# Patient Record
Sex: Male | Born: 1955 | Race: White | Hispanic: No | Marital: Single | Smoking: Current every day smoker
Health system: Southern US, Community
[De-identification: ages and names within clinical notes are randomized; demographics above are authoritative.]

## PROBLEM LIST (undated history)

## (undated) DIAGNOSIS — J449 Chronic obstructive pulmonary disease, unspecified: Secondary | ICD-10-CM

## (undated) DIAGNOSIS — R0602 Shortness of breath: Secondary | ICD-10-CM

## (undated) DIAGNOSIS — I10 Essential (primary) hypertension: Secondary | ICD-10-CM

## (undated) DIAGNOSIS — F1010 Alcohol abuse, uncomplicated: Secondary | ICD-10-CM

## (undated) DIAGNOSIS — K5792 Diverticulitis of intestine, part unspecified, without perforation or abscess without bleeding: Secondary | ICD-10-CM

## (undated) DIAGNOSIS — I739 Peripheral vascular disease, unspecified: Secondary | ICD-10-CM

## (undated) DIAGNOSIS — T884XXA Failed or difficult intubation, initial encounter: Secondary | ICD-10-CM

## (undated) DIAGNOSIS — H9190 Unspecified hearing loss, unspecified ear: Secondary | ICD-10-CM

## (undated) DIAGNOSIS — F1910 Other psychoactive substance abuse, uncomplicated: Secondary | ICD-10-CM

## (undated) DIAGNOSIS — E871 Hypo-osmolality and hyponatremia: Secondary | ICD-10-CM

## (undated) DIAGNOSIS — F419 Anxiety disorder, unspecified: Secondary | ICD-10-CM

## (undated) DIAGNOSIS — I2510 Atherosclerotic heart disease of native coronary artery without angina pectoris: Secondary | ICD-10-CM

## (undated) DIAGNOSIS — I469 Cardiac arrest, cause unspecified: Secondary | ICD-10-CM

## (undated) DIAGNOSIS — E46 Unspecified protein-calorie malnutrition: Secondary | ICD-10-CM

## (undated) DIAGNOSIS — Z720 Tobacco use: Secondary | ICD-10-CM

## (undated) DIAGNOSIS — Z8701 Personal history of pneumonia (recurrent): Secondary | ICD-10-CM

## (undated) DIAGNOSIS — C61 Malignant neoplasm of prostate: Secondary | ICD-10-CM

## (undated) HISTORY — PX: HERNIA REPAIR: SHX51

## (undated) HISTORY — PX: TRACHEOSTOMY CLOSURE: SHX458

## (undated) HISTORY — PX: COLON SURGERY: SHX602

---

## 2004-04-27 ENCOUNTER — Inpatient Hospital Stay (HOSPITAL_COMMUNITY): Admission: EM | Admit: 2004-04-27 | Discharge: 2004-04-29 | Payer: Self-pay | Admitting: Emergency Medicine

## 2006-09-12 ENCOUNTER — Emergency Department (HOSPITAL_COMMUNITY): Admission: EM | Admit: 2006-09-12 | Discharge: 2006-09-13 | Payer: Self-pay | Admitting: Emergency Medicine

## 2009-11-30 ENCOUNTER — Inpatient Hospital Stay (HOSPITAL_COMMUNITY): Admission: EM | Admit: 2009-11-30 | Discharge: 2009-12-02 | Payer: Self-pay | Admitting: Emergency Medicine

## 2011-01-10 LAB — DIFFERENTIAL
Basophils Relative: 0 % (ref 0–1)
Eosinophils Absolute: 0 10*3/uL (ref 0.0–0.7)
Lymphs Abs: 0.7 10*3/uL (ref 0.7–4.0)
Lymphs Abs: 0.8 10*3/uL (ref 0.7–4.0)
Monocytes Absolute: 1 10*3/uL (ref 0.1–1.0)
Monocytes Relative: 13 % — ABNORMAL HIGH (ref 3–12)
Neutro Abs: 5.9 10*3/uL (ref 1.7–7.7)
Neutro Abs: 6.7 10*3/uL (ref 1.7–7.7)
Neutrophils Relative %: 77 % (ref 43–77)
Neutrophils Relative %: 81 % — ABNORMAL HIGH (ref 43–77)

## 2011-01-10 LAB — POCT I-STAT, CHEM 8
BUN: 5 mg/dL — ABNORMAL LOW (ref 6–23)
Calcium, Ion: 1.07 mmol/L — ABNORMAL LOW (ref 1.12–1.32)
Chloride: 100 mEq/L (ref 96–112)
Creatinine, Ser: 0.9 mg/dL (ref 0.4–1.5)
Glucose, Bld: 142 mg/dL — ABNORMAL HIGH (ref 70–99)
HCT: 48 % (ref 39.0–52.0)
Potassium: 4.6 mEq/L (ref 3.5–5.1)

## 2011-01-10 LAB — CBC
HCT: 39.8 % (ref 39.0–52.0)
Hemoglobin: 13.7 g/dL (ref 13.0–17.0)
Hemoglobin: 15.2 g/dL (ref 13.0–17.0)
MCHC: 34.5 g/dL (ref 30.0–36.0)
MCHC: 34.8 g/dL (ref 30.0–36.0)
RBC: 4.13 MIL/uL — ABNORMAL LOW (ref 4.22–5.81)
RBC: 4.61 MIL/uL (ref 4.22–5.81)
WBC: 7.7 10*3/uL (ref 4.0–10.5)

## 2011-01-10 LAB — COMPREHENSIVE METABOLIC PANEL
ALT: 27 U/L (ref 0–53)
Alkaline Phosphatase: 55 U/L (ref 39–117)
BUN: 6 mg/dL (ref 6–23)
CO2: 29 mEq/L (ref 19–32)
Calcium: 8.7 mg/dL (ref 8.4–10.5)
GFR calc non Af Amer: >60 mL/min (ref >60)
Glucose, Bld: 124 mg/dL — ABNORMAL HIGH (ref 70–99)
Sodium: 135 mEq/L (ref 135–145)

## 2011-01-10 LAB — CULTURE, BLOOD (ROUTINE X 2)
Culture: NO GROWTH
Culture: NO GROWTH

## 2011-01-10 LAB — CULTURE, RESPIRATORY W GRAM STAIN: Culture: NORMAL

## 2011-01-10 LAB — EXPECTORATED SPUTUM ASSESSMENT W GRAM STAIN, RFLX TO RESP C

## 2011-01-10 LAB — MAGNESIUM: Magnesium: 2.1 mg/dL (ref 1.5–2.5)

## 2011-01-10 LAB — PHOSPHORUS: Phosphorus: 3.5 mg/dL (ref 2.3–4.6)

## 2011-03-09 NOTE — Op Note (Signed)
NAME:  Zachary Walls, Zachary Walls                            ACCOUNT NO.:  [id number]   MEDICAL RECORD NO.:  [id number]                   PATIENT TYPE:  INP   LOCATION:  1823                                 FACILITY:  [facility]   PHYSICIAN:  Gabrielle Dare. Janee Morn, M.D.             DATE OF BIRTH:  [birth year]   DATE OF PROCEDURE:  04/27/2004  DATE OF DISCHARGE:                                 OPERATIVE REPORT   PREOPERATIVE DIAGNOSIS:  Incarcerated recurrent left inguinal hernia.   POSTOPERATIVE DIAGNOSIS:  Incarcerated recurrent left inguinal hernia.   OPERATION PERFORMED:  Repair of incarcerated left recurrent left inguinal  hernia with mesh.   SURGEON:  Gabrielle Dare. Janee Morn, M.D.   ASSISTANT:  Donzetta Sprung, RNFA   ANESTHESIA:  General.   INDICATIONS FOR PROCEDURE:  The patient is a 55 year old white male with a  history of three left inguinal hernia repairs and two right inguinal hernia  repairs in the past, who complains over the past six months of intermittent  recurrent left inguinal hernia it popped out earlier today and became stuck.  He could not reduce it.  He presented to the emergency department with some  complaints of nausea and localized pain.  He was brought to the operating  room for emergent repair.   DESCRIPTION OF PROCEDURE:  Informed consent was obtained.  The patient  received intravenous antibiotics.  General anesthesia was administered.  His  abdomen and lower groin were prepped and draped in a sterile fashion.  A  left groin incision was made along his previous scar.  Subcutaneous tissues  were dissected down.  The external oblique fascia was divided.  Further  dissection revealed a matting of cord structures which were carefully  dissected, retracted out of the way as possible.  His vas deferens was  densely scarred and incorporated with the hernia sac necessitating its  sacrifice that was divided and ligated proximally and distally.  The sac was  further dissected and opened  revealing a small knuckle of viable small  bowel.  This was circumferentially dissected at the hernia neck and the neck  was widened allowing reduction of the hernia into the abdomen.  The fat  along the edge of the bowel was held with a Babcock.  This was further  cleared to a complete reduction back into the abdomen and the knuckle of  bowel was pulled back out and reinspected and was pink and nicely viable.  Once this was accomplished, the defect was then repaired.  There was some  wire still down in place from his previous repairs in the past.  The defect  was ovoid in shape about 1.5 cm long and 1 cm wide.  A plug of polypropylene  mesh was placed into the defect to prevent recurrence and secured in place  with 2-0 Prolene sutures.  Several interrupted 2-0 Prolene sutures were used  to tack the rim  of aponeurosis down to the scarred portion of the inguinal  ligament where the defect was.  This served to both secure the mesh into  place as it was incorporated in several of the stitches and to repair the  defect.  Once this was accomplished, the area was copiously irrigated.  0.25% Marcaine with epinephrine was used for local anesthetic.  The external  oblique fascia was closed with a series of interrupted 2-0 Vicryl sutures.  Subcutaneous tissues were reapproximated with a series of interrupted 3-0  Vicryl sutures.  The area was again irrigated and the skin was closed with 4-  0 Monocryl subcuticular stitch.  Benzoin and Steri-Strips and sterile  dressings were applied.  The patient's left testicle was checked and noted  to be in proper position in his scrotum without edema.  Please note  that prior to closure of the external oblique fascia the residual cord  structures were returned to anatomic position as possible.  The area was  very scarred down.  The patient tolerated the procedure well without  apparent complication and was taken to the recovery room in stable  condition.                                                Gabrielle Dare Janee Morn, M.D.    BET/MEDQ  D:  04/27/2004  T:  04/28/2004  Job:  [id number]
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
## 2011-04-09 ENCOUNTER — Emergency Department (HOSPITAL_COMMUNITY): Payer: Self-pay

## 2011-04-09 ENCOUNTER — Emergency Department (HOSPITAL_COMMUNITY)
Admission: EM | Admit: 2011-04-09 | Discharge: 2011-04-09 | Disposition: A | Discharge status: Home / Self Care | Payer: Self-pay | Attending: Emergency Medicine | Admitting: Emergency Medicine

## 2011-04-09 DIAGNOSIS — R413 Other amnesia: Secondary | ICD-10-CM | POA: Insufficient documentation

## 2011-04-09 DIAGNOSIS — S20219A Contusion of unspecified front wall of thorax, initial encounter: Secondary | ICD-10-CM | POA: Insufficient documentation

## 2011-04-09 DIAGNOSIS — R030 Elevated blood-pressure reading, without diagnosis of hypertension: Secondary | ICD-10-CM | POA: Insufficient documentation

## 2011-04-09 DIAGNOSIS — IMO0002 Reserved for concepts with insufficient information to code with codable children: Secondary | ICD-10-CM | POA: Insufficient documentation

## 2011-04-09 DIAGNOSIS — S0990XA Unspecified injury of head, initial encounter: Secondary | ICD-10-CM | POA: Insufficient documentation

## 2011-04-09 DIAGNOSIS — F1010 Alcohol abuse, uncomplicated: Secondary | ICD-10-CM | POA: Insufficient documentation

## 2011-04-09 DIAGNOSIS — S0180XA Unspecified open wound of other part of head, initial encounter: Secondary | ICD-10-CM | POA: Insufficient documentation

## 2011-04-09 DIAGNOSIS — R51 Headache: Secondary | ICD-10-CM | POA: Insufficient documentation

## 2011-04-09 DIAGNOSIS — M7989 Other specified soft tissue disorders: Secondary | ICD-10-CM | POA: Insufficient documentation

## 2011-04-09 DIAGNOSIS — J449 Chronic obstructive pulmonary disease, unspecified: Secondary | ICD-10-CM | POA: Insufficient documentation

## 2011-04-09 DIAGNOSIS — R079 Chest pain, unspecified: Secondary | ICD-10-CM | POA: Insufficient documentation

## 2011-04-09 DIAGNOSIS — M542 Cervicalgia: Secondary | ICD-10-CM | POA: Insufficient documentation

## 2011-04-09 DIAGNOSIS — J4489 Other specified chronic obstructive pulmonary disease: Secondary | ICD-10-CM | POA: Insufficient documentation

## 2011-05-12 ENCOUNTER — Emergency Department (HOSPITAL_COMMUNITY): Payer: Medicaid Other

## 2011-05-12 ENCOUNTER — Inpatient Hospital Stay (HOSPITAL_COMMUNITY)
Admission: EM | Admit: 2011-05-12 | Discharge: 2011-05-16 | DRG: 193 | Disposition: A | Discharge status: Home Health | Payer: Medicaid Other | Attending: Internal Medicine | Admitting: Internal Medicine

## 2011-05-12 DIAGNOSIS — H6690 Otitis media, unspecified, unspecified ear: Secondary | ICD-10-CM | POA: Diagnosis present

## 2011-05-12 DIAGNOSIS — H9190 Unspecified hearing loss, unspecified ear: Secondary | ICD-10-CM | POA: Diagnosis present

## 2011-05-12 DIAGNOSIS — H6120 Impacted cerumen, unspecified ear: Secondary | ICD-10-CM | POA: Diagnosis present

## 2011-05-12 DIAGNOSIS — Z79899 Other long term (current) drug therapy: Secondary | ICD-10-CM

## 2011-05-12 DIAGNOSIS — E871 Hypo-osmolality and hyponatremia: Secondary | ICD-10-CM | POA: Diagnosis present

## 2011-05-12 DIAGNOSIS — Z23 Encounter for immunization: Secondary | ICD-10-CM

## 2011-05-12 DIAGNOSIS — F17200 Nicotine dependence, unspecified, uncomplicated: Secondary | ICD-10-CM | POA: Diagnosis present

## 2011-05-12 DIAGNOSIS — J9620 Acute and chronic respiratory failure, unspecified whether with hypoxia or hypercapnia: Secondary | ICD-10-CM | POA: Diagnosis present

## 2011-05-12 DIAGNOSIS — M109 Gout, unspecified: Secondary | ICD-10-CM | POA: Diagnosis present

## 2011-05-12 DIAGNOSIS — E46 Unspecified protein-calorie malnutrition: Secondary | ICD-10-CM | POA: Diagnosis present

## 2011-05-12 DIAGNOSIS — I10 Essential (primary) hypertension: Secondary | ICD-10-CM | POA: Diagnosis present

## 2011-05-12 DIAGNOSIS — J439 Emphysema, unspecified: Secondary | ICD-10-CM | POA: Diagnosis present

## 2011-05-12 DIAGNOSIS — J189 Pneumonia, unspecified organism: Principal | ICD-10-CM | POA: Diagnosis present

## 2011-05-12 LAB — BASIC METABOLIC PANEL
CO2: 25 mEq/L (ref 19–32)
Calcium: 9.5 mg/dL (ref 8.4–10.5)
Chloride: 89 mEq/L — ABNORMAL LOW (ref 96–112)
Potassium: 4.2 mEq/L (ref 3.5–5.1)
Sodium: 127 mEq/L — ABNORMAL LOW (ref 135–145)

## 2011-05-12 LAB — CBC
Platelets: 200 10*3/uL (ref 150–400)
RBC: 4.8 MIL/uL (ref 4.22–5.81)
WBC: 17.6 10*3/uL — ABNORMAL HIGH (ref 4.0–10.5)

## 2011-05-12 LAB — STREP PNEUMONIAE URINARY ANTIGEN: Strep Pneumo Urinary Antigen: NEGATIVE

## 2011-05-12 LAB — LACTIC ACID, PLASMA: Lactic Acid, Venous: 1 mmol/L (ref 0.5–2.2)

## 2011-05-13 LAB — CBC
HCT: 41.5 % (ref 39.0–52.0)
Hemoglobin: 14.7 g/dL (ref 13.0–17.0)
MCV: 90.2 fL (ref 78.0–100.0)
RDW: 13.2 % (ref 11.5–15.5)
WBC: 16 10*3/uL — ABNORMAL HIGH (ref 4.0–10.5)

## 2011-05-13 LAB — BASIC METABOLIC PANEL
BUN: 9 mg/dL (ref 6–23)
Chloride: 93 mEq/L — ABNORMAL LOW (ref 96–112)
Creatinine, Ser: 0.65 mg/dL (ref 0.50–1.35)
GFR calc Af Amer: >60 mL/min (ref >60)
Glucose, Bld: 111 mg/dL — ABNORMAL HIGH (ref 70–99)
Potassium: 4 mEq/L (ref 3.5–5.1)

## 2011-05-14 ENCOUNTER — Inpatient Hospital Stay (HOSPITAL_COMMUNITY): Payer: Medicaid Other

## 2011-05-14 LAB — BASIC METABOLIC PANEL
CO2: 33 mEq/L — ABNORMAL HIGH (ref 19–32)
Calcium: 9.4 mg/dL (ref 8.4–10.5)
Chloride: 93 mEq/L — ABNORMAL LOW (ref 96–112)
Glucose, Bld: 118 mg/dL — ABNORMAL HIGH (ref 70–99)
Potassium: 3.9 mEq/L (ref 3.5–5.1)
Sodium: 135 mEq/L (ref 135–145)

## 2011-05-14 LAB — CBC
Hemoglobin: 14.5 g/dL (ref 13.0–17.0)
Platelets: 229 10*3/uL (ref 150–400)
RBC: 4.65 MIL/uL (ref 4.22–5.81)
WBC: 11.8 10*3/uL — ABNORMAL HIGH (ref 4.0–10.5)

## 2011-05-15 LAB — CBC
HCT: 40.9 % (ref 39.0–52.0)
Hemoglobin: 14.2 g/dL (ref 13.0–17.0)
MCH: 31.6 pg (ref 26.0–34.0)
MCV: 91.1 fL (ref 78.0–100.0)
Platelets: 273 10*3/uL (ref 150–400)
RBC: 4.49 MIL/uL (ref 4.22–5.81)
WBC: 12.5 10*3/uL — ABNORMAL HIGH (ref 4.0–10.5)

## 2011-05-15 LAB — CULTURE, RESPIRATORY W GRAM STAIN: Culture: NORMAL

## 2011-05-15 LAB — COMPREHENSIVE METABOLIC PANEL
AST: 24 U/L (ref 0–37)
CO2: 35 mEq/L — ABNORMAL HIGH (ref 19–32)
Chloride: 90 mEq/L — ABNORMAL LOW (ref 96–112)
Creatinine, Ser: 0.7 mg/dL (ref 0.50–1.35)
GFR calc Af Amer: >60 mL/min (ref >60)
GFR calc non Af Amer: >60 mL/min (ref >60)
Glucose, Bld: 117 mg/dL — ABNORMAL HIGH (ref 70–99)
Total Bilirubin: 0.3 mg/dL (ref 0.3–1.2)

## 2011-05-15 MED ORDER — IOHEXOL 300 MG/ML  SOLN
100.0000 mL | Freq: Once | INTRAMUSCULAR | Status: AC | PRN
  Administered 2011-05-15: 100 mL via INTRAVENOUS

## 2011-05-16 LAB — BASIC METABOLIC PANEL
CO2: 33 mEq/L — ABNORMAL HIGH (ref 19–32)
Calcium: 10 mg/dL (ref 8.4–10.5)
GFR calc non Af Amer: >60 mL/min (ref >60)
Potassium: 4.1 mEq/L (ref 3.5–5.1)
Sodium: 131 mEq/L — ABNORMAL LOW (ref 135–145)

## 2011-05-16 LAB — CBC
MCH: 30.5 pg (ref 26.0–34.0)
MCHC: 34 g/dL (ref 30.0–36.0)
Platelets: 297 10*3/uL (ref 150–400)
RBC: 4.53 MIL/uL (ref 4.22–5.81)
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
## 2011-11-24 ENCOUNTER — Encounter (HOSPITAL_COMMUNITY): Payer: Self-pay | Admitting: *Deleted

## 2011-11-24 ENCOUNTER — Other Ambulatory Visit: Payer: Self-pay

## 2011-11-24 ENCOUNTER — Inpatient Hospital Stay (HOSPITAL_COMMUNITY)
Admission: EM | Admit: 2011-11-24 | Discharge: 2011-12-26 | DRG: 329 | Disposition: A | Discharge status: Skilled Nursing Facility | Payer: Medicaid Other | Attending: General Surgery | Admitting: General Surgery

## 2011-11-24 ENCOUNTER — Emergency Department (HOSPITAL_COMMUNITY): Payer: Medicaid Other

## 2011-11-24 DIAGNOSIS — R112 Nausea with vomiting, unspecified: Secondary | ICD-10-CM | POA: Diagnosis present

## 2011-11-24 DIAGNOSIS — E875 Hyperkalemia: Secondary | ICD-10-CM | POA: Diagnosis not present

## 2011-11-24 DIAGNOSIS — K5792 Diverticulitis of intestine, part unspecified, without perforation or abscess without bleeding: Secondary | ICD-10-CM

## 2011-11-24 DIAGNOSIS — K560 Paralytic ileus: Secondary | ICD-10-CM | POA: Diagnosis present

## 2011-11-24 DIAGNOSIS — K5720 Diverticulitis of large intestine with perforation and abscess without bleeding: Secondary | ICD-10-CM

## 2011-11-24 DIAGNOSIS — K5732 Diverticulitis of large intestine without perforation or abscess without bleeding: Principal | ICD-10-CM | POA: Diagnosis present

## 2011-11-24 DIAGNOSIS — K56609 Unspecified intestinal obstruction, unspecified as to partial versus complete obstruction: Secondary | ICD-10-CM | POA: Diagnosis present

## 2011-11-24 DIAGNOSIS — J449 Chronic obstructive pulmonary disease, unspecified: Secondary | ICD-10-CM | POA: Diagnosis present

## 2011-11-24 DIAGNOSIS — D62 Acute posthemorrhagic anemia: Secondary | ICD-10-CM | POA: Diagnosis not present

## 2011-11-24 DIAGNOSIS — K651 Peritoneal abscess: Secondary | ICD-10-CM | POA: Diagnosis present

## 2011-11-24 DIAGNOSIS — E43 Unspecified severe protein-calorie malnutrition: Secondary | ICD-10-CM | POA: Diagnosis present

## 2011-11-24 DIAGNOSIS — E46 Unspecified protein-calorie malnutrition: Secondary | ICD-10-CM | POA: Diagnosis present

## 2011-11-24 DIAGNOSIS — J4489 Other specified chronic obstructive pulmonary disease: Secondary | ICD-10-CM | POA: Diagnosis present

## 2011-11-24 DIAGNOSIS — K660 Peritoneal adhesions (postprocedural) (postinfection): Secondary | ICD-10-CM | POA: Diagnosis present

## 2011-11-24 DIAGNOSIS — E876 Hypokalemia: Secondary | ICD-10-CM | POA: Diagnosis not present

## 2011-11-24 DIAGNOSIS — Z681 Body mass index (BMI) 19 or less, adult: Secondary | ICD-10-CM

## 2011-11-24 DIAGNOSIS — R5381 Other malaise: Secondary | ICD-10-CM | POA: Diagnosis present

## 2011-11-24 DIAGNOSIS — K631 Perforation of intestine (nontraumatic): Secondary | ICD-10-CM

## 2011-11-24 DIAGNOSIS — K630 Abscess of intestine: Secondary | ICD-10-CM

## 2011-11-24 DIAGNOSIS — H9190 Unspecified hearing loss, unspecified ear: Secondary | ICD-10-CM | POA: Diagnosis present

## 2011-11-24 DIAGNOSIS — I10 Essential (primary) hypertension: Secondary | ICD-10-CM | POA: Diagnosis present

## 2011-11-24 HISTORY — DX: Essential (primary) hypertension: I10

## 2011-11-24 HISTORY — DX: Chronic obstructive pulmonary disease, unspecified: J44.9

## 2011-11-24 HISTORY — DX: Unspecified hearing loss, unspecified ear: H91.90

## 2011-11-24 LAB — COMPREHENSIVE METABOLIC PANEL
ALT: 10 U/L (ref 0–53)
AST: 18 U/L (ref 0–37)
Albumin: 3.6 g/dL (ref 3.5–5.2)
Alkaline Phosphatase: 82 U/L (ref 39–117)
Chloride: 89 mEq/L — ABNORMAL LOW (ref 96–112)
Potassium: 3.9 mEq/L (ref 3.5–5.1)
Sodium: 128 mEq/L — ABNORMAL LOW (ref 135–145)
Total Protein: 8.4 g/dL — ABNORMAL HIGH (ref 6.0–8.3)

## 2011-11-24 LAB — CBC
HCT: 50.8 % (ref 39.0–52.0)
Hemoglobin: 17.9 g/dL — ABNORMAL HIGH (ref 13.0–17.0)
MCH: 31.4 pg (ref 26.0–34.0)
MCHC: 35.2 g/dL (ref 30.0–36.0)
MCV: 89.1 fL (ref 78.0–100.0)

## 2011-11-24 LAB — URINALYSIS, ROUTINE W REFLEX MICROSCOPIC
Hgb urine dipstick: NEGATIVE
Specific Gravity, Urine: 1.023 (ref 1.005–1.030)
Urobilinogen, UA: 1 mg/dL (ref 0.0–1.0)

## 2011-11-24 LAB — DIFFERENTIAL
Basophils Relative: 0 % (ref 0–1)
Eosinophils Absolute: 0 10*3/uL (ref 0.0–0.7)
Eosinophils Relative: 0 % (ref 0–5)
Monocytes Absolute: 0.2 10*3/uL (ref 0.1–1.0)
Monocytes Relative: 5 % (ref 3–12)
Neutro Abs: 3.3 10*3/uL (ref 1.7–7.7)

## 2011-11-24 LAB — URINE MICROSCOPIC-ADD ON

## 2011-11-24 MED ORDER — CIPROFLOXACIN IN D5W 400 MG/200ML IV SOLN
400.0000 mg | Freq: Once | INTRAVENOUS | Status: AC
  Administered 2011-11-24: 400 mg via INTRAVENOUS
  Filled 2011-11-24: qty 200

## 2011-11-24 MED ORDER — MORPHINE SULFATE 4 MG/ML IJ SOLN
4.0000 mg | Freq: Once | INTRAMUSCULAR | Status: AC
  Administered 2011-11-24: 4 mg via INTRAVENOUS
  Filled 2011-11-24: qty 1

## 2011-11-24 MED ORDER — CIPROFLOXACIN IN D5W 400 MG/200ML IV SOLN
400.0000 mg | Freq: Two times a day (BID) | INTRAVENOUS | Status: DC
  Administered 2011-11-24 – 2011-12-11 (×33): 400 mg via INTRAVENOUS
  Filled 2011-11-24 (×36): qty 200

## 2011-11-24 MED ORDER — FENTANYL CITRATE 0.05 MG/ML IJ SOLN
100.0000 ug | Freq: Once | INTRAMUSCULAR | Status: AC
  Administered 2011-11-24: 100 ug via INTRAVENOUS
  Filled 2011-11-24: qty 2

## 2011-11-24 MED ORDER — SODIUM CHLORIDE 0.9 % IV SOLN
Freq: Once | INTRAVENOUS | Status: AC
  Administered 2011-11-24: 05:00:00 via INTRAVENOUS

## 2011-11-24 MED ORDER — ALBUTEROL SULFATE (5 MG/ML) 0.5% IN NEBU
2.5000 mg | INHALATION_SOLUTION | RESPIRATORY_TRACT | Status: DC | PRN
  Administered 2011-11-25 (×2): 2.5 mg via RESPIRATORY_TRACT
  Filled 2011-11-24 (×2): qty 0.5

## 2011-11-24 MED ORDER — ONDANSETRON HCL 4 MG/2ML IJ SOLN
4.0000 mg | Freq: Once | INTRAMUSCULAR | Status: AC
  Administered 2011-11-24: 4 mg via INTRAVENOUS
  Filled 2011-11-24: qty 2

## 2011-11-24 MED ORDER — ONDANSETRON HCL 4 MG/2ML IJ SOLN
4.0000 mg | Freq: Once | INTRAMUSCULAR | Status: AC
Start: 2011-11-24 — End: 2011-11-24
  Administered 2011-11-24: 4 mg via INTRAVENOUS
  Filled 2011-11-24: qty 2

## 2011-11-24 MED ORDER — POTASSIUM CHLORIDE IN NACL 20-0.9 MEQ/L-% IV SOLN
INTRAVENOUS | Status: AC
  Administered 2011-11-24: 125 mL/h via INTRAVENOUS
  Administered 2011-11-25 – 2011-11-26 (×5): via INTRAVENOUS
  Administered 2011-11-27: 125 mL/h via INTRAVENOUS
  Administered 2011-11-27 – 2011-11-28 (×3): via INTRAVENOUS
  Filled 2011-11-24 (×15): qty 1000

## 2011-11-24 MED ORDER — METRONIDAZOLE IN NACL 5-0.79 MG/ML-% IV SOLN
500.0000 mg | Freq: Three times a day (TID) | INTRAVENOUS | Status: DC
  Administered 2011-11-24 – 2011-12-11 (×49): 500 mg via INTRAVENOUS
  Filled 2011-11-24 (×53): qty 100

## 2011-11-24 MED ORDER — ONDANSETRON HCL 4 MG/2ML IJ SOLN
4.0000 mg | Freq: Four times a day (QID) | INTRAMUSCULAR | Status: DC | PRN
  Administered 2011-11-27 – 2011-12-26 (×20): 4 mg via INTRAVENOUS
  Filled 2011-11-24 (×9): qty 2
  Filled 2011-11-24: qty 8
  Filled 2011-11-24 (×21): qty 2

## 2011-11-24 MED ORDER — MORPHINE SULFATE 2 MG/ML IJ SOLN
2.0000 mg | INTRAMUSCULAR | Status: DC | PRN
  Administered 2011-11-24: 2 mg via INTRAVENOUS
  Administered 2011-11-24: 4 mg via INTRAVENOUS
  Administered 2011-11-24: 2 mg via INTRAVENOUS
  Administered 2011-11-25 (×3): 4 mg via INTRAVENOUS
  Administered 2011-11-25 (×2): 2 mg via INTRAVENOUS
  Administered 2011-11-25 – 2011-11-26 (×7): 4 mg via INTRAVENOUS
  Administered 2011-11-26: 2 mg via INTRAVENOUS
  Administered 2011-11-26 (×2): 4 mg via INTRAVENOUS
  Administered 2011-11-26: 2 mg via INTRAVENOUS
  Administered 2011-11-27 – 2011-11-28 (×9): 4 mg via INTRAVENOUS
  Administered 2011-11-28: 2 mg via INTRAVENOUS
  Administered 2011-11-28: 4 mg via INTRAVENOUS
  Administered 2011-11-28: 2 mg via INTRAVENOUS
  Filled 2011-11-24 (×2): qty 2
  Filled 2011-11-24: qty 1
  Filled 2011-11-24: qty 2
  Filled 2011-11-24: qty 1
  Filled 2011-11-24 (×3): qty 2
  Filled 2011-11-24: qty 1
  Filled 2011-11-24: qty 2
  Filled 2011-11-24: qty 1
  Filled 2011-11-24 (×3): qty 2
  Filled 2011-11-24: qty 1
  Filled 2011-11-24 (×4): qty 2
  Filled 2011-11-24: qty 1
  Filled 2011-11-24 (×5): qty 2
  Filled 2011-11-24: qty 1
  Filled 2011-11-24 (×2): qty 2
  Filled 2011-11-24: qty 1
  Filled 2011-11-24 (×2): qty 2

## 2011-11-24 MED ORDER — ENOXAPARIN SODIUM 40 MG/0.4ML ~~LOC~~ SOLN
40.0000 mg | SUBCUTANEOUS | Status: DC
  Administered 2011-11-25 – 2011-12-01 (×7): 40 mg via SUBCUTANEOUS
  Filled 2011-11-24 (×9): qty 0.4

## 2011-11-24 MED ORDER — METRONIDAZOLE IN NACL 5-0.79 MG/ML-% IV SOLN
500.0000 mg | Freq: Once | INTRAVENOUS | Status: AC
  Administered 2011-11-24: 500 mg via INTRAVENOUS
  Filled 2011-11-24: qty 100

## 2011-11-24 MED ORDER — IOHEXOL 300 MG/ML  SOLN
100.0000 mL | Freq: Once | INTRAMUSCULAR | Status: AC | PRN
  Administered 2011-11-24: 100 mL via INTRAVENOUS

## 2011-11-24 MED ORDER — DEXTROSE 5 % IV SOLN
1.0000 g | Freq: Once | INTRAVENOUS | Status: AC
  Administered 2011-11-24: 1 g via INTRAVENOUS
  Filled 2011-11-24: qty 10

## 2011-11-24 MED ORDER — ALBUTEROL SULFATE (5 MG/ML) 0.5% IN NEBU
INHALATION_SOLUTION | RESPIRATORY_TRACT | Status: AC
  Administered 2011-11-25: 2.5 mg via RESPIRATORY_TRACT
  Filled 2011-11-24: qty 0.5

## 2011-11-24 NOTE — ED Provider Notes (Signed)
Pt signed out by Dr Effie Shy to check ct when back, that he had reduced pts inguinal hernia already, but was concerned RE lower abd pain/tenderness.  CT results noted. Acute diverticulitis/large diverticular abscess causing SBO, bil ing hernias, left w loop SB. NG tube to liws. IV ns bolus. Iv abx. Gen surgery called - discussed w Dr Biagio Quint - will see in ed/admit.   Discussed ct and tx/admit plan w pt.   Suzi Roots, MD 11/24/11 [id number]

## 2011-11-24 NOTE — ED Notes (Signed)
Attempted NG tube insertion X 1-pt refused 2nd attempt-MD informed

## 2011-11-24 NOTE — ED Notes (Signed)
ngt attempted by Patty rn unsuccessful at this time.

## 2011-11-24 NOTE — H&P (Signed)
Reason for Consult:diverticulitis and bowel obstruction Referring Physician: Khadir Roam is an 56 y.o. male.  HPI: this patient presents today for evaluation of a one-week history of nausea vomiting and abdominal pain. He states that approximately 2 weeks ago he began having flulike symptoms of not feeling well but over the last 2 months has had chronic respiratory problems after a recent pneumonia in November. He was self treating with over-the-counter remedies and approximately 3 days ago began having left lower quadrant abdominal pain which he describes as very sharp. He did get some relief of his abdominal pain with passing gas. Yesterday he began having nausea and vomiting and abdominal distention and states that he cannot even keep down water. He has had some flatus and small bowel movement this morning. He's had some fevers and chills although he is not recorded a temperature. He states that he has had some blood in the stools although he was Hemoccult negative in the emergency room today he denies any prior history of a colonoscopy.  Past Medical History  Diagnosis Date  . COPD (chronic obstructive pulmonary disease)   . Pneumonia   . Hypertension     Past Surgical History  Procedure Date  . Hernia repair     History reviewed. No pertinent family history.  Social History:  reports that he quit smoking about 3 weeks ago. He has never used smokeless tobacco. He reports that he drinks alcohol. He reports that he does not use illicit drugs.  Allergies:  Allergies  Allergen Reactions  . Codeine Rash    Medications: I have reviewed the patient's current medications.  Results for orders placed during the hospital encounter of 11/24/11 (from the past 48 hour(s))  COMPREHENSIVE METABOLIC PANEL     Status: Abnormal   Collection Time   11/24/11  5:00 AM      Component Value Range Comment   Sodium 128 (*) 135 - 145 (mEq/L)    Potassium 3.9  3.5 - 5.1 (mEq/L)    Chloride 89  (*) 96 - 112 (mEq/L)    CO2 26  19 - 32 (mEq/L)    Glucose, Bld 130 (*) 70 - 99 (mg/dL)    BUN 9  6 - 23 (mg/dL)    Creatinine, Ser 1.61  0.50 - 1.35 (mg/dL)    Calcium 09.6  8.4 - 10.5 (mg/dL)    Total Protein 8.4 (*) 6.0 - 8.3 (g/dL)    Albumin 3.6  3.5 - 5.2 (g/dL)    AST 18  0 - 37 (U/L)    ALT 10  0 - 53 (U/L)    Alkaline Phosphatase 82  39 - 117 (U/L)    Total Bilirubin 0.9  0.3 - 1.2 (mg/dL)    GFR calc non Af Amer >90  >90 (mL/min)    GFR calc Af Amer >90  >90 (mL/min)   LIPASE, BLOOD     Status: Normal   Collection Time   11/24/11  5:00 AM      Component Value Range Comment   Lipase 15  11 - 59 (U/L)   CBC     Status: Abnormal   Collection Time   11/24/11  5:00 AM      Component Value Range Comment   WBC 4.4  4.0 - 10.5 (K/uL)    RBC 5.70  4.22 - 5.81 (MIL/uL)    Hemoglobin 17.9 (*) 13.0 - 17.0 (g/dL)    HCT 04.5  40.9 - 81.1 (%)  MCV 89.1  78.0 - 100.0 (fL)    MCH 31.4  26.0 - 34.0 (pg)    MCHC 35.2  30.0 - 36.0 (g/dL)    RDW 16.1  09.6 - 04.5 (%)    Platelets 230  150 - 400 (K/uL)   DIFFERENTIAL     Status: Normal   Collection Time   11/24/11  5:00 AM      Component Value Range Comment   Neutrophils Relative 74  43 - 77 (%)    Neutro Abs 3.3  1.7 - 7.7 (K/uL)    Lymphocytes Relative 21  12 - 46 (%)    Lymphs Abs 0.9  0.7 - 4.0 (K/uL)    Monocytes Relative 5  3 - 12 (%)    Monocytes Absolute 0.2  0.1 - 1.0 (K/uL)    Eosinophils Relative 0  0 - 5 (%)    Eosinophils Absolute 0.0  0.0 - 0.7 (K/uL)    Basophils Relative 0  0 - 1 (%)    Basophils Absolute 0.0  0.0 - 0.1 (K/uL)   URINALYSIS, ROUTINE W REFLEX MICROSCOPIC     Status: Abnormal   Collection Time   11/24/11  7:02 AM      Component Value Range Comment   Color, Urine ORANGE (*) YELLOW  BIOCHEMICALS MAY BE AFFECTED BY COLOR   APPearance CLOUDY (*) CLEAR     Specific Gravity, Urine 1.023  1.005 - 1.030     pH 5.5  5.0 - 8.0     Glucose, UA NEGATIVE  NEGATIVE (mg/dL)    Hgb urine dipstick NEGATIVE  NEGATIVE      Bilirubin Urine SMALL (*) NEGATIVE     Ketones, ur TRACE (*) NEGATIVE (mg/dL)    Protein, ur 409 (*) NEGATIVE (mg/dL)    Urobilinogen, UA 1.0  0.0 - 1.0 (mg/dL)    Nitrite POSITIVE (*) NEGATIVE     Leukocytes, UA SMALL (*) NEGATIVE    URINE MICROSCOPIC-ADD ON     Status: Abnormal   Collection Time   11/24/11  7:02 AM      Component Value Range Comment   WBC, UA 0-2  <3 (WBC/hpf)    Bacteria, UA FEW (*) RARE     Casts HYALINE CASTS (*) NEGATIVE     Urine-Other MUCOUS PRESENT     OCCULT BLOOD, POC DEVICE     Status: Normal   Collection Time   11/24/11  7:14 AM      Component Value Range Comment   Fecal Occult Bld NEGATIVE       Ct Abdomen Pelvis W Contrast  11/24/2011  *RADIOLOGY REPORT*  Clinical Data: Abdominal pain, diarrhea, anorexia.Small bowel obstruction on abdominal radiographs.  CT ABDOMEN AND PELVIS WITH CONTRAST  Technique:  Multidetector CT imaging of the abdomen and pelvis was performed following the standard protocol during bolus administration of intravenous contrast.  Contrast: OMNIPAQUE IOHEXOL 300 MG/ML IV SOLN  Comparison: None.  Findings: Diverticulosis and moderate wall thickening is seen involving the sigmoid colon, consistent with diverticulitis.  Dilated small bowel loops are seen with a transition point in the left lower quadrant adjacent to the inflamed sigmoid colon.  This is consistent with a distal small bowel obstruction secondary to inflammatory changes from diverticulitis.  A small rim enhancing fluid collection containing an air bubble is seen in the left anterior pelvis between the anterior abdominal wall muscles and bladder which measures 3.3 x 7.4 cm.  This is consistent with abscess.  Small amount  of free fluid is noted in the pelvic cul-de-sac.  Small bilateral inguinal hernias are seen containing fat on the right and a nondilated small bowel loop on the left.  The abdominal parenchymal organs are normal in appearance.  No evidence of hydronephrosis.   Small hiatal hernia.  No soft tissue masses or lymphadenopathy identified.  A small amount of ascites is also seen in both the right and left upper quadrant.  IMPRESSION:  1.  Moderate sigmoid diverticulitis. 2. Diverticular abscess in the anterior left pelvis measuring 3.3 x 7.4 cm. 3.  Distal small bowel obstruction with transition point in the left lower quadrant, secondary to diverticulitis and abscess 4.  Mild ascites. 5.  Small bilateral inguinal hernias containing fat on the right and a small bowel loop on the left. 5.  Small hiatal hernia.  Original Report Authenticated By: Danae Orleans, M.D.   Dg Abd Acute W/chest  11/24/2011  *RADIOLOGY REPORT*  Clinical Data: Lower abdominal pain, history of hernia repairs.  ACUTE ABDOMEN SERIES (ABDOMEN 2 VIEW & CHEST 1 VIEW)  Comparison: None.  Findings: Lungs are clear.  There are dilated loops of small bowel, measuring up to 3.7 cm.  Relative paucity of distal bowel gas.  No free intraperitoneal air identified.  Organ outlines normal where seen. No acute osseous abnormality.  IMPRESSION: Dilated small bowel loops and relative paucity of bowel gas distally.  Small bowel obstruction not excluded.  Consider CT to further evaluate.  Original Report Authenticated By: Waneta Martins, M.D.    @ROS @ Blood pressure 140/71, pulse 95, temperature 101.1 F (38.4 C), temperature source Oral, resp. rate 14, SpO2 97.00%. General appearance: alert, cooperative and no distress Head: Normocephalic, without obvious abnormality, atraumatic Ears: right hearing aid Neck: no JVD and supple, symmetrical, trachea midline Resp: clear to auscultation bilaterally Chest wall: no tenderness Cardio: HR 97, regular rhythm GI: soft, bilateral lower abdominal tenderness, LLQ greatest, WHSS in midline, reducible inguinal hernias, mild distension, no diffuse peritonitis Extremities: extremities normal, atraumatic, no cyanosis or edema Pulses: 2+ and symmetric Skin: Skin color,  texture, turgor normal. No rashes or lesions Neurologic: Grossly normal  Assessment/Plan: Diverticulitis with abscess and likely associated small bowel obstruction.  And this is likely due to diverticulitis and a diverticular abscess. I think a small bowel obstruction is likely reactive and is associated with ileus. He has no evidence of free perforation or peritonitis and I think it's reasonable to admit the patient and maintain n.p.o. With IV hydration. Will continue IV antibiotics and will ask interventional radiology to place a drain in this diverticular abscess. Hopefully this will relieve his symptoms and allow [place] to get through this with medical management. He will likely need a sigmoid colectomy given this episode copy to diverticulitis in the future. Also recommended NG tube for decompression and relief of his abdominal distention and symptoms.  Lodema Pilot DAVID 11/24/2011, 11:42 AM

## 2011-11-24 NOTE — ED Provider Notes (Signed)
History     CSN: [id number]  Arrival date & time 11/24/11  5366   First MD Initiated Contact with Patient 11/24/11 0541      Chief Complaint  Patient presents with  . Abdominal Pain    (Consider location/radiation/quality/duration/timing/severity/associated sxs/prior treatment) HPI Zachary Walls is a 56 y.o. male presents with c/o abdominal pain leading to desire to be assessed in the ED. The sx(s) have been present for 3 days. Additional concerns are , anorexia, diarrhea , and right groin swelling. Causative factors are not known. Palliative factors are nothing has helped. The distress associated is moderate. The disorder has been present for 3 days.    Past Medical History  Diagnosis Date  . COPD (chronic obstructive pulmonary disease)   . Pneumonia   . Hypertension     Past Surgical History  Procedure Date  . Hernia repair     History reviewed. No pertinent family history.  History  Substance Use Topics  . Smoking status: Former Smoker    Quit date: 11/03/2011  . Smokeless tobacco: Never Used  . Alcohol Use: Yes     occasionally      Review of Systems  All other systems reviewed and are negative.    Allergies  Codeine  Home Medications   Current Outpatient Rx  Name Route Sig Dispense Refill  . IBUPROFEN 800 MG PO TABS Oral Take 800 mg by mouth every 8 (eight) hours as needed.      BP 156/77  Pulse 108  Temp(Src) 98.6 F (37 C) (Oral)  Resp 14  SpO2 95%  Physical Exam  Nursing note and vitals reviewed. Constitutional: He is oriented to person, place, and time. He appears well-developed and well-nourished.  HENT:  Head: Normocephalic and atraumatic.  Right Ear: External ear normal.  Left Ear: External ear normal.  Eyes: Conjunctivae and EOM are normal. Pupils are equal, round, and reactive to light.  Neck: Normal range of motion and phonation normal. Neck supple.  Cardiovascular: Normal rate, regular rhythm, normal heart sounds and intact  distal pulses.   Pulmonary/Chest: Effort normal and breath sounds normal. He exhibits no bony tenderness.  Abdominal: Soft. Normal appearance. He exhibits no distension. There is tenderness (Mild, diffuse.).       Hyperactive bowel sounds.  Genitourinary:       Rectal exam reveals thin, brown stool, somewhat enlarged prostate without nodularity, no rectal mass.  Musculoskeletal: Normal range of motion.  Neurological: He is alert and oriented to person, place, and time. He has normal strength. No cranial nerve deficit or sensory deficit. He exhibits normal muscle tone. Coordination normal.  Skin: Skin is warm, dry and intact.  Psychiatric: He has a normal mood and affect. His behavior is normal. Judgment and thought content normal.    ED Course  Procedures (including critical care time)   Date: 11/24/2011  Rate: 107   Rhythm: sinus tachycardia  QRS Axis: right axis  Intervals: normal  ST/T Wave abnormalities: normal  Conduction Disutrbances:none  Narrative Interpretation: biatrial enlargement  Old EKG Reviewed: none available  7:59 AM Reevaluation with update and discussion. After initial assessment and treatment, an updated evaluation reveals after ice applied to right groin hernia was reduced. A three-way abdomen is remarkable for possible early small bowel obstruction.  Patient had persistent abdominal pain, so CT scan was ordered. Rocephin ordered for possible UTI. Care to Dr Denton Lank. Zachary Walls    Labs Reviewed  COMPREHENSIVE METABOLIC PANEL - Abnormal; Notable for the following:  Sodium 128 (*)    Chloride 89 (*)    Glucose, Bld 130 (*)    Total Protein 8.4 (*)    All other components within normal limits  CBC - Abnormal; Notable for the following:    Hemoglobin 17.9 (*)    All other components within normal limits  URINALYSIS, ROUTINE W REFLEX MICROSCOPIC - Abnormal; Notable for the following:    Color, Urine ORANGE (*) BIOCHEMICALS MAY BE AFFECTED BY COLOR    APPearance CLOUDY (*)    Bilirubin Urine SMALL (*)    Ketones, ur TRACE (*)    Protein, ur 100 (*)    Nitrite POSITIVE (*)    Leukocytes, UA SMALL (*)    All other components within normal limits  URINE MICROSCOPIC-ADD ON - Abnormal; Notable for the following:    Bacteria, UA FEW (*)    Casts HYALINE CASTS (*)    All other components within normal limits  LIPASE, BLOOD  DIFFERENTIAL  OCCULT BLOOD, POC DEVICE  OCCULT BLOOD X 1 CARD TO LAB, STOOL   Dg Abd Acute W/chest  11/24/2011  *RADIOLOGY REPORT*  Clinical Data: Lower abdominal pain, history of hernia repairs.  ACUTE ABDOMEN SERIES (ABDOMEN 2 VIEW & CHEST 1 VIEW)  Comparison: None.  Findings: Lungs are clear.  There are dilated loops of small bowel, measuring up to 3.7 cm.  Relative paucity of distal bowel gas.  No free intraperitoneal air identified.  Organ outlines normal where seen. No acute osseous abnormality.  IMPRESSION: Dilated small bowel loops and relative paucity of bowel gas distally.  Small bowel obstruction not excluded.  Consider CT to further evaluate.  Original Report Authenticated By: Waneta Martins, M.D.     No diagnosis found.    MDM  Abdominal pain, rule out small bowel obstruction, reducible, right inguinal hernia. Treatment given for UTI        Zachary Melter, MD 11/24/11 [phone]

## 2011-11-24 NOTE — ED Notes (Signed)
Pt with yellowish colored emesis

## 2011-11-24 NOTE — ED Notes (Signed)
Dr. Effie Shy notified of patient's elevated BP at this time. Orders received; will continue to monitor. Patient remains on cardiac monitor. Patient provided with urinal at this time and instructed on need for sample.

## 2011-11-24 NOTE — ED Notes (Signed)
Patient brought in by EMS from home with c/o generalized abdominal pain and nausea x 3-4 days. Reports decreased appetite during this time and reports last BM was approx. 4 days ago.

## 2011-11-24 NOTE — ED Notes (Signed)
Surgery md at bedside. Patty rn at bedside to reattempt NGT per surgery md

## 2011-11-24 NOTE — Progress Notes (Signed)
Dr Biagio Quint notified by with wheezes and chest tightness.  Orders received for albuterol nebs.  Also discussed pt inability to have ng placed orders received to have ng placed in interventional radiology when abscess drain placed.  Pt also to hold Lovenox per Dr Biagio Quint due to abscess drain to be placed in am.

## 2011-11-25 ENCOUNTER — Other Ambulatory Visit: Payer: Self-pay

## 2011-11-25 LAB — CBC
HCT: 37.3 % — ABNORMAL LOW (ref 39.0–52.0)
Hemoglobin: 13 g/dL (ref 13.0–17.0)
MCHC: 34.9 g/dL (ref 30.0–36.0)
RBC: 4.16 MIL/uL — ABNORMAL LOW (ref 4.22–5.81)
WBC: 8.1 10*3/uL (ref 4.0–10.5)

## 2011-11-25 LAB — BASIC METABOLIC PANEL
BUN: 13 mg/dL (ref 6–23)
Chloride: 95 mEq/L — ABNORMAL LOW (ref 96–112)
GFR calc Af Amer: >90 mL/min (ref >90)
GFR calc non Af Amer: >90 mL/min (ref >90)
Potassium: 4.1 mEq/L (ref 3.5–5.1)
Sodium: 130 mEq/L — ABNORMAL LOW (ref 135–145)

## 2011-11-25 MED ORDER — ALBUTEROL SULFATE (5 MG/ML) 0.5% IN NEBU
2.5000 mg | INHALATION_SOLUTION | RESPIRATORY_TRACT | Status: DC | PRN
  Administered 2011-11-26 – 2011-11-29 (×8): 2.5 mg via RESPIRATORY_TRACT
  Filled 2011-11-25 (×9): qty 0.5

## 2011-11-25 MED ORDER — ALBUTEROL SULFATE (5 MG/ML) 0.5% IN NEBU
2.5000 mg | INHALATION_SOLUTION | RESPIRATORY_TRACT | Status: DC
  Administered 2011-11-25 (×2): 2.5 mg via RESPIRATORY_TRACT
  Filled 2011-11-25 (×2): qty 0.5

## 2011-11-25 NOTE — Progress Notes (Signed)
Attempts since late morning to find out status of when IR consult/evaluation for drain and ngt placement to be done. Recently spoke with Raiford Noble, radiological tech,who reported there were no IR P.A.'s available today or on-call to evaluate and prepare pt for procedure. Dr. Abbey Chatters aware via phone. See new orders entered by physician.

## 2011-11-25 NOTE — Progress Notes (Signed)
Confirmed with Radiology that pt is on list for Interventional Radiologist in am.

## 2011-11-25 NOTE — Progress Notes (Signed)
Subjective: Feels better but still has lower abdominal pains.  No vomiting.  +BM  Objective: Vital signs in last 24 hours: Temp:  [98.1 F (36.7 C)-100.8 F (38.2 C)] 98.9 F (37.2 C) (02/03 0459) Pulse Rate:  [79-99] 79  (02/03 0459) Resp:  [16-18] 18  (02/03 0459) BP: (108-147)/(69-91) 110/71 mmHg (02/03 0459) SpO2:  [93 %-97 %] 95 % (02/03 0459) Weight:  [115 lb (52.164 kg)] 115 lb (52.164 kg) (02/02 1530) Last BM Date: 11/25/11 (small hard bm)  Intake/Output from previous day: 02/02 0701 - 02/03 0700 In: 2106.5 [P.O.:19; I.V.:1687.5; IV Piggyback:400] Out: 700 [Urine:700] Intake/Output this shift:    General appearance: alert, cooperative and no distress Resp: nonlabored breathing Cardio: normal rate, regular rhythm GI: still distended and with some tympany, lower abdominal tenderness LLQ greatest.  Exam improved from yesterday.  Lab Results:   Basename 11/25/11 0412 11/24/11 0500  WBC 8.1 4.4  HGB 13.0 17.9*  HCT 37.3* 50.8  PLT 175 230   BMET  Basename 11/25/11 0412 11/24/11 0500  NA 130* 128*  K 4.1 3.9  CL 95* 89*  CO2 28 26  GLUCOSE 87 130*  BUN 13 9  CREATININE 0.90 0.95  CALCIUM 8.4 10.3   PT/INR No results found for this basename: LABPROT:2,INR:2 in the last 72 hours ABG No results found for this basename: PHART:2,PCO2:2,PO2:2,HCO3:2 in the last 72 hours  Studies/Results: Ct Abdomen Pelvis W Contrast  11/24/2011  *RADIOLOGY REPORT*  Clinical Data: Abdominal pain, diarrhea, anorexia.Small bowel obstruction on abdominal radiographs.  CT ABDOMEN AND PELVIS WITH CONTRAST  Technique:  Multidetector CT imaging of the abdomen and pelvis was performed following the standard protocol during bolus administration of intravenous contrast.  Contrast: 100mL OMNIPAQUE IOHEXOL 300 MG/ML IV SOLN  Comparison: None.  Findings: Diverticulosis and moderate wall thickening is seen involving the sigmoid colon, consistent with diverticulitis.  Dilated small bowel loops  are seen with a transition point in the left lower quadrant adjacent to the inflamed sigmoid colon.  This is consistent with a distal small bowel obstruction secondary to inflammatory changes from diverticulitis.  A small rim enhancing fluid collection containing an air bubble is seen in the left anterior pelvis between the anterior abdominal wall muscles and bladder which measures 3.3 x 7.4 cm.  This is consistent with abscess.  Small amount of free fluid is noted in the pelvic cul-de-sac.  Small bilateral inguinal hernias are seen containing fat on the right and a nondilated small bowel loop on the left.  The abdominal parenchymal organs are normal in appearance.  No evidence of hydronephrosis.  Small hiatal hernia.  No soft tissue masses or lymphadenopathy identified.  A small amount of ascites is also seen in both the right and left upper quadrant.  IMPRESSION:  1.  Moderate sigmoid diverticulitis. 2. Diverticular abscess in the anterior left pelvis measuring 3.3 x 7.4 cm. 3.  Distal small bowel obstruction with transition point in the left lower quadrant, secondary to diverticulitis and abscess 4.  Mild ascites. 5.  Small bilateral inguinal hernias containing fat on the right and a small bowel loop on the left. 5.  Small hiatal hernia.  Original Report Authenticated By: JOHN A. STAHL, M.D.   Dg Abd Acute W/chest  11/24/2011  *RADIOLOGY REPORT*  Clinical Data: Lower abdominal pain, history of hernia repairs.  ACUTE ABDOMEN SERIES (ABDOMEN 2 VIEW & CHEST 1 VIEW)  Comparison: None.  Findings: Lungs are clear.  There are dilated loops of small bowel, measuring up   to 3.7 cm.  Relative paucity of distal bowel gas.  No free intraperitoneal air identified.  Organ outlines normal where seen. No acute osseous abnormality.  IMPRESSION: Dilated small bowel loops and relative paucity of bowel gas distally.  Small bowel obstruction not excluded.  Consider CT to further evaluate.  Original Report Authenticated By: ANDREW J.  DELGAIZO, M.D.    Anti-infectives: Anti-infectives     Start     Dose/Rate Route Frequency Ordered Stop   11/24/11 1800   metroNIDAZOLE (FLAGYL) IVPB 500 mg        500 mg 100 mL/hr over 60 Minutes Intravenous Every 8 hours 11/24/11 1506     11/24/11 1509   ciprofloxacin (CIPRO) IVPB 400 mg        400 mg 200 mL/hr over 60 Minutes Intravenous Every 12 hours 11/24/11 1506     11/24/11 1000   ciprofloxacin (CIPRO) IVPB 400 mg        400 mg 200 mL/hr over 60 Minutes Intravenous  Once 11/24/11 0950 11/24/11 1106   11/24/11 1000   metroNIDAZOLE (FLAGYL) IVPB 500 mg        500 mg 100 mL/hr over 60 Minutes Intravenous  Once 11/24/11 0950 11/24/11 1212   11/24/11 0815   cefTRIAXone (ROCEPHIN) 1 g in dextrose 5 % 50 mL IVPB        1 g 100 mL/hr over 30 Minutes Intravenous  Once 11/24/11 0800 11/24/11 0852          Assessment/Plan: s/p  He feels better.  Unable to place NG which I think would help his distension and improve his discomfort.  Will plan for perc drain of abscess and possible NG tube placement if sedated for drain.  Continue antibiotics.  LOS: 1 day    Lajoy Vanamburg DAVID 11/25/2011  

## 2011-11-26 ENCOUNTER — Inpatient Hospital Stay (HOSPITAL_COMMUNITY): Payer: Medicaid Other

## 2011-11-26 MED ORDER — IOHEXOL 300 MG/ML  SOLN: 15.0000 mL | Freq: Once | INTRAMUSCULAR | Status: AC | PRN

## 2011-11-26 MED ORDER — MIDAZOLAM HCL 5 MG/5ML IJ SOLN
INTRAMUSCULAR | Status: AC | PRN
  Administered 2011-11-26 (×4): 1 mg via INTRAVENOUS

## 2011-11-26 MED ORDER — PHENOL 1.4 % MT LIQD
1.0000 | OROMUCOSAL | Status: DC | PRN
  Administered 2011-11-26: 1 via OROMUCOSAL
  Filled 2011-11-26: qty 177

## 2011-11-26 MED ORDER — LORAZEPAM 2 MG/ML IJ SOLN
0.2500 mg | Freq: Four times a day (QID) | INTRAMUSCULAR | Status: DC | PRN
  Administered 2011-11-26 – 2011-11-28 (×4): 0.26 mg via INTRAVENOUS
  Filled 2011-11-26 (×4): qty 1

## 2011-11-26 MED ORDER — FENTANYL CITRATE 0.05 MG/ML IJ SOLN
INTRAMUSCULAR | Status: AC | PRN
  Administered 2011-11-26 (×4): 50 ug via INTRAVENOUS

## 2011-11-26 NOTE — Interval H&P Note (Signed)
History and Physical Interval Note:  11/26/2011 1:21 PM  Zachary Walls  has presented today for surgery, with the diagnosis of * No surgery found *  The various methods of treatment have been discussed with the patient and family. After consideration of risks, benefits and other options for treatment, the patient has consented to   as a surgical intervention .  The patients' history has been reviewed, patient examined, no change in status, stable for surgery.  I have reviewed the patients' chart and labs.  Questions were answered to the patient's satisfaction.     Jeananne Rama PA

## 2011-11-26 NOTE — Procedures (Signed)
Procedure:  CT guided abscess drainage Findings:  Anterior pelvic fluid grossly purulent/feculent.  Sample sent for culture studies.  12 Fr perc drain placed.  Drain flushed and placed to suction bulb. Plan:  Will follow output.

## 2011-11-26 NOTE — Progress Notes (Signed)
Subjective: C/o pain earlier. Afebrile. Feels better with drain in.  NG placed and it's in about as far as it can go.  Will get film to see where it is.  Objective: Vital signs in last 24 hours: Temp:  [98 F (36.7 C)-100.6 F (38.1 C)] 98.5 F (36.9 C) (02/04 0439) Pulse Rate:  [78-94] 91  (02/04 1258) Resp:  [9-97] 14  (02/04 1258) BP: (105-156)/(58-103) 134/78 mmHg (02/04 1258) SpO2:  [91 %-100 %] 100 % (02/04 1258) Last BM Date: 11/24/11  Intake/Output from previous day: 02/03 0701 - 02/04 0700 In: 3758.3 [I.V.:3058.3; IV Piggyback:700] Out: 1335 [Urine:1335] Intake/Output this shift: Total I/O In: 0  Out: 225 [Urine:225]  PE:  Alert, Holding NG just back from Xray.  NG is in about as far as it will go.  Chest:  Clear, quit smoking 3 weeks ago. Abd:  Distended, tight, Drain RLQ, Green purulent drainage.  Bulb is full.  Lab Results:   Basename 11/25/11 0412 11/24/11 0500  WBC 8.1 4.4  HGB 13.0 17.9*  HCT 37.3* 50.8  PLT 175 230   {  Lab 11/24/11 0500  AST 18  ALT 10  ALKPHOS 82  BILITOT 0.9  PROT 8.4*  ALBUMIN 3.6    BMET  Basename 11/25/11 0412 11/24/11 0500  NA 130* 128*  K 4.1 3.9  CL 95* 89*  CO2 28 26  GLUCOSE 87 130*  BUN 13 9  CREATININE 0.90 0.95  CALCIUM 8.4 10.3   PT/INR No results found for this basename: LABPROT:2,INR:2 in the last 72 hours   Studies/Results: Ct Guided Abscess Drain  11/26/2011  *RADIOLOGY REPORT*  Clinical Data: Bowel obstruction and peritoneal abscess located in the pelvis.  CT GUIDED DRAINAGE OF PERITONEAL ABSCESS  Sedation:  3.0 mg IV Versed;  150 mcg IV Fentanyl  Total Moderate Sedation Time: 35 minutes.  Procedure:  The procedure, risks, benefits, and alternatives were explained to the patient.  Questions regarding the procedure were encouraged and answered. The patient understands and consents to the procedure.  The abdominal wall was prepped with betadine in a sterile fashion, and a sterile drape was applied  covering the operative field.  A sterile gown and sterile gloves were used for the procedure. Local anesthesia was provided with 1% Lidocaine.  CT was performed in a supine position.  Under CT guidance, an 18 gauge trocar needle was advanced into the anterior pelvis. Aspiration of fluid was performed.  A fluid sample was sent for culture studies.  A guidewire was advanced through the needle.  The tract was dilated and a 12-French percutaneous drain advanced into the collection. The drain was retracted slightly and flushed with saline.  It was then connected to a suction bulb.  It was secured at the skin with a Prolene retention suture and Stat-Lock device.  Complications: None  Findings: Collection containing an air-fluid level was again localized in the anterior lower peritoneal cavity just above the bladder.  Aspiration yielded grossly purulent and feculent appearing fluid.  The drain was placed in the collection and is draining well to suction.  IMPRESSION: CT guided drainage of peritoneal abscess located in the pelvis with placement of 12-French drain.  Original Report Authenticated By: Reola Calkins, M.D.    Anti-infectives: Anti-infectives     Start     Dose/Rate Route Frequency Ordered Stop   11/24/11 1800   metroNIDAZOLE (FLAGYL) IVPB 500 mg        500 mg 100 mL/hr over 60 Minutes  Intravenous Every 8 hours 11/24/11 1506     11/24/11 1509   ciprofloxacin (CIPRO) IVPB 400 mg        400 mg 200 mL/hr over 60 Minutes Intravenous Every 12 hours 11/24/11 1506     11/24/11 1000   ciprofloxacin (CIPRO) IVPB 400 mg        400 mg 200 mL/hr over 60 Minutes Intravenous  Once 11/24/11 0950 11/24/11 1106   11/24/11 1000   metroNIDAZOLE (FLAGYL) IVPB 500 mg        500 mg 100 mL/hr over 60 Minutes Intravenous  Once 11/24/11 0950 11/24/11 1212   11/24/11 0815   cefTRIAXone (ROCEPHIN) 1 g in dextrose 5 % 50 mL IVPB        1 g 100 mL/hr over 30 Minutes Intravenous  Once 11/24/11 0800 11/24/11 1610           Current Facility-Administered Medications  Medication Dose Route Frequency Provider Last Rate Last Dose  . 0.9 % NaCl with KCl 20 mEq/ L  infusion   Intravenous Continuous Rulon Abide, DO 125 mL/hr at 11/26/11 0910    . albuterol (PROVENTIL) (5 MG/ML) 0.5% nebulizer solution 2.5 mg  2.5 mg Nebulization Q2H PRN Rulon Abide, DO      . albuterol (PROVENTIL) (5 MG/ML) 0.5% nebulizer solution        2.5 mg at 11/25/11 2037  . ciprofloxacin (CIPRO) IVPB 400 mg  400 mg Intravenous Q12H Rulon Abide, DO   400 mg at 11/26/11 0119  . enoxaparin (LOVENOX) injection 40 mg  40 mg Subcutaneous Q24H Rulon Abide, DO   40 mg at 11/25/11 2126  . fentaNYL (SUBLIMAZE) injection   Intravenous PRN Reola Calkins, MD   50 mcg at 11/26/11 1255  . metroNIDAZOLE (FLAGYL) IVPB 500 mg  500 mg Intravenous Q8H Rulon Abide, DO   500 mg at 11/26/11 1001  . midazolam (VERSED) 5 MG/5ML injection   Intravenous PRN Reola Calkins, MD   1 mg at 11/26/11 1255  . morphine 2 MG/ML injection 2-4 mg  2-4 mg Intravenous Q1H PRN Rulon Abide, DO   4 mg at 11/26/11 9604  . ondansetron (ZOFRAN) injection 4 mg  4 mg Intravenous Q6H PRN Rulon Abide, DO      . DISCONTD: albuterol (PROVENTIL) (5 MG/ML) 0.5% nebulizer solution 2.5 mg  2.5 mg Nebulization Q4H Rulon Abide, DO   2.5 mg at 11/25/11 1232    Assessment/Plan Diverticulitis with abscess and likely associated small bowel obstruction .  COPD (chronic obstructive pulmonary disease)    .  Pneumonia    .  Hypertension    Hernia repair  HOH  Plan: CT guided Abscess drain placed by IR today.  Check film to see where tube is.  Place on low Gomco suction. Continue antibiotic, recheck labs AM.      LOS: 2 days    Dallan Schonberg 11/26/2011

## 2011-11-26 NOTE — Interval H&P Note (Cosign Needed)
History and Physical Interval Note:  11/26/2011 9:03 AM  Zachary Walls  Is scheduled for CT guided aspiration/possible drainage of pelvic abscess today.  The various methods of treatment have been discussed with the patient and family. After consideration of risks, benefits and other options for treatment, the patient has consented to the above procedure.  The patients' history has been reviewed, patient examined, no change in status, stable for the above procedure.  I have reviewed the patients' chart and labs.  Questions were answered to the patient's satisfaction.  Consent signed. Past Medical History  Diagnosis Date  . COPD (chronic obstructive pulmonary disease)   . Pneumonia   . Hypertension    Past Surgical History  Procedure Date  . Hernia repair     inguinal   Results for orders placed during the hospital encounter of 11/24/11  COMPREHENSIVE METABOLIC PANEL      Component Value Range   Sodium 128 (*) 135 - 145 (mEq/L)   Potassium 3.9  3.5 - 5.1 (mEq/L)   Chloride 89 (*) 96 - 112 (mEq/L)   CO2 26  19 - 32 (mEq/L)   Glucose, Bld 130 (*) 70 - 99 (mg/dL)   BUN 9  6 - 23 (mg/dL)   Creatinine, Ser 1.61  0.50 - 1.35 (mg/dL)   Calcium 09.6  8.4 - 10.5 (mg/dL)   Total Protein 8.4 (*) 6.0 - 8.3 (g/dL)   Albumin 3.6  3.5 - 5.2 (g/dL)   AST 18  0 - 37 (U/L)   ALT 10  0 - 53 (U/L)   Alkaline Phosphatase 82  39 - 117 (U/L)   Total Bilirubin 0.9  0.3 - 1.2 (mg/dL)   GFR calc non Af Amer >90  >90 (mL/min)   GFR calc Af Amer >90  >90 (mL/min)  LIPASE, BLOOD      Component Value Range   Lipase 15  11 - 59 (U/L)  CBC      Component Value Range   WBC 4.4  4.0 - 10.5 (K/uL)   RBC 5.70  4.22 - 5.81 (MIL/uL)   Hemoglobin 17.9 (*) 13.0 - 17.0 (g/dL)   HCT 04.5  40.9 - 81.1 (%)   MCV 89.1  78.0 - 100.0 (fL)   MCH 31.4  26.0 - 34.0 (pg)   MCHC 35.2  30.0 - 36.0 (g/dL)   RDW 91.4  78.2 - 95.6 (%)   Platelets 230  150 - 400 (K/uL)  DIFFERENTIAL      Component Value Range   Neutrophils  Relative 74  43 - 77 (%)   Neutro Abs 3.3  1.7 - 7.7 (K/uL)   Lymphocytes Relative 21  12 - 46 (%)   Lymphs Abs 0.9  0.7 - 4.0 (K/uL)   Monocytes Relative 5  3 - 12 (%)   Monocytes Absolute 0.2  0.1 - 1.0 (K/uL)   Eosinophils Relative 0  0 - 5 (%)   Eosinophils Absolute 0.0  0.0 - 0.7 (K/uL)   Basophils Relative 0  0 - 1 (%)   Basophils Absolute 0.0  0.0 - 0.1 (K/uL)  URINALYSIS, ROUTINE W REFLEX MICROSCOPIC      Component Value Range   Color, Urine ORANGE (*) YELLOW    APPearance CLOUDY (*) CLEAR    Specific Gravity, Urine 1.023  1.005 - 1.030    pH 5.5  5.0 - 8.0    Glucose, UA NEGATIVE  NEGATIVE (mg/dL)   Hgb urine dipstick NEGATIVE  NEGATIVE  Bilirubin Urine SMALL (*) NEGATIVE    Ketones, ur TRACE (*) NEGATIVE (mg/dL)   Protein, ur 409 (*) NEGATIVE (mg/dL)   Urobilinogen, UA 1.0  0.0 - 1.0 (mg/dL)   Nitrite POSITIVE (*) NEGATIVE    Leukocytes, UA SMALL (*) NEGATIVE   OCCULT BLOOD, POC DEVICE      Component Value Range   Fecal Occult Bld NEGATIVE    URINE MICROSCOPIC-ADD ON      Component Value Range   WBC, UA 0-2  <3 (WBC/hpf)   Bacteria, UA FEW (*) RARE    Casts HYALINE CASTS (*) NEGATIVE    Urine-Other MUCOUS PRESENT    BASIC METABOLIC PANEL      Component Value Range   Sodium 130 (*) 135 - 145 (mEq/L)   Potassium 4.1  3.5 - 5.1 (mEq/L)   Chloride 95 (*) 96 - 112 (mEq/L)   CO2 28  19 - 32 (mEq/L)   Glucose, Bld 87  70 - 99 (mg/dL)   BUN 13  6 - 23 (mg/dL)   Creatinine, Ser 8.11  0.50 - 1.35 (mg/dL)   Calcium 8.4  8.4 - 91.4 (mg/dL)   GFR calc non Af Amer >90  >90 (mL/min)   GFR calc Af Amer >90  >90 (mL/min)  CBC      Component Value Range   WBC 8.1  4.0 - 10.5 (K/uL)   RBC 4.16 (*) 4.22 - 5.81 (MIL/uL)   Hemoglobin 13.0  13.0 - 17.0 (g/dL)   HCT 78.2 (*) 95.6 - 52.0 (%)   MCV 89.7  78.0 - 100.0 (fL)   MCH 31.3  26.0 - 34.0 (pg)   MCHC 34.9  30.0 - 36.0 (g/dL)   RDW 21.3  08.6 - 57.8 (%)   Platelets 175  150 - 400 (K/uL)     Cardale Dorer,D KEVIN

## 2011-11-26 NOTE — ED Notes (Signed)
Pt transported to X Ray room 1 via stretcher with portable monitor and RN for NGT placement.

## 2011-11-26 NOTE — H&P (View-Only) (Signed)
Subjective: Feels better but still has lower abdominal pains.  No vomiting.  +BM  Objective: Vital signs in last 24 hours: Temp:  [98.1 F (36.7 C)-100.8 F (38.2 C)] 98.9 F (37.2 C) (02/03 0459) Pulse Rate:  [79-99] 79  (02/03 0459) Resp:  [16-18] 18  (02/03 0459) BP: (108-147)/(69-91) 110/71 mmHg (02/03 0459) SpO2:  [93 %-97 %] 95 % (02/03 0459) Weight:  [115 lb (52.164 kg)] 115 lb (52.164 kg) (02/02 1530) Last BM Date: 11/25/11 (small hard bm)  Intake/Output from previous day: 02/02 0701 - 02/03 0700 In: 2106.5 [P.O.:19; I.V.:1687.5; IV Piggyback:400] Out: 700 [Urine:700] Intake/Output this shift:    General appearance: alert, cooperative and no distress Resp: nonlabored breathing Cardio: normal rate, regular rhythm GI: still distended and with some tympany, lower abdominal tenderness LLQ greatest.  Exam improved from yesterday.  Lab Results:   Basename 11/25/11 0412 11/24/11 0500  WBC 8.1 4.4  HGB 13.0 17.9*  HCT 37.3* 50.8  PLT 175 230   BMET  Basename 11/25/11 0412 11/24/11 0500  NA 130* 128*  K 4.1 3.9  CL 95* 89*  CO2 28 26  GLUCOSE 87 130*  BUN 13 9  CREATININE 0.90 0.95  CALCIUM 8.4 10.3   PT/INR No results found for this basename: LABPROT:2,INR:2 in the last 72 hours ABG No results found for this basename: PHART:2,PCO2:2,PO2:2,HCO3:2 in the last 72 hours  Studies/Results: Ct Abdomen Pelvis W Contrast  11/24/2011  *RADIOLOGY REPORT*  Clinical Data: Abdominal pain, diarrhea, anorexia.Small bowel obstruction on abdominal radiographs.  CT ABDOMEN AND PELVIS WITH CONTRAST  Technique:  Multidetector CT imaging of the abdomen and pelvis was performed following the standard protocol during bolus administration of intravenous contrast.  Contrast: OMNIPAQUE IOHEXOL 300 MG/ML IV SOLN  Comparison: None.  Findings: Diverticulosis and moderate wall thickening is seen involving the sigmoid colon, consistent with diverticulitis.  Dilated small bowel loops  are seen with a transition point in the left lower quadrant adjacent to the inflamed sigmoid colon.  This is consistent with a distal small bowel obstruction secondary to inflammatory changes from diverticulitis.  A small rim enhancing fluid collection containing an air bubble is seen in the left anterior pelvis between the anterior abdominal wall muscles and bladder which measures 3.3 x 7.4 cm.  This is consistent with abscess.  Small amount of free fluid is noted in the pelvic cul-de-sac.  Small bilateral inguinal hernias are seen containing fat on the right and a nondilated small bowel loop on the left.  The abdominal parenchymal organs are normal in appearance.  No evidence of hydronephrosis.  Small hiatal hernia.  No soft tissue masses or lymphadenopathy identified.  A small amount of ascites is also seen in both the right and left upper quadrant.  IMPRESSION:  1.  Moderate sigmoid diverticulitis. 2. Diverticular abscess in the anterior left pelvis measuring 3.3 x 7.4 cm. 3.  Distal small bowel obstruction with transition point in the left lower quadrant, secondary to diverticulitis and abscess 4.  Mild ascites. 5.  Small bilateral inguinal hernias containing fat on the right and a small bowel loop on the left. 5.  Small hiatal hernia.  Original Report Authenticated By: Danae Orleans, M.D.   Dg Abd Acute W/chest  11/24/2011  *RADIOLOGY REPORT*  Clinical Data: Lower abdominal pain, history of hernia repairs.  ACUTE ABDOMEN SERIES (ABDOMEN 2 VIEW & CHEST 1 VIEW)  Comparison: None.  Findings: Lungs are clear.  There are dilated loops of small bowel, measuring up  to 3.7 cm.  Relative paucity of distal bowel gas.  No free intraperitoneal air identified.  Organ outlines normal where seen. No acute osseous abnormality.  IMPRESSION: Dilated small bowel loops and relative paucity of bowel gas distally.  Small bowel obstruction not excluded.  Consider CT to further evaluate.  Original Report Authenticated By: Waneta Martins, M.D.    Anti-infectives: Anti-infectives     Start     Dose/Rate Route Frequency Ordered Stop   11/24/11 1800   metroNIDAZOLE (FLAGYL) IVPB 500 mg        500 mg 100 mL/hr over 60 Minutes Intravenous Every 8 hours 11/24/11 1506     11/24/11 1509   ciprofloxacin (CIPRO) IVPB 400 mg        400 mg 200 mL/hr over 60 Minutes Intravenous Every 12 hours 11/24/11 1506     11/24/11 1000   ciprofloxacin (CIPRO) IVPB 400 mg        400 mg 200 mL/hr over 60 Minutes Intravenous  Once 11/24/11 0950 11/24/11 1106   11/24/11 1000   metroNIDAZOLE (FLAGYL) IVPB 500 mg        500 mg 100 mL/hr over 60 Minutes Intravenous  Once 11/24/11 0950 11/24/11 1212   11/24/11 0815   cefTRIAXone (ROCEPHIN) 1 g in dextrose 5 % 50 mL IVPB        1 g 100 mL/hr over 30 Minutes Intravenous  Once 11/24/11 0800 11/24/11 1610          Assessment/Plan: s/p  He feels better.  Unable to place NG which I think would help his distension and improve his discomfort.  Will plan for perc drain of abscess and possible NG tube placement if sedated for drain.  Continue antibiotics.  LOS: 1 day    Lodema Pilot DAVID 11/25/2011

## 2011-11-26 NOTE — Progress Notes (Signed)
Drain in place Still with reactive ileus.  Will monitor status over the next couple of days.   May still require surgery.  Wilmon Arms. Corliss Skains, MD, [facility]  11/26/2011 4:31 PM

## 2011-11-26 NOTE — Progress Notes (Signed)
Pt ambulated in hallway with nurse.  Pt abdomen distended and tight.  Pt denies nausea bowel sounds tympanic,  Encouraged pt to let nurses attempt NG tube to help with distention.  Pt adamantly refuses to have NG placed.  Discussed possible complications of not have tube placed, pt states that he will have to be knocked out to have the NG placed and that would be the only way.

## 2011-11-27 LAB — MAGNESIUM: Magnesium: 1.8 mg/dL (ref 1.5–2.5)

## 2011-11-27 LAB — COMPREHENSIVE METABOLIC PANEL
Albumin: 2.3 g/dL — ABNORMAL LOW (ref 3.5–5.2)
BUN: 10 mg/dL (ref 6–23)
Calcium: 8.4 mg/dL (ref 8.4–10.5)
Creatinine, Ser: 0.65 mg/dL (ref 0.50–1.35)
Total Protein: 5.8 g/dL — ABNORMAL LOW (ref 6.0–8.3)

## 2011-11-27 LAB — CBC
HCT: 34.8 % — ABNORMAL LOW (ref 39.0–52.0)
MCHC: 33.9 g/dL (ref 30.0–36.0)
MCV: 90.6 fL (ref 78.0–100.0)
RDW: 14.3 % (ref 11.5–15.5)

## 2011-11-27 NOTE — Progress Notes (Signed)
Awaiting resolution of reactive ileus.  Perc drain/ abx.  Wilmon Arms. Corliss Skains, MD, [facility]  11/27/2011 5:25 PM

## 2011-11-27 NOTE — Progress Notes (Signed)
Subjective: Ongoing abd pain.  He can't get comfortable with NG.  Throat is sore.  Hearing aide not working right.   Objective: Vital signs in last 24 hours: Temp:  [98.1 F (36.7 C)-98.3 F (36.8 C)] 98.2 F (36.8 C) (02/05 0543) Pulse Rate:  [78-102] 90  (02/05 0543) Resp:  [9-97] 18  (02/05 0543) BP: (112-165)/(58-103) 144/80 mmHg (02/05 0543) SpO2:  [92 %-100 %] 95 % (02/05 0544) Last BM Date: 11/26/11  Intake/Output from previous day: 02/04 0701 - 02/05 0700 In: 1420 [I.V.:1260; NG/GT:60] Out: 1985 [Urine:1175; Emesis/NG output:700; Drains:30] Intake/Output this shift: Total I/O In: -  Out: 100 [Urine:100]  PE:  Alert, Holding NG787ml recorded.    Chest:  Clear, quit smoking 3 weeks ago. Abd:  Distended, tight, Drain RLQ,Clear this AM  35 ML recorded. Lab Results:   Basename 11/27/11 0415 11/25/11 0412  WBC 8.1 8.1  HGB 11.8* 13.0  HCT 34.8* 37.3*  PLT 227 175   {  Lab 11/27/11 0415 11/24/11 0500  AST 21 18  ALT 8 10  ALKPHOS 61 82  BILITOT 0.2* 0.9  PROT 5.8* 8.4*  ALBUMIN 2.3* 3.6    BMET  Basename 11/27/11 0415 11/25/11 0412  NA 134* 130*  K 3.6 4.1  CL 101 95*  CO2 25 28  GLUCOSE 83 87  BUN 10 13  CREATININE 0.65 0.90  CALCIUM 8.4 8.4   PT/INR No results found for this basename: LABPROT:2,INR:2 in the last 72 hours   Studies/Results: Ct Guided Abscess Drain  11/26/2011  *RADIOLOGY REPORT*  Clinical Data: Bowel obstruction and peritoneal abscess located in the pelvis.  CT GUIDED DRAINAGE OF PERITONEAL ABSCESS  Sedation:  3.0 mg IV Versed;  150 mcg IV Fentanyl  Total Moderate Sedation Time: 35 minutes.  Procedure:  The procedure, risks, benefits, and alternatives were explained to the patient.  Questions regarding the procedure were encouraged and answered. The patient understands and consents to the procedure.  The abdominal wall was prepped with betadine in a sterile fashion, and a sterile drape was applied covering the operative field.  A  sterile gown and sterile gloves were used for the procedure. Local anesthesia was provided with 1% Lidocaine.  CT was performed in a supine position.  Under CT guidance, an 18 gauge trocar needle was advanced into the anterior pelvis. Aspiration of fluid was performed.  A fluid sample was sent for culture studies.  A guidewire was advanced through the needle.  The tract was dilated and a 12-French percutaneous drain advanced into the collection. The drain was retracted slightly and flushed with saline.  It was then connected to a suction bulb.  It was secured at the skin with a Prolene retention suture and Stat-Lock device.  Complications: None  Findings: Collection containing an air-fluid level was again localized in the anterior lower peritoneal cavity just above the bladder.  Aspiration yielded grossly purulent and feculent appearing fluid.  The drain was placed in the collection and is draining well to suction.  IMPRESSION: CT guided drainage of peritoneal abscess located in the pelvis with placement of 12-French drain.  Original Report Authenticated By: Reola Calkins, M.D.   Dg Naso G Tube Plc W/fl W/rad  11/26/2011  *RADIOLOGY REPORT*  Clinical Data: Small bowel obstruction.  NG tube placement needed.  NASO G TUEB PLACEMENT WITH FL AND WITH RAD  Fluoroscopy time:  1.12 minutes  Comparison: None.  Findings: Both nares were initially swabbed with lidocaine gel and the throat was  sprayed with cetacaine.  Subsequently, a nasogastric tube was passed under intermittent fluoroscopic guidance into the stomach.  Proper placement of the tip the tube was confirmed with injection of approximately 15 ml of Omnipaque-300.  The tube was then flushed with water and secured in place.  IMPRESSION:  1.  Successful fluoroscopic guided placement of nasogastric tube into the stomach, as above.  Original Report Authenticated By: Florencia Reasons, M.D.    Anti-infectives: Anti-infectives     Start     Dose/Rate Route  Frequency Ordered Stop   11/24/11 1800   metroNIDAZOLE (FLAGYL) IVPB 500 mg        500 mg 100 mL/hr over 60 Minutes Intravenous Every 8 hours 11/24/11 1506     11/24/11 1509   ciprofloxacin (CIPRO) IVPB 400 mg        400 mg 200 mL/hr over 60 Minutes Intravenous Every 12 hours 11/24/11 1506     11/24/11 1000   ciprofloxacin (CIPRO) IVPB 400 mg        400 mg 200 mL/hr over 60 Minutes Intravenous  Once 11/24/11 0950 11/24/11 1106   11/24/11 1000   metroNIDAZOLE (FLAGYL) IVPB 500 mg        500 mg 100 mL/hr over 60 Minutes Intravenous  Once 11/24/11 0950 11/24/11 1212   11/24/11 0815   cefTRIAXone (ROCEPHIN) 1 g in dextrose 5 % 50 mL IVPB        1 g 100 mL/hr over 30 Minutes Intravenous  Once 11/24/11 0800 11/24/11 0981         Current Facility-Administered Medications  Medication Dose Route Frequency Provider Last Rate Last Dose  . 0.9 % NaCl with KCl 20 mEq/ L  infusion   Intravenous Continuous Rulon Abide, DO 125 mL/hr at 11/27/11 1914    . albuterol (PROVENTIL) (5 MG/ML) 0.5% nebulizer solution 2.5 mg  2.5 mg Nebulization Q2H PRN Rulon Abide, DO   2.5 mg at 11/27/11 0544  . ciprofloxacin (CIPRO) IVPB 400 mg  400 mg Intravenous Q12H Rulon Abide, DO   400 mg at 11/27/11 0158  . enoxaparin (LOVENOX) injection 40 mg  40 mg Subcutaneous Q24H Rulon Abide, DO   40 mg at 11/26/11 2256  . fentaNYL (SUBLIMAZE) injection   Intravenous PRN Reola Calkins, MD   50 mcg at 11/26/11 1255  . iohexol (OMNIPAQUE) 300 MG/ML solution 15 mL  15 mL Other Once PRN Medication Radiologist, MD      . LORazepam (ATIVAN) injection 0.26 mg  0.26 mg Intravenous Q6H PRN Mariella Saa, MD   0.26 mg at 11/26/11 2256  . metroNIDAZOLE (FLAGYL) IVPB 500 mg  500 mg Intravenous Q8H Rulon Abide, DO   500 mg at 11/27/11 0158  . midazolam (VERSED) 5 MG/5ML injection   Intravenous PRN Reola Calkins, MD   1 mg at 11/26/11 1255  . morphine 2 MG/ML injection 2-4 mg  2-4 mg  Intravenous Q1H PRN Rulon Abide, DO   4 mg at 11/27/11 0439  . ondansetron (ZOFRAN) injection 4 mg  4 mg Intravenous Q6H PRN Rulon Abide, DO      . phenol (CHLORASEPTIC) mouth spray 1 spray  1 spray Mouth/Throat PRN Wilmon Arms. Tsuei, MD   1 spray at 11/26/11 1735    Assessment/Plan Diverticulitis with abscess and likely associated small bowel obstruction .  COPD (chronic obstructive pulmonary disease)    .  Pneumonia    .  Hypertension  Hernia repair  HOH  Plan: CT guided Abscess drain placed by IR.  Drainage is clearer  Continue NG drainage and antibiotics       LOS: 3 days    Zachary Walls 11/27/2011

## 2011-11-27 NOTE — Progress Notes (Signed)
Subjective: Patient resting - comfortable - given events; did not awaken.  Objective: Vital signs in last 24 hours: Temp:  [98.2 F (36.8 C)-98.3 F (36.8 C)] 98.2 F (36.8 C) (02/05 0543) Pulse Rate:  [90] 90  (02/05 0543) Resp:  [16-18] 18  (02/05 0543) BP: (144-146)/(80-82) 144/80 mmHg (02/05 0543) SpO2:  [92 %-95 %] 95 % (02/05 0544) Last BM Date: 11/26/11  Intake/Output from previous day: 02/04 0701 - 02/05 0700 In: 1420 [I.V.:1260; NG/GT:60] Out: 1985 [Urine:1175; Emesis/NG output:700; Drains:30] Intake/Output this shift: Total I/O In: 955 [I.V.:955] Out: 105 [Urine:100; Drains:5]  Physical exam :  Patient resting quietly with NG tube in place and approximately 250 ml bilious fluid in canister.  RLQ drain by side - mostly serous fluid that I can see. Total of 35 ml output recorded since placement yesterday.  Cultures pending with GPC noted and few GNR.  Lab Results:   Basename 11/27/11 0415 11/25/11 0412  WBC 8.1 8.1  HGB 11.8* 13.0  HCT 34.8* 37.3*  PLT 227 175   BMET  Basename 11/27/11 0415 11/25/11 0412  NA 134* 130*  K 3.6 4.1  CL 101 95*  CO2 25 28  GLUCOSE 83 87  BUN 10 13  CREATININE 0.65 0.90  CALCIUM 8.4 8.4    Studies/Results: Ct Guided Abscess Drain  11/26/2011  *RADIOLOGY REPORT*  Clinical Data: Bowel obstruction and peritoneal abscess located in the pelvis.  CT GUIDED DRAINAGE OF PERITONEAL ABSCESS  Sedation:  3.0 mg IV Versed;  150 mcg IV Fentanyl  Total Moderate Sedation Time: 35 minutes.  Procedure:  The procedure, risks, benefits, and alternatives were explained to the patient.  Questions regarding the procedure were encouraged and answered. The patient understands and consents to the procedure.  The abdominal wall was prepped with betadine in a sterile fashion, and a sterile drape was applied covering the operative field.  A sterile gown and sterile gloves were used for the procedure. Local anesthesia was provided with 1% Lidocaine.  CT was  performed in a supine position.  Under CT guidance, an 18 gauge trocar needle was advanced into the anterior pelvis. Aspiration of fluid was performed.  A fluid sample was sent for culture studies.  A guidewire was advanced through the needle.  The tract was dilated and a 12-French percutaneous drain advanced into the collection. The drain was retracted slightly and flushed with saline.  It was then connected to a suction bulb.  It was secured at the skin with a Prolene retention suture and Stat-Lock device.  Complications: None  Findings: Collection containing an air-fluid level was again localized in the anterior lower peritoneal cavity just above the bladder.  Aspiration yielded grossly purulent and feculent appearing fluid.  The drain was placed in the collection and is draining well to suction.  IMPRESSION: CT guided drainage of peritoneal abscess located in the pelvis with placement of 12-French drain.  Original Report Authenticated By: Reola Calkins, M.D.   Dg Naso G Tube Plc W/fl W/rad  11/26/2011  *RADIOLOGY REPORT*  Clinical Data: Small bowel obstruction.  NG tube placement needed.  NASO G TUEB PLACEMENT WITH FL AND WITH RAD  Fluoroscopy time:  1.12 minutes  Comparison: None.  Findings: Both nares were initially swabbed with lidocaine gel and the throat was sprayed with cetacaine.  Subsequently, a nasogastric tube was passed under intermittent fluoroscopic guidance into the stomach.  Proper placement of the tip the tube was confirmed with injection of approximately 15 ml of Omnipaque-300.  The tube was then flushed with water and secured in place.  IMPRESSION:  1.  Successful fluoroscopic guided placement of nasogastric tube into the stomach, as above.  Original Report Authenticated By: Florencia Reasons, M.D.    Anti-infectives: Anti-infectives     Start     Dose/Rate Route Frequency Ordered Stop   11/24/11 1800   metroNIDAZOLE (FLAGYL) IVPB 500 mg        500 mg 100 mL/hr over 60 Minutes  Intravenous Every 8 hours 11/24/11 1506     11/24/11 1509   ciprofloxacin (CIPRO) IVPB 400 mg        400 mg 200 mL/hr over 60 Minutes Intravenous Every 12 hours 11/24/11 1506     11/24/11 1000   ciprofloxacin (CIPRO) IVPB 400 mg        400 mg 200 mL/hr over 60 Minutes Intravenous  Once 11/24/11 0950 11/24/11 1106   11/24/11 1000   metroNIDAZOLE (FLAGYL) IVPB 500 mg        500 mg 100 mL/hr over 60 Minutes Intravenous  Once 11/24/11 0950 11/24/11 1212   11/24/11 0815   cefTRIAXone (ROCEPHIN) 1 g in dextrose 5 % 50 mL IVPB        1 g 100 mL/hr over 30 Minutes Intravenous  Once 11/24/11 0800 11/24/11 4098          Assessment/Plan:  Pelvic abscess - likely diverticular s/p percutaneous drainage day #2.  Cultures being followed - on abx, bowel rest.  IR to continue to follow, no changes with drain at this time.   LOS: 3 days    CAMPBELL,PAMELA D 11/27/2011

## 2011-11-27 NOTE — Progress Notes (Signed)
Pt has been educated regarding correct use of Incentive Spirometer (IS). Pt verbalizes understanding of correct use. Pt states that there is no further questions regarding correct use of the IS. Darnell Jeschke Lorraine Malajah Oceguera, RN    

## 2011-11-28 LAB — GLUCOSE, CAPILLARY

## 2011-11-28 MED ORDER — LORAZEPAM 2 MG/ML IJ SOLN: 0.5000 mg | Freq: Four times a day (QID) | INTRAMUSCULAR | Status: DC

## 2011-11-28 MED ORDER — POTASSIUM CHLORIDE IN NACL 20-0.9 MEQ/L-% IV SOLN
INTRAVENOUS | Status: AC
  Administered 2011-11-28: 85 mL/h via INTRAVENOUS
  Administered 2011-11-29 – 2011-11-30 (×2): via INTRAVENOUS
  Filled 2011-11-28 (×5): qty 1000

## 2011-11-28 MED ORDER — INSULIN ASPART 100 UNIT/ML ~~LOC~~ SOLN
0.0000 [IU] | Freq: Three times a day (TID) | SUBCUTANEOUS | Status: DC
  Administered 2011-11-28 – 2011-12-09 (×8): 1 [IU] via SUBCUTANEOUS
  Filled 2011-11-28: qty 3

## 2011-11-28 MED ORDER — MORPHINE SULFATE 2 MG/ML IJ SOLN
2.0000 mg | INTRAMUSCULAR | Status: DC | PRN
  Administered 2011-11-28 (×4): 2 mg via INTRAVENOUS
  Administered 2011-11-29 (×3): 4 mg via INTRAVENOUS
  Administered 2011-11-29: 2 mg via INTRAVENOUS
  Administered 2011-11-29 – 2011-12-01 (×10): 4 mg via INTRAVENOUS
  Administered 2011-12-01: 2 mg via INTRAVENOUS
  Administered 2011-12-01 (×3): 4 mg via INTRAVENOUS
  Administered 2011-12-01: 2 mg via INTRAVENOUS
  Administered 2011-12-02: 4 mg via INTRAVENOUS
  Administered 2011-12-02 (×2): 2 mg via INTRAVENOUS
  Administered 2011-12-02 (×2): 4 mg via INTRAVENOUS
  Administered 2011-12-03: 2 mg via INTRAVENOUS
  Administered 2011-12-03 (×4): 4 mg via INTRAVENOUS
  Administered 2011-12-03: 2 mg via INTRAVENOUS
  Administered 2011-12-03 – 2011-12-04 (×9): 4 mg via INTRAVENOUS
  Administered 2011-12-05: 2 mg via INTRAVENOUS
  Administered 2011-12-05 (×4): 4 mg via INTRAVENOUS
  Administered 2011-12-05: 2 mg via INTRAVENOUS
  Administered 2011-12-05: 4 mg via INTRAVENOUS
  Administered 2011-12-06: 2 mg via INTRAVENOUS
  Administered 2011-12-06 – 2011-12-07 (×8): 4 mg via INTRAVENOUS
  Administered 2011-12-07 (×2): 2 mg via INTRAVENOUS
  Administered 2011-12-07 – 2011-12-08 (×13): 4 mg via INTRAVENOUS
  Administered 2011-12-09: 2 mg via INTRAVENOUS
  Administered 2011-12-09 – 2011-12-10 (×8): 4 mg via INTRAVENOUS
  Administered 2011-12-10: 2 mg via INTRAVENOUS
  Administered 2011-12-10 – 2011-12-11 (×4): 4 mg via INTRAVENOUS
  Filled 2011-11-28 (×13): qty 2
  Filled 2011-11-28: qty 1
  Filled 2011-11-28 (×3): qty 2
  Filled 2011-11-28: qty 1
  Filled 2011-11-28: qty 2
  Filled 2011-11-28: qty 1
  Filled 2011-11-28: qty 2
  Filled 2011-11-28: qty 1
  Filled 2011-11-28 (×5): qty 2
  Filled 2011-11-28: qty 1
  Filled 2011-11-28 (×9): qty 2
  Filled 2011-11-28: qty 1
  Filled 2011-11-28 (×3): qty 2
  Filled 2011-11-28: qty 1
  Filled 2011-11-28 (×2): qty 2
  Filled 2011-11-28: qty 1
  Filled 2011-11-28 (×2): qty 2
  Filled 2011-11-28: qty 1
  Filled 2011-11-28 (×3): qty 2
  Filled 2011-11-28: qty 1
  Filled 2011-11-28: qty 2
  Filled 2011-11-28 (×2): qty 1
  Filled 2011-11-28 (×10): qty 2
  Filled 2011-11-28: qty 1
  Filled 2011-11-28 (×3): qty 2
  Filled 2011-11-28: qty 1
  Filled 2011-11-28 (×4): qty 2
  Filled 2011-11-28 (×3): qty 1
  Filled 2011-11-28 (×6): qty 2
  Filled 2011-11-28: qty 1
  Filled 2011-11-28: qty 2
  Filled 2011-11-28: qty 1
  Filled 2011-11-28: qty 2
  Filled 2011-11-28: qty 1
  Filled 2011-11-28: qty 2

## 2011-11-28 MED ORDER — TRACE MINERALS CR-CU-MN-SE-ZN 10-1000-500-60 MCG/ML IV SOLN
INTRAVENOUS | Status: AC
  Administered 2011-11-28: 19:00:00 via INTRAVENOUS
  Filled 2011-11-28: qty 2000

## 2011-11-28 MED ORDER — SODIUM CHLORIDE 0.9 % IJ SOLN
10.0000 mL | INTRAMUSCULAR | Status: DC | PRN
  Administered 2011-12-07: 20 mL
  Administered 2011-12-10 – 2011-12-11 (×5): 10 mL

## 2011-11-28 MED ORDER — LORAZEPAM 2 MG/ML IJ SOLN
0.5000 mg | Freq: Four times a day (QID) | INTRAMUSCULAR | Status: DC | PRN
  Administered 2011-11-28 (×2): 0.5 mg via INTRAVENOUS
  Filled 2011-11-28 (×2): qty 1

## 2011-11-28 MED ORDER — MENTHOL 3 MG MT LOZG
1.0000 | LOZENGE | OROMUCOSAL | Status: DC | PRN
Start: 2011-11-28 — End: 2011-12-11
  Administered 2011-11-28 – 2011-11-29 (×2): 3 mg via ORAL
  Filled 2011-11-28: qty 9

## 2011-11-28 MED ORDER — FAT EMULSION 20 % IV EMUL
250.0000 mL | INTRAVENOUS | Status: AC
  Administered 2011-11-28: 250 mL via INTRAVENOUS
  Filled 2011-11-28: qty 250

## 2011-11-28 NOTE — Progress Notes (Signed)
PARENTERAL NUTRITION CONSULT NOTE - INITIAL  Pharmacy Consult for TNA Indication: Diverticulitis w/abscess, likely SBO/ileus  Allergies  Allergen Reactions  . Codeine Rash    Patient Measurements: Height: 5\' 9"  (175.3 cm) Weight: 115 lb (52.164 kg) IBW/kg (Calculated) : 70.7   Vital Signs: Temp: 97.7 F (36.5 C) (02/06 0636) Temp src: Oral (02/06 0636) BP: 161/64 mmHg (02/06 0636) Pulse Rate: 83  (02/06 0636) Intake/Output from previous day: 02/05 0701 - 02/06 0700 In: 3049 [I.V.:3019; NG/GT:30] Out: 2095 [Urine:975; Emesis/NG output:850; Drains:270] Intake/Output from this shift:    Labs:  [facility] 11/27/11 0415  WBC 8.1  HGB 11.8*  HCT 34.8*  PLT 227  APTT --  INR --     Basename 11/27/11 0415  NA 134*  K 3.6  CL 101  CO2 25  GLUCOSE 83  BUN 10  CREATININE 0.65  LABCREA --  CREAT24HRUR --  CALCIUM 8.4  MG 1.8  PHOS --  PROT 5.8*  ALBUMIN 2.3*  AST 21  ALT 8  ALKPHOS 61  BILITOT 0.2*  BILIDIR --  IBILI --  PREALBUMIN --  TRIG --  CHOLHDL --  CHOL --   Estimated Creatinine Clearance: 77 ml/min (by C-G formula based on Cr of 0.65).   Medical History: Past Medical History  Diagnosis Date  . COPD (chronic obstructive pulmonary disease)   . Pneumonia   . Hypertension     Medications:  Scheduled:    . ciprofloxacin  400 mg Intravenous Q12H  . enoxaparin  40 mg Subcutaneous Q24H  . metronidazole  500 mg Intravenous Q8H   Infusions:    . 0.9 % NaCl with KCl 20 mEq / L 125 mL/hr at 11/28/11 0653   PRN: albuterol, LORazepam, menthol-cetylpyridinium, morphine, ondansetron, phenol, DISCONTD: morphine    Current Nutrition:  NPO  Assessment:  56 y/o M NPO x 5 days due to diverticulitis w/abscess and likely SBO.  Prior recent dietary intake reportedly poor and current weight < IBW.   For PICC placement and initiation of TNA today.  Nutritional Goals:  Approximately 1700 kCal, 85 grams of protein per day  Plan:  At 1800  today:  Start Clinimix 5/20 at 69ml/hr + fat emulsion 20% at 25ml/hr(fat emulsion only on MWF due to ongoing shortage).  Plan to advance Clinimix as tolerated to a goal rate of 29ml/hr + fat emulsion 20% 10 ml/hr MWF to provide: 84 g/day protein and 1958 Kcal/day MWF, 1478 Kcal/day STTHS(Avg. 1684 Kcal/day).  TNA to contain standard multivitamins and trace elements (Only on MWF due to ongoing shortage).  Add thiamine 100mg  daily  Reduce IVF to 85 ml/hr.  Add SSI, insulin-sensitive scale .   TNA lab panels tomorrow, then Mondays & Thursdays.  F/u daily.   Elie Goody, PharmD, BCPS Pager; [phone] 11/28/2011  10:48 AM

## 2011-11-28 NOTE — Progress Notes (Signed)
Subjective: Peritoneal abscess Pelvic drain placed 2/4 Pt some better Really wants NG tube out and to eat.  Objective: Vital signs in last 24 hours: Temp:  [97.7 F (36.5 C)-98.9 F (37.2 C)] 97.7 F (36.5 C) (02/06 0636) Pulse Rate:  [75-83] 83  (02/06 0636) Resp:  [18] 18  (02/06 0636) BP: (117-161)/(64-77) 161/64 mmHg (02/06 0636) SpO2:  [90 %-97 %] 92 % (02/06 0740) Last BM Date: 11/26/11  Intake/Output from previous day: 02/05 0701 - 02/06 0700 In: 3049 [I.V.:3019; NG/GT:30] Out: 2095 [Urine:975; Emesis/NG output:850; Drains:270] Intake/Output this shift:    PE:  Pelvic drain intact Output 270 cc 2/5 Small amt in JP now; serous output Site clean and dry VSS; afeb  Lab Results:   [facility] 11/27/11 0415  WBC 8.1  HGB 11.8*  HCT 34.8*  PLT 227   BMET  Basename 11/27/11 0415  NA 134*  K 3.6  CL 101  CO2 25  GLUCOSE 83  BUN 10  CREATININE 0.65  CALCIUM 8.4   PT/INR No results found for this basename: LABPROT:2,INR:2 in the last 72 hours ABG No results found for this basename: PHART:2,PCO2:2,PO2:2,HCO3:2 in the last 72 hours  Studies/Results: Ct Guided Abscess Drain  11/26/2011  *RADIOLOGY REPORT*  Clinical Data: Bowel obstruction and peritoneal abscess located in the pelvis.  CT GUIDED DRAINAGE OF PERITONEAL ABSCESS  Sedation:  3.0 mg IV Versed;  150 mcg IV Fentanyl  Total Moderate Sedation Time: 35 minutes.  Procedure:  The procedure, risks, benefits, and alternatives were explained to the patient.  Questions regarding the procedure were encouraged and answered. The patient understands and consents to the procedure.  The abdominal wall was prepped with betadine in a sterile fashion, and a sterile drape was applied covering the operative field.  A sterile gown and sterile gloves were used for the procedure. Local anesthesia was provided with 1% Lidocaine.  CT was performed in a supine position.  Under CT guidance, an 18 gauge trocar needle was advanced  into the anterior pelvis. Aspiration of fluid was performed.  A fluid sample was sent for culture studies.  A guidewire was advanced through the needle.  The tract was dilated and a 12-French percutaneous drain advanced into the collection. The drain was retracted slightly and flushed with saline.  It was then connected to a suction bulb.  It was secured at the skin with a Prolene retention suture and Stat-Lock device.  Complications: None  Findings: Collection containing an air-fluid level was again localized in the anterior lower peritoneal cavity just above the bladder.  Aspiration yielded grossly purulent and feculent appearing fluid.  The drain was placed in the collection and is draining well to suction.  IMPRESSION: CT guided drainage of peritoneal abscess located in the pelvis with placement of 12-French drain.  Original Report Authenticated By: Reola Calkins, M.D.   Dg Naso G Tube Plc W/fl W/rad  11/26/2011  *RADIOLOGY REPORT*  Clinical Data: Small bowel obstruction.  NG tube placement needed.  NASO G TUEB PLACEMENT WITH FL AND WITH RAD  Fluoroscopy time:  1.12 minutes  Comparison: None.  Findings: Both nares were initially swabbed with lidocaine gel and the throat was sprayed with cetacaine.  Subsequently, a nasogastric tube was passed under intermittent fluoroscopic guidance into the stomach.  Proper placement of the tip the tube was confirmed with injection of approximately 15 ml of Omnipaque-300.  The tube was then flushed with water and secured in place.  IMPRESSION:  1.  Successful fluoroscopic  guided placement of nasogastric tube into the stomach, as above.  Original Report Authenticated By: Florencia Reasons, M.D.    Anti-infectives: Anti-infectives     Start     Dose/Rate Route Frequency Ordered Stop   11/24/11 1800   metroNIDAZOLE (FLAGYL) IVPB 500 mg        500 mg 100 mL/hr over 60 Minutes Intravenous Every 8 hours 11/24/11 1506     11/24/11 1509   ciprofloxacin (CIPRO) IVPB 400  mg        400 mg 200 mL/hr over 60 Minutes Intravenous Every 12 hours 11/24/11 1506     11/24/11 1000   ciprofloxacin (CIPRO) IVPB 400 mg        400 mg 200 mL/hr over 60 Minutes Intravenous  Once 11/24/11 0950 11/24/11 1106   11/24/11 1000   metroNIDAZOLE (FLAGYL) IVPB 500 mg        500 mg 100 mL/hr over 60 Minutes Intravenous  Once 11/24/11 0950 11/24/11 1212   11/24/11 0815   cefTRIAXone (ROCEPHIN) 1 g in dextrose 5 % 50 mL IVPB        1 g 100 mL/hr over 30 Minutes Intravenous  Once 11/24/11 0800 11/24/11 1610          Assessment/Plan:  Peritoneal abscess drain placed 2/4 Pelvic drain in place; intact Output significant Really wants NG tube out Plan per surgery     Laureen Frederic A 11/28/2011

## 2011-11-28 NOTE — Progress Notes (Signed)
INITIAL ADULT NUTRITION ASSESSMENT Date: 11/28/2011   Time: 3:57 PM Reason for Assessment: Consult - new TNA  ASSESSMENT: Male 56 y.o.  Dx: Generalized abdominal pain and nausea for the past 3-4 days  Hx:  Past Medical History  Diagnosis Date  . COPD (chronic obstructive pulmonary disease)   . Pneumonia   . Hypertension    Related Meds:  Scheduled Meds:   . ciprofloxacin  400 mg Intravenous Q12H  . enoxaparin  40 mg Subcutaneous Q24H  . insulin aspart  0-9 Units Subcutaneous Q8H  . metronidazole  500 mg Intravenous Q8H  . DISCONTD: LORazepam  0.5 mg Intravenous Q6H   Continuous Infusions:   . 0.9 % NaCl with KCl 20 mEq / L 125 mL/hr at 11/28/11 1400  . 0.9 % NaCl with KCl 20 mEq / L    . TPN (CLINIMIX) +/- additives     And  . fat emulsion     PRN Meds:.albuterol, LORazepam, menthol-cetylpyridinium, morphine, ondansetron, phenol, DISCONTD: LORazepam, DISCONTD: morphine  Ht: 5\' 9"  (175.3 cm)  Wt: 115 lb (52.164 kg)  Ideal Wt: 72.7kg % Ideal Wt: 72  Usual Wt: Pt does not know % Usual Wt: Pt does not know  Body mass index is 16.98 kg/(m^2).  Food/Nutrition Related Hx: Pt reports poor intake for the past 2 weeks PTA r/t abdominal pain and nausea and constipation. Pt thinks he has probably had some unintentional weight loss during this time period but is unsure how much. Pt found to have diverticular abscess and reactive ileus. NGT placed 11/26/11 and pt scheduled to have PICC placed and start TNA today as ileus persists and PA estimates diet not likely to be advanced for several more days.   CT of abdomen/pelvis on 11/24/11 showed: 1. Moderate sigmoid diverticulitis.  2. Diverticular abscess in the anterior left pelvis measuring 3.3 x  7.4 cm.  3. Distal small bowel obstruction with transition point in the  left lower quadrant, secondary to diverticulitis and abscess  4. Mild ascites.  5. Small bilateral inguinal hernias containing fat on the right  and a small bowel  loop on the left.  5. Small hiatal hernia.   Labs:  CMP     Component Value Date/Time   NA 134* 11/27/2011 0415   K 3.6 11/27/2011 0415   CL 101 11/27/2011 0415   CO2 25 11/27/2011 0415   GLUCOSE 83 11/27/2011 0415   BUN 10 11/27/2011 0415   CREATININE 0.65 11/27/2011 0415   CALCIUM 8.4 11/27/2011 0415   PROT 5.8* 11/27/2011 0415   ALBUMIN 2.3* 11/27/2011 0415   AST 21 11/27/2011 0415   ALT 8 11/27/2011 0415   ALKPHOS 61 11/27/2011 0415   BILITOT 0.2* 11/27/2011 0415   GFRNONAA >90 11/27/2011 0415   GFRAA >90 11/27/2011 0415   CBG (last 3)  No results found for this basename: GLUCAP:3 in the last 72 hours   Intake/Output Summary (Last 24 hours) at 11/28/11 1606 Last data filed at 11/28/11 1400  Gross per 24 hour  Intake   2967 ml  Output   1955 ml  Net   1012 ml   Last BM - 11/28/11 - small, brown  Diet Order:  NPO  IVF:    0.9 % NaCl with KCl 20 mEq / L Last Rate: 125 mL/hr at 11/28/11 1400  0.9 % NaCl with KCl 20 mEq / L   TPN (CLINIMIX) +/- additives   And   fat emulsion     Estimated Nutritional  Needs:   Kcal:1800-2050 Protein:80-95g Fluid:1.8-2L  NUTRITION DIAGNOSIS: -Inadequate oral intake (NI-2.1).  Status: Ongoing -Pt meets criteria for severe PCM of acute illness AEB <50% energy intake for the past 5 days and likely weight loss PTA and pt with BMI of 16.9  RELATED TO: abscess, ileus  AS EVIDENCE BY: MD progress notes  MONITORING/EVALUATION(Goals): TNA to meet >90% of estimated nutritional needs.   EDUCATION NEEDS: -No education needs identified at this time  INTERVENTION: TNA per pharmacy. Will monitor for diet advancement.   Dietitian #: [phone]  DOCUMENTATION CODES Per approved criteria  -Severe malnutrition in the context of acute illness or injury -Underweight    Marshall Cork 11/28/2011, 3:57 PM

## 2011-11-28 NOTE — Progress Notes (Signed)
Patient ambulating in halls Reports some flatus, but no significant bowel movements. Remains quite distended.  Reactive ileus Start TNA Continue perc drain/ IV antibiotics  Zachary Walls. Zachary Skains, MD, [facility]  11/28/2011 2:27 PM

## 2011-11-28 NOTE — Progress Notes (Signed)
Subjective: Pt passed some flatus and small BM. NG still irritating him and green bilious output. Also c/ mild edema of penis, but no pain and able to void well. HE still reports pain in low abdomen but feels better overall.  Objective: Vital signs in last 24 hours: Temp:  [97.7 F (36.5 C)-98.9 F (37.2 C)] 97.7 F (36.5 C) (02/06 0636) Pulse Rate:  [75-83] 83  (02/06 0636) Resp:  [18] 18  (02/06 0636) BP: (117-161)/(64-77) 161/64 mmHg (02/06 0636) SpO2:  [90 %-97 %] 92 % (02/06 0740) Last BM Date: 11/28/11  Intake/Output this shift:    Physical Exam: BP 161/64  Pulse 83  Temp(Src) 97.7 F (36.5 C) (Oral)  Resp 18  Ht 5\' 9"  (1.753 m)  Wt 52.164 kg (115 lb)  BMI 16.98 kg/m2  SpO2 92% Abdomen: remains distended. Few scattered BS, mild tenderness. Drain intact: looks serous, 270cc yesterday  Labs: CBC  Basename 11/27/11 0415  WBC 8.1  HGB 11.8*  HCT 34.8*  PLT 227   BMET  Basename 11/27/11 0415  NA 134*  K 3.6  CL 101  CO2 25  GLUCOSE 83  BUN 10  CREATININE 0.65  CALCIUM 8.4   LFT  Basename 11/27/11 0415  PROT 5.8*  ALBUMIN 2.3*  AST 21  ALT 8  ALKPHOS 61  BILITOT 0.2*  BILIDIR --  IBILI --  LIPASE --   PT/INR No results found for this basename: LABPROT:2,INR:2 in the last 72 hours ABG No results found for this basename: PHART:2,PCO2:2,PO2:2,HCO3:2 in the last 72 hours  Studies/Results: Ct Guided Abscess Drain  11/26/2011  *RADIOLOGY REPORT*  Clinical Data: Bowel obstruction and peritoneal abscess located in the pelvis.  CT GUIDED DRAINAGE OF PERITONEAL ABSCESS  Sedation:  3.0 mg IV Versed;  150 mcg IV Fentanyl  Total Moderate Sedation Time: 35 minutes.  Procedure:  The procedure, risks, benefits, and alternatives were explained to the patient.  Questions regarding the procedure were encouraged and answered. The patient understands and consents to the procedure.  The abdominal wall was prepped with betadine in a sterile fashion, and a sterile  drape was applied covering the operative field.  A sterile gown and sterile gloves were used for the procedure. Local anesthesia was provided with 1% Lidocaine.  CT was performed in a supine position.  Under CT guidance, an 18 gauge trocar needle was advanced into the anterior pelvis. Aspiration of fluid was performed.  A fluid sample was sent for culture studies.  A guidewire was advanced through the needle.  The tract was dilated and a 12-French percutaneous drain advanced into the collection. The drain was retracted slightly and flushed with saline.  It was then connected to a suction bulb.  It was secured at the skin with a Prolene retention suture and Stat-Lock device.  Complications: None  Findings: Collection containing an air-fluid level was again localized in the anterior lower peritoneal cavity just above the bladder.  Aspiration yielded grossly purulent and feculent appearing fluid.  The drain was placed in the collection and is draining well to suction.  IMPRESSION: CT guided drainage of peritoneal abscess located in the pelvis with placement of 12-French drain.  Original Report Authenticated By: Reola Calkins, M.D.   Dg Naso G Tube Plc W/fl W/rad  11/26/2011  *RADIOLOGY REPORT*  Clinical Data: Small bowel obstruction.  NG tube placement needed.  NASO G TUEB PLACEMENT WITH FL AND WITH RAD  Fluoroscopy time:  1.12 minutes  Comparison: None.  Findings: Both  nares were initially swabbed with lidocaine gel and the throat was sprayed with cetacaine.  Subsequently, a nasogastric tube was passed under intermittent fluoroscopic guidance into the stomach.  Proper placement of the tip the tube was confirmed with injection of approximately 15 ml of Omnipaque-300.  The tube was then flushed with water and secured in place.  IMPRESSION:  1.  Successful fluoroscopic guided placement of nasogastric tube into the stomach, as above.  Original Report Authenticated By: Florencia Reasons, M.D.     Assessment: Diverticulitis with abscess and likely associated small bowel obstruction  .  COPD (chronic obstructive pulmonary disease)    .  Pneumonia    .  Hypertension    Hernia repair  HOH   Plan: Await resolution of ileus. He has been NPO 5 days and states poor nutrition for about 2 weeks prior to admission. Likely PCM. Discussed PICC and initiation of TNA as ileus persists and it will be at least several more days before any substantial nutrition can be given po. Pain control IV abx.  LOS: 4 days    Alyse Low 11/28/2011 9:57 AM

## 2011-11-28 NOTE — Progress Notes (Signed)
Called Brayton El, Pa concerning clarification of Ativan order. New order given.

## 2011-11-28 NOTE — Progress Notes (Signed)
Walked in pt's room to find that his NG tube was partially removed. The tape that secured the tube to his nose was wrapped around the tube by his sternum. Advanced the tube back to where the tape touched his nose and secured in place. Auscultated with 20 ml of air to confirm placement and hooked tube back up to low intermittent wall suction.  Samara Snide [facility] 7:46 AM 11/28/2011

## 2011-11-29 LAB — GLUCOSE, CAPILLARY
Glucose-Capillary: 128 mg/dL — ABNORMAL HIGH (ref 70–99)
Glucose-Capillary: 127 mg/dL — ABNORMAL HIGH (ref 70–99)
Glucose-Capillary: 136 mg/dL — ABNORMAL HIGH (ref 70–99)

## 2011-11-29 LAB — DIFFERENTIAL
Basophils Absolute: 0.1 10*3/uL (ref 0.0–0.1)
Basophils Relative: 1 % (ref 0–1)
Lymphocytes Relative: 18 % (ref 12–46)
Lymphs Abs: 1.3 10*3/uL (ref 0.7–4.0)
Monocytes Relative: 19 % — ABNORMAL HIGH (ref 3–12)
Neutro Abs: 4.5 10*3/uL (ref 1.7–7.7)

## 2011-11-29 LAB — PREALBUMIN: Prealbumin: 3.5 mg/dL — ABNORMAL LOW (ref 17.0–34.0)

## 2011-11-29 LAB — CULTURE, ROUTINE-ABSCESS

## 2011-11-29 LAB — TRIGLYCERIDES: Triglycerides: 84 mg/dL (ref <150)

## 2011-11-29 LAB — COMPREHENSIVE METABOLIC PANEL
BUN: 6 mg/dL (ref 6–23)
Calcium: 8.3 mg/dL — ABNORMAL LOW (ref 8.4–10.5)
GFR calc Af Amer: >90 mL/min (ref >90)
Glucose, Bld: 118 mg/dL — ABNORMAL HIGH (ref 70–99)
Sodium: 129 mEq/L — ABNORMAL LOW (ref 135–145)
Total Protein: 6 g/dL (ref 6.0–8.3)

## 2011-11-29 LAB — CBC
HCT: 37.7 % — ABNORMAL LOW (ref 39.0–52.0)
Hemoglobin: 13.1 g/dL (ref 13.0–17.0)
MCHC: 34.7 g/dL (ref 30.0–36.0)
MCV: 87.9 fL (ref 78.0–100.0)
RDW: 14.6 % (ref 11.5–15.5)

## 2011-11-29 LAB — CHOLESTEROL, TOTAL: Cholesterol: 73 mg/dL (ref 0–200)

## 2011-11-29 LAB — PHOSPHORUS: Phosphorus: 2.2 mg/dL — ABNORMAL LOW (ref 2.3–4.6)

## 2011-11-29 MED ORDER — ALBUTEROL SULFATE (5 MG/ML) 0.5% IN NEBU
2.5000 mg | INHALATION_SOLUTION | Freq: Four times a day (QID) | RESPIRATORY_TRACT | Status: DC
  Filled 2011-11-29: qty 0.5

## 2011-11-29 MED ORDER — DEXTROSE 10 % IV SOLN
INTRAVENOUS | Status: AC
  Administered 2011-11-29: 15:00:00 via INTRAVENOUS

## 2011-11-29 MED ORDER — LORAZEPAM 2 MG/ML IJ SOLN
1.0000 mg | Freq: Four times a day (QID) | INTRAMUSCULAR | Status: DC | PRN
  Administered 2011-11-29 – 2011-12-02 (×12): 1 mg via INTRAVENOUS
  Filled 2011-11-29 (×13): qty 1

## 2011-11-29 MED ORDER — THIAMINE HCL 100 MG/ML IJ SOLN
INTRAVENOUS | Status: AC
  Administered 2011-11-29: 17:00:00 via INTRAVENOUS
  Filled 2011-11-29: qty 2000

## 2011-11-29 MED ORDER — POTASSIUM PHOSPHATE DIBASIC 3 MMOLE/ML IV SOLN
20.0000 mmol | Freq: Once | INTRAVENOUS | Status: AC
  Administered 2011-11-29: 20 mmol via INTRAVENOUS
  Filled 2011-11-29: qty 6.67

## 2011-11-29 MED ORDER — MAGNESIUM SULFATE 40 MG/ML IJ SOLN
2.0000 g | Freq: Once | INTRAMUSCULAR | Status: AC
  Administered 2011-11-29: 2 g via INTRAVENOUS
  Filled 2011-11-29: qty 50

## 2011-11-29 NOTE — Progress Notes (Signed)
CNA called to the Nurses Station to notify the nurse come to the patients room. Upon entering the patient room the patient was observed to have disconnected all his IVF, TPN, & Lipids from PICC (RUA). The patient had also disconnected NG Tube from suction & JP drain at the connection. The patient stated, "I had to go to the bathroom & I had to disconnect all my tubes". The patient was reminded that he still needs to ring for assistance & to not disconnect his tubings d/t risk of infection. The MD Dwain Sarna) made aware & new order given to increase Ativan to 1 mg Q6h, prn.,& that staff could use Busy Mitts, & reconnect TPN/Lipids. Pharmacist (JC) made aware that the patient had disconnected TPN/Lipids & the need for another set of fluids (TPN/Lipids) to reconnect by MD([place]). The pharmacist Kem Boroughs) advised that TPN/Lipids could not be provided at this time & that he would send up D10 to be hung until TPN/Lipids are available per protocol & that since the original had been disconnected by the patient it needed to be discarded. Will pass on to Oncoming Nurse & continue to monitor the patient. Bennetta Laos, RN

## 2011-11-29 NOTE — Progress Notes (Signed)
  Subjective: Patient feeling better overall; wants NG out and to eat; having BM's; some pelvic tenderness and scrotal edema noted  Objective: Vital signs in last 24 hours: Temp:  [98.3 F (36.8 C)-99 F (37.2 C)] 98.3 F (36.8 C) (02/07 1010) Pulse Rate:  [84-91] 84  (02/07 1010) Resp:  [16-20] 16  (02/07 1010) BP: (124-149)/(77-91) 124/77 mmHg (02/07 1010) SpO2:  [93 %-98 %] 98 % (02/07 1010) Last BM Date: 11/28/11  Intake/Output from previous day: 02/06 0701 - 02/07 0700 In: 2081.5 [I.V.:1772.5; NG/GT:10; TPN:289] Out: 2130 [Urine:1550; Emesis/NG output:550; Drains:30] Intake/Output this shift: Total I/O In: -  Out: 250 [Urine:250]  Pelvic drain intact, insertion site ok, mildly tender, output about 30 cc's today, cx's -candida  Lab Results:   [facility] 11/29/11 0550 11/27/11 0415  WBC 7.4 8.1  HGB 13.1 11.8*  HCT 37.7* 34.8*  PLT 323 227   BMET  Basename 11/29/11 0550 11/27/11 0415  NA 129* 134*  K 3.3* 3.6  CL 95* 101  CO2 27 25  GLUCOSE 118* 83  BUN 6 10  CREATININE 0.64 0.65  CALCIUM 8.3* 8.4   PT/INR No results found for this basename: LABPROT:2,INR:2 in the last 72 hours ABG No results found for this basename: PHART:2,PCO2:2,PO2:2,HCO3:2 in the last 72 hours  Studies/Results: Results for orders placed during the hospital encounter of 11/24/11  CULTURE, ROUTINE-ABSCESS     Status: Normal   Collection Time   11/26/11 12:06 PM      Component Value Range Status Comment   Specimen Description ABSCESS PELVIC   Final    Special Requests NONE   Final    Gram Stain     Final    Value: FEW WBC PRESENT,BOTH PMN AND MONONUCLEAR     NO SQUAMOUS EPITHELIAL CELLS SEEN     FEW GRAM POSITIVE COCCI     IN PAIRS RARE GRAM POSITIVE RODS   Culture FEW CANDIDA ALBICANS   Final    Report Status 11/29/2011 FINAL   Final     Anti-infectives: Anti-infectives     Start     Dose/Rate Route Frequency Ordered Stop   11/24/11 1800   metroNIDAZOLE (FLAGYL) IVPB 500 mg         500 mg 100 mL/hr over 60 Minutes Intravenous Every 8 hours 11/24/11 1506     11/24/11 1509   ciprofloxacin (CIPRO) IVPB 400 mg        400 mg 200 mL/hr over 60 Minutes Intravenous Every 12 hours 11/24/11 1506     11/24/11 1000   ciprofloxacin (CIPRO) IVPB 400 mg        400 mg 200 mL/hr over 60 Minutes Intravenous  Once 11/24/11 0950 11/24/11 1106   11/24/11 1000   metroNIDAZOLE (FLAGYL) IVPB 500 mg        500 mg 100 mL/hr over 60 Minutes Intravenous  Once 11/24/11 0950 11/24/11 1212   11/24/11 0815   cefTRIAXone (ROCEPHIN) 1 g in dextrose 5 % 50 mL IVPB        1 g 100 mL/hr over 30 Minutes Intravenous  Once 11/24/11 0800 11/24/11 9604          Assessment/Plan: s/p pelvic abscess drain 2/4; check f/u CT early next week, ? short term antifungal; other plans as per [facility]   LOS: 5 days    Lisa Blakeman,D [facility] 11/29/2011

## 2011-11-29 NOTE — Progress Notes (Signed)
  Subjective: Pulled PICC out last pm. TNA on hold until replaced. No further BM but he states he is passing flatus. Deneis CP, SOB  Objective: Vital signs in last 24 hours: Temp:  [98.7 F (37.1 C)-99 F (37.2 C)] 98.8 F (37.1 C) (02/07 0620) Pulse Rate:  [87-91] 87  (02/07 0620) Resp:  [18-20] 18  (02/07 0620) BP: (131-149)/(80-91) 139/80 mmHg (02/07 0620) SpO2:  [93 %-96 %] 96 % (02/07 0620) Last BM Date: 11/28/11  Intake/Output this shift: Total I/O In: -  Out: 200 [Urine:200]  Physical Exam: BP 139/80  Pulse 87  Temp(Src) 98.8 F (37.1 C) (Oral)  Resp 18  Ht 5\' 9"  (1.753 m)  Wt 52.164 kg (115 lb)  BMI 16.98 kg/m2  SpO2 96% Abdomen: remains distended but soft. NT Drain intact, thin brown/rust colored output, much less recorded than previous day.  Labs: CBC  Basename 11/29/11 0550 11/27/11 0415  WBC 7.4 8.1  HGB 13.1 11.8*  HCT 37.7* 34.8*  PLT 323 227   BMET  Basename 11/29/11 0550 11/27/11 0415  NA 129* 134*  K 3.3* 3.6  CL 95* 101  CO2 27 25  GLUCOSE 118* 83  BUN 6 10  CREATININE 0.64 0.65  CALCIUM 8.3* 8.4   LFT  Basename 11/29/11 0550  PROT 6.0  ALBUMIN 2.4*  AST 20  ALT 8  ALKPHOS 39  BILITOT 0.3  BILIDIR --  IBILI --  LIPASE --   PT/INR No results found for this basename: LABPROT:2,INR:2 in the last 72 hours ABG No results found for this basename: PHART:2,PCO2:2,PO2:2,HCO3:2 in the last 72 hours  Studies/Results: No results found.  Assessment:  Diverticulitis with abscess and likely associated small bowel obstruction  .  COPD (chronic obstructive pulmonary disease)    .  Pneumonia    .  Hypertension    Hernia repair  HOH  Plan:  Await resolution of ileus. He has been NPO 6 days and states poor nutrition for about 2 weeks prior to admission. Likely PCM-continue TNA per pharmacy Replete K Pain control  IV abx.    LOS: 5 days    Alyse Low 11/29/2011 9:44 AM

## 2011-11-29 NOTE — Progress Notes (Signed)
PARENTERAL NUTRITION CONSULT NOTE - Follow-Up  Pharmacy Consult for TNA Indication: Diverticulitis w/abscess, likely SBO/ileus  Allergies  Allergen Reactions  . Codeine Rash    Patient Measurements: Height: 5\' 9"  (175.3 cm) Weight: 115 lb (52.164 kg) IBW/kg (Calculated) : 70.7   Vital Signs: Temp: 98.8 F (37.1 C) (02/07 0620) Temp src: Oral (02/07 0620) BP: 139/80 mmHg (02/07 0620) Pulse Rate: 87  (02/07 0620) Intake/Output from previous day: 02/06 0701 - 02/07 0700 In: 2081.5 [I.V.:1772.5; NG/GT:10; TPN:289] Out: 1680 [Urine:1100; Emesis/NG output:550; Drains:30]    Labs:  [facility] 11/29/11 0550 11/27/11 0415  WBC 7.4 8.1  HGB 13.1 11.8*  HCT 37.7* 34.8*  PLT 323 227  APTT -- --  INR -- --     Basename 11/29/11 0550 11/27/11 0415  NA 129* 134*  K 3.3* 3.6  CL 95* 101  CO2 27 25  GLUCOSE 118* 83  BUN 6 10  CREATININE 0.64 0.65  LABCREA -- --  CREAT24HRUR -- --  CALCIUM 8.3* 8.4  MG 1.6 1.8  PHOS 2.2* --  PROT 6.0 5.8*  ALBUMIN 2.4* 2.3*  AST 20 21  ALT 8 8  ALKPHOS 39 61  BILITOT 0.3 0.2*  BILIDIR -- --  IBILI -- --  PREALBUMIN -- --  TRIG 84 --  CHOLHDL -- --  CHOL 73 --   Estimated Creatinine Clearance: 77 ml/min (by C-G formula based on Cr of 0.64).   Medical History: Past Medical History  Diagnosis Date  . COPD (chronic obstructive pulmonary disease)   . Pneumonia   . Hypertension     Medications:  Scheduled:     . ciprofloxacin  400 mg Intravenous Q12H  . enoxaparin  40 mg Subcutaneous Q24H  . insulin aspart  0-9 Units Subcutaneous Q8H  . metronidazole  500 mg Intravenous Q8H  . DISCONTD: LORazepam  0.5 mg Intravenous Q6H   Infusions:     . 0.9 % NaCl with KCl 20 mEq / L 125 mL/hr at 11/28/11 1710  . 0.9 % NaCl with KCl 20 mEq / L 85 mL/hr (11/28/11 1800)  . TPN (CLINIMIX) +/- additives 40 mL/hr at 11/28/11 1843   And  . fat emulsion 250 mL (11/28/11 1844)   PRN: albuterol, LORazepam, menthol-cetylpyridinium,  morphine, ondansetron, phenol, sodium chloride, DISCONTD: LORazepam, DISCONTD: LORazepam, DISCONTD: morphine    Current Nutrition:   Patient pulled out IV line last night.  Because TPN and lipids were then potentially contaminated, these were discarded per policy and D-10-W was started until new bag TPN can be supplied.  NPO except ice chips  Nutritional Goals:  Per RD assessment 11/28/11:  Kcal:1800-2050  Protein:80-95g    Assessment:  56 y/o M NPO due to diverticulitis w/abscess and likely SBO.    TNA/Lipids interrupted last night because patient pulled out IV line.  RD recommendations noted  K and Phos low; Mg borderline.  Plan:   Resume Clinimix 5/20 at 71ml/hr.  Fat emulsion 20% at 38mL/hr on MWF only due to ongoing shortage).  Advance Clinimix as tolerated to new goal rate (based on RD recommendations) of 32ml/hr + fat emulsion 20% 10 ml/hr MWF to provide: 90 g/day protein and 2064 Kcal/day MWF, 1584 Kcal/day STTHS(Avg. 1790 Kcal/day).  TNA to contain standard multivitamins and trace elements (Only on MWF due to ongoing shortage).  Add thiamine 100mg  dailly.   KPhos 20mM IV x 1  Mag Sulfate 2 Grams IV x 1  Recheck BMet, Mg, Phos tomorrow.   Elie Goody, PharmD,  BCPS Pager: [phone] 11/29/2011  7:45 AM

## 2011-11-29 NOTE — Progress Notes (Signed)
Agree Wilmon Arms. Corliss Skains, MD, [facility]  11/29/2011 5:40 PM

## 2011-11-30 DIAGNOSIS — D649 Anemia, unspecified: Secondary | ICD-10-CM

## 2011-11-30 LAB — BASIC METABOLIC PANEL
CO2: 31 mEq/L (ref 19–32)
Chloride: 95 mEq/L — ABNORMAL LOW (ref 96–112)
GFR calc Af Amer: >90 mL/min (ref >90)
Potassium: 3.1 mEq/L — ABNORMAL LOW (ref 3.5–5.1)

## 2011-11-30 LAB — URINALYSIS, ROUTINE W REFLEX MICROSCOPIC
Bilirubin Urine: NEGATIVE
Hgb urine dipstick: NEGATIVE
Ketones, ur: NEGATIVE mg/dL
Nitrite: NEGATIVE
Protein, ur: NEGATIVE mg/dL
Urobilinogen, UA: 0.2 mg/dL (ref 0.0–1.0)

## 2011-11-30 LAB — GLUCOSE, CAPILLARY
Glucose-Capillary: 116 mg/dL — ABNORMAL HIGH (ref 70–99)
Glucose-Capillary: 118 mg/dL — ABNORMAL HIGH (ref 70–99)
Glucose-Capillary: 112 mg/dL — ABNORMAL HIGH (ref 70–99)

## 2011-11-30 LAB — MAGNESIUM: Magnesium: 1.8 mg/dL (ref 1.5–2.5)

## 2011-11-30 LAB — PHOSPHORUS: Phosphorus: 2.9 mg/dL (ref 2.3–4.6)

## 2011-11-30 MED ORDER — ALBUTEROL SULFATE (5 MG/ML) 0.5% IN NEBU
2.5000 mg | INHALATION_SOLUTION | Freq: Four times a day (QID) | RESPIRATORY_TRACT | Status: DC
  Administered 2011-11-30 – 2011-12-02 (×8): 2.5 mg via RESPIRATORY_TRACT
  Filled 2011-11-30 (×10): qty 0.5

## 2011-11-30 MED ORDER — FAT EMULSION 20 % IV EMUL
250.0000 mL | INTRAVENOUS | Status: AC
  Administered 2011-11-30: 250 mL via INTRAVENOUS
  Filled 2011-11-30: qty 250

## 2011-11-30 MED ORDER — POTASSIUM CHLORIDE IN NACL 20-0.9 MEQ/L-% IV SOLN
INTRAVENOUS | Status: DC
  Administered 2011-11-30: 20:00:00 via INTRAVENOUS
  Filled 2011-11-30 (×3): qty 1000

## 2011-11-30 MED ORDER — POTASSIUM CHLORIDE 10 MEQ/100ML IV SOLN
10.0000 meq | INTRAVENOUS | Status: AC
  Administered 2011-11-30 (×4): 10 meq via INTRAVENOUS
  Filled 2011-11-30 (×4): qty 100

## 2011-11-30 MED ORDER — THIAMINE HCL 100 MG/ML IJ SOLN
100.0000 mg | Freq: Every day | INTRAMUSCULAR | Status: AC
  Administered 2011-11-30 – 2011-12-01 (×2): 100 mg via INTRAVENOUS
  Filled 2011-11-30 (×3): qty 1

## 2011-11-30 MED ORDER — TRACE MINERALS CR-CU-MN-SE-ZN 10-1000-500-60 MCG/ML IV SOLN
INTRAVENOUS | Status: AC
  Administered 2011-11-30: 19:00:00 via INTRAVENOUS
  Filled 2011-11-30: qty 2000

## 2011-11-30 NOTE — Progress Notes (Signed)
  Subjective: Pelvic drain placed 2/4 Some better Still wants ND out  Objective: Vital signs in last 24 hours: Temp:  [98 F (36.7 C)-98.3 F (36.8 C)] 98.3 F (36.8 C) (02/08 0614) Pulse Rate:  [72-78] 78  (02/08 0614) Resp:  [18] 18  (02/08 0614) BP: (115-142)/(68-83) 115/68 mmHg (02/08 0614) SpO2:  [95 %-98 %] 95 % (02/08 0808) FiO2 (%):  [21 %] 21 % (02/08 0302) Last BM Date: 11/30/11  Intake/Output from previous day: 02/07 0701 - 02/08 0700 In: 1916 [I.V.:1186; IV Piggyback:250; TPN:480] Out: 3394.7 [Urine:2900; Emesis/NG output:450; Drains:44.7] Intake/Output this shift: Total I/O In: -  Out: 600 [Urine:300; Emesis/NG output:300]  PE:  Drain intact Output 45 cc 2/7 Small amt brown fluid in JP now Skin w/o sign of inf NT drain site Afeb; VSS; wbc wnl  Lab Results:   [facility] 11/29/11 0550  WBC 7.4  HGB 13.1  HCT 37.7*  PLT 323   BMET  Basename 11/30/11 0510 11/29/11 0550  NA 131* 129*  K 3.1* 3.3*  CL 95* 95*  CO2 31 27  GLUCOSE 121* 118*  BUN 4* 6  CREATININE 0.59 0.64  CALCIUM 7.8* 8.3*   PT/INR No results found for this basename: LABPROT:2,INR:2 in the last 72 hours ABG No results found for this basename: PHART:2,PCO2:2,PO2:2,HCO3:2 in the last 72 hours  Studies/Results: No results found.  Anti-infectives: Anti-infectives     Start     Dose/Rate Route Frequency Ordered Stop   11/24/11 1800   metroNIDAZOLE (FLAGYL) IVPB 500 mg        500 mg 100 mL/hr over 60 Minutes Intravenous Every 8 hours 11/24/11 1506     11/24/11 1509   ciprofloxacin (CIPRO) IVPB 400 mg        400 mg 200 mL/hr over 60 Minutes Intravenous Every 12 hours 11/24/11 1506     11/24/11 1000   ciprofloxacin (CIPRO) IVPB 400 mg        400 mg 200 mL/hr over 60 Minutes Intravenous  Once 11/24/11 0950 11/24/11 1106   11/24/11 1000   metroNIDAZOLE (FLAGYL) IVPB 500 mg        500 mg 100 mL/hr over 60 Minutes Intravenous  Once 11/24/11 0950 11/24/11 1212   11/24/11 0815    cefTRIAXone (ROCEPHIN) 1 g in dextrose 5 % 50 mL IVPB        1 g 100 mL/hr over 30 Minutes Intravenous  Once 11/24/11 0800 11/24/11 5784          Assessment/Plan: Pelvic abscess drain placed 2/4 Pt some better Drain intact Output still significant Re Ct when output 10-15 cc/ 24hrs Plan per surgery      Nicholaus Steinke A 11/30/2011

## 2011-11-30 NOTE — Progress Notes (Signed)
Continue perc drain Await return of bowel function - decreasing NG output.  Repeat CT Monday  Wilmon Arms. Corliss Skains, MD, [facility]  11/30/2011 12:48 PM

## 2011-11-30 NOTE — Progress Notes (Signed)
Nutrition Follow-up  Diet Order: NPO  TPN: Clinimix E 5/20 @ 40 ml/hr.  Lipids (20% IVFE @ 10 ml/hr), multivitamins, and trace elements are provided 3 times weekly (MWF) due to national backorder.  Provides 1684 kcal and 84 grams protein daily (based on weekly average).  Meets 93% minimum estimated kcal and 100% minimum estimated protein needs.  Additional IVF with NS @ 85 ml/hr.  - Noted recent events of pt pulling out PICC yesterday. Attempted to meet with pt, however he was asleep. Noted PA reports pt with distended abdomen. Awaiting return of bowel function.   Meds: Scheduled Meds:   . albuterol  2.5 mg Nebulization Q6H WA  . ciprofloxacin  400 mg Intravenous Q12H  . enoxaparin  40 mg Subcutaneous Q24H  . insulin aspart  0-9 Units Subcutaneous Q8H  . magnesium sulfate 1 - 4 g bolus IVPB  2 g Intravenous Once  . metronidazole  500 mg Intravenous Q8H  . potassium chloride  10 mEq Intravenous Q1 Hr x 4  . potassium phosphate IVPB (mmol)  20 mmol Intravenous Once  . thiamine  100 mg Intravenous Daily  . DISCONTD: albuterol  2.5 mg Nebulization Q6H   Continuous Infusions:   . 0.9 % NaCl with KCl 20 mEq / L 85 mL/hr at 11/30/11 0630  . 0.9 % NaCl with KCl 20 mEq / L    . dextrose 40 mL/hr at 11/29/11 1515  . TPN (CLINIMIX) +/- additives 40 mL/hr at 11/28/11 1843   And  . fat emulsion 250 mL (11/28/11 1844)  . TPN (CLINIMIX) +/- additives     And  . fat emulsion    . TPN (CLINIMIX) +/- additives 40 mL/hr at 11/30/11 0600   PRN Meds:.albuterol, LORazepam, menthol-cetylpyridinium, morphine, ondansetron, phenol, sodium chloride  Labs:  CMP     Component Value Date/Time   NA 131* 11/30/2011 0510   K 3.1* 11/30/2011 0510   CL 95* 11/30/2011 0510   CO2 31 11/30/2011 0510   GLUCOSE 121* 11/30/2011 0510   BUN 4* 11/30/2011 0510   CREATININE 0.59 11/30/2011 0510   CALCIUM 7.8* 11/30/2011 0510   PROT 6.0 11/29/2011 0550   ALBUMIN 2.4* 11/29/2011 0550   AST 20 11/29/2011 0550   ALT 8 11/29/2011 0550   ALKPHOS 39 11/29/2011 0550   BILITOT 0.3 11/29/2011 0550   GFRNONAA >90 11/30/2011 0510   GFRAA >90 11/30/2011 0510   CBG (last 3)   Basename 11/30/11 0618 11/29/11 2210 11/29/11 1503  GLUCAP 116* 136* 127*   2/6 PALB 3.5  - Noted Phos and Mg were repleted - Potassium remains low -  Noted KCl IV q1h x 4 ordered - Pt continues to get thiamine with TPN - CBGs <150   Intake/Output Summary (Last 24 hours) at 11/30/11 1525 Last data filed at 11/30/11 1512  Gross per 24 hour  Intake   2210 ml  Output   4330 ml  Net  -2120 ml  NGT - total x 15 hours Pelvic drain - 20ml x 15 hours Last BM - 11/30/11  Weight Status: No new weights  Nutrition Dx: Inadequate oral intake - ongoing  Goal: TNA to meet >90% of estimated nutritional needs - met  Intervention: TNA per pharmacy. Diet advancement per MD. Education not appropriate, pt asleep.   Monitor: Weight, labs, TNA, diet advancement, return of bowel function   Marshall Cork Pager #: [phone]

## 2011-11-30 NOTE — Plan of Care (Signed)
Problem: Inadequate Intake (NI-2.1) Goal: Food and/or nutrient delivery Individualized approach for food/nutrient provision.  Outcome: Not Progressing Pt remains NPO

## 2011-11-30 NOTE — Progress Notes (Signed)
Subjective: 45 Ml thru drain yesterday, 450 thru his NG yesterday. Note he can't get to BR fast enough when he needs to void.  Wants to eat and get NG out.  Objective: Vital signs in last 24 hours: Temp:  [98 F (36.7 C)-98.3 F (36.8 C)] 98.3 F (36.8 C) (02/08 0614) Pulse Rate:  [72-84] 78  (02/08 0614) Resp:  [16-18] 18  (02/08 0614) BP: (115-142)/(68-83) 115/68 mmHg (02/08 0614) SpO2:  [96 %-98 %] 96 % (02/08 0614) FiO2 (%):  [21 %] 21 % (02/08 0302) Last BM Date: 11/28/11  Intake/Output from previous day: 02/07 0701 - 02/08 0700 In: 1916 [I.V.:1186; IV Piggyback:250; TPN:480] Out: 3394.7 [Urine:2900; Emesis/NG output:450; Drains:44.7] Intake/Output this shift:    YN:WGNFA  Getting HHN, Chest is clear.  Abd: Distended, but not tender,  Few BS, Drain in JP is clear. Penile and scrotal swelling is better per patient.  Lab Results:   [facility] 11/29/11 0550  WBC 7.4  HGB 13.1  HCT 37.7*  PLT 323    BMET  Basename 11/30/11 0510 11/29/11 0550  NA 131* 129*  K 3.1* 3.3*  CL 95* 95*  CO2 31 27  GLUCOSE 121* 118*  BUN 4* 6  CREATININE 0.59 0.64  CALCIUM 7.8* 8.3*   PT/INR No results found for this basename: LABPROT:2,INR:2 in the last 72 hours   Studies/Results: No results found.  Anti-infectives: Anti-infectives     Start     Dose/Rate Route Frequency Ordered Stop   11/24/11 1800   metroNIDAZOLE (FLAGYL) IVPB 500 mg        500 mg 100 mL/hr over 60 Minutes Intravenous Every 8 hours 11/24/11 1506     11/24/11 1509   ciprofloxacin (CIPRO) IVPB 400 mg        400 mg 200 mL/hr over 60 Minutes Intravenous Every 12 hours 11/24/11 1506     11/24/11 1000   ciprofloxacin (CIPRO) IVPB 400 mg        400 mg 200 mL/hr over 60 Minutes Intravenous  Once 11/24/11 0950 11/24/11 1106   11/24/11 1000   metroNIDAZOLE (FLAGYL) IVPB 500 mg        500 mg 100 mL/hr over 60 Minutes Intravenous  Once 11/24/11 0950 11/24/11 1212   11/24/11 0815   cefTRIAXone (ROCEPHIN) 1 g  in dextrose 5 % 50 mL IVPB        1 g 100 mL/hr over 30 Minutes Intravenous  Once 11/24/11 0800 11/24/11 2130         Current Facility-Administered Medications  Medication Dose Route Frequency Provider Last Rate Last Dose  . 0.9 % NaCl with KCl 20 mEq/ L  infusion   Intravenous Continuous Randall K Absher, PHARMD 85 mL/hr at 11/30/11 0630    . albuterol (PROVENTIL) (5 MG/ML) 0.5% nebulizer solution 2.5 mg  2.5 mg Nebulization Q2H PRN Rulon Abide, DO   2.5 mg at 11/29/11 1948  . albuterol (PROVENTIL) (5 MG/ML) 0.5% nebulizer solution 2.5 mg  2.5 mg Nebulization Q6H WA Rulon Abide, DO   2.5 mg at 11/30/11 0300  . ciprofloxacin (CIPRO) IVPB 400 mg  400 mg Intravenous Q12H Rulon Abide, DO   400 mg at 11/30/11 8657  . dextrose 10 % infusion   Intravenous Continuous Randall K Absher, PHARMD 40 mL/hr at 11/29/11 1515    . enoxaparin (LOVENOX) injection 40 mg  40 mg Subcutaneous Q24H Rulon Abide, DO   40 mg at 11/29/11 2220  . tpn solution [facility]  E 5/20) 2,000 mL with multivitamins adult 10 mL, trace elements Cr-Cu-Mn-Se-Zn 1 mL, thiamine 100 mg infusion   Intravenous Continuous TPN Ky Barban Absher, PHARMD 40 mL/hr at 11/28/11 1843     And  . fat emulsion 20 % infusion 250 mL  250 mL Intravenous Continuous TPN Ky Barban Absher, PHARMD 10 mL/hr at 11/28/11 1844 250 mL at 11/28/11 1844  . insulin aspart (novoLOG) injection 0-9 Units  0-9 Units Subcutaneous Q8H Randall K Absher, PHARMD   1 Units at 11/29/11 2217  . LORazepam (ATIVAN) injection 1 mg  1 mg Intravenous Q6H PRN Emelia Loron, MD   1 mg at 11/30/11 0234  . magnesium sulfate IVPB 2 g 50 mL  2 g Intravenous Once Randall K Absher, PHARMD   2 g at 11/29/11 1647  . menthol-cetylpyridinium (CEPACOL) lozenge 3 mg  1 lozenge Oral PRN Marianna Fuss, PA   3 mg at 11/29/11 1137  . metroNIDAZOLE (FLAGYL) IVPB 500 mg  500 mg Intravenous Q8H Rulon Abide, DO   500 mg at 11/30/11 0354  . morphine 2 MG/ML  injection 2-4 mg  2-4 mg Intravenous Q2H PRN Marianna Fuss, PA   4 mg at 11/30/11 0741  . ondansetron (ZOFRAN) injection 4 mg  4 mg Intravenous Q6H PRN Rulon Abide, DO   4 mg at 11/27/11 0950  . phenol (CHLORASEPTIC) mouth spray 1 spray  1 spray Mouth/Throat PRN Wilmon Arms. Tsuei, MD   1 spray at 11/26/11 1735  . potassium chloride 10 mEq in 100 mL IVPB  10 mEq Intravenous Q1 Hr x 4 Randall K Absher, PHARMD      . potassium phosphate 20 mmol in dextrose 5 % 500 mL infusion  20 mmol Intravenous Once Randall K Absher, PHARMD   20 mmol at 11/29/11 1022  . sodium chloride 0.9 % injection 10-40 mL  10-40 mL Intracatheter PRN Marianna Fuss, PA      . tpn solution (CLINIMIX E 5/20) 2,000 mL with thiamine 100 mg infusion   Intravenous Continuous TPN Randall K Absher, PHARMD 40 mL/hr at 11/30/11 0600    . DISCONTD: albuterol (PROVENTIL) (5 MG/ML) 0.5% nebulizer solution 2.5 mg  2.5 mg Nebulization Q6H Rulon Abide, DO        Assessment/Plan Diverticulitis with abscess and likely associated small bowel obstruction  .  COPD (chronic obstructive pulmonary disease)    .  Pneumonia    .  Hypertension    Hernia repair  HOH  Percutaneous Drain 11/26/11/IR  Plan;  Check urine, Continue NG drainage, he says he's passing gas but remains very distended. He's on TNA., labs ordered for Monday.     LOS: 6 days    Amberlie Gaillard 11/30/2011

## 2011-11-30 NOTE — Progress Notes (Addendum)
PARENTERAL NUTRITION CONSULT NOTE - Follow-Up  Pharmacy Consult for TNA Indication: Diverticulitis w/abscess, likely SBO/ileus  Allergies  Allergen Reactions  . Codeine Rash    Patient Measurements: Height: 5\' 9"  (175.3 cm) Weight: 115 lb (52.164 kg) IBW/kg (Calculated) : 70.7   Vital Signs: Temp: 98.3 F (36.8 C) (02/08 0614) Temp src: Oral (02/08 0614) BP: 115/68 mmHg (02/08 0614) Pulse Rate: 78  (02/08 0614)  Intake/Output from previous day: 02/07 0701 - 02/08 0700 In: 1916 [I.V.:1186; IV Piggyback:250; TPN:480] Out: 3394.7 [Urine:2900; Emesis/NG output:450; Drains:44.7]    Labs:  [facility] 11/29/11 0550  WBC 7.4  HGB 13.1  HCT 37.7*  PLT 323  APTT --  INR --     Basename 11/30/11 0510 11/29/11 0550  NA 131* 129*  K 3.1* 3.3*  CL 95* 95*  CO2 31 27  GLUCOSE 121* 118*  BUN 4* 6  CREATININE 0.59 0.64  LABCREA -- --  CREAT24HRUR -- --  CALCIUM 7.8* 8.3*  MG 1.8 1.6  PHOS 2.9 2.2*  PROT -- 6.0  ALBUMIN -- 2.4*  AST -- 20  ALT -- 8  ALKPHOS -- 39  BILITOT -- 0.3  BILIDIR -- --  IBILI -- --  PREALBUMIN -- 3.5*  TRIG -- 84  CHOLHDL -- --  CHOL -- 73   Estimated Creatinine Clearance: 77 ml/min (by C-G formula based on Cr of 0.59).   CBGs: 116-136  Medical History: Past Medical History  Diagnosis Date  . COPD (chronic obstructive pulmonary disease)   . Pneumonia   . Hypertension     Medications:  Scheduled:     . albuterol  2.5 mg Nebulization Q6H WA  . ciprofloxacin  400 mg Intravenous Q12H  . enoxaparin  40 mg Subcutaneous Q24H  . insulin aspart  0-9 Units Subcutaneous Q8H  . magnesium sulfate 1 - 4 g bolus IVPB  2 g Intravenous Once  . metronidazole  500 mg Intravenous Q8H  . potassium chloride  10 mEq Intravenous Q1 Hr x 4  . potassium phosphate IVPB (mmol)  20 mmol Intravenous Once  . DISCONTD: albuterol  2.5 mg Nebulization Q6H   Infusions:     . 0.9 % NaCl with KCl 20 mEq / L 85 mL/hr at 11/30/11 0630  . dextrose 40  mL/hr at 11/29/11 1515  . TPN (CLINIMIX) +/- additives 40 mL/hr at 11/28/11 1843   And  . fat emulsion 250 mL (11/28/11 1844)  . TPN (CLINIMIX) +/- additives 40 mL/hr at 11/30/11 0600   PRN: albuterol, LORazepam, menthol-cetylpyridinium, morphine, ondansetron, phenol, sodium chloride  Insulin Requirements in previous 24 hours: 2 units  Current Nutrition:   Clinimix 5/20 at 98mL/hr.  NPO except ice chips  Nutritional Goals:  Per RD assessment 11/28/11:  Kcal:1800-2050  Protein:80-95g    Assessment:  56 y/o M NPO due to diverticulitis w/abscess and likely SBO.    TPN resumed yesterday evening after patient pulled out IV the night before.  Phos, Mg repleted.  K still low.  Plan:   KCl IV q1h x 4.  Increase Clinimix 5/20 to 60ml/hr tonight.  Fat emulsion 20% at 24mL/hr on MWF only due to ongoing shortage.  Multivitamins and trace elements on MWF only due to ongoing shortage.  Continue thiamine: 100mg  IV daily + 100mg  per 2L TPN.   BMet tomorrow.   Elie Goody, PharmD, BCPS Pager: [phone] 11/30/2011  7:51 AM

## 2011-12-01 ENCOUNTER — Encounter (HOSPITAL_COMMUNITY): Payer: Self-pay | Admitting: Surgery

## 2011-12-01 DIAGNOSIS — K56609 Unspecified intestinal obstruction, unspecified as to partial versus complete obstruction: Secondary | ICD-10-CM

## 2011-12-01 DIAGNOSIS — J449 Chronic obstructive pulmonary disease, unspecified: Secondary | ICD-10-CM | POA: Insufficient documentation

## 2011-12-01 DIAGNOSIS — H9190 Unspecified hearing loss, unspecified ear: Secondary | ICD-10-CM

## 2011-12-01 DIAGNOSIS — I10 Essential (primary) hypertension: Secondary | ICD-10-CM | POA: Insufficient documentation

## 2011-12-01 DIAGNOSIS — K5720 Diverticulitis of large intestine with perforation and abscess without bleeding: Secondary | ICD-10-CM

## 2011-12-01 LAB — URINE CULTURE: Culture: NO GROWTH

## 2011-12-01 LAB — BASIC METABOLIC PANEL
BUN: 5 mg/dL — ABNORMAL LOW (ref 6–23)
GFR calc Af Amer: >90 mL/min (ref >90)
GFR calc non Af Amer: >90 mL/min (ref >90)
Potassium: 3.6 mEq/L (ref 3.5–5.1)
Sodium: 135 mEq/L (ref 135–145)

## 2011-12-01 LAB — GLUCOSE, CAPILLARY
Glucose-Capillary: 108 mg/dL — ABNORMAL HIGH (ref 70–99)
Glucose-Capillary: 91 mg/dL (ref 70–99)

## 2011-12-01 MED ORDER — POTASSIUM CHLORIDE IN NACL 20-0.9 MEQ/L-% IV SOLN
INTRAVENOUS | Status: DC
  Administered 2011-12-02: via INTRAVENOUS
  Administered 2011-12-02 – 2011-12-08 (×2): 20 mL/h via INTRAVENOUS
  Administered 2011-12-10: 22:00:00 via INTRAVENOUS
  Administered 2011-12-11 (×4): 20 mL via INTRAVENOUS
  Filled 2011-12-01 (×8): qty 1000

## 2011-12-01 MED ORDER — MAGIC MOUTHWASH
15.0000 mL | Freq: Four times a day (QID) | ORAL | Status: DC | PRN
  Filled 2011-12-01: qty 15

## 2011-12-01 MED ORDER — FLUCONAZOLE IN SODIUM CHLORIDE 400-0.9 MG/200ML-% IV SOLN
400.0000 mg | Freq: Every day | INTRAVENOUS | Status: DC
  Administered 2011-12-01 – 2011-12-10 (×10): 400 mg via INTRAVENOUS
  Filled 2011-12-01 (×12): qty 200

## 2011-12-01 MED ORDER — BISACODYL 10 MG RE SUPP
10.0000 mg | Freq: Every day | RECTAL | Status: DC
  Administered 2011-12-01 – 2011-12-10 (×7): 10 mg via RECTAL
  Filled 2011-12-01 (×9): qty 1

## 2011-12-01 MED ORDER — LIP MEDEX EX OINT
1.0000 "application " | TOPICAL_OINTMENT | Freq: Two times a day (BID) | CUTANEOUS | Status: DC
  Administered 2011-12-01 – 2011-12-26 (×42): 1 via TOPICAL
  Filled 2011-12-01 (×3): qty 7

## 2011-12-01 MED ORDER — THIAMINE HCL 100 MG/ML IJ SOLN
INTRAVENOUS | Status: AC
  Administered 2011-12-01: 17:00:00 via INTRAVENOUS
  Filled 2011-12-01: qty 2000

## 2011-12-01 NOTE — Progress Notes (Addendum)
Zachary Walls [birth year] [id number]  PCP: No primary provider on file. Outpatient Care Team: Patient has no care team.  Inpatient Treatment Team: Treatment Team: Attending Provider: Md Montez Morita, MD; Technician: Rema Fendt, [place]; Consulting Physician: Md Montez Morita, MD; Registered Nurse: Rometta Emery, RN; Dietitian: Marshall Cork, RD; Registered Nurse: Willow Ora, RN; Registered Nurse: Bethann Goo, RN; Technician: Vella Raring, NT; Technician: Michelene Heady, NT; Technician: Mal Misty, NT  Subjective:  No events Pain less Full/bloated at times.  Scant flatus Wants to eat Walking "a fair amount"  Objective:  Vital signs:  Temp:  [97.7 F (36.5 C)-98.8 F (37.1 C)] 97.7 F (36.5 C) (02/09 0522) Pulse Rate:  [70-74] 74  (02/09 0801) Resp:  [16-18] 16  (02/09 0801) BP: (107-110)/(72-73) 107/72 mmHg (02/09 0522) SpO2:  [95 %-99 %] 96 % (02/09 0801) FiO2 (%):  [21 %] 21 % (02/09 0208) Last BM Date: 11/30/11  Intake/Output   Yesterday:  02/08 0701 - 02/09 0700 In: 4643 [I.V.:2755; IV Piggyback:300; VZD:6387] Out: 3720 [Urine:3000; Emesis/NG output:700; Drains:20] This shift:  Total I/O In: -  Out: 300 [Urine:300]  Bowel function:  Flatus: scant  BM: little  Physical Exam:  General: Pt awake/alert/oriented x4 in no acute distress Eyes: PERRL, normal EOM.  Sclera clear.  No icterus Neuro: CN II-XII intact w/o focal sensory/motor deficits. Lymph: No head/neck/groin lymphadenopathy Psych:  No delerium/psychosis/paranoia HENT: Normocephalic, Mucus membranes moist.  No thrush.  Very HOH Neck: Supple, No tracheal deviation Chest: Clear.  No chest wall pain w good excursion CV:  Pulses intact.  Regular rhythm Abdomen: Soft, Nontender.  Mod distended.  No incarcerated hernias. Ext:  SCDs BLE.  No mjr edema.  No cyanosis Skin: No petechiae / purpurae  Results:   Labs: Results for orders placed during the hospital encounter of  11/24/11 (from the past 48 hour(s))  GLUCOSE, CAPILLARY     Status: Abnormal   Collection Time   11/29/11  3:03 PM      Component Value Range Comment   Glucose-Capillary 127 (*) 70 - 99 (mg/dL)   GLUCOSE, CAPILLARY     Status: Abnormal   Collection Time   11/29/11 10:10 PM      Component Value Range Comment   Glucose-Capillary 136 (*) 70 - 99 (mg/dL)   BASIC METABOLIC PANEL     Status: Abnormal   Collection Time   11/30/11  5:10 AM      Component Value Range Comment   Sodium 131 (*) 135 - 145 (mEq/L)    Potassium 3.1 (*) 3.5 - 5.1 (mEq/L)    Chloride 95 (*) 96 - 112 (mEq/L)    CO2 31  19 - 32 (mEq/L)    Glucose, Bld 121 (*) 70 - 99 (mg/dL)    BUN 4 (*) 6 - 23 (mg/dL)    Creatinine, Ser 5.64  0.50 - 1.35 (mg/dL)    Calcium 7.8 (*) 8.4 - 10.5 (mg/dL)    GFR calc non Af Amer >90  >90 (mL/min)    GFR calc Af Amer >90  >90 (mL/min)   MAGNESIUM     Status: Normal   Collection Time   11/30/11  5:10 AM      Component Value Range Comment   Magnesium 1.8  1.5 - 2.5 (mg/dL)   PHOSPHORUS     Status: Normal   Collection Time   11/30/11  5:10 AM      Component Value Range Comment   Phosphorus 2.9  2.3 - 4.6 (mg/dL)   GLUCOSE, CAPILLARY     Status: Abnormal   Collection Time   11/30/11  6:18 AM      Component Value Range Comment   Glucose-Capillary 116 (*) 70 - 99 (mg/dL)   URINALYSIS, ROUTINE W REFLEX MICROSCOPIC     Status: Normal   Collection Time   11/30/11  8:41 AM      Component Value Range Comment   Color, Urine YELLOW  YELLOW     APPearance CLEAR  CLEAR     Specific Gravity, Urine 1.011  1.005 - 1.030     pH 7.0  5.0 - 8.0     Glucose, UA NEGATIVE  NEGATIVE (mg/dL)    Hgb urine dipstick NEGATIVE  NEGATIVE     Bilirubin Urine NEGATIVE  NEGATIVE     Ketones, ur NEGATIVE  NEGATIVE (mg/dL)    Protein, ur NEGATIVE  NEGATIVE (mg/dL)    Urobilinogen, UA 0.2  0.0 - 1.0 (mg/dL)    Nitrite NEGATIVE  NEGATIVE     Leukocytes, UA NEGATIVE  NEGATIVE  MICROSCOPIC NOT DONE ON URINES WITH NEGATIVE  PROTEIN, BLOOD, LEUKOCYTES, NITRITE, OR GLUCOSE <1000 mg/dL.  URINE CULTURE     Status: Normal   Collection Time   11/30/11  8:41 AM      Component Value Range Comment   Specimen Description URINE, RANDOM      Special Requests NONE      Culture  Setup Time [id number]      Colony Count NO GROWTH      Culture NO GROWTH      Report Status 12/01/2011 FINAL     GLUCOSE, CAPILLARY     Status: Abnormal   Collection Time   11/30/11  3:10 PM      Component Value Range Comment   Glucose-Capillary 118 (*) 70 - 99 (mg/dL)   GLUCOSE, CAPILLARY     Status: Abnormal   Collection Time   11/30/11  9:32 PM      Component Value Range Comment   Glucose-Capillary 112 (*) 70 - 99 (mg/dL)    Comment 1 Documented in Chart      Comment 2 Notify RN     BASIC METABOLIC PANEL     Status: Abnormal   Collection Time   12/01/11  3:55 AM      Component Value Range Comment   Sodium 135  135 - 145 (mEq/L)    Potassium 3.6  3.5 - 5.1 (mEq/L)    Chloride 99  96 - 112 (mEq/L)    CO2 31  19 - 32 (mEq/L)    Glucose, Bld 99  70 - 99 (mg/dL)    BUN 5 (*) 6 - 23 (mg/dL)    Creatinine, Ser 3.66  0.50 - 1.35 (mg/dL)    Calcium 8.2 (*) 8.4 - 10.5 (mg/dL)    GFR calc non Af Amer >90  >90 (mL/min)    GFR calc Af Amer >90  >90 (mL/min)   GLUCOSE, CAPILLARY     Status: Abnormal   Collection Time   12/01/11  6:42 AM      Component Value Range Comment   Glucose-Capillary 108 (*) 70 - 99 (mg/dL)     Imaging / Studies: No results found.  Medications / Allergies: per chart  Antibiotics: Anti-infectives     Start     Dose/Rate Route Frequency Ordered Stop   11/24/11 1800   metroNIDAZOLE (FLAGYL) IVPB 500 mg  500 mg 100 mL/hr over 60 Minutes Intravenous Every 8 hours 11/24/11 1506     11/24/11 1509   ciprofloxacin (CIPRO) IVPB 400 mg        400 mg 200 mL/hr over 60 Minutes Intravenous Every 12 hours 11/24/11 1506     11/24/11 1000   ciprofloxacin (CIPRO) IVPB 400 mg        400 mg 200 mL/hr over 60 Minutes  Intravenous  Once 11/24/11 0950 11/24/11 1106   11/24/11 1000   metroNIDAZOLE (FLAGYL) IVPB 500 mg        500 mg 100 mL/hr over 60 Minutes Intravenous  Once 11/24/11 0950 11/24/11 1212   11/24/11 0815   cefTRIAXone (ROCEPHIN) 1 g in dextrose 5 % 50 mL IVPB        1 g 100 mL/hr over 30 Minutes Intravenous  Once 11/24/11 0800 11/24/11 7564          Assessment  Zachary Walls  56 y.o. male       Problem List:  Principal Problem:  *Diverticulitis of large intestine with perforation/abscess Active Problems:  SBO (small bowel obstruction)   Diverticulitis w absces s/p drain & SBO with NGT  Plan: -Cont IV ABx.  Add fluc with Candida in culture -f/u CT in 2 days -IV TNA -NGT until SBO resolves -VTE prophylaxis- SCDs, etc -mobilize as tolerated to help recovery -BP OK  -glc OK -no ev of EtOH w/d at this time  Tried to explain reasoning of care - very [facility] & interrupting often, making it a challenge.  Will give pamphlets as well & see if family around to help (brother bringing another hearing aid apparently)  Ardeth Sportsman, M.D., F.A.C.S. Gastrointestinal and Minimally Invasive Surgery [facility] [address], [address] [place], [place] [address] [phone] Main / Paging [phone] Voice Mail   12/01/2011

## 2011-12-01 NOTE — Progress Notes (Addendum)
PARENTERAL NUTRITION CONSULT NOTE - Follow-Up  Pharmacy Consult for TNA Indication: Diverticulitis w/abscess, likely SBO/ileus  Allergies  Allergen Reactions  . Codeine Rash    Patient Measurements: Height: 5\' 9"  (175.3 cm) Weight: 115 lb (52.164 kg) IBW/kg (Calculated) : 70.7   Vital Signs: Temp: 97.7 F (36.5 C) (02/09 0522) Temp src: Oral (02/09 0522) BP: 107/72 mmHg (02/09 0522) Pulse Rate: 74  (02/09 0522)  Intake/Output from previous day: 02/08 0701 - 02/09 0700 In: 4643 [I.V.:2755; IV Piggyback:300; TPN:1588] Out: 3720 [Urine:3000; Emesis/NG output:700; Drains:20]    Labs:  [facility] 11/29/11 0550  WBC 7.4  HGB 13.1  HCT 37.7*  PLT 323  APTT --  INR --     Basename 12/01/11 0355 11/30/11 0510 11/29/11 0550  NA 135 131* 129*  K 3.6 3.1* 3.3*  CL 99 95* 95*  CO2 31 31 27   GLUCOSE 99 121* 118*  BUN 5* 4* 6  CREATININE 0.67 0.59 0.64  LABCREA -- -- --  CREAT24HRUR -- -- --  CALCIUM 8.2* 7.8* 8.3*  MG -- 1.8 1.6  PHOS -- 2.9 2.2*  PROT -- -- 6.0  ALBUMIN -- -- 2.4*  AST -- -- 20  ALT -- -- 8  ALKPHOS -- -- 39  BILITOT -- -- 0.3  BILIDIR -- -- --  IBILI -- -- --  PREALBUMIN -- -- 3.5*  TRIG -- -- 84  CHOLHDL -- -- --  CHOL -- -- 73   Estimated Creatinine Clearance: 77 ml/min (by C-G formula based on Cr of 0.67).   CBGs: 108-118  Medical History: Past Medical History  Diagnosis Date  . COPD (chronic obstructive pulmonary disease)   . Pneumonia   . Hypertension     Medications:  Scheduled:     . albuterol  2.5 mg Nebulization Q6H WA  . ciprofloxacin  400 mg Intravenous Q12H  . enoxaparin  40 mg Subcutaneous Q24H  . insulin aspart  0-9 Units Subcutaneous Q8H  . metronidazole  500 mg Intravenous Q8H  . potassium chloride  10 mEq Intravenous Q1 Hr x 4  . thiamine  100 mg Intravenous Daily   Infusions:     . 0.9 % NaCl with KCl 20 mEq / L 85 mL/hr at 11/30/11 0630  . 0.9 % NaCl with KCl 20 mEq / L 75 mL/hr at 11/30/11 2025  .  TPN (CLINIMIX) +/- additives 50 mL/hr at 11/30/11 1831   And  . fat emulsion 250 mL (11/30/11 1832)  . TPN (CLINIMIX) +/- additives 40 mL/hr at 11/30/11 0600   PRN: albuterol, LORazepam, menthol-cetylpyridinium, morphine, ondansetron, phenol, sodium chloride  Insulin Requirements in previous 24 hours: none  Current Nutrition:   Clinimix 5/20 at 63mL/hr.  NPO except ice chips  Nutritional Goals:  Per RD assessment 11/28/11:  Kcal:1800-2050  Protein:80-95g  TNA at goal rate of 48ml/hr plus Lipids on MWF to provide 90g protein and 2064 kcal/day (non-lipid days 1584 kcal, avg 1790 kcal/d over a week)  Assessment:  56 y/o M NPO due to diverticulitis w/abscess and likely SBO.    K and Na now WNL after K run replacement and increase in TNA rate  Plan:   Increase Clinimix E 5/20 to goal 60ml/hr tonight.  Fat emulsion 20% at 82mL/hr on MWF only due to ongoing shortage.  Multivitamins and trace elements on MWF only due to ongoing shortage.  Continue thiamine: Will provide only via TNA since at goal rate now  CMet tomorrow.   Hessie Knows, PharmD,  BCPS 12/01/2011 7:48 AM pager [phone]

## 2011-12-02 LAB — COMPREHENSIVE METABOLIC PANEL
AST: 27 U/L (ref 0–37)
Albumin: 2.3 g/dL — ABNORMAL LOW (ref 3.5–5.2)
Calcium: 8.4 mg/dL (ref 8.4–10.5)
Chloride: 97 mEq/L (ref 96–112)
Creatinine, Ser: 0.66 mg/dL (ref 0.50–1.35)
Sodium: 133 mEq/L — ABNORMAL LOW (ref 135–145)
Total Bilirubin: 0.3 mg/dL (ref 0.3–1.2)

## 2011-12-02 LAB — GLUCOSE, CAPILLARY
Glucose-Capillary: 109 mg/dL — ABNORMAL HIGH (ref 70–99)
Glucose-Capillary: 104 mg/dL — ABNORMAL HIGH (ref 70–99)

## 2011-12-02 MED ORDER — LORAZEPAM 2 MG/ML IJ SOLN: 1.0000 mg | Freq: Three times a day (TID) | INTRAMUSCULAR | Status: DC | PRN

## 2011-12-02 MED ORDER — ALBUTEROL SULFATE (5 MG/ML) 0.5% IN NEBU
2.5000 mg | INHALATION_SOLUTION | Freq: Four times a day (QID) | RESPIRATORY_TRACT | Status: DC | PRN
  Administered 2011-12-02 – 2011-12-26 (×8): 2.5 mg via RESPIRATORY_TRACT
  Filled 2011-12-02 (×12): qty 0.5

## 2011-12-02 MED ORDER — LORAZEPAM 2 MG/ML IJ SOLN
1.0000 mg | Freq: Four times a day (QID) | INTRAMUSCULAR | Status: DC | PRN
  Administered 2011-12-02 – 2011-12-25 (×67): 1 mg via INTRAVENOUS
  Filled 2011-12-02 (×68): qty 1

## 2011-12-02 MED ORDER — THIAMINE HCL 100 MG/ML IJ SOLN
INTRAVENOUS | Status: AC
  Administered 2011-12-02: 18:00:00 via INTRAVENOUS
  Filled 2011-12-02: qty 2000

## 2011-12-02 MED ORDER — LORAZEPAM 2 MG/ML IJ SOLN: 1.0000 mg | Freq: Four times a day (QID) | INTRAMUSCULAR | Status: DC

## 2011-12-02 NOTE — Progress Notes (Signed)
  Subjective: Diverticulitis; pelvic abscess-drain placed 2/4 Better; still wants NG out!  Objective: Vital signs in last 24 hours: Temp:  [97.6 F (36.4 C)-98.9 F (37.2 C)] 98.4 F (36.9 C) (02/10 0450) Pulse Rate:  [72-80] 80  (02/10 0450) Resp:  [16] 16  (02/10 0450) BP: (110-136)/(74-84) 110/82 mmHg (02/10 0450) SpO2:  [95 %-98 %] 96 % (02/10 0932) Last BM Date: 12/01/11  Intake/Output from previous day: 02/09 0701 - 02/10 0700 In: 260 [NG/GT:240] Out: 4226 [Urine:2950; Emesis/NG output:1250; Drains:25; Stool:1] Intake/Output this shift:    PE:  VSS; afeb Up in room Passing gas; +BM Drain intact; output 25 cc 2/9: serous/brown Site clean and dry; NT   Lab Results:  No results found for this basename: WBC:2,HGB:2,HCT:2,PLT:2 in the last 72 hours BMET  Basename 12/02/11 0559 12/01/11 0355  NA 133* 135  K 4.3 3.6  CL 97 99  CO2 31 31  GLUCOSE 107* 99  BUN 8 5*  CREATININE 0.66 0.67  CALCIUM 8.4 8.2*   PT/INR No results found for this basename: LABPROT:2,INR:2 in the last 72 hours ABG No results found for this basename: PHART:2,PCO2:2,PO2:2,HCO3:2 in the last 72 hours  Studies/Results: No results found.  Anti-infectives: Anti-infectives     Start     Dose/Rate Route Frequency Ordered Stop   12/01/11 1100   fluconazole (DIFLUCAN) IVPB 400 mg        400 mg 200 mL/hr over 60 Minutes Intravenous Daily 12/01/11 0937     11/24/11 1800   metroNIDAZOLE (FLAGYL) IVPB 500 mg        500 mg 100 mL/hr over 60 Minutes Intravenous Every 8 hours 11/24/11 1506     11/24/11 1509   ciprofloxacin (CIPRO) IVPB 400 mg        400 mg 200 mL/hr over 60 Minutes Intravenous Every 12 hours 11/24/11 1506     11/24/11 1000   ciprofloxacin (CIPRO) IVPB 400 mg        400 mg 200 mL/hr over 60 Minutes Intravenous  Once 11/24/11 0950 11/24/11 1106   11/24/11 1000   metroNIDAZOLE (FLAGYL) IVPB 500 mg        500 mg 100 mL/hr over 60 Minutes Intravenous  Once 11/24/11 0950  11/24/11 1212   11/24/11 0815   cefTRIAXone (ROCEPHIN) 1 g in dextrose 5 % 50 mL IVPB        1 g 100 mL/hr over 30 Minutes Intravenous  Once 11/24/11 0800 11/24/11 1478          Assessment/Plan: S/p diverticular pelvic abscess drain placed 2/4 Output diminishing Clean and dry Pt seems some better; wants NG out Consider reCT soon Plan per CCS   Zachary Walls A 12/02/2011

## 2011-12-02 NOTE — Progress Notes (Signed)
PARENTERAL NUTRITION CONSULT NOTE - Follow-Up  Pharmacy Consult for TNA Indication: Diverticulitis w/abscess, likely SBO/ileus  Allergies  Allergen Reactions  . Codeine Rash    Patient Measurements: Height: 5\' 9"  (175.3 cm) Weight: 115 lb (52.164 kg) IBW/kg (Calculated) : 70.7   Vital Signs: Temp: 98.4 F (36.9 C) (02/10 0450) Temp src: Oral (02/10 0450) BP: 110/82 mmHg (02/10 0450) Pulse Rate: 80  (02/10 0450)  Intake/Output from previous day: 02/09 0701 - 02/10 0700 In: 260 [NG/GT:240] Out: 4226 [Urine:2950; Emesis/NG output:1250; Drains:25; Stool:1]    Labs: No results found for this basename: WBC:3,HGB:3,HCT:3,PLT:3,APTT:3,INR:3 in the last 72 hours   Basename 12/02/11 0559 12/01/11 0355 11/30/11 0510  NA 133* 135 131*  K 4.3 3.6 3.1*  CL 97 99 95*  CO2 31 31 31   GLUCOSE 107* 99 121*  BUN 8 5* 4*  CREATININE 0.66 0.67 0.59  LABCREA -- -- --  CREAT24HRUR -- -- --  CALCIUM 8.4 8.2* 7.8*  MG -- -- 1.8  PHOS -- -- 2.9  PROT 6.2 -- --  ALBUMIN 2.3* -- --  AST 27 -- --  ALT 9 -- --  ALKPHOS 44 -- --  BILITOT 0.3 -- --  BILIDIR -- -- --  IBILI -- -- --  PREALBUMIN -- -- --  TRIG -- -- --  CHOLHDL -- -- --  CHOL -- -- --   Estimated Creatinine Clearance: 77 ml/min (by C-G formula based on Cr of 0.66).   CBGs: 91-112  Medical History: Past Medical History  Diagnosis Date  . COPD (chronic obstructive pulmonary disease)   . Pneumonia   . Hypertension   . Diverticulitis of large intestine with perforation/abscess 12/01/2011  . HOH (hard of hearing) 12/01/2011    Medications:  Scheduled:     . bisacodyl  10 mg Rectal Daily  . ciprofloxacin  400 mg Intravenous Q12H  . enoxaparin  40 mg Subcutaneous Q24H  . fluconazole (DIFLUCAN) IV  400 mg Intravenous Daily  . insulin aspart  0-9 Units Subcutaneous Q8H  . lip balm  1 application Topical BID  . LORazepam  1 mg Intravenous Q6H  . metronidazole  500 mg Intravenous Q8H  . DISCONTD: albuterol  2.5 mg  Nebulization Q6H WA   Infusions:     . 0.9 % NaCl with KCl 20 mEq / L 50 mL/hr at 12/02/11 0016  . TPN (CLINIMIX) +/- additives 50 mL/hr at 11/30/11 1831   And  . fat emulsion 250 mL (11/30/11 1832)  . TPN (CLINIMIX) +/- additives 75 mL/hr at 12/01/11 1724   PRN: albuterol, magic mouthwash, menthol-cetylpyridinium, morphine, ondansetron, phenol, sodium chloride, DISCONTD: albuterol, DISCONTD: LORazepam, DISCONTD: LORazepam  Insulin Requirements in previous 24 hours: none  Current Nutrition:   Clinimix 5/20 at 64mL/hr.  NPO except ice chips  Nutritional Goals:  Per RD assessment 11/28/11:  Kcal:1800-2050  Protein:80-95g  TNA at goal rate of 39ml/hr plus Lipids on MWF to provide 90g protein and 2064 kcal/day (non-lipid days 1584 kcal, avg 1790 kcal/d over a week)  Assessment:  56 y/o M NPO due to diverticulitis w/abscess and likely SBO.    Lytes stable except Na slightly low - management per Md  Plan:   Continue Clinimix E 5/20 at goal 67ml/hr tonight.  Fat emulsion 20% at 59mL/hr on MWF only due to ongoing shortage.  Multivitamins and trace elements on MWF only due to ongoing shortage.  Continue thiamine: Will provide only via TNA since at goal rate now  CMet tomorrow.  Hessie Knows, PharmD, BCPS 12/02/2011 11:32 AM pager [phone]

## 2011-12-02 NOTE — Progress Notes (Signed)
Pt passed bloody appearing bm. Bright red blood visible in toliet. Dr Gerrit Friends notified. Orders noted

## 2011-12-02 NOTE — Progress Notes (Signed)
Zachary Walls 1956-05-22 [id number]  PCP: Viann Shove, MD, MD Outpatient Care Team: Patient Care Team: Whitman Hero Siy-Hian as PCP - General (Internal Medicine)  Inpatient Treatment Team: Treatment Team: Attending Provider: Md Montez Morita, MD; Technician: Rema Fendt, [place]; Consulting Physician: Md Montez Morita, MD; Registered Nurse: Rometta Emery, RN; Dietitian: Marshall Cork, RD; Registered Nurse: Willow Ora, RN; Registered Nurse: Bethann Goo, RN; Technician: Vella Raring, NT; Technician: Michelene Heady, NT; Technician: Mal Misty, NT; Registered Nurse: Sydell Axon, RN  Subjective:  No events Pain minimal More flatus.  ?Small BMs Wants to eat Walking "some" Problems w hearing aid still...  Objective:  Vital signs:  Temp:  [97.6 F (36.4 C)-98.9 F (37.2 C)] 98.4 F (36.9 C) (02/10 0450) Pulse Rate:  [72-80] 80  (02/10 0450) Resp:  [16] 16  (02/10 0450) BP: (110-136)/(74-84) 110/82 mmHg (02/10 0450) SpO2:  [95 %-98 %] 95 % (02/10 0450) Last BM Date: 12/01/11  Intake/Output   Yesterday:  02/09 0701 - 02/10 0700 In: 260 [NG/GT:240] Out: 4226 [Urine:2950; Emesis/NG output:1250; Drains:25; Stool:1] This shift:     Bowel function:  Flatus: More  BM: "a few"  Physical Exam:  General: Pt awake/alert/oriented x4 in no acute distress Eyes: PERRL, normal EOM.  Sclera clear.  No icterus Neuro: CN II-XII intact w/o focal sensory/motor deficits. Lymph: No head/neck/groin lymphadenopathy Psych:  No delerium/psychosis/paranoia HENT: Normocephalic, Mucus membranes moist.  No thrush.  Very HOH - I have to yell for him to hear me... Neck: Supple, No tracheal deviation Chest: Clear.  No chest wall pain w good excursion CV:  Pulses intact.  Regular rhythm Abdomen: Nontender.  Moderatelt distended but softer.  No incarcerated hernias. Ext:  SCDs BLE.  No mjr edema.  No cyanosis Skin: No petechiae /  purpurae  Results:   Labs: Results for orders placed during the hospital encounter of 11/24/11 (from the past 48 hour(s))  GLUCOSE, CAPILLARY     Status: Abnormal   Collection Time   11/30/11  3:10 PM      Component Value Range Comment   Glucose-Capillary 118 (*) 70 - 99 (mg/dL)   GLUCOSE, CAPILLARY     Status: Abnormal   Collection Time   11/30/11  9:32 PM      Component Value Range Comment   Glucose-Capillary 112 (*) 70 - 99 (mg/dL)    Comment 1 Documented in Chart      Comment 2 Notify RN     BASIC METABOLIC PANEL     Status: Abnormal   Collection Time   12/01/11  3:55 AM      Component Value Range Comment   Sodium 135  135 - 145 (mEq/L)    Potassium 3.6  3.5 - 5.1 (mEq/L)    Chloride 99  96 - 112 (mEq/L)    CO2 31  19 - 32 (mEq/L)    Glucose, Bld 99  70 - 99 (mg/dL)    BUN 5 (*) 6 - 23 (mg/dL)    Creatinine, Ser 2.99  0.50 - 1.35 (mg/dL)    Calcium 8.2 (*) 8.4 - 10.5 (mg/dL)    GFR calc non Af Amer >90  >90 (mL/min)    GFR calc Af Amer >90  >90 (mL/min)   GLUCOSE, CAPILLARY     Status: Abnormal   Collection Time   12/01/11  6:42 AM      Component Value Range Comment   Glucose-Capillary 108 (*) 70 - 99 (mg/dL)  GLUCOSE, CAPILLARY     Status: Normal   Collection Time   12/01/11  2:21 PM      Component Value Range Comment   Glucose-Capillary 91  70 - 99 (mg/dL)   GLUCOSE, CAPILLARY     Status: Abnormal   Collection Time   12/01/11  9:50 PM      Component Value Range Comment   Glucose-Capillary 109 (*) 70 - 99 (mg/dL)    Comment 1 Notify RN     COMPREHENSIVE METABOLIC PANEL     Status: Abnormal   Collection Time   12/02/11  5:59 AM      Component Value Range Comment   Sodium 133 (*) 135 - 145 (mEq/L)    Potassium 4.3  3.5 - 5.1 (mEq/L)    Chloride 97  96 - 112 (mEq/L)    CO2 31  19 - 32 (mEq/L)    Glucose, Bld 107 (*) 70 - 99 (mg/dL)    BUN 8  6 - 23 (mg/dL)    Creatinine, Ser 1.61  0.50 - 1.35 (mg/dL)    Calcium 8.4  8.4 - 10.5 (mg/dL)    Total Protein 6.2  6.0 - 8.3  (g/dL)    Albumin 2.3 (*) 3.5 - 5.2 (g/dL)    AST 27  0 - 37 (U/L)    ALT 9  0 - 53 (U/L)    Alkaline Phosphatase 44  39 - 117 (U/L)    Total Bilirubin 0.3  0.3 - 1.2 (mg/dL)    GFR calc non Af Amer >90  >90 (mL/min)    GFR calc Af Amer >90  >90 (mL/min)   GLUCOSE, CAPILLARY     Status: Abnormal   Collection Time   12/02/11  6:27 AM      Component Value Range Comment   Glucose-Capillary 109 (*) 70 - 99 (mg/dL)     Imaging / Studies: No results found.  Medications / Allergies: per chart  Antibiotics: Anti-infectives     Start     Dose/Rate Route Frequency Ordered Stop   12/01/11 1100   fluconazole (DIFLUCAN) IVPB 400 mg        400 mg 200 mL/hr over 60 Minutes Intravenous Daily 12/01/11 0937     11/24/11 1800   metroNIDAZOLE (FLAGYL) IVPB 500 mg        500 mg 100 mL/hr over 60 Minutes Intravenous Every 8 hours 11/24/11 1506     11/24/11 1509   ciprofloxacin (CIPRO) IVPB 400 mg        400 mg 200 mL/hr over 60 Minutes Intravenous Every 12 hours 11/24/11 1506     11/24/11 1000   ciprofloxacin (CIPRO) IVPB 400 mg        400 mg 200 mL/hr over 60 Minutes Intravenous  Once 11/24/11 0950 11/24/11 1106   11/24/11 1000   metroNIDAZOLE (FLAGYL) IVPB 500 mg        500 mg 100 mL/hr over 60 Minutes Intravenous  Once 11/24/11 0950 11/24/11 1212   11/24/11 0815   cefTRIAXone (ROCEPHIN) 1 g in dextrose 5 % 50 mL IVPB        1 g 100 mL/hr over 30 Minutes Intravenous  Once 11/24/11 0800 11/24/11 0960          Assessment  Zachary Walls  56 y.o. male       Problem List:  Principal Problem:  *Diverticulitis of large intestine with perforation/abscess Active Problems:  SBO (small bowel obstruction)  HOH (hard of hearing)  Diverticulitis w abscess s/p drain & SBO with NGT, slowly improving  Plan: -Cont IV ABx.  Add fluc with Candida in culture -f/u CT tomorrow -NGT clamping trial.  Keep until SBO resolves -VTE prophylaxis- SCDs, etc -mobilize as tolerated to help  recovery -BP OK  -glc OK -no ev of EtOH w/d at this time  Pt expressed understanding & appreciation.  Ardeth Sportsman, M.D., F.A.C.S. Gastrointestinal and Minimally Invasive Surgery [facility] [address], [address] [place], [place] [address] [phone] Main / Paging [phone] Voice Mail   12/02/2011

## 2011-12-03 ENCOUNTER — Inpatient Hospital Stay (HOSPITAL_COMMUNITY): Payer: Medicaid Other

## 2011-12-03 LAB — DIFFERENTIAL
Basophils Absolute: 0 10*3/uL (ref 0.0–0.1)
Basophils Relative: 0 % (ref 0–1)
Eosinophils Absolute: 0.1 10*3/uL (ref 0.0–0.7)
Monocytes Absolute: 1.4 10*3/uL — ABNORMAL HIGH (ref 0.1–1.0)
Neutro Abs: 8.8 10*3/uL — ABNORMAL HIGH (ref 1.7–7.7)

## 2011-12-03 LAB — COMPREHENSIVE METABOLIC PANEL
AST: 26 U/L (ref 0–37)
Albumin: 2.5 g/dL — ABNORMAL LOW (ref 3.5–5.2)
Alkaline Phosphatase: 44 U/L (ref 39–117)
BUN: 11 mg/dL (ref 6–23)
CO2: 31 mEq/L (ref 19–32)
Chloride: 93 mEq/L — ABNORMAL LOW (ref 96–112)
Creatinine, Ser: 0.68 mg/dL (ref 0.50–1.35)
GFR calc non Af Amer: >90 mL/min (ref >90)
Potassium: 4.3 mEq/L (ref 3.5–5.1)
Total Bilirubin: 0.4 mg/dL (ref 0.3–1.2)

## 2011-12-03 LAB — CBC
HCT: 38.1 % — ABNORMAL LOW (ref 39.0–52.0)
Hemoglobin: 12.9 g/dL — ABNORMAL LOW (ref 13.0–17.0)
MCH: 29.9 pg (ref 26.0–34.0)
MCHC: 33.9 g/dL (ref 30.0–36.0)
RDW: 14.6 % (ref 11.5–15.5)

## 2011-12-03 LAB — PROTIME-INR: INR: 1.31 (ref 0.00–1.49)

## 2011-12-03 LAB — GLUCOSE, CAPILLARY
Glucose-Capillary: 115 mg/dL — ABNORMAL HIGH (ref 70–99)
Glucose-Capillary: 119 mg/dL — ABNORMAL HIGH (ref 70–99)

## 2011-12-03 LAB — MAGNESIUM: Magnesium: 1.9 mg/dL (ref 1.5–2.5)

## 2011-12-03 LAB — PHOSPHORUS: Phosphorus: 4.4 mg/dL (ref 2.3–4.6)

## 2011-12-03 MED ORDER — TRACE MINERALS CR-CU-MN-SE-ZN 10-1000-500-60 MCG/ML IV SOLN
INTRAVENOUS | Status: AC
  Administered 2011-12-03: 17:00:00 via INTRAVENOUS
  Filled 2011-12-03: qty 2000

## 2011-12-03 MED ORDER — IOHEXOL 300 MG/ML  SOLN
100.0000 mL | Freq: Once | INTRAMUSCULAR | Status: AC | PRN
  Administered 2011-12-03: 100 mL via INTRAVENOUS

## 2011-12-03 MED ORDER — FAT EMULSION 20 % IV EMUL
250.0000 mL | INTRAVENOUS | Status: AC
  Administered 2011-12-03: 250 mL via INTRAVENOUS
  Filled 2011-12-03: qty 250

## 2011-12-03 NOTE — Plan of Care (Signed)
Problem: Inadequate Intake (NI-2.1) Goal: Food and/or nutrient delivery Individualized approach for food/nutrient provision.  Outcome: Not Progressing Pt remains NPO     

## 2011-12-03 NOTE — Progress Notes (Signed)
Nutrition Follow-up  Diet Order: NPO  TPN: Clinimix E 5/20 @ 75 ml/hr.  Lipids (20% IVFE @ 10 ml/hr), multivitamins, and trace elements are provided 3 times weekly (MWF) due to national backorder.  Provides 1790 kcal and 90 grams protein daily (based on weekly average).  Meets 99% minimum estimated kcal and 100% minimum estimated protein needs.  Additional IVF with NS @ 10-4ml/hr.  - Met with pt who denied any nausea, only c/o being hungry.   CT of abdomen/pelvis on 2/11 showed: 1. Placement of a percutaneous drain within an anterior pelvic fluid and gas collection. Slight increase size/mass effect of this abscess.  2. Development of a cul-de-sac peripheral enhancing fluid  collection, also highly suspicious for abscess. A possible smaller mesenteric abscess is also identified.  3. Findings again suspicious for partial small bowel obstruction. Most likely secondary to the infectious/inflammatory process in the pelvis.  4. Persistent colonic wall thickening. The etiology of the  abscesses or is indeterminate but favored to be related to  diverticulitis.  5. Right-sided fat and left sided small bowel containing inguinal hernias.  6. Development of trace left pleural fluid.  7. Small hiatal hernia. Dilated esophagus suggests dysmotility or gastroesophageal reflux.   Meds: Scheduled Meds:   . bisacodyl  10 mg Rectal Daily  . ciprofloxacin  400 mg Intravenous Q12H  . fluconazole (DIFLUCAN) IV  400 mg Intravenous Daily  . insulin aspart  0-9 Units Subcutaneous Q8H  . lip balm  1 application Topical BID  . metronidazole  500 mg Intravenous Q8H   Continuous Infusions:   . 0.9 % NaCl with KCl 20 mEq / L 20 mL/hr (12/02/11 1549)  . TPN (CLINIMIX) +/- additives     And  . fat emulsion    . TPN (CLINIMIX) +/- additives 75 mL/hr at 12/01/11 1724  . TPN (CLINIMIX) +/- additives 75 mL/hr at 12/02/11 1754   PRN Meds:.albuterol, iohexol, LORazepam, magic mouthwash, menthol-cetylpyridinium,  morphine, ondansetron, phenol, sodium chloride  Labs:  CMP     Component Value Date/Time   NA 128* 12/03/2011 0552   K 4.3 12/03/2011 0552   CL 93* 12/03/2011 0552   CO2 31 12/03/2011 0552   GLUCOSE 109* 12/03/2011 0552   BUN 11 12/03/2011 0552   CREATININE 0.68 12/03/2011 0552   CALCIUM 8.7 12/03/2011 0552   PROT 6.8 12/03/2011 0552   ALBUMIN 2.5* 12/03/2011 0552   AST 26 12/03/2011 0552   ALT 9 12/03/2011 0552   ALKPHOS 44 12/03/2011 0552   BILITOT 0.4 12/03/2011 0552   GFRNONAA >90 12/03/2011 0552   GFRAA >90 12/03/2011 0552   CBG (last 3)   Basename 12/03/11 1352 12/03/11 0540 12/02/11 2128  GLUCAP 119* 115* 104*   - Noted PALB improved from 3.5 on 2/6 to 11.2 on 2/10 - Albumin stable - CBGs <150 - Noted low sodium, however noted no intervention planned at this time per pharmacy - Thiamine continued to be supplied in TNA   Intake/Output Summary (Last 24 hours) at 12/03/11 1630 Last data filed at 12/03/11 1452  Gross per 24 hour  Intake 5188.5 ml  Output   5525 ml  Net -336.5 ml  NGT output = total so far today, green bilious drainage Pelvic drain = 10ml output so far today Last BM - 12/03/11  Weight Status: No new weights  Nutrition Dx: Inadequate oral intake - ongoing  Goal: TNA to meet >90% of estimated nutritional needs - met.  Intervention: TNA per pharmacy. Diet advancement  per MD. No educational needs at this time.   Monitor: Weight, labs, TNA, diet advancement   Marshall Cork Pager #: [phone]

## 2011-12-03 NOTE — Progress Notes (Signed)
Subjective: + stools, 300 ml/NG yest., afebrile, WBC is up 12K, last WBC 2/6=7400, last CT 2/4, with drain placement.  Ng clamped, and he's distended, just like last week. No nausea, His major complaint is up peeing allot.  Objective: Vital signs in last 24 hours: Temp:  [97.4 F (36.3 C)-99 F (37.2 C)] 97.4 F (36.3 C) (02/11 0530) Pulse Rate:  [76-80] 78  (02/11 0530) Resp:  [16-18] 18  (02/11 0530) BP: (121-129)/(78-81) 121/78 mmHg (02/11 0530) SpO2:  [95 %-98 %] 95 % (02/11 0530) Last BM Date: 12/03/11  Intake/Output from previous day: 02/10 0701 - 02/11 0700 In: 30  Out: 5240 [Urine:4875; Emesis/NG output:300; Drains:65] Intake/Output this shift:   ON Cipro & Flagyl with Diflucan just added 12/01/11. General appearance: alert, cooperative, no distress and HOH Resp: clear to auscultation bilaterally GI: ABD: distended, not complaining of pain even with palpation.  +BS/BM Green bilious drainage from NG when place back on suction. Male genitalia: normal, Scrotum much impoved.l  Lab Results:   [facility] 12/03/11 0552  WBC 12.0*  HGB 12.9*  HCT 38.1*  PLT 563*    BMET  Basename 12/03/11 0552 12/02/11 0559  NA 128* 133*  K 4.3 4.3  CL 93* 97  CO2 31 31  GLUCOSE 109* 107*  BUN 11 8  CREATININE 0.68 0.66  CALCIUM 8.7 8.4     Lab 12/03/11 0552 12/02/11 0559 11/29/11 0550 11/27/11 0415  AST 26 27 20 21   ALT 9 9 8 8   ALKPHOS 44 44 39 61  BILITOT 0.4 0.3 0.3 0.2*  PROT 6.8 6.2 6.0 5.8*  ALBUMIN 2.5* 2.3* 2.4* 2.3*    PT/INR  Basename 12/03/11 0552  LABPROT 16.5*  INR 1.31     Studies/Results: No results found.  Anti-infectives: Anti-infectives     Start     Dose/Rate Route Frequency Ordered Stop   12/01/11 1100   fluconazole (DIFLUCAN) IVPB 400 mg        400 mg 200 mL/hr over 60 Minutes Intravenous Daily 12/01/11 0937     11/24/11 1800   metroNIDAZOLE (FLAGYL) IVPB 500 mg        500 mg 100 mL/hr over 60 Minutes Intravenous Every 8 hours  11/24/11 1506     11/24/11 1509   ciprofloxacin (CIPRO) IVPB 400 mg        400 mg 200 mL/hr over 60 Minutes Intravenous Every 12 hours 11/24/11 1506     11/24/11 1000   ciprofloxacin (CIPRO) IVPB 400 mg        400 mg 200 mL/hr over 60 Minutes Intravenous  Once 11/24/11 0950 11/24/11 1106   11/24/11 1000   metroNIDAZOLE (FLAGYL) IVPB 500 mg        500 mg 100 mL/hr over 60 Minutes Intravenous  Once 11/24/11 0950 11/24/11 1212   11/24/11 0815   cefTRIAXone (ROCEPHIN) 1 g in dextrose 5 % 50 mL IVPB        1 g 100 mL/hr over 30 Minutes Intravenous  Once 11/24/11 0800 11/24/11 4540         Current Facility-Administered Medications  Medication Dose Route Frequency Provider Last Rate Last Dose  . 0.9 % NaCl with KCl 20 mEq/ L  infusion   Intravenous Continuous Ardeth Sportsman, MD 20 mL/hr at 12/02/11 1549 20 mL/hr at 12/02/11 1549  . albuterol (PROVENTIL) (5 MG/ML) 0.5% nebulizer solution 2.5 mg  2.5 mg Nebulization Q6H PRN Ardeth Sportsman, MD   2.5 mg at 12/02/11 2219  .  bisacodyl (DULCOLAX) suppository 10 mg  10 mg Rectal Daily Ardeth Sportsman, MD   10 mg at 12/02/11 1036  . ciprofloxacin (CIPRO) IVPB 400 mg  400 mg Intravenous Q12H Rulon Abide, DO   400 mg at 12/03/11 0319  . tpn solution (CLINIMIX E 5/20) 2,000 mL with multivitamins adult 10 mL, trace elements Cr-Cu-Mn-Se-Zn 1 mL, thiamine 100 mg infusion   Intravenous Continuous TPN Randall K Absher, PHARMD       And  . fat emulsion 20 % infusion 250 mL  250 mL Intravenous Continuous TPN Randall K Absher, PHARMD      . fluconazole (DIFLUCAN) IVPB 400 mg  400 mg Intravenous Daily Ardeth Sportsman, MD   400 mg at 12/02/11 1037  . insulin aspart (novoLOG) injection 0-9 Units  0-9 Units Subcutaneous Q8H Randall K Absher, PHARMD   1 Units at 11/29/11 2217  . lip balm (CARMEX) ointment 1 application  1 application Topical BID Ardeth Sportsman, MD   1 application at 12/03/11 [phone]  . LORazepam (ATIVAN) injection 1 mg  1 mg Intravenous Q6H  PRN Velora Heckler, MD   1 mg at 12/03/11 0601  . magic mouthwash  15 mL Oral QID PRN Ardeth Sportsman, MD      . menthol-cetylpyridinium (CEPACOL) lozenge 3 mg  1 lozenge Oral PRN Marianna Fuss, PA   3 mg at 11/29/11 1137  . metroNIDAZOLE (FLAGYL) IVPB 500 mg  500 mg Intravenous Q8H Rulon Abide, DO   500 mg at 12/03/11 0156  . morphine 2 MG/ML injection 2-4 mg  2-4 mg Intravenous Q2H PRN Marianna Fuss, PA   4 mg at 12/03/11 8119  . ondansetron (ZOFRAN) injection 4 mg  4 mg Intravenous Q6H PRN Rulon Abide, DO   4 mg at 12/03/11 1478  . phenol (CHLORASEPTIC) mouth spray 1 spray  1 spray Mouth/Throat PRN Wilmon Arms. Tsuei, MD   1 spray at 11/26/11 1735  . sodium chloride 0.9 % injection 10-40 mL  10-40 mL Intracatheter PRN Marianna Fuss, PA      . tpn solution (CLINIMIX E 5/20) 2,000 mL with thiamine 100 mg infusion   Intravenous Continuous TPN Wilmon Arms. Tsuei, MD 75 mL/hr at 12/01/11 1724    . tpn solution (CLINIMIX E 5/20) 2,000 mL with thiamine 100 mg infusion   Intravenous Continuous TPN Ardeth Sportsman, MD 75 mL/hr at 12/02/11 1754    . DISCONTD: albuterol (PROVENTIL) (5 MG/ML) 0.5% nebulizer solution 2.5 mg  2.5 mg Nebulization Q2H PRN Rulon Abide, DO   2.5 mg at 11/29/11 1948  . DISCONTD: albuterol (PROVENTIL) (5 MG/ML) 0.5% nebulizer solution 2.5 mg  2.5 mg Nebulization Q6H [place] Rulon Abide, DO   2.5 mg at 12/02/11 0931  . DISCONTD: enoxaparin (LOVENOX) injection 40 mg  40 mg Subcutaneous Q24H Rulon Abide, DO   40 mg at 12/01/11 2208  . DISCONTD: LORazepam (ATIVAN) injection 1 mg  1 mg Intravenous Q6H PRN Emelia Loron, MD   1 mg at 12/02/11 0437  . DISCONTD: LORazepam (ATIVAN) injection 1 mg  1 mg Intravenous Q8H PRN Ardeth Sportsman, MD      . DISCONTD: LORazepam (ATIVAN) injection 1 mg  1 mg Intravenous Q6H Thomas A. Cornett, MD        Assessment/Plan Diverticulitis with abscess and likely associated small bowel obstruction  .  COPD (chronic  obstructive pulmonary disease)    .  Pneumonia    .  Hypertension    Hernia repair  HOH  Percutaneous Drain 11/26/11/IR Diverticulitis w abscess s/p drain & SBO with NGT, slowly improving   Plan:  I put him back on NG suction, he has a repeat CT scan ordered for today.   LOS: 9 days    Miela Desjardin 12/03/2011

## 2011-12-03 NOTE — H&P (Signed)
  Patient underwent CT today for abscess follow up - new collections noted and needs repositioning of existing drain.  Talked with Zola Button, PA with [facility] who along with his surgeon is to talk with patient later today in regards to additional procedures.  He was notified of our findings and suggestions for further drainage.  Orders placed as if to move forward unless deemed otherwise after surgeon sees later today.

## 2011-12-03 NOTE — Progress Notes (Signed)
PARENTERAL NUTRITION CONSULT NOTE - Follow-Up  Pharmacy Consult for TNA Indication: Diverticulitis w/abscess, likely SBO/ileus  Allergies  Allergen Reactions  . Codeine Rash    Patient Measurements: Height: 5\' 9"  (175.3 cm) Weight: 115 lb (52.164 kg) IBW/kg (Calculated) : 70.7   Vital Signs: Temp: 97.4 F (36.3 C) (02/11 0530) Temp src: Oral (02/11 0530) BP: 121/78 mmHg (02/11 0530) Pulse Rate: 78  (02/11 0530)  Intake/Output from previous day: Not accurate  Labs:  [facility] 12/03/11 0552  WBC 12.0*  HGB 12.9*  HCT 38.1*  PLT 563*  APTT --  INR 1.31     Basename 12/03/11 0552 12/02/11 0559 12/01/11 0355  NA 128* 133* 135  K 4.3 4.3 3.6  CL 93* 97 99  CO2 31 31 31   GLUCOSE 109* 107* 99  BUN 11 8 5*  CREATININE 0.68 0.66 0.67  LABCREA -- -- --  CREAT24HRUR -- -- --  CALCIUM 8.7 8.4 8.2*  MG 1.9 -- --  PHOS 4.4 -- --  PROT 6.8 6.2 --  ALBUMIN 2.5* 2.3* --  AST 26 27 --  ALT 9 9 --  ALKPHOS 44 44 --  BILITOT 0.4 0.3 --  BILIDIR -- -- --  IBILI -- -- --  PREALBUMIN -- -- --  TRIG -- -- --  CHOLHDL -- -- --  CHOL -- -- --   Estimated Creatinine Clearance: 77 ml/min (by C-G formula based on Cr of 0.68).   CBGs: 100-115  Medical History: Past Medical History  Diagnosis Date  . COPD (chronic obstructive pulmonary disease)   . Pneumonia   . Hypertension   . Diverticulitis of large intestine with perforation/abscess 12/01/2011  . HOH (hard of hearing) 12/01/2011    Medications:  Scheduled:     . bisacodyl  10 mg Rectal Daily  . ciprofloxacin  400 mg Intravenous Q12H  . fluconazole (DIFLUCAN) IV  400 mg Intravenous Daily  . insulin aspart  0-9 Units Subcutaneous Q8H  . lip balm  1 application Topical BID  . metronidazole  500 mg Intravenous Q8H  . DISCONTD: albuterol  2.5 mg Nebulization Q6H WA  . DISCONTD: enoxaparin  40 mg Subcutaneous Q24H  . DISCONTD: LORazepam  1 mg Intravenous Q6H   Infusions:     . 0.9 % NaCl with KCl 20 mEq / L 20  mL/hr (12/02/11 1549)  . TPN (CLINIMIX) +/- additives 75 mL/hr at 12/01/11 1724  . TPN (CLINIMIX) +/- additives 75 mL/hr at 12/02/11 1754   PRN: albuterol, LORazepam, magic mouthwash, menthol-cetylpyridinium, morphine, ondansetron, phenol, sodium chloride, DISCONTD: albuterol, DISCONTD: LORazepam, DISCONTD: LORazepam  Insulin Requirements in previous 24 hours: none  Current Nutrition:   Clinimix 5/20 at 79mL/hr.  Allowed thin liquids and ice chips  Nutritional Goals:  Per RD assessment 11/28/11:  Kcal:1800-2050  Protein:80-95g  TNA at goal rate of 71ml/hr plus Lipids on MWF to provide 90g protein and 2064 kcal/day (non-lipid days 1584 kcal, avg 1790 kcal/d over a week)  Assessment:  56 y/o M with diverticulitis/abscess and likely SBO.    Tolerating TNA at goal rate.  Lytes stable except Na/Cl slightly low - no intervention at this time  Prealbumin and triglycerides pending   Plan:   Continue Clinimix E 5/20 at goal 6ml/hr tonight.  Fat emulsion 20% at 76mL/hr on MWF only due to ongoing shortage.  Multivitamins and trace elements on MWF only due to ongoing shortage.  Continue thiamine  Follow-up on prealbumin and triglycerides when available   Marijo Conception, Pharmacy  Student   Elie Goody, PharmD, BCPS Pager: [phone] 12/03/2011  8:12 AM

## 2011-12-03 NOTE — Progress Notes (Signed)
Received verbal orders from PA [place] to connect patient back up to [employer], Cavan Bearden N 12-03-11 1801

## 2011-12-03 NOTE — Progress Notes (Signed)
Notified PA Will about the finding of the CT of patients abdomen, PA is aware [facility], Tziporah Knoke N 12-03-2011 13:10pm

## 2011-12-04 ENCOUNTER — Inpatient Hospital Stay (HOSPITAL_COMMUNITY): Payer: Medicaid Other

## 2011-12-04 LAB — GLUCOSE, CAPILLARY
Glucose-Capillary: 103 mg/dL — ABNORMAL HIGH (ref 70–99)
Glucose-Capillary: 107 mg/dL — ABNORMAL HIGH (ref 70–99)

## 2011-12-04 MED ORDER — MIDAZOLAM HCL 5 MG/5ML IJ SOLN
INTRAMUSCULAR | Status: AC | PRN
  Administered 2011-12-04 (×2): 1 mg via INTRAVENOUS

## 2011-12-04 MED ORDER — DEXTROSE 10 % IV SOLN
INTRAVENOUS | Status: DC
  Administered 2011-12-04: 03:00:00 via INTRAVENOUS

## 2011-12-04 MED ORDER — THIAMINE HCL 100 MG/ML IJ SOLN
INTRAVENOUS | Status: AC
  Administered 2011-12-04: 19:00:00 via INTRAVENOUS
  Filled 2011-12-04: qty 2000

## 2011-12-04 MED ORDER — FENTANYL CITRATE 0.05 MG/ML IJ SOLN
INTRAMUSCULAR | Status: AC | PRN
  Administered 2011-12-04 (×2): 50 ug via INTRAVENOUS

## 2011-12-04 NOTE — Progress Notes (Addendum)
PARENTERAL NUTRITION CONSULT NOTE - Follow-Up  Pharmacy Consult for TNA Indication: Diverticulitis w/abscess, likely SBO/ileus  Allergies  Allergen Reactions  . Codeine Rash    Patient Measurements: Height: 5\' 9"  (175.3 cm) Weight: 115 lb (52.164 kg) IBW/kg (Calculated) : 70.7   Vital Signs: Temp: 98.1 F (36.7 C) (02/12 0600) Temp src: Oral (02/12 0600) BP: 125/82 mmHg (02/12 0600) Pulse Rate: 72  (02/12 0600)  Intake/Output from previous day: Not accurate  Labs:  [facility] 12/03/11 0552  WBC 12.0*  HGB 12.9*  HCT 38.1*  PLT 563*  APTT --  INR 1.31     Basename 12/03/11 0552 12/02/11 0559  NA 128* 133*  K 4.3 4.3  CL 93* 97  CO2 31 31  GLUCOSE 109* 107*  BUN 11 8  CREATININE 0.68 0.66  LABCREA -- --  CREAT24HRUR -- --  CALCIUM 8.7 8.4  MG 1.9 --  PHOS 4.4 --  PROT 6.8 6.2  ALBUMIN 2.5* 2.3*  AST 26 27  ALT 9 9  ALKPHOS 44 44  BILITOT 0.4 0.3  BILIDIR -- --  IBILI -- --  PREALBUMIN 11.2* --  TRIG 71 --  CHOLHDL -- --  CHOL 71 --   Estimated Creatinine Clearance: 77 ml/min (by C-G formula based on Cr of 0.68).   CBGs: 103-119  Medical History: Past Medical History  Diagnosis Date  . COPD (chronic obstructive pulmonary disease)   . Pneumonia   . Hypertension   . Diverticulitis of large intestine with perforation/abscess 12/01/2011  . HOH (hard of hearing) 12/01/2011    Medications:  Scheduled:     . bisacodyl  10 mg Rectal Daily  . ciprofloxacin  400 mg Intravenous Q12H  . fluconazole (DIFLUCAN) IV  400 mg Intravenous Daily  . insulin aspart  0-9 Units Subcutaneous Q8H  . lip balm  1 application Topical BID  . metronidazole  500 mg Intravenous Q8H   Infusions:     . 0.9 % NaCl with KCl 20 mEq / L 20 mL/hr (12/02/11 1549)  . dextrose 75 mL/hr at 12/04/11 0252  . TPN (CLINIMIX) +/- additives 75 mL/hr at 12/03/11 1708   And  . fat emulsion 250 mL (12/03/11 1708)  . TPN (CLINIMIX) +/- additives 75 mL/hr at 12/02/11 1754   PRN:  albuterol, iohexol, LORazepam, magic mouthwash, menthol-cetylpyridinium, morphine, ondansetron, phenol, sodium chloride  Insulin Requirements in previous 24 hours: none  Current Nutrition:   Patient currently has D-10-W running at 26mL/hr instead of the TPN / Lipids ordered.  Report from night shift is that the radiologist discontinued yesterday's bag of TPN/Lipids for the CT study with IV contrast.  Thin liquids 30-73mL max PO q1h, no more than 1025mL/day allowed.  Nutritional Goals:  Per RD assessment 11/28/11:  Kcal:1800-2050  Protein:80-95g  TNA at goal rate of 76ml/hr plus Lipids on MWF to provide 90g protein and 2064 kcal/day (non-lipid days 1584 kcal, avg 1790 kcal/d over a week)  Assessment:  56 y/o M with diverticulitis/abscess and likely SBO.    Previously tolerating TNA at goal. Prealbumin improving.  Triglycerides wnl.  TNA interrupted at present, reportedly by radiologist for yesterday's CT with IV contrast.  Plan:   Resume Clinimix E 5/20 at goal rate of 61ml/hr.  Fat emulsion 20% at 18mL/hr on MWF only due to ongoing shortage.  Multivitamins and trace elements on MWF only due to ongoing shortage.  Continue thiamine.  Next routine TNA labs due Thursday 12/06/2011  Elie Goody, PharmD, BCPS Pager: [phone] 12/04/2011  8:08 AM

## 2011-12-04 NOTE — Procedures (Signed)
Successful exchange of anterior abdominal drain.  Placed a new 12 fr drain and removed 25 ml of purulent fecal-like material.  Placement of new pelvic drain from transgluteal approach and removed 55 ml of cloudy yellow fluid.  Sent both samples for abscess culture.

## 2011-12-04 NOTE — Progress Notes (Signed)
Subjective: No real change, stomach not as distended.   Objective: Vital signs in last 24 hours: Temp:  [97.9 F (36.6 C)-98.4 F (36.9 C)] 98.1 F (36.7 C) (02/12 0600) Pulse Rate:  [72-75] 72  (02/12 0600) Resp:  [18] 18  (02/12 0600) BP: (124-131)/(82-83) 125/82 mmHg (02/12 0600) SpO2:  [97 %-98 %] 97 % (02/12 0600) Last BM Date: 12/02/11  Intake/Output from previous day: 02/11 0701 - 02/12 0700 In: 6718.6 [I.V.:1784.1; TPN:4904.5] Out: 4560 [Urine:2700; Emesis/NG output:1850; Drains:10] Intake/Output this shift: Total I/O In: -  Out: 200 [Urine:200]  PE:  Alert, working with hearing aide again.  Abd:  Softer, still mildly distended, green drainage in cannister, but not allot,  Drain serous cloudy drainage.  Lab Results:   [facility] 12/03/11 0552  WBC 12.0*  HGB 12.9*  HCT 38.1*  PLT 563*    BMET  Basename 12/03/11 0552 12/02/11 0559  NA 128* 133*  K 4.3 4.3  CL 93* 97  CO2 31 31  GLUCOSE 109* 107*  BUN 11 8  CREATININE 0.68 0.66  CALCIUM 8.7 8.4   PT/INR  Basename 12/03/11 0552  LABPROT 16.5*  INR 1.31     Studies/Results: Ct Abdomen Pelvis W Contrast  12/03/2011  *RADIOLOGY REPORT*  Clinical Data: Status post pelvic abscess drain.  History of diverticular abscess.  Nausea and vomiting.  CT ABDOMEN AND PELVIS WITH CONTRAST  Technique:  Multidetector CT imaging of the abdomen and pelvis was performed following the standard protocol during bolus administration of intravenous contrast.  Contrast: OMNIPAQUE IOHEXOL 300 MG/ML IV SOLN  Comparison: 11/23/2010.  Findings: Centrilobular emphysema at the lung bases.  Mild dependent atelectasis at the left lung base.  Normal heart size without pericardial effusion.  Trace left pleural fluid is new. Dilated contrast filled lower thoracic esophagus, with a nasogastric tube at the descending duodenum.  Normal liver, spleen, stomach, pancreas, gallbladder, biliary tract, adrenal glands, kidneys. No  retroperitoneal or retrocrural adenopathy.  Persistent sigmoid wall thickening and underdistension. Diverticula in this region.  Relatively decompressed left sided colon.  The right-sided colon is normal in caliber.  The imaged appendix is contrast filled and within normal limits.  Normal terminal ileum.  Persistent proximal to mid small bowel dilatation.  4.1 cm.  More distal small bowel loops are relatively decompressed, suggesting at least partial obstruction at the level of the pelvic fluid collections.  Surgical drain is identified within a anterior left pelvic fluid and gas collection.  Bowel loops in this area are not opacified with enteric contrast, degrading evaluation.  The residual peripheral enhancing gas and fluid collection measures 5.7 x 6.5 cm on image 62.  Slightly more mass effect today than on the prior exam, where it measured 7.4 x 3.3 cm.  A new cul-de-sac peripheral enhancing collection measures 8.9 x 5.3 cm on image 66 of series 2. A suspicion of a small peripherally enhancing high pelvic mesenteric collection.  This measures 2.8 x 1.3 cm on image 50 and is also new.  Right inguinal hernia containing mildly edematous fat.  A left inguinal hernia contains  small bowel.  No pelvic adenopathy.  Normal urinary bladder and prostate. Diffuse mesenteric edema. No acute osseous abnormality.  IMPRESSION:  1.  Placement of a percutaneous drain within an anterior pelvic fluid and gas collection.  Slight increase size/mass effect of this abscess. 2.  Development of a cul-de-sac peripheral enhancing fluid collection, also highly suspicious for abscess.  A possible smaller mesenteric abscess is also identified.  These results will be called to the ordering clinician or representative by the Radiologist Assistant, and communication documented in the PACS Dashboard.  3.  Findings again suspicious for partial small bowel obstruction. Most likely secondary to the infectious/inflammatory process in the pelvis.  4.  Persistent colonic wall thickening.  The etiology of the abscesses or is indeterminate but favored to be related to diverticulitis. 5.  Right-sided fat and left sided small bowel containing inguinal hernias. 6.  Development of trace left pleural fluid. 7.  Small hiatal hernia.  Dilated esophagus suggests dysmotility or gastroesophageal reflux.  Original Report Authenticated By: Consuello Bossier, M.D.    Anti-infectives: Anti-infectives     Start     Dose/Rate Route Frequency Ordered Stop   12/01/11 1100   fluconazole (DIFLUCAN) IVPB 400 mg        400 mg 200 mL/hr over 60 Minutes Intravenous Daily 12/01/11 0937     11/24/11 1800   metroNIDAZOLE (FLAGYL) IVPB 500 mg        500 mg 100 mL/hr over 60 Minutes Intravenous Every 8 hours 11/24/11 1506     11/24/11 1509   ciprofloxacin (CIPRO) IVPB 400 mg        400 mg 200 mL/hr over 60 Minutes Intravenous Every 12 hours 11/24/11 1506     11/24/11 1000   ciprofloxacin (CIPRO) IVPB 400 mg        400 mg 200 mL/hr over 60 Minutes Intravenous  Once 11/24/11 0950 11/24/11 1106   11/24/11 1000   metroNIDAZOLE (FLAGYL) IVPB 500 mg        500 mg 100 mL/hr over 60 Minutes Intravenous  Once 11/24/11 0950 11/24/11 1212   11/24/11 0815   cefTRIAXone (ROCEPHIN) 1 g in dextrose 5 % 50 mL IVPB        1 g 100 mL/hr over 30 Minutes Intravenous  Once 11/24/11 0800 11/24/11 4540         Current Facility-Administered Medications  Medication Dose Route Frequency Provider Last Rate Last Dose  . 0.9 % NaCl with KCl 20 mEq/ L  infusion   Intravenous Continuous Ardeth Sportsman, MD 20 mL/hr at 12/02/11 1549 20 mL/hr at 12/02/11 1549  . albuterol (PROVENTIL) (5 MG/ML) 0.5% nebulizer solution 2.5 mg  2.5 mg Nebulization Q6H PRN Ardeth Sportsman, MD   2.5 mg at 12/02/11 2219  . bisacodyl (DULCOLAX) suppository 10 mg  10 mg Rectal Daily Ardeth Sportsman, MD   10 mg at 12/03/11 9811  . ciprofloxacin (CIPRO) IVPB 400 mg  400 mg Intravenous Q12H Rulon Abide, DO    400 mg at 12/04/11 9147  . dextrose 10 % infusion   Intravenous Continuous Ardeth Sportsman, MD 75 mL/hr at 12/04/11 0252    . tpn solution (CLINIMIX E 5/20) 2,000 mL with multivitamins adult 10 mL, trace elements Cr-Cu-Mn-Se-Zn 1 mL, thiamine 100 mg infusion   Intravenous Continuous TPN Ky Barban Absher, PHARMD 75 mL/hr at 12/03/11 1708     And  . fat emulsion 20 % infusion 250 mL  250 mL Intravenous Continuous TPN Randall K Absher, PHARMD 10 mL/hr at 12/03/11 1708 250 mL at 12/03/11 1708  . fluconazole (DIFLUCAN) IVPB 400 mg  400 mg Intravenous Daily Ardeth Sportsman, MD   400 mg at 12/04/11 0943  . insulin aspart (novoLOG) injection 0-9 Units  0-9 Units Subcutaneous Q8H Randall K Absher, PHARMD   1 Units at 11/29/11 2217  . iohexol (OMNIPAQUE) 300 MG/ML solution 100 mL  100 mL Intravenous Once PRN Medication Radiologist, MD   100 mL at 12/03/11 1133  . lip balm (CARMEX) ointment 1 application  1 application Topical BID Ardeth Sportsman, MD   1 application at 12/04/11 [phone]  . LORazepam (ATIVAN) injection 1 mg  1 mg Intravenous Q6H PRN Velora Heckler, MD   1 mg at 12/04/11 9604  . magic mouthwash  15 mL Oral QID PRN Ardeth Sportsman, MD      . menthol-cetylpyridinium (CEPACOL) lozenge 3 mg  1 lozenge Oral PRN Marianna Fuss, PA   3 mg at 11/29/11 1137  . metroNIDAZOLE (FLAGYL) IVPB 500 mg  500 mg Intravenous Q8H Rulon Abide, DO   500 mg at 12/04/11 1050  . morphine 2 MG/ML injection 2-4 mg  2-4 mg Intravenous Q2H PRN Marianna Fuss, PA   4 mg at 12/04/11 0849  . ondansetron (ZOFRAN) injection 4 mg  4 mg Intravenous Q6H PRN Rulon Abide, DO   4 mg at 12/03/11 5409  . phenol (CHLORASEPTIC) mouth spray 1 spray  1 spray Mouth/Throat PRN Wilmon Arms. Tsuei, MD   1 spray at 11/26/11 1735  . sodium chloride 0.9 % injection 10-40 mL  10-40 mL Intracatheter PRN Marianna Fuss, PA      . tpn solution (CLINIMIX E 5/20) 2,000 mL with thiamine 100 mg infusion   Intravenous Continuous TPN Ardeth Sportsman, MD 75 mL/hr at 12/02/11 1754    . tpn solution (CLINIMIX E 5/20) 2,000 mL with thiamine 100 mg infusion   Intravenous Continuous TPN Randall K Absher, PHARMD        Assessment/Plan Diverticulitis with abscess and likely associated small bowel obstruction  .  COPD (chronic obstructive pulmonary disease)    .  Pneumonia    .  Hypertension    Hernia repair  HOH  Percutaneous Drain 11/26/11/IR  Plan:  CT yesterday shows new changes, and IR plans to put in new drain today, adjust the current drain, place a transgluteal and drain a new peripheral site  LOS: 10 days    Zachary Walls 12/04/2011

## 2011-12-05 LAB — GLUCOSE, CAPILLARY: Glucose-Capillary: 104 mg/dL — ABNORMAL HIGH (ref 70–99)

## 2011-12-05 MED ORDER — FAT EMULSION 20 % IV EMUL
250.0000 mL | INTRAVENOUS | Status: AC
  Administered 2011-12-05: 250 mL via INTRAVENOUS
  Filled 2011-12-05: qty 250

## 2011-12-05 MED ORDER — ACETAMINOPHEN 160 MG/5ML PO SOLN: 650.0000 mg | ORAL | Status: DC | PRN

## 2011-12-05 MED ORDER — TRACE MINERALS CR-CU-MN-SE-ZN 10-1000-500-60 MCG/ML IV SOLN
INTRAVENOUS | Status: AC
  Administered 2011-12-05: 19:00:00 via INTRAVENOUS
  Filled 2011-12-05: qty 2000

## 2011-12-05 NOTE — Progress Notes (Signed)
Patient ID: Zachary Walls, male   DOB: [birth year], 56 y.o.   MRN: [id number]    Subjective: Pt feels better after new drain placed yesterday.  Not passing any flatus, NGT OP remains high, but also taking 60 ml of clears and coffee/hr Ambulates in hallway.   Objective: Vital signs in last 24 hours: Temp:  [97.7 F (36.5 C)-98.6 F (37 C)] 98.6 F (37 C) (02/13 0618) Pulse Rate:  [71-79] 77  (02/13 0618) Resp:  [8-18] 18  (02/13 0618) BP: (102-127)/(56-80) 109/78 mmHg (02/13 0618) SpO2:  [94 %-100 %] 96 % (02/13 0618) Last BM Date: 12/02/11  Intake/Output from previous day: 02/12 0701 - 02/13 0700 In: 2952.9 [P.O.:300; I.V.:496.7; TPN:2116.3] Out: 4180 [Urine:2875; Emesis/NG output:1180; Drains:125] Intake/Output this shift: Total I/O In: -  Out: 200 [Urine:200]  PE: General- no distress Lungs- clear Heart- RRR ABD- quiet, mildly distended and tight, but less tender according to pt.   Lab Results:   [facility] 12/03/11 0552  WBC 12.0*  HGB 12.9*  HCT 38.1*  PLT 563*    BMET  Basename 12/03/11 0552  NA 128*  K 4.3  CL 93*  CO2 31  GLUCOSE 109*  BUN 11  CREATININE 0.68  CALCIUM 8.7   PT/INR  Basename 12/03/11 0552  LABPROT 16.5*  INR 1.31     Studies/Results: Ct Abdomen Pelvis W Contrast  12/03/2011  *RADIOLOGY REPORT*  Clinical Data: Status post pelvic abscess drain.  History of diverticular abscess.  Nausea and vomiting.  CT ABDOMEN AND PELVIS WITH CONTRAST  Technique:  Multidetector CT imaging of the abdomen and pelvis was performed following the standard protocol during bolus administration of intravenous contrast.  Contrast: OMNIPAQUE IOHEXOL 300 MG/ML IV SOLN  Comparison: 11/23/2010.  Findings: Centrilobular emphysema at the lung bases.  Mild dependent atelectasis at the left lung base.  Normal heart size without pericardial effusion.  Trace left pleural fluid is new. Dilated contrast filled lower thoracic esophagus, with a nasogastric tube at the  descending duodenum.  Normal liver, spleen, stomach, pancreas, gallbladder, biliary tract, adrenal glands, kidneys. No retroperitoneal or retrocrural adenopathy.  Persistent sigmoid wall thickening and underdistension. Diverticula in this region.  Relatively decompressed left sided colon.  The right-sided colon is normal in caliber.  The imaged appendix is contrast filled and within normal limits.  Normal terminal ileum.  Persistent proximal to mid small bowel dilatation.  4.1 cm.  More distal small bowel loops are relatively decompressed, suggesting at least partial obstruction at the level of the pelvic fluid collections.  Surgical drain is identified within a anterior left pelvic fluid and gas collection.  Bowel loops in this area are not opacified with enteric contrast, degrading evaluation.  The residual peripheral enhancing gas and fluid collection measures 5.7 x 6.5 cm on image 62.  Slightly more mass effect today than on the prior exam, where it measured 7.4 x 3.3 cm.  A new cul-de-sac peripheral enhancing collection measures 8.9 x 5.3 cm on image 66 of series 2. A suspicion of a small peripherally enhancing high pelvic mesenteric collection.  This measures 2.8 x 1.3 cm on image 50 and is also new.  Right inguinal hernia containing mildly edematous fat.  A left inguinal hernia contains  small bowel.  No pelvic adenopathy.  Normal urinary bladder and prostate. Diffuse mesenteric edema. No acute osseous abnormality.  IMPRESSION:  1.  Placement of a percutaneous drain within an anterior pelvic fluid and gas collection.  Slight increase size/mass effect of  this abscess. 2.  Development of a cul-de-sac peripheral enhancing fluid collection, also highly suspicious for abscess.  A possible smaller mesenteric abscess is also identified.  These results will be called to the ordering clinician or representative by the Radiologist Assistant, and communication documented in the PACS Dashboard.  3.  Findings again  suspicious for partial small bowel obstruction. Most likely secondary to the infectious/inflammatory process in the pelvis. 4.  Persistent colonic wall thickening.  The etiology of the abscesses or is indeterminate but favored to be related to diverticulitis. 5.  Right-sided fat and left sided small bowel containing inguinal hernias. 6.  Development of trace left pleural fluid. 7.  Small hiatal hernia.  Dilated esophagus suggests dysmotility or gastroesophageal reflux.  Original Report Authenticated By: Consuello Bossier, M.D.    Anti-infectives: Anti-infectives     Start     Dose/Rate Route Frequency Ordered Stop   12/01/11 1100   fluconazole (DIFLUCAN) IVPB 400 mg        400 mg 200 mL/hr over 60 Minutes Intravenous Daily 12/01/11 0937     11/24/11 1800   metroNIDAZOLE (FLAGYL) IVPB 500 mg        500 mg 100 mL/hr over 60 Minutes Intravenous Every 8 hours 11/24/11 1506     11/24/11 1509   ciprofloxacin (CIPRO) IVPB 400 mg        400 mg 200 mL/hr over 60 Minutes Intravenous Every 12 hours 11/24/11 1506     11/24/11 1000   ciprofloxacin (CIPRO) IVPB 400 mg        400 mg 200 mL/hr over 60 Minutes Intravenous  Once 11/24/11 0950 11/24/11 1106   11/24/11 1000   metroNIDAZOLE (FLAGYL) IVPB 500 mg        500 mg 100 mL/hr over 60 Minutes Intravenous  Once 11/24/11 0950 11/24/11 1212   11/24/11 0815   cefTRIAXone (ROCEPHIN) 1 g in dextrose 5 % 50 mL IVPB        1 g 100 mL/hr over 30 Minutes Intravenous  Once 11/24/11 0800 11/24/11 1610         Current Facility-Administered Medications  Medication Dose Route Frequency Provider Last Rate Last Dose  . 0.9 % NaCl with KCl 20 mEq/ L  infusion   Intravenous Continuous Ardeth Sportsman, MD 20 mL/hr at 12/02/11 1549 20 mL/hr at 12/02/11 1549  . albuterol (PROVENTIL) (5 MG/ML) 0.5% nebulizer solution 2.5 mg  2.5 mg Nebulization Q6H PRN Ardeth Sportsman, MD   2.5 mg at 12/02/11 2219  . bisacodyl (DULCOLAX) suppository 10 mg  10 mg Rectal Daily Ardeth Sportsman, MD   10 mg at 12/03/11 9604  . ciprofloxacin (CIPRO) IVPB 400 mg  400 mg Intravenous Q12H Rulon Abide, DO   400 mg at 12/05/11 0241  . dextrose 10 % infusion   Intravenous Continuous Ardeth Sportsman, MD 75 mL/hr at 12/04/11 0252    . tpn solution (CLINIMIX E 5/20) 2,000 mL with multivitamins adult 10 mL, trace elements Cr-Cu-Mn-Se-Zn 1 mL, thiamine 100 mg infusion   Intravenous Continuous TPN Ky Barban Absher, PHARMD 75 mL/hr at 12/03/11 1708     And  . fat emulsion 20 % infusion 250 mL  250 mL Intravenous Continuous TPN Randall K Absher, PHARMD 10 mL/hr at 12/03/11 1708 250 mL at 12/03/11 1708  . tpn solution (CLINIMIX E 5/20) 2,000 mL with multivitamins adult 10 mL, trace elements Cr-Cu-Mn-Se-Zn 1 mL, thiamine 100 mg infusion   Intravenous Continuous TPN Brynda Greathouse  K Absher, PHARMD       And  . fat emulsion 20 % infusion 250 mL  250 mL Intravenous Continuous TPN Randall K Absher, PHARMD      . fentaNYL (SUBLIMAZE) injection   Intravenous PRN Abundio Miu, MD   50 mcg at 12/04/11 1344  . fluconazole (DIFLUCAN) IVPB 400 mg  400 mg Intravenous Daily Ardeth Sportsman, MD   400 mg at 12/04/11 0943  . insulin aspart (novoLOG) injection 0-9 Units  0-9 Units Subcutaneous Q8H Randall K Absher, PHARMD   1 Units at 11/29/11 2217  . lip balm (CARMEX) ointment 1 application  1 application Topical BID Ardeth Sportsman, MD   1 application at 12/04/11 2200  . LORazepam (ATIVAN) injection 1 mg  1 mg Intravenous Q6H PRN Velora Heckler, MD   1 mg at 12/05/11 0655  . magic mouthwash  15 mL Oral QID PRN Ardeth Sportsman, MD      . menthol-cetylpyridinium (CEPACOL) lozenge 3 mg  1 lozenge Oral PRN Marianna Fuss, PA   3 mg at 11/29/11 1137  . metroNIDAZOLE (FLAGYL) IVPB 500 mg  500 mg Intravenous Q8H Rulon Abide, DO   500 mg at 12/05/11 0055  . midazolam (VERSED) 5 MG/5ML injection   Intravenous PRN Abundio Miu, MD   1 mg at 12/04/11 1344  . morphine 2 MG/ML injection 2-4 mg  2-4 mg Intravenous  Q2H PRN Marianna Fuss, PA   4 mg at 12/05/11 0847  . ondansetron (ZOFRAN) injection 4 mg  4 mg Intravenous Q6H PRN Rulon Abide, DO   4 mg at 12/03/11 1610  . phenol (CHLORASEPTIC) mouth spray 1 spray  1 spray Mouth/Throat PRN Wilmon Arms. Tsuei, MD   1 spray at 11/26/11 1735  . sodium chloride 0.9 % injection 10-40 mL  10-40 mL Intracatheter PRN Marianna Fuss, PA      . tpn solution (CLINIMIX E 5/20) 2,000 mL with thiamine 100 mg infusion   Intravenous Continuous TPN Ky Barban Absher, PHARMD 75 mL/hr at 12/04/11 1843      Assessment/Plan Diverticulitis with abscess and likely associated small bowel obstruction - Second drain placed by IR yesterday and gross feculant/purulent fluid obtained, but this am looks clearer and more seropurulent Previous Percutaneous Drain 11/26/11/IR- is more serosang drainage now SBO-Continues NGT and not passing flatus as yet. Continue NG tube  FEN- Continue TNA ID- Continues Flagyl, Cipro and Diflucan- cultures sent from new drain yesterday pending VTE risk-SCD's, might be okay for low dose Lovenox or SQ Heparin at this point.  [name]  COPD (chronic obstructive pulmonary disease)    .  Pneumonia    .  Hypertension    Hernia repair  [facility]       Nell Gales,PA-C Pager [phone] General Trauma Pager [phone]

## 2011-12-05 NOTE — Progress Notes (Signed)
PARENTERAL NUTRITION CONSULT NOTE - Follow-Up  Pharmacy Consult for TNA Indication: Diverticulitis w/abscess, likely SBO/ileus  Allergies  Allergen Reactions  . Codeine Rash    Patient Measurements: Height: 5\' 9"  (175.3 cm) Weight: 115 lb (52.164 kg) IBW/kg (Calculated) : 70.7   Vital Signs: Temp: 98 F (36.7 C) (02/13 0210) Temp src: Oral (02/13 0210) BP: 103/69 mmHg (02/13 0210) Pulse Rate: 79  (02/13 0210)  Intake/Output from previous day: 1989/3470  Labs:  [facility] 12/03/11 0552  WBC 12.0*  HGB 12.9*  HCT 38.1*  PLT 563*  APTT --  INR 1.31     Basename 12/03/11 0552  NA 128*  K 4.3  CL 93*  CO2 31  GLUCOSE 109*  BUN 11  CREATININE 0.68  LABCREA --  CREAT24HRUR --  CALCIUM 8.7  MG 1.9  PHOS 4.4  PROT 6.8  ALBUMIN 2.5*  AST 26  ALT 9  ALKPHOS 44  BILITOT 0.4  BILIDIR --  IBILI --  PREALBUMIN 11.2*  TRIG 71  CHOLHDL --  CHOL 71   Estimated Creatinine Clearance: 77 ml/min (by C-G formula based on Cr of 0.68).   CBGs: 83-107  Medications:  Scheduled:     . bisacodyl  10 mg Rectal Daily  . ciprofloxacin  400 mg Intravenous Q12H  . fluconazole (DIFLUCAN) IV  400 mg Intravenous Daily  . insulin aspart  0-9 Units Subcutaneous Q8H  . lip balm  1 application Topical BID  . metronidazole  500 mg Intravenous Q8H   Infusions:     . 0.9 % NaCl with KCl 20 mEq / L 20 mL/hr (12/02/11 1549)  . dextrose 75 mL/hr at 12/04/11 0252  . TPN (CLINIMIX) +/- additives 75 mL/hr at 12/03/11 1708   And  . fat emulsion 250 mL (12/03/11 1708)  . TPN (CLINIMIX) +/- additives 75 mL/hr at 12/04/11 1843   PRN: albuterol, fentaNYL, LORazepam, magic mouthwash, menthol-cetylpyridinium, midazolam, morphine, ondansetron, phenol, sodium chloride  Insulin Requirements in previous 24 hours: none  Current Nutrition:   Clinimix 5/20 at 60mL/hr  NPO except ice chips and minimal amts thin liquids.  NG tube in place.  Nutritional Goals:  Per RD assessment  11/28/11:  Kcal:1800-2050  Protein:80-95g  TNA at goal rate of 2ml/hr plus Lipids on MWF to provide 90g protein and 2064 kcal/day (non-lipid days 1584 kcal, avg 1790 kcal/d over a week)  Assessment:  56 y/o M with diverticulitis/abscess and likely SBO.    Tolerating TNA at goal rate.  Plan:   Continue Clinimix E 5/20 at goal rate of 94ml/hr.  Fat emulsion 20% at 59mL/hr on MWF only due to ongoing shortage.  Multivitamins and trace elements on MWF only due to ongoing shortage.  Continue thiamine.  Next routine TNA labs due Thursday 12/06/2011  Elie Goody, PharmD, BCPS Pager: [phone] 12/05/2011  6:28 AM

## 2011-12-06 LAB — COMPREHENSIVE METABOLIC PANEL
CO2: 31 mEq/L (ref 19–32)
Calcium: 8.9 mg/dL (ref 8.4–10.5)
Creatinine, Ser: 0.77 mg/dL (ref 0.50–1.35)
Glucose, Bld: 104 mg/dL — ABNORMAL HIGH (ref 70–99)
Sodium: 132 mEq/L — ABNORMAL LOW (ref 135–145)
Total Bilirubin: 0.2 mg/dL — ABNORMAL LOW (ref 0.3–1.2)
Total Protein: 7.1 g/dL (ref 6.0–8.3)

## 2011-12-06 MED ORDER — THIAMINE HCL 100 MG/ML IJ SOLN
INTRAMUSCULAR | Status: AC
  Administered 2011-12-06: 18:00:00 via INTRAVENOUS
  Filled 2011-12-06: qty 2000

## 2011-12-06 NOTE — Progress Notes (Signed)
PARENTERAL NUTRITION CONSULT NOTE - Follow-Up  Pharmacy Consult for TNA Indication: Diverticulitis w/abscess, likely SBO/ileus  Allergies  Allergen Reactions  . Codeine Rash    Patient Measurements: Height: 5\' 9"  (175.3 cm) Weight: 115 lb (52.164 kg) IBW/kg (Calculated) : 70.7   Vital Signs: Temp: 98.6 F (37 C) (02/14 0615) Temp src: Oral (02/14 0615) BP: 125/84 mmHg (02/14 0615) Pulse Rate: 68  (02/14 0615)  Intake/Output from previous day: 3272 / 3552 I/O net -280 mL  Labs: No results found for this basename: WBC:3,HGB:3,HCT:3,PLT:3,APTT:3,INR:3 in the last 72 hours   Basename 12/06/11 0500  NA 132*  K 3.9  CL 93*  CO2 31  GLUCOSE 104*  BUN 16  CREATININE 0.77  LABCREA --  CREAT24HRUR --  CALCIUM 8.9  MG 2.1  PHOS 4.2  PROT 7.1  ALBUMIN 2.6*  AST 24  ALT 8  ALKPHOS 48  BILITOT 0.2*  BILIDIR --  IBILI --  PREALBUMIN --  TRIG --  CHOLHDL --  CHOL --   Estimated Creatinine Clearance: 77 ml/min (by C-G formula based on Cr of 0.77).   CBGs: 104-111  Medications:  Scheduled:     . bisacodyl  10 mg Rectal Daily  . ciprofloxacin  400 mg Intravenous Q12H  . fluconazole (DIFLUCAN) IV  400 mg Intravenous Daily  . insulin aspart  0-9 Units Subcutaneous Q8H  . lip balm  1 application Topical BID  . metronidazole  500 mg Intravenous Q8H   Infusions:     . 0.9 % NaCl with KCl 20 mEq / L 20 mL/hr (12/02/11 1549)  . dextrose 75 mL/hr at 12/04/11 0252  . TPN (CLINIMIX) +/- additives 75 mL/hr at 12/05/11 1840   And  . fat emulsion 250 mL (12/05/11 1840)  . TPN (CLINIMIX) +/- additives 75 mL/hr at 12/04/11 1843   PRN: acetaminophen (TYLENOL) oral liquid 160 mg/5 mL, albuterol, LORazepam, magic mouthwash, menthol-cetylpyridinium, morphine, ondansetron, phenol, sodium chloride  Insulin Requirements in previous 24 hours: None  Current Nutrition:   Clinimix 5/20 at 88mL/hr  NPO except ice chips and minimal amts thin liquids.  NG tube in place with  output yesterday.  Nutritional Goals:  Per RD assessment 11/28/11:  Kcal:1800-2050  Protein:80-95g  TNA at goal rate of 29ml/hr plus Lipids on MWF to provide 90g protein and 2064 kcal/day (non-lipid days 1584 kcal, avg 1790 kcal/d over a week)  Assessment:  56 y/o M with diverticulitis/abscess and likely SBO.    NGT output remains high  Tolerating TNA at goal rate  TNA labs: hyponatremic but improved, electrolytes stable, albumin low but improved  Plan:   Continue Clinimix E 5/20 at goal rate of 46ml/hr.  Fat emulsion 20% at 59mL/hr on MWF only due to ongoing shortage.  Multivitamins and trace elements on MWF only due to ongoing shortage.  Continue thiamine.  Continue routine TNA labs as ordered  **  Note:  Pt refusing SCD for mechanical VTE prophylaxis.  Consider ordering pharmacological prophylaxis if patient could tolerate.  Lynann Beaver PharmD, BCPS Pager [phone] 12/06/2011 7:55 AM

## 2011-12-06 NOTE — Progress Notes (Signed)
Subjective: Patient feeling better overall ;throat irritated and wants NG out; hearing aid not working  Objective: Vital signs in last 24 hours: Temp:  [97.5 F (36.4 C)-98.6 F (37 C)] 98.6 F (37 C) (02/14 0615) Pulse Rate:  [68-83] 68  (02/14 0615) Resp:  [17-18] 18  (02/14 0615) BP: (124-133)/(84) 125/84 mmHg (02/14 0615) SpO2:  [94 %-99 %] 94 % (02/14 0615) Last BM Date: 12/06/11  Intake/Output from previous day: 02/13 0701 - 02/14 0700 In: 3272 [P.O.:420; I.V.:833.3; TPN:1923.7] Out: 3552 [Urine:2015; Emesis/NG output:1470; Drains:67] Intake/Output this shift: Total I/O In: 120 [P.O.:120] Out: 140 [Urine:140]  Ant pelvic/ transgluteal drains intact, outputs about  30/40 cc's respectively last 24 hrs, cx's pend  Lab Results:  No results found for this basename: WBC:2,HGB:2,HCT:2,PLT:2 in the last 72 hours BMET  Basename 12/06/11 0500  NA 132*  K 3.9  CL 93*  CO2 31  GLUCOSE 104*  BUN 16  CREATININE 0.77  CALCIUM 8.9   PT/INR No results found for this basename: LABPROT:2,INR:2 in the last 72 hours ABG No results found for this basename: PHART:2,PCO2:2,PO2:2,HCO3:2 in the last 72 hours  Studies/Results: Ct Guided Abscess Drain  12/05/2011  *RADIOLOGY REPORT*  Clinical history:56 year old with abdominal abscesses.  New abscess collection in the pelvis and existing catheter needs repositioning.  PROCEDURE(S): CT GUIDED EXCHANGE OF THE ANTERIOR ABDOMINAL DRAINAGE CATHETER; CT GUIDED PLACEMENT OF A NEW POSTERIOR PELVIC ABSCESS DRAINAGE CATHETER  Physician: Rachelle Hora. Henn, MD  Medications:Versed 2 mg, Fentanyl 100 mcg. A radiology nurse monitored the patient for moderate sedation.  Moderate sedation time:60 minutes  Procedure:The procedure was explained to the patient.  The risks and benefits of the procedure were discussed and the patient's questions were addressed.  Informed consent was obtained from the patient.  The patient was placed supine and images through the  lower abdomen and pelvis were obtained.  The drain in the anterior pelvic region was identified.  The suture was removed and attempted to pull catheter back.  The catheter would not easily come back. As a result, this area was prepped and draped in a sterile fashion. An Amplatz wire was placed through the drain and the drain was slowly pulled back.  CT imaging confirmed wire placement within the large air-fluid collection within the anterior abdomen.  The catheter was completely removed and a new 12-French drain was reconstituted within the collection.  Large amount of gas was aspirated.  25 ml of purulent fecal material was removed.  The catheter was attached to a suction bulb and secured to the skin. The patient was placed in a left lateral decubitus position and the right gluteal region was prepped and draped in a sterile fashion. 18 gauge needle was directed into the pelvic fluid collection for a transgluteal approach using CT guidance.  Cloudy yellow fluid was aspirated.  Stiff Amplatz wire was placed and a 10-French drain was reconstituted in the abscess collection.  Catheter was attached to a suction bulb and secured to the skin.  Samples of both of fluid collections were sent for Gram stain and culture.  Findings:Large air-fluid collection in the anterior abdomen was not be adequately drained by the existing catheter.  The old catheter was pulled back into the collection and a new drainage catheter was placed. Majority the air and fluid was aspirated. 25 ml of purulent fecal material was removed from the anterior collection. Successful placement of a drainage catheter within the pelvic fluid collection. 55 ml of cloudy yellow fluid was removed  from the pelvic collection.  Complications: None  Impression:Successful CT guided exchange and repositioning of the anterior abdominal drainage catheter.  Successful CT guided placement of a new pelvic drain.  Original Report Authenticated By: Richarda Overlie, M.D.   Ct  Guided Abscess Drain  12/05/2011  *RADIOLOGY REPORT*  Clinical history:56 year old with abdominal abscesses.  New abscess collection in the pelvis and existing catheter needs repositioning.  PROCEDURE(S): CT GUIDED EXCHANGE OF THE ANTERIOR ABDOMINAL DRAINAGE CATHETER; CT GUIDED PLACEMENT OF A NEW POSTERIOR PELVIC ABSCESS DRAINAGE CATHETER  Physician: Rachelle Hora. Henn, MD  Medications:Versed 2 mg, Fentanyl 100 mcg. A radiology nurse monitored the patient for moderate sedation.  Moderate sedation time:60 minutes  Procedure:The procedure was explained to the patient.  The risks and benefits of the procedure were discussed and the patient's questions were addressed.  Informed consent was obtained from the patient.  The patient was placed supine and images through the lower abdomen and pelvis were obtained.  The drain in the anterior pelvic region was identified.  The suture was removed and attempted to pull catheter back.  The catheter would not easily come back. As a result, this area was prepped and draped in a sterile fashion. An Amplatz wire was placed through the drain and the drain was slowly pulled back.  CT imaging confirmed wire placement within the large air-fluid collection within the anterior abdomen.  The catheter was completely removed and a new 12-French drain was reconstituted within the collection.  Large amount of gas was aspirated.  25 ml of purulent fecal material was removed.  The catheter was attached to a suction bulb and secured to the skin. The patient was placed in a left lateral decubitus position and the right gluteal region was prepped and draped in a sterile fashion. 18 gauge needle was directed into the pelvic fluid collection for a transgluteal approach using CT guidance.  Cloudy yellow fluid was aspirated.  Stiff Amplatz wire was placed and a 10-French drain was reconstituted in the abscess collection.  Catheter was attached to a suction bulb and secured to the skin.  Samples of both of  fluid collections were sent for Gram stain and culture.  Findings:Large air-fluid collection in the anterior abdomen was not be adequately drained by the existing catheter.  The old catheter was pulled back into the collection and a new drainage catheter was placed. Majority the air and fluid was aspirated. 25 ml of purulent fecal material was removed from the anterior collection. Successful placement of a drainage catheter within the pelvic fluid collection. 55 ml of cloudy yellow fluid was removed from the pelvic collection.  Complications: None  Impression:Successful CT guided exchange and repositioning of the anterior abdominal drainage catheter.  Successful CT guided placement of a new pelvic drain.  Original Report Authenticated By: Richarda Overlie, M.D.   Results for orders placed during the hospital encounter of 11/24/11  CULTURE, ROUTINE-ABSCESS     Status: Normal   Collection Time   11/26/11 12:06 PM      Component Value Range Status Comment   Specimen Description ABSCESS PELVIC   Final    Special Requests NONE   Final    Gram Stain     Final    Value: FEW WBC PRESENT,BOTH PMN AND MONONUCLEAR     NO SQUAMOUS EPITHELIAL CELLS SEEN     FEW GRAM POSITIVE COCCI     IN PAIRS RARE GRAM POSITIVE RODS   Culture FEW CANDIDA ALBICANS   Final  Report Status 11/29/2011 FINAL   Final   URINE CULTURE     Status: Normal   Collection Time   11/30/11  8:41 AM      Component Value Range Status Comment   Specimen Description URINE, RANDOM   Final    Special Requests NONE   Final    Culture  Setup Time [id number]   Final    Colony Count NO GROWTH   Final    Culture NO GROWTH   Final    Report Status 12/01/2011 FINAL   Final   CULTURE, ROUTINE-ABSCESS     Status: Normal (Preliminary result)   Collection Time   12/04/11  2:11 PM      Component Value Range Status Comment   Specimen Description OTHER   Final    Special Requests NONE   Final    Gram Stain     Final    Value: ABUNDANT WBC PRESENT,  PREDOMINANTLY PMN     NO SQUAMOUS EPITHELIAL CELLS SEEN     FEW YEAST   Culture FEW CANDIDA ALBICANS   Final    Report Status PENDING   Incomplete   CULTURE, ROUTINE-ABSCESS     Status: Normal (Preliminary result)   Collection Time   12/04/11  3:27 PM      Component Value Range Status Comment   Specimen Description OTHER   Final    Special Requests NONE   Final    Gram Stain     Final    Value: ABUNDANT WBC PRESENT,BOTH PMN AND MONONUCLEAR     NO SQUAMOUS EPITHELIAL CELLS SEEN     NO ORGANISMS SEEN   Culture NO GROWTH 1 DAY   Final    Report Status PENDING   Incomplete     Anti-infectives: Anti-infectives     Start     Dose/Rate Route Frequency Ordered Stop   12/01/11 1100   fluconazole (DIFLUCAN) IVPB 400 mg        400 mg 200 mL/hr over 60 Minutes Intravenous Daily 12/01/11 0937     11/24/11 1800   metroNIDAZOLE (FLAGYL) IVPB 500 mg        500 mg 100 mL/hr over 60 Minutes Intravenous Every 8 hours 11/24/11 1506     11/24/11 1509   ciprofloxacin (CIPRO) IVPB 400 mg        400 mg 200 mL/hr over 60 Minutes Intravenous Every 12 hours 11/24/11 1506     11/24/11 1000   ciprofloxacin (CIPRO) IVPB 400 mg        400 mg 200 mL/hr over 60 Minutes Intravenous  Once 11/24/11 0950 11/24/11 1106   11/24/11 1000   metroNIDAZOLE (FLAGYL) IVPB 500 mg        500 mg 100 mL/hr over 60 Minutes Intravenous  Once 11/24/11 0950 11/24/11 1212   11/24/11 0815   cefTRIAXone (ROCEPHIN) 1 g in dextrose 5 % 50 mL IVPB        1 g 100 mL/hr over 30 Minutes Intravenous  Once 11/24/11 0800 11/24/11 8119          Assessment/Plan: s/p ant pelvic drain exchange, new transgluteal abscess drain placement 2/12; cont current tx, check final cx's, obtain f/u CT next week with injection of drains   LOS: 12 days    Bernetha Anschutz,D [facility] 12/06/2011

## 2011-12-06 NOTE — Progress Notes (Signed)
Subjective: No real change.  He still doesn't really understand why he can't have something to eat.  He's trying to cope the best he can.  He has discomfort at the drain sites, abd still sl. Distended but better PO recorded, 1470 ml out thru NG   Objective: Vital signs in last 24 hours: Temp:  [97.5 F (36.4 C)-98.6 F (37 C)] 98.6 F (37 C) (02/14 0615) Pulse Rate:  [68-83] 68  (02/14 0615) Resp:  [17-18] 18  (02/14 0615) BP: (124-133)/(84) 125/84 mmHg (02/14 0615) SpO2:  [94 %-99 %] 94 % (02/14 0615) Last BM Date: 12/06/11  Intake/Output from previous day: 02/13 0701 - 02/14 0700 In: 3272 [P.O.:420; I.V.:833.3; TPN:1923.7] Out: 3552 [Urine:2015; Emesis/NG output:1470; Drains:67] Intake/Output this shift: Total I/O In: 120 [P.O.:120] Out: 140 [Urine:140]  PE:  Alert, sleeping when I came in.  Abd:  Less distended, softer, +BS, +flatus, Pt  had 2 stools recorded yesterday.   Drains: mostly clear, some purulence but looks better.  Lab Results:  No results found for this basename: WBC:2,HGB:2,HCT:2,PLT:2 in the last 72 hours  BMET  Basename 12/06/11 0500  NA 132*  K 3.9  CL 93*  CO2 31  GLUCOSE 104*  BUN 16  CREATININE 0.77  CALCIUM 8.9   PT/INR No results found for this basename: LABPROT:2,INR:2 in the last 72 hours   Studies/Results: Ct Guided Abscess Drain  12/05/2011  *RADIOLOGY REPORT*  Clinical history:56 year old with abdominal abscesses.  New abscess collection in the pelvis and existing catheter needs repositioning.  PROCEDURE(S): CT GUIDED EXCHANGE OF THE ANTERIOR ABDOMINAL DRAINAGE CATHETER; CT GUIDED PLACEMENT OF A NEW POSTERIOR PELVIC ABSCESS DRAINAGE CATHETER  Physician: Rachelle Hora. Henn, MD  Medications:Versed 2 mg, Fentanyl 100 mcg. A radiology nurse monitored the patient for moderate sedation.  Moderate sedation time:60 minutes  Procedure:The procedure was explained to the patient.  The risks and benefits of the procedure were discussed and the  patient's questions were addressed.  Informed consent was obtained from the patient.  The patient was placed supine and images through the lower abdomen and pelvis were obtained.  The drain in the anterior pelvic region was identified.  The suture was removed and attempted to pull catheter back.  The catheter would not easily come back. As a result, this area was prepped and draped in a sterile fashion. An Amplatz wire was placed through the drain and the drain was slowly pulled back.  CT imaging confirmed wire placement within the large air-fluid collection within the anterior abdomen.  The catheter was completely removed and a new 12-French drain was reconstituted within the collection.  Large amount of gas was aspirated.  25 ml of purulent fecal material was removed.  The catheter was attached to a suction bulb and secured to the skin. The patient was placed in a left lateral decubitus position and the right gluteal region was prepped and draped in a sterile fashion. 18 gauge needle was directed into the pelvic fluid collection for a transgluteal approach using CT guidance.  Cloudy yellow fluid was aspirated.  Stiff Amplatz wire was placed and a 10-French drain was reconstituted in the abscess collection.  Catheter was attached to a suction bulb and secured to the skin.  Samples of both of fluid collections were sent for Gram stain and culture.  Findings:Large air-fluid collection in the anterior abdomen was not be adequately drained by the existing catheter.  The old catheter was pulled back into the collection and a new drainage catheter  was placed. Majority the air and fluid was aspirated. 25 ml of purulent fecal material was removed from the anterior collection. Successful placement of a drainage catheter within the pelvic fluid collection. 55 ml of cloudy yellow fluid was removed from the pelvic collection.  Complications: None  Impression:Successful CT guided exchange and repositioning of the anterior  abdominal drainage catheter.  Successful CT guided placement of a new pelvic drain.  Original Report Authenticated By: Richarda Overlie, M.D.   Ct Guided Abscess Drain  12/05/2011  *RADIOLOGY REPORT*  Clinical history:56 year old with abdominal abscesses.  New abscess collection in the pelvis and existing catheter needs repositioning.  PROCEDURE(S): CT GUIDED EXCHANGE OF THE ANTERIOR ABDOMINAL DRAINAGE CATHETER; CT GUIDED PLACEMENT OF A NEW POSTERIOR PELVIC ABSCESS DRAINAGE CATHETER  Physician: Rachelle Hora. Henn, MD  Medications:Versed 2 mg, Fentanyl 100 mcg. A radiology nurse monitored the patient for moderate sedation.  Moderate sedation time:60 minutes  Procedure:The procedure was explained to the patient.  The risks and benefits of the procedure were discussed and the patient's questions were addressed.  Informed consent was obtained from the patient.  The patient was placed supine and images through the lower abdomen and pelvis were obtained.  The drain in the anterior pelvic region was identified.  The suture was removed and attempted to pull catheter back.  The catheter would not easily come back. As a result, this area was prepped and draped in a sterile fashion. An Amplatz wire was placed through the drain and the drain was slowly pulled back.  CT imaging confirmed wire placement within the large air-fluid collection within the anterior abdomen.  The catheter was completely removed and a new 12-French drain was reconstituted within the collection.  Large amount of gas was aspirated.  25 ml of purulent fecal material was removed.  The catheter was attached to a suction bulb and secured to the skin. The patient was placed in a left lateral decubitus position and the right gluteal region was prepped and draped in a sterile fashion. 18 gauge needle was directed into the pelvic fluid collection for a transgluteal approach using CT guidance.  Cloudy yellow fluid was aspirated.  Stiff Amplatz wire was placed and a  10-French drain was reconstituted in the abscess collection.  Catheter was attached to a suction bulb and secured to the skin.  Samples of both of fluid collections were sent for Gram stain and culture.  Findings:Large air-fluid collection in the anterior abdomen was not be adequately drained by the existing catheter.  The old catheter was pulled back into the collection and a new drainage catheter was placed. Majority the air and fluid was aspirated. 25 ml of purulent fecal material was removed from the anterior collection. Successful placement of a drainage catheter within the pelvic fluid collection. 55 ml of cloudy yellow fluid was removed from the pelvic collection.  Complications: None  Impression:Successful CT guided exchange and repositioning of the anterior abdominal drainage catheter.  Successful CT guided placement of a new pelvic drain.  Original Report Authenticated By: Richarda Overlie, M.D.    Anti-infectives: Anti-infectives     Start     Dose/Rate Route Frequency Ordered Stop   12/01/11 1100   fluconazole (DIFLUCAN) IVPB 400 mg        400 mg 200 mL/hr over 60 Minutes Intravenous Daily 12/01/11 0937     11/24/11 1800   metroNIDAZOLE (FLAGYL) IVPB 500 mg        500 mg 100 mL/hr over 60 Minutes Intravenous Every  8 hours 11/24/11 1506     11/24/11 1509   ciprofloxacin (CIPRO) IVPB 400 mg        400 mg 200 mL/hr over 60 Minutes Intravenous Every 12 hours 11/24/11 1506     11/24/11 1000   ciprofloxacin (CIPRO) IVPB 400 mg        400 mg 200 mL/hr over 60 Minutes Intravenous  Once 11/24/11 0950 11/24/11 1106   11/24/11 1000   metroNIDAZOLE (FLAGYL) IVPB 500 mg        500 mg 100 mL/hr over 60 Minutes Intravenous  Once 11/24/11 0950 11/24/11 1212   11/24/11 0815   cefTRIAXone (ROCEPHIN) 1 g in dextrose 5 % 50 mL IVPB        1 g 100 mL/hr over 30 Minutes Intravenous  Once 11/24/11 0800 11/24/11 1610         Current Facility-Administered Medications  Medication Dose Route Frequency  Provider Last Rate Last Dose  . 0.9 % NaCl with KCl 20 mEq/ L  infusion   Intravenous Continuous Ardeth Sportsman, MD 20 mL/hr at 12/02/11 1549 20 mL/hr at 12/02/11 1549  . acetaminophen (TYLENOL) solution 650 mg  650 mg Per Tube Q4H PRN Shawn Rayburn, PA      . albuterol (PROVENTIL) (5 MG/ML) 0.5% nebulizer solution 2.5 mg  2.5 mg Nebulization Q6H PRN Ardeth Sportsman, MD   2.5 mg at 12/02/11 2219  . bisacodyl (DULCOLAX) suppository 10 mg  10 mg Rectal Daily Ardeth Sportsman, MD   10 mg at 12/06/11 1100  . ciprofloxacin (CIPRO) IVPB 400 mg  400 mg Intravenous Q12H Rulon Abide, DO   400 mg at 12/06/11 0316  . dextrose 10 % infusion   Intravenous Continuous Ardeth Sportsman, MD 75 mL/hr at 12/04/11 0252    . tpn solution (CLINIMIX E 5/20) 2,000 mL with multivitamins adult 10 mL, trace elements Cr-Cu-Mn-Se-Zn 1 mL, thiamine 100 mg infusion   Intravenous Continuous TPN Randall K Absher, PHARMD 75 mL/hr at 12/05/11 1840     And  . fat emulsion 20 % infusion 250 mL  250 mL Intravenous Continuous TPN Randall K Absher, PHARMD 10 mL/hr at 12/05/11 1840 250 mL at 12/05/11 1840  . fluconazole (DIFLUCAN) IVPB 400 mg  400 mg Intravenous Daily Ardeth Sportsman, MD   400 mg at 12/06/11 1119  . insulin aspart (novoLOG) injection 0-9 Units  0-9 Units Subcutaneous Q8H Randall K Absher, PHARMD   1 Units at 11/29/11 2217  . lip balm (CARMEX) ointment 1 application  1 application Topical BID Ardeth Sportsman, MD   1 application at 12/06/11 0840  . LORazepam (ATIVAN) injection 1 mg  1 mg Intravenous Q6H PRN Velora Heckler, MD   1 mg at 12/06/11 9604  . magic mouthwash  15 mL Oral QID PRN Ardeth Sportsman, MD      . menthol-cetylpyridinium (CEPACOL) lozenge 3 mg  1 lozenge Oral PRN Marianna Fuss, PA   3 mg at 11/29/11 1137  . metroNIDAZOLE (FLAGYL) IVPB 500 mg  500 mg Intravenous Q8H Rulon Abide, DO   500 mg at 12/06/11 1010  . morphine 2 MG/ML injection 2-4 mg  2-4 mg Intravenous Q2H PRN Marianna Fuss, PA   4  mg at 12/06/11 1042  . ondansetron (ZOFRAN) injection 4 mg  4 mg Intravenous Q6H PRN Rulon Abide, DO   4 mg at 12/03/11 5409  . phenol (CHLORASEPTIC) mouth spray 1 spray  1 spray  Mouth/Throat PRN Wilmon Arms. Tsuei, MD   1 spray at 11/26/11 1735  . sodium chloride 0.9 % injection 10-40 mL  10-40 mL Intracatheter PRN Marianna Fuss, PA      . tpn solution (CLINIMIX E 5/20) 2,000 mL with thiamine 100 mg infusion   Intravenous Continuous TPN Ky Barban Absher, PHARMD 75 mL/hr at 12/04/11 1843    . tpn solution (CLINIMIX E 5/20) 2,000 mL with thiamine 100 mg infusion   Intravenous Continuous TPN Jodelle Gross, PHARMD        Assessment/Plan Diverticulitis with abscess and likely associated small bowel obstruction - Second drain placed by IR yesterday and gross feculant/purulent fluid obtained, but this am looks clearer and more seropurulent  Previous Percutaneous Drain 11/26/11/IR- is more serosang drainage now  SBO-Continues NGT and not passing flatus as yet. Continue NG tube  FEN- Continue TNA  ID- Continues Flagyl, Cipro and Diflucan- cultures sent from new drain yesterday pending  VTE risk-SCD's, might be okay for low dose Lovenox or SQ Heparin at this point.  [name]  COPD (chronic obstructive pulmonary disease)    .  Pneumonia    .  Hypertension    Hernia repair  HOH   Plan: I'm going to check abd film tomorrow, give him some clears, and just let NG suck it out.  CMP OK, check cbc in am also.     LOS: 12 days    Safire Gordin 12/06/2011

## 2011-12-07 ENCOUNTER — Inpatient Hospital Stay (HOSPITAL_COMMUNITY): Payer: Medicaid Other

## 2011-12-07 LAB — CULTURE, ROUTINE-ABSCESS

## 2011-12-07 LAB — CBC
HCT: 35.4 % — ABNORMAL LOW (ref 39.0–52.0)
Hemoglobin: 12 g/dL — ABNORMAL LOW (ref 13.0–17.0)
MCHC: 33.9 g/dL (ref 30.0–36.0)
MCV: 89.2 fL (ref 78.0–100.0)
RDW: 14.4 % (ref 11.5–15.5)
WBC: 9.7 10*3/uL (ref 4.0–10.5)

## 2011-12-07 LAB — GLUCOSE, CAPILLARY: Glucose-Capillary: 135 mg/dL — ABNORMAL HIGH (ref 70–99)

## 2011-12-07 MED ORDER — FAT EMULSION 20 % IV EMUL
250.0000 mL | INTRAVENOUS | Status: AC
  Administered 2011-12-07: 250 mL via INTRAVENOUS
  Filled 2011-12-07: qty 250

## 2011-12-07 MED ORDER — TRACE MINERALS CR-CU-MN-SE-ZN 10-1000-500-60 MCG/ML IV SOLN
INTRAVENOUS | Status: AC
  Administered 2011-12-07: 18:00:00 via INTRAVENOUS
  Filled 2011-12-07: qty 2000

## 2011-12-07 NOTE — Progress Notes (Signed)
PARENTERAL NUTRITION CONSULT NOTE - Follow-Up  Pharmacy Consult for TNA Indication: Diverticulitis w/abscess, likely SBO/ileus  Allergies  Allergen Reactions  . Codeine Rash    Patient Measurements: Height: 5\' 9"  (175.3 cm) Weight: 115 lb (52.164 kg) IBW/kg (Calculated) : 70.7   Vital Signs: Temp: 97.3 F (36.3 C) (02/15 0600) Temp src: Oral (02/15 0600) BP: 121/77 mmHg (02/15 0600) Pulse Rate: 70  (02/15 0600)  Intake/Output from previous day:  3461/1638  Net +1823  Labs:  Basename 12/07/11 0500  WBC 9.7  HGB 12.0*  HCT 35.4*  PLT 666*  APTT --  INR --     Basename 12/06/11 0500  NA 132*  K 3.9  CL 93*  CO2 31  GLUCOSE 104*  BUN 16  CREATININE 0.77  LABCREA --  CREAT24HRUR --  CALCIUM 8.9  MG 2.1  PHOS 4.2  PROT 7.1  ALBUMIN 2.6*  AST 24  ALT 8  ALKPHOS 48  BILITOT 0.2*  BILIDIR --  IBILI --  PREALBUMIN --  TRIG --  CHOLHDL --  CHOL --   Estimated Creatinine Clearance: 77 ml/min (by C-G formula based on Cr of 0.77).   CBGs: 113-120  Medications:  Scheduled:     . bisacodyl  10 mg Rectal Daily  . ciprofloxacin  400 mg Intravenous Q12H  . fluconazole (DIFLUCAN) IV  400 mg Intravenous Daily  . insulin aspart  0-9 Units Subcutaneous Q8H  . lip balm  1 application Topical BID  . metronidazole  500 mg Intravenous Q8H   Infusions:     . 0.9 % NaCl with KCl 20 mEq / L 20 mL/hr (12/02/11 1549)  . dextrose 75 mL/hr at 12/04/11 0252  . TPN (CLINIMIX) +/- additives 75 mL/hr at 12/05/11 1840   And  . fat emulsion 250 mL (12/05/11 1840)  . TPN (CLINIMIX) +/- additives 75 mL/hr at 12/06/11 1827   PRN: acetaminophen (TYLENOL) oral liquid 160 mg/5 mL, albuterol, LORazepam, magic mouthwash, menthol-cetylpyridinium, morphine, ondansetron, phenol, sodium chloride  Insulin Requirements in previous 24 hours: None  Current Nutrition:   Clinimix 5/20 at 75mL/hr  NPO except ice chips and minimal amts thin liquids  (mL670 PO on  2/14)  Nutritional Goals:  Per RD assessment 11/28/11:  Kcal:1800-2050  Protein:80-95g  TNA at goal rate of 75ml/hr plus Lipids on MWF to provide 90g protein and 2064 kcal/day (non-lipid days 1584 kcal, avg 1790 kcal/d over a week)  Assessment:  56 y/o M with diverticulitis/abscess and likely SBO.    Tolerating TNA at goal rate  Plan:   Continue Clinimix E 5/20 at goal rate of 61ml/hr.  Fat emulsion 20% at 64mL/hr on MWF only due to ongoing shortage.  Multivitamins and trace elements on MWF only due to ongoing shortage.  Continue thiamine.  Continue routine TNA labs as ordered   Lynann Beaver PharmD, BCPS Pager [phone] 12/07/2011 10:08 AM

## 2011-12-07 NOTE — Progress Notes (Signed)
Subjective: 670 po/700 ng yesterday recorded. Afebrile, WBC is down to 9700, film show allot of air in left abdomen small bowel.  Feels somewhat better, some flatus.  Still hates NG.  73 ml thru Drains  Objective: Vital signs in last 24 hours: Temp:  [97.3 F (36.3 C)-98.8 F (37.1 C)] 97.3 F (36.3 C) 12-17-22 0600) Pulse Rate:  [52-71] 70  12/17/2022 0600) Resp:  [16-18] 18  Dec 17, 2022 0600) BP: (109-121)/(74-79) 121/77 mmHg Dec 17, 2022 0600) SpO2:  [95 %-96 %] 96 % Dec 17, 2022 0600) Last BM Date: 12/06/11  Intake/Output from previous day: 02/14 0701 - 17-Dec-2022 0700 In: 3460.9 [P.O.:670; I.V.:503.8; NG/GT:550; IV Piggyback:300; TPN:1422] Out: 1638 [Urine:865; Emesis/NG output:700; Drains:73] Intake/Output this shift: Total I/O In: 190 [P.O.:90; IV Piggyback:100] Out: 100 [Urine:100]  PE:  Alert, chest: clear  Abd:  Still distended, but not tender.  +BS, +flatus, some stool yesterday.  Lab Results:   Basename December 18, 2011 0500  WBC 9.7  HGB 12.0*  HCT 35.4*  PLT 666*    BMET  Basename 12/06/11 0500  NA 132*  K 3.9  CL 93*  CO2 31  GLUCOSE 104*  BUN 16  CREATININE 0.77  CALCIUM 8.9   PT/INR No results found for this basename: LABPROT:2,INR:2 in the last 72 hours   Studies/Results: Dg Abd 2 Views  Dec 18, 2011  *RADIOLOGY REPORT*  Clinical Data: Bowel obstruction and status post drainage of peritoneal abscess collections.  ABDOMEN - 2 VIEW  Comparison: CT dated 12/03/2011  Findings: There remains prominence of small bowel loops in the left abdomen.  Some sparse air is present in the proximal colon and the rectum.  No evidence of free air.  Anterior and posterior pelvic drainage catheters are present.  IMPRESSION: Persistent prominent left sided small bowel loops.  Original Report Authenticated By: Reola Calkins, M.D.    Anti-infectives: Anti-infectives     Start     Dose/Rate Route Frequency Ordered Stop   12/01/11 1100   fluconazole (DIFLUCAN) IVPB 400 mg        400 mg 200  mL/hr over 60 Minutes Intravenous Daily 12/01/11 0937     11/24/11 1800   metroNIDAZOLE (FLAGYL) IVPB 500 mg        500 mg 100 mL/hr over 60 Minutes Intravenous Every 8 hours 11/24/11 1506     11/24/11 1509   ciprofloxacin (CIPRO) IVPB 400 mg        400 mg 200 mL/hr over 60 Minutes Intravenous Every 12 hours 11/24/11 1506     11/24/11 1000   ciprofloxacin (CIPRO) IVPB 400 mg        400 mg 200 mL/hr over 60 Minutes Intravenous  Once 11/24/11 0950 11/24/11 1106   11/24/11 1000   metroNIDAZOLE (FLAGYL) IVPB 500 mg        500 mg 100 mL/hr over 60 Minutes Intravenous  Once 11/24/11 0950 11/24/11 1212   11/24/11 0815   cefTRIAXone (ROCEPHIN) 1 g in dextrose 5 % 50 mL IVPB        1 g 100 mL/hr over 30 Minutes Intravenous  Once 11/24/11 0800 11/24/11 1610         Current Facility-Administered Medications  Medication Dose Route Frequency Provider Last Rate Last Dose  . 0.9 % NaCl with KCl 20 mEq/ L  infusion   Intravenous Continuous Ardeth Sportsman, MD   20 mL/hr at 12/02/11 1549  . acetaminophen (TYLENOL) solution 650 mg  650 mg Per Tube Q4H PRN Shawn Rayburn, PA      .  albuterol (PROVENTIL) (5 MG/ML) 0.5% nebulizer solution 2.5 mg  2.5 mg Nebulization Q6H PRN Ardeth Sportsman, MD   2.5 mg at 12/02/11 2219  . bisacodyl (DULCOLAX) suppository 10 mg  10 mg Rectal Daily Ardeth Sportsman, MD   10 mg at 12/06/11 1100  . ciprofloxacin (CIPRO) IVPB 400 mg  400 mg Intravenous Q12H Rulon Abide, DO   400 mg at 12/07/11 0129  . dextrose 10 % infusion   Intravenous Continuous Ardeth Sportsman, MD 75 mL/hr at 12/04/11 0252    . tpn solution (CLINIMIX E 5/20) 2,000 mL with multivitamins adult 10 mL, trace elements Cr-Cu-Mn-Se-Zn 1 mL, thiamine 100 mg infusion   Intravenous Continuous TPN Randall K Absher, PHARMD 75 mL/hr at 12/05/11 1840     And  . fat emulsion 20 % infusion 250 mL  250 mL Intravenous Continuous TPN Randall K Absher, PHARMD 10 mL/hr at 12/05/11 1840 250 mL at 12/05/11 1840  .  fluconazole (DIFLUCAN) IVPB 400 mg  400 mg Intravenous Daily Ardeth Sportsman, MD   400 mg at 12/06/11 1119  . insulin aspart (novoLOG) injection 0-9 Units  0-9 Units Subcutaneous Q8H Randall K Absher, PHARMD   1 Units at 11/29/11 2217  . lip balm (CARMEX) ointment 1 application  1 application Topical BID Ardeth Sportsman, MD   1 application at 12/07/11 1000  . LORazepam (ATIVAN) injection 1 mg  1 mg Intravenous Q6H PRN Velora Heckler, MD   1 mg at 12/07/11 0438  . magic mouthwash  15 mL Oral QID PRN Ardeth Sportsman, MD      . menthol-cetylpyridinium (CEPACOL) lozenge 3 mg  1 lozenge Oral PRN Marianna Fuss, PA   3 mg at 11/29/11 1137  . metroNIDAZOLE (FLAGYL) IVPB 500 mg  500 mg Intravenous Q8H Rulon Abide, DO   500 mg at 12/07/11 4696  . morphine 2 MG/ML injection 2-4 mg  2-4 mg Intravenous Q2H PRN Marianna Fuss, PA   2 mg at 12/07/11 0743  . ondansetron (ZOFRAN) injection 4 mg  4 mg Intravenous Q6H PRN Rulon Abide, DO   4 mg at 12/03/11 2952  . phenol (CHLORASEPTIC) mouth spray 1 spray  1 spray Mouth/Throat PRN Wilmon Arms. Tsuei, MD   1 spray at 11/26/11 1735  . sodium chloride 0.9 % injection 10-40 mL  10-40 mL Intracatheter PRN Marianna Fuss, PA   20 mL at 12/07/11 0511  . tpn solution (CLINIMIX E 5/20) 2,000 mL with thiamine 100 mg infusion   Intravenous Continuous TPN Jodelle Gross, PHARMD 75 mL/hr at 12/06/11 1827      Assessment/Plan Diverticulitis with abscess and associated small bowel obstruction  .  COPD (chronic obstructive pulmonary disease)    .  Pneumonia    .  Hypertension    Hernia repair  HOH  Percutaneous Drain 11/26/11/IR  Plan:  Continue current treatment.    LOS: 13 days    Tawnie Ehresman 12/07/2011

## 2011-12-07 NOTE — Progress Notes (Signed)
Subjective: Patient without significant changes  Objective: Vital signs in last 24 hours: Temp:  [97.3 F (36.3 C)-98.8 F (37.1 C)] 98.3 F (36.8 C) 2023/01/01 1415) Pulse Rate:  [70-71] 70  2023/01/01 1415) Resp:  [16-18] 18  01/01/2023 1415) BP: (109-121)/(74-77) 113/74 mmHg 01/01/23 1415) SpO2:  [95 %-97 %] 97 % January 01, 2023 1415) Last BM Date: 12/06/11  Intake/Output from previous day: 02/14 0701 - 01/01/2023 0700 In: 3460.9 [P.O.:670; I.V.:503.8; NG/GT:550; IV Piggyback:300; TPN:1422] Out: 1638 [Urine:865; Emesis/NG output:700; Drains:73] Intake/Output this shift: Total I/O In: 1574.3 [P.O.:270; I.V.:119.3; Other:10; IV Piggyback:500; TPN:675] Out: 1086 [Urine:650; Emesis/NG output:400; Drains:35; Stool:1]  Pelvic drains intact, outputs between 40-50 cc's each drain over last 36 hrs, dressings changed recently  Lab Results:   Basename 01/02/12 0500  WBC 9.7  HGB 12.0*  HCT 35.4*  PLT 666*   BMET  Basename 12/06/11 0500  NA 132*  K 3.9  CL 93*  CO2 31  GLUCOSE 104*  BUN 16  CREATININE 0.77  CALCIUM 8.9   PT/INR No results found for this basename: LABPROT:2,INR:2 in the last 72 hours ABG No results found for this basename: PHART:2,PCO2:2,PO2:2,HCO3:2 in the last 72 hours  Studies/Results: Dg Abd 2 Views  01/02/2012  *RADIOLOGY REPORT*  Clinical Data: Bowel obstruction and status post drainage of peritoneal abscess collections.  ABDOMEN - 2 VIEW  Comparison: CT dated 12/03/2011  Findings: There remains prominence of small bowel loops in the left abdomen.  Some sparse air is present in the proximal colon and the rectum.  No evidence of free air.  Anterior and posterior pelvic drainage catheters are present.  IMPRESSION: Persistent prominent left sided small bowel loops.  Original Report Authenticated By: Reola Calkins, M.D.   Results for orders placed during the hospital encounter of 11/24/11  CULTURE, ROUTINE-ABSCESS     Status: Normal   Collection Time   11/26/11 12:06 PM        Component Value Range Status Comment   Specimen Description ABSCESS PELVIC   Final    Special Requests NONE   Final    Gram Stain     Final    Value: FEW WBC PRESENT,BOTH PMN AND MONONUCLEAR     NO SQUAMOUS EPITHELIAL CELLS SEEN     FEW GRAM POSITIVE COCCI     IN PAIRS RARE GRAM POSITIVE RODS   Culture FEW CANDIDA ALBICANS   Final    Report Status 11/29/2011 FINAL   Final   URINE CULTURE     Status: Normal   Collection Time   11/30/11  8:41 AM      Component Value Range Status Comment   Specimen Description URINE, RANDOM   Final    Special Requests NONE   Final    Culture  Setup Time [id number]   Final    Colony Count NO GROWTH   Final    Culture NO GROWTH   Final    Report Status 12/01/2011 FINAL   Final   CULTURE, ROUTINE-ABSCESS     Status: Normal   Collection Time   12/04/11  2:11 PM      Component Value Range Status Comment   Specimen Description OTHER   Final    Special Requests NONE   Final    Gram Stain     Final    Value: ABUNDANT WBC PRESENT, PREDOMINANTLY PMN     NO SQUAMOUS EPITHELIAL CELLS SEEN     FEW YEAST   Culture FEW CANDIDA ALBICANS   Final  Report Status 12/07/2011 FINAL   Final   CULTURE, ROUTINE-ABSCESS     Status: Normal (Preliminary result)   Collection Time   12/04/11  3:27 PM      Component Value Range Status Comment   Specimen Description OTHER   Final    Special Requests NONE   Final    Gram Stain     Final    Value: ABUNDANT WBC PRESENT,BOTH PMN AND MONONUCLEAR     NO SQUAMOUS EPITHELIAL CELLS SEEN     NO ORGANISMS SEEN   Culture NO GROWTH 2 DAYS   Final    Report Status PENDING   Incomplete     Anti-infectives: Anti-infectives     Start     Dose/Rate Route Frequency Ordered Stop   12/01/11 1100   fluconazole (DIFLUCAN) IVPB 400 mg        400 mg 200 mL/hr over 60 Minutes Intravenous Daily 12/01/11 0937     11/24/11 1800   metroNIDAZOLE (FLAGYL) IVPB 500 mg        500 mg 100 mL/hr over 60 Minutes Intravenous Every 8 hours  11/24/11 1506     11/24/11 1509   ciprofloxacin (CIPRO) IVPB 400 mg        400 mg 200 mL/hr over 60 Minutes Intravenous Every 12 hours 11/24/11 1506     11/24/11 1000   ciprofloxacin (CIPRO) IVPB 400 mg        400 mg 200 mL/hr over 60 Minutes Intravenous  Once 11/24/11 0950 11/24/11 1106   11/24/11 1000   metroNIDAZOLE (FLAGYL) IVPB 500 mg        500 mg 100 mL/hr over 60 Minutes Intravenous  Once 11/24/11 0950 11/24/11 1212   11/24/11 0815   cefTRIAXone (ROCEPHIN) 1 g in dextrose 5 % 50 mL IVPB        1 g 100 mL/hr over 30 Minutes Intravenous  Once 11/24/11 0800 11/24/11 4098          Assessment/Plan: S/p drainage of 2 pelvic/diverticular abscesses 2/4, 2/12; cont current tx as per [facility]; check f/u CT with drain injections early next week   LOS: 13 days    Katrisha Segall,D [facility] 12/07/2011

## 2011-12-08 LAB — CULTURE, ROUTINE-ABSCESS: Culture: NO GROWTH

## 2011-12-08 LAB — GLUCOSE, CAPILLARY
Glucose-Capillary: 123 mg/dL — ABNORMAL HIGH (ref 70–99)
Glucose-Capillary: 121 mg/dL — ABNORMAL HIGH (ref 70–99)
Glucose-Capillary: 114 mg/dL — ABNORMAL HIGH (ref 70–99)

## 2011-12-08 MED ORDER — THIAMINE HCL 100 MG/ML IJ SOLN
INTRAVENOUS | Status: AC
  Administered 2011-12-08: 19:00:00 via INTRAVENOUS
  Filled 2011-12-08: qty 2000

## 2011-12-08 NOTE — Progress Notes (Signed)
Patient ID: Zachary Walls, male   DOB: 11-27-1955, 56 y.o.   MRN: [id number] [facility] Surgery Progress Note:   * No surgery found *  Subjective: Mental status is alert but hard of hearing.  Up walking around disconnecting NG and self sufficient.  Objective: Vital signs in last 24 hours: Temp:  [98.2 F (36.8 C)-99.4 F (37.4 C)] 99.4 F (37.4 C) (02/16 0620) Pulse Rate:  [70-74] 71  (02/16 0620) Resp:  [18-20] 18  (02/16 0620) BP: (103-122)/(71-80) 103/71 mmHg (02/16 0620) SpO2:  [96 %-97 %] 96 % (02/16 0620)  Intake/Output from previous day: 02/15 0701 - 02/16 0700 In: 4234 [P.O.:600; I.V.:410.3; NG/GT:60; IV Piggyback:900; TPN:2243.7] Out: 3356 [Urine:2100; Emesis/NG output:1200; Drains:55; Stool:1] Intake/Output this shift:    Physical Exam: Work of breathing is  Normal.  Two drains with serous drainage  Lab Results:  Results for orders placed during the hospital encounter of 11/24/11 (from the past 48 hour(s))  GLUCOSE, CAPILLARY     Status: Abnormal   Collection Time   12/06/11  1:52 PM      Component Value Range Comment   Glucose-Capillary 117 (*) 70 - 99 (mg/dL)   GLUCOSE, CAPILLARY     Status: Abnormal   Collection Time   12/06/11 11:09 PM      Component Value Range Comment   Glucose-Capillary 113 (*) 70 - 99 (mg/dL)   CBC     Status: Abnormal   Collection Time   12/07/11  5:00 AM      Component Value Range Comment   WBC 9.7  4.0 - 10.5 (K/uL)    RBC 3.97 (*) 4.22 - 5.81 (MIL/uL)    Hemoglobin 12.0 (*) 13.0 - 17.0 (g/dL)    HCT 40.9 (*) 81.1 - 52.0 (%)    MCV 89.2  78.0 - 100.0 (fL)    MCH 30.2  26.0 - 34.0 (pg)    MCHC 33.9  30.0 - 36.0 (g/dL)    RDW 91.4  78.2 - 95.6 (%)    Platelets 666 (*) 150 - 400 (K/uL)   GLUCOSE, CAPILLARY     Status: Abnormal   Collection Time   12/07/11  6:30 AM      Component Value Range Comment   Glucose-Capillary 123 (*) 70 - 99 (mg/dL)   GLUCOSE, CAPILLARY     Status: Abnormal   Collection Time   12/07/11  2:10 PM   Component Value Range Comment   Glucose-Capillary 135 (*) 70 - 99 (mg/dL)   GLUCOSE, CAPILLARY     Status: Abnormal   Collection Time   12/07/11  9:50 PM      Component Value Range Comment   Glucose-Capillary 118 (*) 70 - 99 (mg/dL)   GLUCOSE, CAPILLARY     Status: Abnormal   Collection Time   12/08/11  6:15 AM      Component Value Range Comment   Glucose-Capillary 123 (*) 70 - 99 (mg/dL)     Radiology/Results: Dg Abd 2 Views  12/07/2011  *RADIOLOGY REPORT*  Clinical Data: Bowel obstruction and status post drainage of peritoneal abscess collections.  ABDOMEN - 2 VIEW  Comparison: CT dated 12/03/2011  Findings: There remains prominence of small bowel loops in the left abdomen.  Some sparse air is present in the proximal colon and the rectum.  No evidence of free air.  Anterior and posterior pelvic drainage catheters are present.  IMPRESSION: Persistent prominent left sided small bowel loops.  Original Report Authenticated By: Reola Calkins, M.D.  Anti-infectives: Anti-infectives     Start     Dose/Rate Route Frequency Ordered Stop   12/01/11 1100   fluconazole (DIFLUCAN) IVPB 400 mg        400 mg 200 mL/hr over 60 Minutes Intravenous Daily 12/01/11 0937     11/24/11 1800   metroNIDAZOLE (FLAGYL) IVPB 500 mg        500 mg 100 mL/hr over 60 Minutes Intravenous Every 8 hours 11/24/11 1506     11/24/11 1509   ciprofloxacin (CIPRO) IVPB 400 mg        400 mg 200 mL/hr over 60 Minutes Intravenous Every 12 hours 11/24/11 1506     11/24/11 1000   ciprofloxacin (CIPRO) IVPB 400 mg        400 mg 200 mL/hr over 60 Minutes Intravenous  Once 11/24/11 0950 11/24/11 1106   11/24/11 1000   metroNIDAZOLE (FLAGYL) IVPB 500 mg        500 mg 100 mL/hr over 60 Minutes Intravenous  Once 11/24/11 0950 11/24/11 1212   11/24/11 0815   cefTRIAXone (ROCEPHIN) 1 g in dextrose 5 % 50 mL IVPB        1 g 100 mL/hr over 30 Minutes Intravenous  Once 11/24/11 0800 11/24/11 2956           Assessment/Plan: Problem List: Patient Active Problem List  Diagnoses  . Diverticulitis of large intestine with perforation/abscess  . COPD (chronic obstructive pulmonary disease)  . Hypertension  . SBO (small bowel obstruction)  . HOH (hard of hearing)    Scant flatus.  Will give sham clear liquids with NG to intermittent suction * No surgery found *    LOS: 14 days   Matt B. Daphine Deutscher, MD, [facility] [phone] beeper [phone]  12/08/2011 9:30 AM

## 2011-12-08 NOTE — Progress Notes (Signed)
PARENTERAL NUTRITION CONSULT NOTE - Follow-Up  Pharmacy Consult for TNA Indication: Diverticulitis w/abscess, likely SBO/ileus  Allergies  Allergen Reactions  . Codeine Rash    Patient Measurements: Height: 5\' 9"  (175.3 cm) Weight: 115 lb (52.164 kg) IBW/kg (Calculated) : 70.7   Vital Signs: Temp: 99.4 F (37.4 C) (02/16 0620) Temp src: Oral (02/16 0620) BP: 103/71 mmHg (02/16 0620) Pulse Rate: 71  (02/16 0620)  Intake/Output from previous day:  4234/3356 Net +878  Labs:  Basename 12/07/11 0500  WBC 9.7  HGB 12.0*  HCT 35.4*  PLT 666*  APTT --  INR --     Basename 12/06/11 0500  NA 132*  K 3.9  CL 93*  CO2 31  GLUCOSE 104*  BUN 16  CREATININE 0.77  LABCREA --  CREAT24HRUR --  CALCIUM 8.9  MG 2.1  PHOS 4.2  PROT 7.1  ALBUMIN 2.6*  AST 24  ALT 8  ALKPHOS 48  BILITOT 0.2*  BILIDIR --  IBILI --  PREALBUMIN --  TRIG --  CHOLHDL --  CHOL --   Estimated Creatinine Clearance: 77 ml/min (by C-G formula based on Cr of 0.77).   CBGs: 118, 123  Medications:  Scheduled:     . bisacodyl  10 mg Rectal Daily  . ciprofloxacin  400 mg Intravenous Q12H  . fluconazole (DIFLUCAN) IV  400 mg Intravenous Daily  . insulin aspart  0-9 Units Subcutaneous Q8H  . lip balm  1 application Topical BID  . metronidazole  500 mg Intravenous Q8H   Infusions:     . 0.9 % NaCl with KCl 20 mEq / L 20 mL/hr at 12/07/11 1757  . dextrose 75 mL/hr at 12/04/11 0252  . TPN (CLINIMIX) +/- additives 75 mL/hr at 12/07/11 1809   And  . fat emulsion 250 mL (12/07/11 1809)  . TPN (CLINIMIX) +/- additives 75 mL/hr at 12/06/11 1827   PRN: acetaminophen (TYLENOL) oral liquid 160 mg/5 mL, albuterol, LORazepam, magic mouthwash, menthol-cetylpyridinium, morphine, ondansetron, phenol, sodium chloride  Insulin Requirements in previous 24 hours: None  Current Nutrition:   Clinimix 5/20 at 28mL/hr  NPO except ice chips and minimal amts thin liquids   Nutritional Goals:  Per RD  assessment 11/28/11:  Kcal:1800-2050  Protein:80-95g  TNA at goal rate of 31ml/hr plus Lipids on MWF to provide 90g protein and 2064 kcal/day (non-lipid days 1584 kcal, avg 1790 kcal/d over a week)  Assessment:  55 y/o M with diverticulitis/abscess and likely SBO.    NG output decreasing  No labs to evaluate this AM  Tolerating TNA at goal rate  Plan:   Continue Clinimix E 5/20 at goal rate of 32ml/hr.    No lipids today.  Given on MWF only due to ongoing shortage.  Multivitamins and trace elements on MWF only due to ongoing shortage.  Will continue to add thiamine 100mg  to TNA.  Continue routine TNA labs as ordered for Monday AM.  Clance Boll, PharmD Pager: [phone] 12/08/2011 7:57 AM

## 2011-12-09 ENCOUNTER — Inpatient Hospital Stay (HOSPITAL_COMMUNITY): Payer: Medicaid Other

## 2011-12-09 LAB — GLUCOSE, CAPILLARY: Glucose-Capillary: 128 mg/dL — ABNORMAL HIGH (ref 70–99)

## 2011-12-09 MED ORDER — THIAMINE HCL 100 MG/ML IJ SOLN
INTRAMUSCULAR | Status: AC
  Administered 2011-12-09: 18:00:00 via INTRAVENOUS
  Filled 2011-12-09: qty 2000

## 2011-12-09 NOTE — Progress Notes (Signed)
  Subjective: Poor pain control - especially at RLQ and NG tube site.  C/o poor sleep secondary to pain. Pain with flushing.   Objective: Vital signs in last 24 hours: Temp:  [98.6 F (37 C)-100.1 F (37.8 C)] 99.7 F (37.6 C) (02/17 0515) Pulse Rate:  [64-72] 70  (02/17 0515) Resp:  [18] 18  (02/17 0515) BP: (107-126)/(70-83) 126/83 mmHg (02/17 0515) SpO2:  [95 %] 95 % (02/17 0515) Last BM Date: 12/08/11  Intake/Output from previous day: 02/16 0701 - 02/17 0700 In: 4300.8 [P.O.:1500; I.V.:461.3; NG/GT:30; IV Piggyback:900; TPN:1349.5] Out: 4375 [Urine:1625; Emesis/NG output:2700; Drains:50] Intake/Output this shift: Total I/O In: 240 [P.O.:240] Out: -   PE;  Just back to bed; NGT intact.  Two percutaneous drains in place, LLQ with some purulence noted, transgluteal drain more serous.  Transgluteal drain with 45 ML output last 48 hours,  RLQ drain with 60 ML output last 48 hours.  Cultures with GPR and candida.    Lab Results:   Basename 12/07/11 0500  WBC 9.7  HGB 12.0*  HCT 35.4*  PLT 666*   Anti-infectives: Anti-infectives     Start     Dose/Rate Route Frequency Ordered Stop   12/01/11 1100   fluconazole (DIFLUCAN) IVPB 400 mg        400 mg 200 mL/hr over 60 Minutes Intravenous Daily 12/01/11 0937     11/24/11 1800   metroNIDAZOLE (FLAGYL) IVPB 500 mg        500 mg 100 mL/hr over 60 Minutes Intravenous Every 8 hours 11/24/11 1506     11/24/11 1509   ciprofloxacin (CIPRO) IVPB 400 mg        400 mg 200 mL/hr over 60 Minutes Intravenous Every 12 hours 11/24/11 1506     11/24/11 1000   ciprofloxacin (CIPRO) IVPB 400 mg        400 mg 200 mL/hr over 60 Minutes Intravenous  Once 11/24/11 0950 11/24/11 1106   11/24/11 1000   metroNIDAZOLE (FLAGYL) IVPB 500 mg        500 mg 100 mL/hr over 60 Minutes Intravenous  Once 11/24/11 0950 11/24/11 1212   11/24/11 0815   cefTRIAXone (ROCEPHIN) 1 g in dextrose 5 % 50 mL IVPB        1 g 100 mL/hr over 30 Minutes  Intravenous  Once 11/24/11 0800 11/24/11 7829          Assessment/Plan: Pelvic abscess s/p 2 percutaneous drains. WBC improving since placement - but with poor pain control.  Will decrease flushing amount and timing as well as d/c suction to JP bulbs to see if assists with pain control. Other pain meds per primary team MD. IR will continue to follow. Rec. F/u CT once output </= 10ml above flushing regimen - now 10ml daily per drain.    LOS: 15 days    Meldon Hanzlik D 12/09/2011
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
## 2011-12-09 NOTE — Progress Notes (Signed)
PARENTERAL NUTRITION CONSULT NOTE - Follow-Up  Pharmacy Consult for TNA Indication: Diverticulitis w/abscess, likely SBO/ileus  Allergies  Allergen Reactions  . Codeine Rash    Patient Measurements: Height: 5\' 9"  (175.3 cm) Weight: 115 lb (52.164 kg) IBW/kg (Calculated) : 70.7   Vital Signs: Temp: 99.7 F (37.6 C) (02/17 0515) Temp src: Oral (02/17 0515) BP: 126/83 mmHg (02/17 0515) Pulse Rate: 70  (02/17 0515)  Intake/Output from previous day:  4300/4375 Net -75  Labs:  Basename 12/07/11 0500  WBC 9.7  HGB 12.0*  HCT 35.4*  PLT 666*  APTT --  INR --    No results found for this basename: NA:3,K:3,CL:3,CO2:3,GLUCOSE:3,BUN:3,CREATININE:3,LABCREA:3,CREAT24HRUR:3,CALCIUM:3,MG:3,PHOS:3,PROT:3,ALBUMIN:3,AST:3,ALT:3,ALKPHOS:3,BILITOT:3,BILIDIR:3,IBILI:3,PREALBUMIN:3,TRIG:3,CHOLHDL:3,CHOL:3 in the last 72 hours Estimated Creatinine Clearance: 77 ml/min (by C-G formula based on Cr of 0.77).   CBGs: 114, 125  Medications:  Scheduled:     . bisacodyl  10 mg Rectal Daily  . ciprofloxacin  400 mg Intravenous Q12H  . fluconazole (DIFLUCAN) IV  400 mg Intravenous Daily  . insulin aspart  0-9 Units Subcutaneous Q8H  . lip balm  1 application Topical BID  . metronidazole  500 mg Intravenous Q8H   Infusions:     . 0.9 % NaCl with KCl 20 mEq / L 20 mL/hr (12/08/11 1047)  . dextrose 75 mL/hr at 12/04/11 0252  . TPN (CLINIMIX) +/- additives 75 mL/hr at 12/07/11 1809   And  . fat emulsion 250 mL (12/07/11 1809)  . TPN (CLINIMIX) +/- additives 75 mL/hr at 12/08/11 1833   PRN: acetaminophen (TYLENOL) oral liquid 160 mg/5 mL, albuterol, LORazepam, magic mouthwash, menthol-cetylpyridinium, morphine, ondansetron, phenol, sodium chloride  Insulin Requirements in previous 24 hours: None  Current Nutrition:   Clinimix 5/20 at 14mL/hr  Clear liquid diet, started 2/16  Nutritional Goals:  Per RD assessment 11/28/11:  Kcal:1800-2050  Protein:80-95g  TNA at goal rate of  58ml/hr plus Lipids on MWF to provide 90g protein and 2064 kcal/day (non-lipid days 1584 kcal, avg 1790 kcal/d over a week)  Assessment:  56 y/o M with diverticulitis/abscess and likely SBO.    NG output decreasing, switched to intermittent suction  No labs to evaluate this AM  Tolerating TNA at goal rate  Plan:   Continue Clinimix E 5/20 at goal rate of 54ml/hr.    No lipids today.  Given on MWF only due to ongoing shortage.  Multivitamins and trace elements on MWF only due to ongoing shortage.  Will continue to add thiamine 100mg  to TNA.  Considering CBGs have been controlled while on TNA, will discontinue the SSI.  F/u TNA labs in AM.  Clance Boll, PharmD Pager: [phone] 12/09/2011 8:04 AM

## 2011-12-10 ENCOUNTER — Other Ambulatory Visit: Payer: Self-pay | Admitting: Urology

## 2011-12-10 LAB — COMPREHENSIVE METABOLIC PANEL
ALT: 10 U/L (ref 0–53)
AST: 24 U/L (ref 0–37)
Albumin: 2.3 g/dL — ABNORMAL LOW (ref 3.5–5.2)
Alkaline Phosphatase: 54 U/L (ref 39–117)
Chloride: 92 mEq/L — ABNORMAL LOW (ref 96–112)
Potassium: 3.9 mEq/L (ref 3.5–5.1)
Total Bilirubin: 0.4 mg/dL (ref 0.3–1.2)

## 2011-12-10 LAB — CBC
HCT: 34.8 % — ABNORMAL LOW (ref 39.0–52.0)
Hemoglobin: 11.7 g/dL — ABNORMAL LOW (ref 13.0–17.0)
MCH: 29.8 pg (ref 26.0–34.0)
MCHC: 33.6 g/dL (ref 30.0–36.0)

## 2011-12-10 LAB — GLUCOSE, CAPILLARY
Glucose-Capillary: 115 mg/dL — ABNORMAL HIGH (ref 70–99)
Glucose-Capillary: 116 mg/dL — ABNORMAL HIGH (ref 70–99)

## 2011-12-10 LAB — PREALBUMIN: Prealbumin: 13.8 mg/dL — ABNORMAL LOW (ref 17.0–34.0)

## 2011-12-10 LAB — DIFFERENTIAL
Basophils Relative: 1 % (ref 0–1)
Eosinophils Absolute: 0.2 10*3/uL (ref 0.0–0.7)
Monocytes Absolute: 1.4 10*3/uL — ABNORMAL HIGH (ref 0.1–1.0)
Monocytes Relative: 12 % (ref 3–12)
Neutro Abs: 8.1 10*3/uL — ABNORMAL HIGH (ref 1.7–7.7)

## 2011-12-10 LAB — TYPE AND SCREEN

## 2011-12-10 MED ORDER — TRACE MINERALS CR-CU-MN-SE-ZN 10-1000-500-60 MCG/ML IV SOLN
INTRAVENOUS | Status: AC
  Administered 2011-12-10: 18:00:00 via INTRAVENOUS
  Filled 2011-12-10: qty 2000

## 2011-12-10 MED ORDER — FAT EMULSION 20 % IV EMUL
250.0000 mL | INTRAVENOUS | Status: AC
  Administered 2011-12-10: 250 mL via INTRAVENOUS
  Filled 2011-12-10: qty 250

## 2011-12-10 NOTE — Plan of Care (Signed)
Problem: Inadequate Intake (NI-2.1) Goal: Food and/or nutrient delivery Individualized approach for food/nutrient provision.  Outcome: Not Progressing Pt NPO, relying on TNA for nutrition      

## 2011-12-10 NOTE — Progress Notes (Signed)
Nutrition Follow-up  TPN/Lipids were held on 2/12 for CT study with IV contrast. TPN restarted on 2/13.  Attempted to speak to pt, however, pt was asleep. Pt diet advanced to clear liquids on 2/16. NG tube remained in place; this was a "sham feeding". NG tube would suction most of the liquids consumed by pt.   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  Diet Order:  NPO, previously on clear liquid diet  TPN: Clinimix E 5/20 @ 75 ml/hr. Lipids (20% IVFE @ 10 ml/hr), multivitamins, and trace elements are provided 3 times weekly (MWF) due to national backorder. Pt to receive lipids today. This provides 1790 kcal and 90 grams protein daily (based on weekly average). Meets 99% minimum estimated kcal and 100% minimum estimated protein needs.  Supplementation of thiamine to TPN.  Meds: Scheduled Meds:   . bisacodyl  10 mg Rectal Daily  . ciprofloxacin  400 mg Intravenous Q12H  . fluconazole (DIFLUCAN) IV  400 mg Intravenous Daily  . lip balm  1 application Topical BID  . metronidazole  500 mg Intravenous Q8H   Continuous Infusions:   . 0.9 % NaCl with KCl 20 mEq / L 20 mL/hr (12/08/11 1047)  . dextrose 75 mL/hr at 12/04/11 0252  . TPN (CLINIMIX) +/- additives     And  . fat emulsion    . TPN (CLINIMIX) +/- additives 75 mL/hr at 12/08/11 1833  . TPN (CLINIMIX) +/- additives 75 mL/hr at 12/09/11 1747   PRN Meds:.acetaminophen (TYLENOL) oral liquid 160 mg/5 mL, albuterol, LORazepam, magic mouthwash, menthol-cetylpyridinium, morphine, ondansetron, phenol, sodium chloride  Labs:  CMP     Component Value Date/Time   NA 129* 12/10/2011 0430   K 3.9 12/10/2011 0430   CL 92* 12/10/2011 0430   CO2 31 12/10/2011 0430   GLUCOSE 91 12/10/2011 0430   BUN 15 12/10/2011 0430   CREATININE 0.73 12/10/2011 0430   CALCIUM 8.6 12/10/2011 0430   PROT 6.9 12/10/2011 0430   ALBUMIN 2.3* 12/10/2011 0430   AST 24 12/10/2011 0430   ALT 10 12/10/2011 0430   ALKPHOS 54 12/10/2011 0430   BILITOT 0.4 12/10/2011 0430   GFRNONAA >90 12/10/2011 0430   GFRAA >90 12/10/2011 0430  Sodium continues to be low but is stable. Albumin remains relatively stable; this will likely remain low due to inflammatory response from diverticulitis and pelvic abscess  Pre-albumin: 11.2 on 2/11, 3.5 on 2/7 (greatly improved)  Intake/Output Summary (Last 24 hours) at 12/10/11 1151 Last data filed at 12/10/11 0823  Gross per 24 hour  Intake 4334.08 ml  Output   2955 ml  Net 1379.08 ml  NG output: 1750 ml so far today Pelvic drains: 30 ml so far today Last BM: 12/07/11  Weight Status:  No new weights  Re-estimated needs:  1800-2050 kcal and 80-95 grams protein (needs unchanged)  Nutrition Dx:  Inadequate oral intake, ongoing.  Goal:  TNA to meet >90% of estimated needs. Met.   Intervention:    Recommend re-weigh pt  TNA per pharmacy  After surgery, advance diet as able per MD  Monitor:  Weight trends, TNA, labs, diet advancement  Karenann Cai Pager #:  [phone]

## 2011-12-10 NOTE — Progress Notes (Signed)
Patient ID: Zachary Walls, male   DOB: 1956-07-24, 56 y.o.   MRN: [id number]    Subjective: Pt is very HOH.  His hearing aids are broken.  He states he has localized pain mostly on his right side, in the mid abdomen.  Passing only minimal flatus.  Objective: Vital signs in last 24 hours: Temp:  [98.2 F (36.8 C)-99.7 F (37.6 C)] 98.6 F (37 C) (02/18 0545) Pulse Rate:  [66-69] 69  (02/18 0545) Resp:  [16-18] 16  (02/18 0545) BP: (111-132)/(72-75) 119/75 mmHg (02/18 0545) SpO2:  [96 %-98 %] 97 % (02/18 0545) Last BM Date: 12/10/11  Intake/Output from previous day: 17-Dec-2022 0701 - 02/18 0700 In: 4814.1 [P.O.:1320; I.V.:480.3; IV Piggyback:700; TPN:2293.8] Out: 3005 [Urine:1225; Emesis/NG output:1750; Drains:30] Intake/Output this shift: Total I/O In: -  Out: 100 [Urine:100]  PE: Abd: soft, mild distention, few BS, NGT with significant amount of bilious output.  JP drains both with only serous output. Only 30cc of output between the 2 yesterday.  Tender in suprapubic and LLQ as well as RMQ. Heart: regular Lungs: CTAB  Lab Results:   Basename 12/10/11 0430  WBC 11.5*  HGB 11.7*  HCT 34.8*  PLT 544*   BMET  Basename 12/10/11 0430  NA 129*  K 3.9  CL 92*  CO2 31  GLUCOSE 91  BUN 15  CREATININE 0.73  CALCIUM 8.6   PT/INR No results found for this basename: LABPROT:2,INR:2 in the last 72 hours   Studies/Results: Dg Abd 2 Views  12-18-2011  *RADIOLOGY REPORT*  Clinical Data: Small bowel obstruction and diverticulitis.  ABDOMEN - 2 VIEW  Comparison: Abdominal radiographs 12/07/2011 and CT abdomen pelvis 11/24/2011  Findings: Nasogastric tube coiled in the fundus of the stomach and terminates in the distal stomach.  Stomach appears decompressed. There are persistent a dilated small bowel loops, particularly in the left abdomen.  Small bowel dilatation appears slightly more prominent on the supine view of today's examination compared to 12/07/2011.  There are no dilated loops  in the right abdomen.  Scattered locules of gas seen in the proximal colon, and one locule of gas seen in the region of the sigmoid colon.  Two right-sided pelvic drainage catheters are stable.  No free intraperitoneal air identified.  The lung bases are clear.  IMPRESSION: Small bowel obstruction.  Marked small bowel dilatation in the left abdomen appears slightly progressive compared to 12/07/2011.  A small bowel loop measures up to 6.8 cm.  Original Report Authenticated By: Britta Mccreedy, M.D.    Anti-infectives: Anti-infectives     Start     Dose/Rate Route Frequency Ordered Stop   12/01/11 1100   fluconazole (DIFLUCAN) IVPB 400 mg        400 mg 200 mL/hr over 60 Minutes Intravenous Daily 12/01/11 0937     11/24/11 1800   metroNIDAZOLE (FLAGYL) IVPB 500 mg        500 mg 100 mL/hr over 60 Minutes Intravenous Every 8 hours 11/24/11 1506     11/24/11 1509   ciprofloxacin (CIPRO) IVPB 400 mg        400 mg 200 mL/hr over 60 Minutes Intravenous Every 12 hours 11/24/11 1506     11/24/11 1000   ciprofloxacin (CIPRO) IVPB 400 mg        400 mg 200 mL/hr over 60 Minutes Intravenous  Once 11/24/11 0950 11/24/11 1106   11/24/11 1000   metroNIDAZOLE (FLAGYL) IVPB 500 mg        500 mg  100 mL/hr over 60 Minutes Intravenous  Once 11/24/11 0950 11/24/11 1212   11/24/11 0815   cefTRIAXone (ROCEPHIN) 1 g in dextrose 5 % 50 mL IVPB        1 g 100 mL/hr over 30 Minutes Intravenous  Once 11/24/11 0800 11/24/11 0852           Assessment/Plan  1. Diverticulitis, with microperforation and 2 abdominal abscess, s/p perc drain 2. SBO, secondary to #1. 3. HOH 4. COPD 5. HTN  Plan: 1. The patient has been here now for 16 days.  He does not appear to be improving with conservative management, despite placement of 2 JP drains.  He now has a SBO with an NGT and is only passing minimal flatus.  He will likely require a laparotomy with a colostomy in order to get him ultimately better.  I have discussed  this with the patient.  He has a son and a daughter whom he is going to call to let them know this is a strong possibility.  I informed him that Dr. Dwain Sarna would come around later to further discuss surgical intervention.  The patient states he is scared about the possibility of surgery, but is agreeable that he does not feel he is making significant improvements. 2. Cont IV abx and drains and await decision on surgery. 3. Pt refuses to wear SCDs and is not on any chemical prophylaxis.  If an operation is put on hold then we will need to start chemical prophylaxis.   LOS: 16 days    Zachary Walls E 12/10/2011

## 2011-12-10 NOTE — Progress Notes (Addendum)
PARENTERAL NUTRITION CONSULT NOTE - Follow-Up  Pharmacy Consult for TNA Indication: Diverticulitis w/abscess, partial SBO  Allergies  Allergen Reactions  . Codeine Rash    Patient Measurements: Height: 5\' 9"  (175.3 cm) Weight: 115 lb (52.164 kg) IBW/kg (Calculated) : 70.7   Vital Signs: Temp: 98.6 F (37 C) (02/18 0545) Temp src: Oral (02/18 0545) BP: 119/75 mmHg (02/18 0545) Pulse Rate: 69  (02/18 0545)  Intake/Output from previous day:  4814/3005   (NG output 1.8L) Net + 1.8L  Labs:  Basename 12/10/11 0430  WBC 11.5*  HGB 11.7*  HCT 34.8*  PLT 544*  APTT --  INR --     Basename 12/10/11 0430  NA 129*  K 3.9  CL 92*  CO2 31  GLUCOSE 91  BUN 15  CREATININE 0.73  LABCREA --  CREAT24HRUR --  CALCIUM 8.6  MG 1.9  PHOS 4.0  PROT 6.9  ALBUMIN 2.3*  AST 24  ALT 10  ALKPHOS 54  BILITOT 0.4  BILIDIR --  IBILI --  PREALBUMIN --  TRIG --  CHOLHDL --  CHOL --   Estimated Creatinine Clearance: 77 ml/min (by C-G formula based on Cr of 0.73).   CBGs: 115-134  Medications:  Scheduled:     . bisacodyl  10 mg Rectal Daily  . ciprofloxacin  400 mg Intravenous Q12H  . fluconazole (DIFLUCAN) IV  400 mg Intravenous Daily  . lip balm  1 application Topical BID  . metronidazole  500 mg Intravenous Q8H  . DISCONTD: insulin aspart  0-9 Units Subcutaneous Q8H   Infusions:     . 0.9 % NaCl with KCl 20 mEq / L 20 mL/hr (12/08/11 1047)  . dextrose 75 mL/hr at 12/04/11 0252  . TPN (CLINIMIX) +/- additives 75 mL/hr at 12/08/11 1833  . TPN (CLINIMIX) +/- additives 75 mL/hr at 12/09/11 1747   PRN: acetaminophen (TYLENOL) oral liquid 160 mg/5 mL, albuterol, LORazepam, magic mouthwash, menthol-cetylpyridinium, morphine, ondansetron, phenol, sodium chloride  Insulin Requirements in previous 24 hours:  Sliding scale insulin was DCd 12/09/11  Current Nutrition:   Clinimix 5/20 at 24mL/hr  Clear liquid diet as "sham feeding" started 2/16.  (NG still in  place).  Nutritional Goals:  Per RD assessment 11/28/11:  Kcal:1800-2050  Protein:80-95g  TNA at goal rate of 48ml/hr plus Lipids on MWF to provide 90g/d protein and 2064 Kcal/d on lipid days, 1584 Kcal/d on non-lipid days (avg 1790 KCal/d over a week).  Assessment:  56 y/o M with diverticulitis/abscess and superimposed partial SBO.    Electrolytes wnl except Na and Cl slightly low.  LFTs, Triglycerides, CBGs acceptable.  Tolerating TNA at goal rate.  Prealbumin pending.  Plan:   Continue Clinimix E 5/20 at goal rate of 80ml/hr.  Lipids 20% at 46ml/hr on MWF only due to ongoing Sport and exercise psychologist.  Multivitamins and trace elements on MWF only due to ongoing Sport and exercise psychologist.  Continue thiamine 100mg  daily in each 2L TNA.  F/U on prealbumin when result available.  Elie Goody, PharmD, BCPS Pager: [phone] 12/10/2011  7:55 AM

## 2011-12-10 NOTE — Progress Notes (Signed)
I have reviewed entire history and imaging.  I have also examined him and he continues to have llq tenderness on exam.  He has diverticulitis with 2 abscesses that have been drained percutaneously.  He has a bowel obstruction clinically and radiologically.  His ng tube is putting out bilious fluid.  I recommended to him today that he has failed conservative therapy and I think the only choice is to proceed now with sigmoid colectomy and colostomy.  I have also discussed this with his daughter today.  We discussed risks and benefits of operation in detail as well as what his postoperative course and restrictions will be. Will plan on doing this tomorrow so family can be involved.

## 2011-12-11 ENCOUNTER — Inpatient Hospital Stay (HOSPITAL_COMMUNITY): Payer: Medicaid Other | Admitting: Anesthesiology

## 2011-12-11 ENCOUNTER — Encounter (HOSPITAL_COMMUNITY): Payer: Self-pay | Admitting: Anesthesiology

## 2011-12-11 ENCOUNTER — Other Ambulatory Visit (INDEPENDENT_AMBULATORY_CARE_PROVIDER_SITE_OTHER): Payer: Self-pay | Admitting: General Surgery

## 2011-12-11 ENCOUNTER — Encounter (HOSPITAL_COMMUNITY)
Admission: EM | Disposition: A | Discharge status: Skilled Nursing Facility | Payer: Self-pay | Source: Home / Self Care | Attending: General Surgery

## 2011-12-11 DIAGNOSIS — E46 Unspecified protein-calorie malnutrition: Secondary | ICD-10-CM

## 2011-12-11 DIAGNOSIS — K56609 Unspecified intestinal obstruction, unspecified as to partial versus complete obstruction: Secondary | ICD-10-CM

## 2011-12-11 HISTORY — PX: CYSTOSCOPY W/ URETERAL STENT PLACEMENT: SHX1429

## 2011-12-11 HISTORY — PX: BOWEL RESECTION: SHX1257

## 2011-12-11 HISTORY — PX: COLOSTOMY: SHX63

## 2011-12-11 HISTORY — PX: COLOSTOMY REVISION: SHX5232

## 2011-12-11 LAB — CBC
HCT: 34.8 % — ABNORMAL LOW (ref 39.0–52.0)
MCH: 30.2 pg (ref 26.0–34.0)
MCV: 87.7 fL (ref 78.0–100.0)
Platelets: 479 10*3/uL — ABNORMAL HIGH (ref 150–400)
RBC: 3.97 MIL/uL — ABNORMAL LOW (ref 4.22–5.81)

## 2011-12-11 LAB — BASIC METABOLIC PANEL
BUN: 14 mg/dL (ref 6–23)
CO2: 31 mEq/L (ref 19–32)
Calcium: 8.7 mg/dL (ref 8.4–10.5)
Creatinine, Ser: 0.75 mg/dL (ref 0.50–1.35)
Glucose, Bld: 123 mg/dL — ABNORMAL HIGH (ref 70–99)

## 2011-12-11 LAB — GLUCOSE, CAPILLARY: Glucose-Capillary: 137 mg/dL — ABNORMAL HIGH (ref 70–99)

## 2011-12-11 SURGERY — COLECTOMY, SIGMOID, OPEN
Anesthesia: General | Wound class: Dirty or Infected

## 2011-12-11 MED ORDER — LIDOCAINE HCL (CARDIAC) 20 MG/ML IV SOLN
INTRAVENOUS | Status: DC | PRN
  Administered 2011-12-11: 60 mg via INTRAVENOUS

## 2011-12-11 MED ORDER — 0.9 % SODIUM CHLORIDE (POUR BTL) OPTIME
TOPICAL | Status: DC | PRN
  Administered 2011-12-11: 1000 mL

## 2011-12-11 MED ORDER — LABETALOL HCL 5 MG/ML IV SOLN
INTRAVENOUS | Status: DC | PRN
  Administered 2011-12-11 (×2): 5 mg via INTRAVENOUS

## 2011-12-11 MED ORDER — FAT EMULSION 20 % IV EMUL
INTRAVENOUS | Status: DC | PRN
  Administered 2011-12-11 (×4): 10 mL via INTRAVENOUS

## 2011-12-11 MED ORDER — NALOXONE HCL 0.4 MG/ML IJ SOLN: 0.4000 mg | INTRAMUSCULAR | Status: DC | PRN

## 2011-12-11 MED ORDER — SODIUM CHLORIDE 0.9 % IV SOLN
INTRAVENOUS | Status: DC
  Administered 2011-12-11 – 2011-12-20 (×11): via INTRAVENOUS

## 2011-12-11 MED ORDER — GLYCOPYRROLATE 0.2 MG/ML IJ SOLN
INTRAMUSCULAR | Status: DC | PRN
  Administered 2011-12-11: .6 mg via INTRAVENOUS

## 2011-12-11 MED ORDER — ONDANSETRON HCL 4 MG/2ML IJ SOLN
4.0000 mg | Freq: Four times a day (QID) | INTRAMUSCULAR | Status: DC | PRN
  Administered 2011-12-13 – 2011-12-19 (×11): 4 mg via INTRAVENOUS
  Filled 2011-12-11 (×2): qty 2

## 2011-12-11 MED ORDER — METOPROLOL TARTRATE 1 MG/ML IV SOLN
2.0000 mg | Freq: Once | INTRAVENOUS | Status: AC
  Administered 2011-12-11: 2 mg via INTRAVENOUS

## 2011-12-11 MED ORDER — STERILE WATER FOR IRRIGATION IR SOLN
Status: DC | PRN
  Administered 2011-12-11: 3000 mL via INTRAVESICAL

## 2011-12-11 MED ORDER — INSULIN ASPART 100 UNIT/ML ~~LOC~~ SOLN
SUBCUTANEOUS | Status: DC | PRN
  Administered 2011-12-11: 2 [IU] via INTRAVENOUS

## 2011-12-11 MED ORDER — MEPERIDINE HCL 50 MG/ML IJ SOLN: 6.2500 mg | INTRAMUSCULAR | Status: DC | PRN

## 2011-12-11 MED ORDER — LACTATED RINGERS IV SOLN
INTRAVENOUS | Status: DC
  Administered 2011-12-11 (×2): 1000 mL via INTRAVENOUS

## 2011-12-11 MED ORDER — HETASTARCH-ELECTROLYTES 6 % IV SOLN
INTRAVENOUS | Status: DC | PRN
  Administered 2011-12-11: 12:00:00 via INTRAVENOUS

## 2011-12-11 MED ORDER — LACTATED RINGERS IV SOLN
INTRAVENOUS | Status: DC
  Administered 2011-12-11: 14:00:00 via INTRAVENOUS
  Administered 2011-12-11: 1000 mL via INTRAVENOUS
  Administered 2011-12-11: 15:00:00 via INTRAVENOUS

## 2011-12-11 MED ORDER — CISATRACURIUM BESYLATE 2 MG/ML IV SOLN
INTRAVENOUS | Status: DC | PRN
  Administered 2011-12-11: 8 mg via INTRAVENOUS
  Administered 2011-12-11: 2 mg via INTRAVENOUS
  Administered 2011-12-11: 4 mg via INTRAVENOUS
  Administered 2011-12-11: 1 mg via INTRAVENOUS
  Administered 2011-12-11 (×2): 6 mg via INTRAVENOUS

## 2011-12-11 MED ORDER — NEOSTIGMINE METHYLSULFATE 1 MG/ML IJ SOLN
INTRAMUSCULAR | Status: DC | PRN
  Administered 2011-12-11: 4 mg via INTRAVENOUS

## 2011-12-11 MED ORDER — SUFENTANIL CITRATE 50 MCG/ML IV SOLN
INTRAVENOUS | Status: DC | PRN
  Administered 2011-12-11 (×4): 10 ug via INTRAVENOUS
  Administered 2011-12-11: 5 ug via INTRAVENOUS
  Administered 2011-12-11 (×4): 10 ug via INTRAVENOUS
  Administered 2011-12-11: 15 ug via INTRAVENOUS

## 2011-12-11 MED ORDER — SODIUM CHLORIDE 0.9 % IJ SOLN: 9.0000 mL | INTRAMUSCULAR | Status: DC | PRN

## 2011-12-11 MED ORDER — SODIUM CHLORIDE 0.9 % IV SOLN
1.0000 g | INTRAVENOUS | Status: AC
  Administered 2011-12-11 – 2011-12-18 (×8): 1 g via INTRAVENOUS
  Filled 2011-12-11 (×10): qty 1

## 2011-12-11 MED ORDER — PANTOPRAZOLE SODIUM 40 MG IV SOLR
40.0000 mg | Freq: Every day | INTRAVENOUS | Status: DC
  Administered 2011-12-12 – 2011-12-24 (×13): 40 mg via INTRAVENOUS
  Filled 2011-12-11 (×15): qty 40

## 2011-12-11 MED ORDER — SUCCINYLCHOLINE CHLORIDE 20 MG/ML IJ SOLN
INTRAMUSCULAR | Status: DC | PRN
  Administered 2011-12-11: 60 mg via INTRAVENOUS

## 2011-12-11 MED ORDER — THIAMINE HCL 100 MG/ML IJ SOLN
INTRAVENOUS | Status: AC
  Administered 2011-12-11: 18:00:00 via INTRAVENOUS
  Filled 2011-12-11: qty 2000

## 2011-12-11 MED ORDER — KETAMINE HCL 10 MG/ML IJ SOLN
INTRAMUSCULAR | Status: DC | PRN
  Administered 2011-12-11: 10 mg via INTRAVENOUS
  Administered 2011-12-11: 20 mg via INTRAVENOUS
  Administered 2011-12-11 (×4): 5 mg via INTRAVENOUS

## 2011-12-11 MED ORDER — HYDROMORPHONE HCL PF 1 MG/ML IJ SOLN
0.2500 mg | INTRAMUSCULAR | Status: DC | PRN
  Administered 2011-12-11 (×4): 0.5 mg via INTRAVENOUS

## 2011-12-11 MED ORDER — ALTEPLASE 2 MG IJ SOLR
2.0000 mg | Freq: Once | INTRAMUSCULAR | Status: AC
  Administered 2011-12-11: 2 mg
  Filled 2011-12-11: qty 2

## 2011-12-11 MED ORDER — PROPOFOL 10 MG/ML IV EMUL
INTRAVENOUS | Status: DC | PRN
  Administered 2011-12-11: 120 mg via INTRAVENOUS

## 2011-12-11 MED ORDER — MIDAZOLAM HCL 5 MG/5ML IJ SOLN
INTRAMUSCULAR | Status: DC | PRN
  Administered 2011-12-11 (×2): 1 mg via INTRAVENOUS

## 2011-12-11 MED ORDER — DIPHENHYDRAMINE HCL 50 MG/ML IJ SOLN: 12.5000 mg | Freq: Four times a day (QID) | INTRAMUSCULAR | Status: DC | PRN

## 2011-12-11 MED ORDER — HYDROMORPHONE 0.3 MG/ML IV SOLN
INTRAVENOUS | Status: DC
  Administered 2011-12-11: 0.3 mg via INTRAVENOUS
  Administered 2011-12-12: 02:00:00 via INTRAVENOUS
  Administered 2011-12-12: 3 mg via INTRAVENOUS
  Administered 2011-12-12: 4.2 mg via INTRAVENOUS
  Administered 2011-12-12: 2.7 mg via INTRAVENOUS
  Administered 2011-12-12: 1.8 mg via INTRAVENOUS
  Administered 2011-12-12: 1.5 mg via INTRAVENOUS
  Administered 2011-12-12: 2.4 mg via INTRAVENOUS
  Administered 2011-12-13: 22:00:00 via INTRAVENOUS
  Administered 2011-12-13: 1.2 mg via INTRAVENOUS
  Administered 2011-12-13: 02:00:00 via INTRAVENOUS
  Administered 2011-12-13: 1.9 mg via INTRAVENOUS
  Administered 2011-12-13 (×3): 0.9 mg via INTRAVENOUS
  Administered 2011-12-13: 1.2 mg via INTRAVENOUS
  Administered 2011-12-14: 0.3 mg via INTRAVENOUS
  Administered 2011-12-14: 37.5 mg via INTRAVENOUS
  Administered 2011-12-14: 1.5 mg via INTRAVENOUS
  Administered 2011-12-14: 1.8 mg via INTRAVENOUS
  Administered 2011-12-15: 1.5 mg via INTRAVENOUS
  Administered 2011-12-15: 2.7 mg via INTRAVENOUS
  Administered 2011-12-15: 1.2 mg via INTRAVENOUS
  Administered 2011-12-15: 1.5 mg via INTRAVENOUS
  Administered 2011-12-15: 0.9 mg via INTRAVENOUS
  Administered 2011-12-15: 1.2 mg via INTRAVENOUS
  Administered 2011-12-16: 1.75 mg via INTRAVENOUS
  Administered 2011-12-16: 1.2 mg via INTRAVENOUS
  Administered 2011-12-16: 2.4 mg via INTRAVENOUS
  Administered 2011-12-16: 0.9 mg via INTRAVENOUS
  Administered 2011-12-16: 0.3 mg via INTRAVENOUS
  Administered 2011-12-16: 0.9 mg via INTRAVENOUS
  Administered 2011-12-17: 16:00:00 via INTRAVENOUS
  Administered 2011-12-17: 2.4 mg via INTRAVENOUS
  Administered 2011-12-17: 1.5 mg via INTRAVENOUS
  Administered 2011-12-17: 03:00:00 via INTRAVENOUS
  Administered 2011-12-17: 2.4 mg via INTRAVENOUS
  Administered 2011-12-17: 1.8 mg via INTRAVENOUS
  Administered 2011-12-17: 0.6 mg via INTRAVENOUS
  Administered 2011-12-18: 2.4 mg via INTRAVENOUS
  Administered 2011-12-18 (×2): 2.1 mg via INTRAVENOUS
  Administered 2011-12-18: 0.6 mg via INTRAVENOUS
  Administered 2011-12-18: 19:00:00 via INTRAVENOUS
  Administered 2011-12-18: 2.7 mg via INTRAVENOUS
  Administered 2011-12-18: 2.1 mg via INTRAVENOUS
  Administered 2011-12-19: 0.9 mg via INTRAVENOUS
  Administered 2011-12-19: 1.5 mg via INTRAVENOUS
  Administered 2011-12-19: 2.7 mg via INTRAVENOUS
  Administered 2011-12-19: 4.8 mg via INTRAVENOUS
  Administered 2011-12-19: 3.6 mg via INTRAVENOUS
  Administered 2011-12-19: 2.4 mg via INTRAVENOUS
  Administered 2011-12-20: 1.5 mg via INTRAVENOUS
  Administered 2011-12-20: 2.7 mg via INTRAVENOUS
  Administered 2011-12-20: 1.5 mg via INTRAVENOUS
  Filled 2011-12-11 (×2): qty 25

## 2011-12-11 MED ORDER — HYDROMORPHONE HCL PF 1 MG/ML IJ SOLN
INTRAMUSCULAR | Status: DC | PRN
  Administered 2011-12-11: 1 mg via INTRAVENOUS

## 2011-12-11 MED ORDER — PROMETHAZINE HCL 25 MG/ML IJ SOLN: 6.2500 mg | INTRAMUSCULAR | Status: DC | PRN

## 2011-12-11 MED ORDER — ONDANSETRON HCL 4 MG/2ML IJ SOLN
INTRAMUSCULAR | Status: DC | PRN
  Administered 2011-12-11: 4 mg via INTRAVENOUS

## 2011-12-11 MED ORDER — DEXAMETHASONE SODIUM PHOSPHATE 4 MG/ML IJ SOLN
INTRAMUSCULAR | Status: DC | PRN
  Administered 2011-12-11: 4 mg via INTRAVENOUS

## 2011-12-11 MED ORDER — DIPHENHYDRAMINE HCL 12.5 MG/5ML PO ELIX: 12.5000 mg | ORAL_SOLUTION | Freq: Four times a day (QID) | ORAL | Status: DC | PRN

## 2011-12-11 SURGICAL SUPPLY — 79 items
ADAPTER GOLDBERG URETERAL (ADAPTER) ×1 IMPLANT
ADPR CATH 15X14FR FL DRN BG (ADAPTER) ×3
APPLICATOR COTTON TIP 6IN STRL (MISCELLANEOUS) ×6 IMPLANT
BAG URO CATCHER STRL LF (DRAPE) ×4 IMPLANT
BLADE EXTENDED COATED 6.5IN (ELECTRODE) ×1 IMPLANT
BLADE HEX COATED 2.75 (ELECTRODE) ×4 IMPLANT
BLADE SURG SZ10 CARB STEEL (BLADE) ×4 IMPLANT
CANISTER SUCTION 2500CC (MISCELLANEOUS) ×4 IMPLANT
CATH MALLECOT 28FR (CATHETERS) ×1 IMPLANT
CATH URET 5FR 28IN OPEN ENDED (CATHETERS) ×4 IMPLANT
CLIP TI LARGE 6 (CLIP) IMPLANT
CLOTH BEACON ORANGE TIMEOUT ST (SAFETY) ×7 IMPLANT
COVER MAYO STAND STRL (DRAPES) ×4 IMPLANT
DRAIN CHANNEL 19F RND (DRAIN) ×2 IMPLANT
DRAPE CAMERA CLOSED 9X96 (DRAPES) ×4 IMPLANT
DRAPE LAPAROSCOPIC ABDOMINAL (DRAPES) ×4 IMPLANT
DRAPE LG THREE QUARTER DISP (DRAPES) ×1 IMPLANT
DRAPE WARM FLUID 44X44 (DRAPE) ×4 IMPLANT
DRSG PAD ABDOMINAL 8X10 ST (GAUZE/BANDAGES/DRESSINGS) ×1 IMPLANT
DRSG VAC ATS MED SENSATRAC (GAUZE/BANDAGES/DRESSINGS) ×1 IMPLANT
ELECT REM PT RETURN 9FT ADLT (ELECTROSURGICAL) ×4
ELECTRODE REM PT RTRN 9FT ADLT (ELECTROSURGICAL) ×3 IMPLANT
ENSEAL DEVICE STD TIP 35CM (ENDOMECHANICALS) ×3 IMPLANT
EVACUATOR DRAINAGE 10X20 100CC (DRAIN) IMPLANT
EVACUATOR SILICONE 100CC (DRAIN) ×8
GLOVE BIO SURGEON STRL SZ7.5 (GLOVE) ×9 IMPLANT
GLOVE BIOGEL PI IND STRL 7.0 (GLOVE) ×3 IMPLANT
GLOVE BIOGEL PI IND STRL 7.5 (GLOVE) ×6 IMPLANT
GLOVE BIOGEL PI INDICATOR 7.0 (GLOVE) ×1
GLOVE BIOGEL PI INDICATOR 7.5 (GLOVE) ×3
GLOVE SURG SS PI 8.0 STRL IVOR (GLOVE) ×4 IMPLANT
GOWN PREVENTION PLUS XLARGE (GOWN DISPOSABLE) ×6 IMPLANT
GOWN STRL NON-REIN LRG LVL3 (GOWN DISPOSABLE) ×4 IMPLANT
GOWN STRL REIN XL XLG (GOWN DISPOSABLE) ×11 IMPLANT
HAND ACTIVATED (MISCELLANEOUS) IMPLANT
KIT BASIN OR (CUSTOM PROCEDURE TRAY) ×4 IMPLANT
LEGGING LITHOTOMY PAIR STRL (DRAPES) ×4 IMPLANT
LIGASURE IMPACT 36 18CM CVD LR (INSTRUMENTS) ×4 IMPLANT
MANIFOLD NEPTUNE II (INSTRUMENTS) ×4 IMPLANT
MARKER SKIN DUAL TIP RULER LAB (MISCELLANEOUS) ×4 IMPLANT
NS IRRIG 1000ML POUR BTL (IV SOLUTION) ×8 IMPLANT
PACK CYSTO (CUSTOM PROCEDURE TRAY) ×4 IMPLANT
PACK GENERAL/GYN (CUSTOM PROCEDURE TRAY) ×4 IMPLANT
RELOAD PROXIMATE 75MM BLUE (ENDOMECHANICALS) ×28 IMPLANT
RELOAD PROXIMATE TA60MM BLUE (ENDOMECHANICALS) ×4 IMPLANT
RELOAD STAPLE 60 BLU REG PROX (ENDOMECHANICALS) IMPLANT
RELOAD STAPLE 75 3.8 BLU REG (ENDOMECHANICALS) IMPLANT
SPONGE DRAIN TRACH 4X4 STRL 2S (GAUZE/BANDAGES/DRESSINGS) ×1 IMPLANT
SPONGE GAUZE 4X4 12PLY (GAUZE/BANDAGES/DRESSINGS) ×4 IMPLANT
SPONGE LAP 18X18 X RAY DECT (DISPOSABLE) ×2 IMPLANT
STAPLER CUT CVD 40MM GREEN (STAPLE) ×1 IMPLANT
STAPLER GUN LINEAR PROX 60 (STAPLE) ×1 IMPLANT
STAPLER PROXIMATE 75MM BLUE (STAPLE) ×2 IMPLANT
STAPLER VISISTAT 35W (STAPLE) ×4 IMPLANT
SUCTION POOLE TIP (SUCTIONS) ×5 IMPLANT
SUT ETHILON 2 0 PS N (SUTURE) ×4 IMPLANT
SUT NOV 1 T60/GS (SUTURE) IMPLANT
SUT NOVA NAB DX-16 0-1 5-0 T12 (SUTURE) ×1 IMPLANT
SUT NOVA T20/GS 25 (SUTURE) ×6 IMPLANT
SUT PDS AB 1 CTX 36 (SUTURE) ×8 IMPLANT
SUT PDS AB 1 TP1 54 (SUTURE) IMPLANT
SUT PDS AB 1 TP1 96 (SUTURE) ×6 IMPLANT
SUT PROLENE 2 0 CT 1 (SUTURE) ×1 IMPLANT
SUT PROLENE 2 0 KS (SUTURE) IMPLANT
SUT SILK 2 0 (SUTURE) ×8
SUT SILK 2 0 SH CR/8 (SUTURE) ×4 IMPLANT
SUT SILK 2 0SH CR/8 30 (SUTURE) IMPLANT
SUT SILK 2-0 18XBRD TIE 12 (SUTURE) ×3 IMPLANT
SUT SILK 2-0 30XBRD TIE 12 (SUTURE) IMPLANT
SUT SILK 3 0 (SUTURE) ×4
SUT SILK 3 0 SH CR/8 (SUTURE) ×7 IMPLANT
SUT SILK 3-0 18XBRD TIE 12 (SUTURE) ×6 IMPLANT
TAPE CLOTH SURG 4X10 WHT LF (GAUZE/BANDAGES/DRESSINGS) ×1 IMPLANT
TOWEL OR 17X26 10 PK STRL BLUE (TOWEL DISPOSABLE) ×9 IMPLANT
TRAY FOLEY BAG SILVER LF 14FR (CATHETERS) ×1 IMPLANT
TRAY FOLEY CATH 14FRSI W/METER (CATHETERS) ×4 IMPLANT
TUBING CONNECTING 10 (TUBING) ×3 IMPLANT
WATER STERILE IRR 1500ML POUR (IV SOLUTION) IMPLANT
YANKAUER SUCT BULB TIP NO VENT (SUCTIONS) ×4 IMPLANT

## 2011-12-11 NOTE — Preoperative (Addendum)
Beta Blockers   Reason not to administer Beta Blockers:Not Applicable 

## 2011-12-11 NOTE — Anesthesia Preprocedure Evaluation (Addendum)
Anesthesia Evaluation  Patient identified by MRN, date of birth, ID band Patient awake    Reviewed: Allergy & Precautions, H&P , NPO status , Patient's Chart, lab work & pertinent test results  Airway Mallampati: II TM Distance: >3 FB Neck ROM: Full    Dental No notable dental hx. (+) Teeth Intact   Pulmonary neg pulmonary ROS, COPD clear to auscultation  Pulmonary exam normal       Cardiovascular hypertension, neg cardio ROS Regular Normal    Neuro/Psych Negative Neurological ROS  Negative Psych ROS   GI/Hepatic negative GI ROS, Neg liver ROS,   Endo/Other  Negative Endocrine ROS  Renal/GU negative Renal ROS  Genitourinary negative   Musculoskeletal negative musculoskeletal ROS (+)   Abdominal   Peds negative pediatric ROS (+)  Hematology negative hematology ROS (+)   Anesthesia Other Findings   Reproductive/Obstetrics negative OB ROS                           Anesthesia Physical Anesthesia Plan  ASA: III  Anesthesia Plan: General   Post-op Pain Management:    Induction: Intravenous  Airway Management Planned: Oral ETT  Additional Equipment:   Intra-op Plan:   Post-operative Plan: Extubation in OR  Informed Consent: I have reviewed the patients History and Physical, chart, labs and discussed the procedure including the risks, benefits and alternatives for the proposed anesthesia with the patient or authorized representative who has indicated his/her understanding and acceptance.   Dental advisory given  Plan Discussed with: CRNA  Anesthesia Plan Comments:         Anesthesia [facility] Evaluation

## 2011-12-11 NOTE — Progress Notes (Signed)
PARENTERAL NUTRITION CONSULT NOTE - Follow-Up  Pharmacy Consult for TNA Indication: Diverticulitis w/abscess, partial SBO  Allergies  Allergen Reactions  . Codeine Rash    Patient Measurements: Height: 5\' 9"  (175.3 cm) Weight: 115 lb (52.164 kg) IBW/kg (Calculated) : 70.7   Vital Signs: Temp: 98.8 F (37.1 C) (02/19 1610) Temp src: Oral (02/19 9604) BP: 114/79 mmHg (02/19 5409) Pulse Rate: 76  (02/19 0638)  Intake/Output from previous day:  4814/3005   (NG output 1.8L) Net + 1.8L  Labs:  [facility] 12/11/11 0550 12/10/11 0430  WBC 12.9* 11.5*  HGB 12.0* 11.7*  HCT 34.8* 34.8*  PLT 479* 544*  APTT -- --  INR -- --     Basename 12/11/11 0550 12/10/11 1115 12/10/11 0430  NA 130* -- 129*  K 4.0 -- 3.9  CL 94* -- 92*  CO2 31 -- 31  GLUCOSE 123* -- 91  BUN 14 -- 15  CREATININE 0.75 -- 0.73  LABCREA -- -- --  CREAT24HRUR -- -- --  CALCIUM 8.7 -- 8.6  MG -- -- 1.9  PHOS -- -- 4.0  PROT -- -- 6.9  ALBUMIN -- -- 2.3*  AST -- -- 24  ALT -- -- 10  ALKPHOS -- -- 54  BILITOT -- -- 0.4  BILIDIR -- -- --  IBILI -- -- --  PREALBUMIN -- 14.6* 13.8*  TRIG -- -- 47  CHOLHDL -- -- --  CHOL -- -- 56   Estimated Creatinine Clearance: 77 ml/min (by C-G formula based on Cr of 0.75).   CBGs: 115-134  Medications:  Scheduled:     . alteplase  2 mg Intracatheter Once  . bisacodyl  10 mg Rectal Daily  . ertapenem  1 g Intravenous Q24H  . fluconazole (DIFLUCAN) IV  400 mg Intravenous Daily  . lip balm  1 application Topical BID  . DISCONTD: ciprofloxacin  400 mg Intravenous Q12H  . DISCONTD: metronidazole  500 mg Intravenous Q8H   Infusions:     . 0.9 % NaCl with KCl 20 mEq / L 20 mL/hr at 12/10/11 2228  . dextrose 75 mL/hr at 12/04/11 0252  . TPN (CLINIMIX) +/- additives 75 mL/hr at 12/10/11 1815   And  . fat emulsion 250 mL (12/10/11 1815)  . TPN (CLINIMIX) +/- additives 75 mL/hr at 12/09/11 1747   PRN: acetaminophen (TYLENOL) oral liquid 160 mg/5 mL,  albuterol, LORazepam, magic mouthwash, menthol-cetylpyridinium, morphine, ondansetron, phenol, sodium chloride  Insulin Requirements in previous 24 hours:  Sliding scale insulin was DCd 12/09/11  Current Nutrition:   Clinimix 5/20 at 67mL/hr  Clear liquid diet as "sham feeding" started 2/16.  (NG still in place).  Currently NPO for surgery today.  Nutritional Goals:  Per RD assessment 11/28/11:  Kcal:1800-2050  Protein:80-95g  TNA at goal rate of 59ml/hr plus Lipids on MWF to provide 90g/d protein and 2064 Kcal/d on lipid days, 1584 Kcal/d on non-lipid days (avg 1790 KCal/d over a week).  Assessment:  56 y/o M with diverticulitis/abscess and superimposed SBO.    Electrolytes today drawn while TNA off for surgery  Prealbumin slowly improving 11.2->13.8->14.6  LFTs, Triglycerides, CBGs acceptable.  Tolerating TNA at goal rate.  Plan for sigmoid colectomy, colostomy today as he is not improving with medical therapy  Plan:   Continue Clinimix E 5/20 at goal rate of 26ml/hr.  Lipids 20% at 50ml/hr on MWF only due to ongoing Sport and exercise psychologist.  Multivitamins and trace elements on MWF only due to ongoing Sport and exercise psychologist.  Continue thiamine 100mg  daily in each 2L TNA.  Loralee Pacas, PharmD, BCPS Pager: [phone] 12/11/2011  7:38 AM
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
## 2011-12-11 NOTE — Anesthesia Postprocedure Evaluation (Signed)
  Anesthesia Post-op Note  Patient: Zachary Walls  Procedure(s) Performed: Procedure(s) (LRB): COLOSTOMY (N/A) CYSTOSCOPY WITH STENT REPLACEMENT (Left) COLON RESECTION SIGMOID () SMALL BOWEL RESECTION () INCISION AND DRAINAGE ABSCESS () GASTROSTOMY TUBE (N/A)  Patient Location: PACU  Anesthesia Type: General  Level of Consciousness: awake and alert   Airway and Oxygen Therapy: Patient Spontanous Breathing  Post-op Pain: mild  Post-op Assessment: Post-op Vital signs reviewed, Patient's Cardiovascular Status Stable, Respiratory Function Stable, Patent Airway and No signs of Nausea or vomiting  Post-op Vital Signs: stable  Complications: No apparent anesthesia complications

## 2011-12-11 NOTE — Anesthesia Procedure Notes (Signed)
Procedure Name: Intubation Date/Time: 12/11/2011 11:37 AM Performed by: Lurlean Leyden, Bern Fare L. Patient Re-evaluated:Patient Re-evaluated prior to inductionOxygen Delivery Method: Circle System Utilized Preoxygenation: Pre-oxygenation with 100% oxygen Intubation Type: IV induction, Cricoid Pressure applied and Rapid sequence Laryngoscope Size: Miller and 3 Grade View: Grade I Tube type: Oral Tube size: 8.0 mm Number of attempts: 1 Airway Equipment and Method: stylet Placement Confirmation: ETT inserted through vocal cords under direct vision,  breath sounds checked- equal and bilateral and positive ETCO2 Secured at: 22 cm Tube secured with: Tape Dental Injury: Teeth and Oropharynx as per pre-operative assessment

## 2011-12-11 NOTE — Transfer of Care (Signed)
Immediate Anesthesia Transfer of Care Note  Patient: Zachary Walls  Procedure(s) Performed: Procedure(s) (LRB): COLOSTOMY (N/A) CYSTOSCOPY WITH STENT REPLACEMENT (Left) COLON RESECTION SIGMOID () SMALL BOWEL RESECTION () INCISION AND DRAINAGE ABSCESS () GASTROSTOMY TUBE (N/A)  Patient Location: PACU  Anesthesia Type: General  Level of Consciousness: awake, oriented, sedated and patient cooperative  Airway & Oxygen Therapy: Patient Spontanous Breathing and Patient connected to face mask oxygen  Post-op Assessment: Report given to PACU RN, Post -op Vital signs reviewed and stable and Patient moving all extremities  Post vital signs: Reviewed and stable  Complications: No apparent anesthesia complications

## 2011-12-11 NOTE — Brief Op Note (Signed)
11/24/2011 - 12/11/2011  12:02 PM  PATIENT:  Zachary Walls  56 y.o. male  PRE-OPERATIVE DIAGNOSIS:  diverticulosis   POST-OPERATIVE DIAGNOSIS:  Same.  PROCEDURE:  Procedure(s) (LRB): CYSTOSCOPY WITH STENT REPLACEMENT (Left)  SURGEON:  Surgeon(s) and Role: Panel 2:    * Anner Crete, MD - Primary  PHYSICIAN ASSISTANT:   ASSISTANTS: none   ANESTHESIA:   general  EBL:  Total I/O In: 132.5 [I.V.:55; IV Piggyback:50; TPN:27.5] Out: 200 [Emesis/NG output:200]  BLOOD ADMINISTERED:none  DRAINS: Urinary Catheter (Foley) and 24fr left ureteral catheter.   LOCAL MEDICATIONS USED:  NONE  SPECIMEN:  No Specimen  DISPOSITION OF SPECIMEN:  N/A  COUNTS:  YES  TOURNIQUET:  * No tourniquets in log *  DICTATION: .Other Dictation: Dictation Number [id number]  PLAN OF CARE: Admit to inpatient   PATIENT DISPOSITION:  Dr. Dwain Sarna began his portion of the procedure after I was done.   Delay start of Pharmacological VTE agent (>24hrs) due to surgical blood loss or risk of bleeding: Per Dwain Sarna.

## 2011-12-11 NOTE — Progress Notes (Signed)
  Subjective: Still with abdominal pain, somewhat disoriented when awakened this am, minimal flatus  Objective: Vital signs in last 24 hours: Temp:  [98.8 F (37.1 C)-99.9 F (37.7 C)] 98.8 F (37.1 C) (02/19 4540) Pulse Rate:  [69-77] 76  (02/19 0638) Resp:  [16-18] 18  (02/19 9811) BP: (114-117)/(74-79) 114/79 mmHg (02/19 0638) SpO2:  [96 %-97 %] 96 % (02/19 9147) Last BM Date: 12/08/11  Intake/Output from previous day: 02/18 0701 - 02/19 0700 In: 3344.5 [P.O.:640; I.V.:724; TPN:1970.5] Out: 1845 [Urine:1075; Emesis/NG output:750; Drains:20] Intake/Output this shift:    GI: drains in place with serous fluid, llq tender to palpation, bilious fluid in ng  Lab Results:   Basename 12/11/11 0550 12/10/11 0430  WBC 12.9* 11.5*  HGB 12.0* 11.7*  HCT 34.8* 34.8*  PLT 479* 544*   BMET  Basename 12/11/11 0550 12/10/11 0430  NA 130* 129*  K 4.0 3.9  CL 94* 92*  CO2 31 31  GLUCOSE 123* 91  BUN 14 15  CREATININE 0.75 0.73  CALCIUM 8.7 8.6   Studies/Results: Dg Abd 2 Views  12-14-2011  *RADIOLOGY REPORT*  Clinical Data: Small bowel obstruction and diverticulitis.  ABDOMEN - 2 VIEW  Comparison: Abdominal radiographs 12/07/2011 and CT abdomen pelvis 11/24/2011  Findings: Nasogastric tube coiled in the fundus of the stomach and terminates in the distal stomach.  Stomach appears decompressed. There are persistent a dilated small bowel loops, particularly in the left abdomen.  Small bowel dilatation appears slightly more prominent on the supine view of today's examination compared to 12/07/2011.  There are no dilated loops in the right abdomen.  Scattered locules of gas seen in the proximal colon, and one locule of gas seen in the region of the sigmoid colon.  Two right-sided pelvic drainage catheters are stable.  No free intraperitoneal air identified.  The lung bases are clear.  IMPRESSION: Small bowel obstruction.  Marked small bowel dilatation in the left abdomen appears slightly  progressive compared to 12/07/2011.  A small bowel loop measures up to 6.8 cm.  Original Report Authenticated By: Britta Mccreedy, M.D.     Assessment/Plan: Diverticulitis with bowel obstruction, multiple drains WBC improving, still with tenderness, no improvement in bowel obstruction for a number of days. I have started seeing him yesterday.  He clearly is not improving with medical therapy and we have discussed colectomy, colostomy.  Also plan on ureteral stent.  I have discussed risks of procedure and postop course with him and have spoken with his daughter Baird Lyons over the phone also.  I don't think there is any other choice.  I am concerned about his nutritional state and deconditioning due to length of stay prior to this.  LOS: 17 days    [facility] 12/11/2011

## 2011-12-12 LAB — BASIC METABOLIC PANEL
BUN: 21 mg/dL (ref 6–23)
CO2: 29 mEq/L (ref 19–32)
CO2: 32 mEq/L (ref 19–32)
Calcium: 7.7 mg/dL — ABNORMAL LOW (ref 8.4–10.5)
Chloride: 95 mEq/L — ABNORMAL LOW (ref 96–112)
GFR calc non Af Amer: >90 mL/min (ref >90)
Glucose, Bld: 178 mg/dL — ABNORMAL HIGH (ref 70–99)
Glucose, Bld: 121 mg/dL — ABNORMAL HIGH (ref 70–99)
Potassium: 5.4 mEq/L — ABNORMAL HIGH (ref 3.5–5.1)
Potassium: 4.9 mEq/L (ref 3.5–5.1)
Sodium: 127 mEq/L — ABNORMAL LOW (ref 135–145)
Sodium: 129 mEq/L — ABNORMAL LOW (ref 135–145)

## 2011-12-12 LAB — CBC
HCT: 35 % — ABNORMAL LOW (ref 39.0–52.0)
Hemoglobin: 11.5 g/dL — ABNORMAL LOW (ref 13.0–17.0)
MCH: 29.7 pg (ref 26.0–34.0)
MCHC: 33.1 g/dL (ref 30.0–36.0)
MCHC: 33.3 g/dL (ref 30.0–36.0)
MCV: 89.1 fL (ref 78.0–100.0)
MCV: 89.5 fL (ref 78.0–100.0)
Platelets: 412 10*3/uL — ABNORMAL HIGH (ref 150–400)
RBC: 3.87 MIL/uL — ABNORMAL LOW (ref 4.22–5.81)
RDW: 14.5 % (ref 11.5–15.5)
WBC: 20.9 10*3/uL — ABNORMAL HIGH (ref 4.0–10.5)

## 2011-12-12 LAB — COMPREHENSIVE METABOLIC PANEL
AST: 31 U/L (ref 0–37)
Albumin: 1.8 g/dL — ABNORMAL LOW (ref 3.5–5.2)
Alkaline Phosphatase: 45 U/L (ref 39–117)
Chloride: 98 mEq/L (ref 96–112)
Potassium: 5.6 mEq/L — ABNORMAL HIGH (ref 3.5–5.1)
Total Bilirubin: 0.4 mg/dL (ref 0.3–1.2)
Total Protein: 5.2 g/dL — ABNORMAL LOW (ref 6.0–8.3)

## 2011-12-12 LAB — GLUCOSE, CAPILLARY: Glucose-Capillary: 128 mg/dL — ABNORMAL HIGH (ref 70–99)

## 2011-12-12 LAB — POCT I-STAT 4, (NA,K, GLUC, HGB,HCT): Hemoglobin: 11.9 g/dL — ABNORMAL LOW (ref 13.0–17.0)

## 2011-12-12 MED ORDER — FAT EMULSION 20 % IV EMUL
250.0000 mL | INTRAVENOUS | Status: AC
  Administered 2011-12-12: 250 mL via INTRAVENOUS
  Filled 2011-12-12: qty 250

## 2011-12-12 MED ORDER — TRACE MINERALS CR-CU-MN-SE-ZN 10-1000-500-60 MCG/ML IV SOLN
INTRAVENOUS | Status: AC
  Administered 2011-12-12: 18:00:00 via INTRAVENOUS
  Filled 2011-12-12: qty 2000

## 2011-12-12 MED ORDER — HEPARIN SODIUM (PORCINE) 5000 UNIT/ML IJ SOLN
5000.0000 [IU] | Freq: Three times a day (TID) | INTRAMUSCULAR | Status: DC
  Administered 2011-12-12 – 2011-12-16 (×15): 5000 [IU] via SUBCUTANEOUS
  Filled 2011-12-12 (×20): qty 1

## 2011-12-12 NOTE — Op Note (Signed)
Zachary Walls, Zachary Walls NO.:  [id number]  MEDICAL RECORD NO.:  [id number]  LOCATION:                                 FACILITY:  PHYSICIAN:  Excell Seltzer. Annabell Howells, M.D.    DATE OF BIRTH:  December 31, 1955  DATE OF PROCEDURE: DATE OF DISCHARGE:                              OPERATIVE REPORT   PREOPERATIVE DIAGNOSIS:  Sigmoid diverticulitis.  POSTOPERATIVE DIAGNOSIS:  Sigmoid diverticulitis.  PROCEDURE:  Cystoscopy, insertion of left ureteral catheter.  SURGEON:  Excell Seltzer. Annabell Howells, M.D.  ANESTHESIA:  General.  SPECIMEN:  None.  DRAINS:  5-French left ureteral catheter and 14-French Foley.  COMPLICATIONS:  None.  INDICATIONS:  [name] is a 56 year old white male with sigmoid diverticulitis.  He is to undergo surgical exploration today.  Left ureteral catheter was requested for aid in ureteral identification.  FINDINGS OF PROCEDURE:  The patient had been given antibiotics per Dr. Dwain Sarna.  He was taken to the operating room, where general anesthetic was induced.  He was placed in the lithotomy position.  His genitalia was prepped with Hibiclens.  He was draped in the usual sterile fashion.  Cystoscopy was performed using a 22-French scope and 12-degree lens. Examination revealed a normal urethra.  The external sphincter was intact.  The prostatic urethra was short with minimal bilobar hyperplasia and a slightly high bladder neck without significant obstruction.  Examination of bladder revealed mild trabeculation.  There were 2 small hemorrhagic lesions on the posterior wall of the bladder. Most consistent with catheter irritation, there was nothing to suggest tumor or stone.  The ureteral orifices were unremarkable.  The left ureteral orifice was cannulated with a 5-French open-ended catheter, which was advanced easily to the kidney.  The scope was then removed, leaving the stent, the catheter in place.  A 14-French Foley catheter was then inserted alongside the  ureteral catheter.  The ureteral catheter was secured to the Foley with silk tie and ureteral catheter and Foley were secured to drainage via a Goldberg device.  The patient was then taken out of lithotomy position.  After removal the drapes, he was then re-prepped and Dr. Dwain Sarna assumed the care of the patient at this point.  The stent will be removed at the end of the case.     Excell Seltzer. Annabell Howells, M.D.     JJW/MEDQ  D:  12/11/2011  T:  12/12/2011  Job:  [id number]

## 2011-12-12 NOTE — Progress Notes (Signed)
1 Day Post-Op  Subjective: Feels better than he did preop, pain controlled  Objective: Vital signs in last 24 hours: Temp:  [97.2 F (36.2 C)-98.9 F (37.2 C)] 98.2 F (36.8 C) (02/20 0400) Pulse Rate:  [71-114] 85  (02/20 0600) Resp:  [7-24] 16  (02/20 0600) BP: (95-202)/(54-113) 138/70 mmHg (02/20 0500) SpO2:  [98 %-100 %] 100 % (02/20 0600) FiO2 (%):  [2 %-10 %] 2 % (02/19 2127) Weight:  [114 lb 6.7 oz (51.9 kg)-119 lb 11.4 oz (54.3 kg)] 119 lb 11.4 oz (54.3 kg) (02/20 0400) Last BM Date: 12/10/11  Intake/Output from previous day: 02/19 0701 - 02/20 0700 In: 6972.5 [I.V.:5230; IV Piggyback:550; TPN:552.5] Out: 1640 [Urine:305; Emesis/NG output:250; Drains:435; Blood:650] Intake/Output this shift:    General appearance: no distress Resp: diminished breath sounds bibasilar Cardio: RRR GI: appropr tender, expected drainage, stoma pink, g tube draining  Lab Results:   Basename 12/12/11 0545 12/12/11 0040  WBC 20.9* 26.2*  HGB 11.6* 11.5*  HCT 35.0* 34.5*  PLT 412* 499*   BMET  Basename 12/12/11 0545 12/12/11 0040  NA 131* 127*  K 5.6* 5.4*  CL 98 97  CO2 30 29  GLUCOSE 157* 178*  BUN 20 21  CREATININE 0.87 0.94  CALCIUM 7.7* 7.7*   Anti-infectives:  Assessment/Plan: POD #1 Hartmanns, sbr x2 , g tube 1. Neuro- continue pca 2. CV- stable 3. Pulm- aggressive pulm toilet 4. GI- expected prolonged ileus given duration of bowel obstruction before I operated on him, condition of bowel, will continue tna, can dc ng tube today and leave g to drain 5. Renal- need to recheck k later, cr normal, change in tna per pharmacy 6. Heme- hct fine 7. ID- wbc up as expected postop, continue invanz 8. SCDs start heparin today 9. Leave foley in until tomorrow 10. Remain in stepdown until tomorrow   LOS: 18 days    [facility] 12/12/2011

## 2011-12-12 NOTE — Progress Notes (Signed)
Dr Johna Sheriff aware of repeat K level of 4.9. No further orders.

## 2011-12-12 NOTE — Progress Notes (Signed)
PARENTERAL NUTRITION CONSULT NOTE - Follow-Up  Pharmacy Consult for TNA Indication: Diverticulitis w/abscess, partial SBO, colectomy, colostomy  Allergies  Allergen Reactions  . Codeine Rash    Patient Measurements: Height: 5\' 9"  (175.3 cm) Weight: 119 lb 11.4 oz (54.3 kg) IBW/kg (Calculated) : 70.7   Vital Signs: Temp: 98.2 F (36.8 C) (02/20 0400) Temp src: Oral (02/20 0400) BP: 138/70 mmHg (02/20 0500) Pulse Rate: 85  (02/20 0600)  Intake/Output from previous day:   6972.02/1639  (NG output ) Net + 5.3L  Labs:  [facility] 12/12/11 0545 12/12/11 0040 12/11/11 1354 12/11/11 0550  WBC 20.9* 26.2* -- 12.9*  HGB 11.6* 11.5* 11.9* --  HCT 35.0* 34.5* 35.0* --  PLT 412* 499* -- 479*  APTT -- -- -- --  INR -- -- -- --     Basename 12/12/11 0545 12/12/11 0040 12/11/11 1354 12/11/11 0550 12/10/11 1115 12/10/11 0430  NA 131* 127* 130* -- -- --  K 5.6* 5.4* 4.3 -- -- --  CL 98 97 -- 94* -- --  CO2 30 29 -- 31 -- --  GLUCOSE 157* 178* 204* -- -- --  BUN 20 21 -- 14 -- --  CREATININE 0.87 0.94 -- 0.75 -- --  LABCREA -- -- -- -- -- --  CREAT24HRUR -- -- -- -- -- --  CALCIUM 7.7* 7.7* -- 8.7 -- --  MG -- -- -- -- -- 1.9  PHOS -- -- -- -- -- 4.0  PROT 5.2* -- -- -- -- 6.9  ALBUMIN 1.8* -- -- -- -- 2.3*  AST 31 -- -- -- -- 24  ALT 12 -- -- -- -- 10  ALKPHOS 45 -- -- -- -- 54  BILITOT 0.4 -- -- -- -- 0.4  BILIDIR -- -- -- -- -- --  IBILI -- -- -- -- -- --  PREALBUMIN -- -- -- -- 14.6* 13.8*  TRIG -- -- -- -- -- 47  CHOLHDL -- -- -- -- -- --  CHOL -- -- -- -- -- 56  Corrected Ca 9.5  Estimated Creatinine Clearance: 73.7 ml/min (by C-G formula based on Cr of 0.87).   CBGs: 115-134  Medications:  Scheduled:     . ertapenem  1 g Intravenous Q24H  . heparin subcutaneous  5,000 Units Subcutaneous Q8H  . HYDROmorphone PCA 0.3 mg/mL   Intravenous Q4H  . lip balm  1 application Topical BID  . metoprolol  2 mg Intravenous Once  . metoprolol  2 mg Intravenous Once   . pantoprazole (PROTONIX) IV  40 mg Intravenous QHS  . DISCONTD: bisacodyl  10 mg Rectal Daily  . DISCONTD: fluconazole (DIFLUCAN) IV  400 mg Intravenous Daily   Infusions:     . sodium chloride 125 mL/hr at 12/12/11 0645  . TPN (CLINIMIX) +/- additives Stopped (12/11/11 1026)   And  . fat emulsion Stopped (12/11/11 1026)  . TPN (CLINIMIX) +/- additives 75 mL/hr at 12/11/11 2200  . DISCONTD: 0.9 % NaCl with KCl 20 mEq / L    . DISCONTD: dextrose 75 mL/hr at 12/04/11 0252  . DISCONTD: lactated ringers 125 mL/hr at 12/11/11 2000  . DISCONTD: lactated ringers Stopped (12/11/11 2248)    Insulin Requirements in previous 24 hours:  Sliding scale insulin was DCd 12/09/11  Current Nutrition:   Clinimix 5/20 at 87mL/hr  Clear liquid diet as "sham feeding" started 2/16.   Nutritional Goals:  Per RD assessment 11/28/11:  Kcal:1800-2050  Protein:80-95g  TNA Clinimix 5/20 at goal rate of  39ml/hr plus Lipids on MWF to provide 90g/d protein and 2064 Kcal/d on lipid days, 1584 Kcal/d on non-lipid days (avg 1790 KCal/d over a week).  Assessment:  56 y/o M with diverticulitis/abscess and superimposed SBO.    Prealbumin slowly improving 11.2->13.8->14.6  LFTs, Triglycerides, CBGs acceptable.  Tolerating TNA at goal rate.  POD #1 for sigmoid colectomy, colostomy   Hyperkalemia, K 5.6, appears to have developed yesterday, IVF with KCl have been d/c  Hyponatremia, Na 131, which is slightly improved from yesterday  Plan:   Change to non-electrolyte formula Clinimix 5/15 at rate of 80 ml/hr (to provide 96g protein, and 1843 kcal with lipids)  Lipids 20% at 48ml/hr on MWF only due to ongoing Sport and exercise psychologist.  Multivitamins and trace elements on MWF only due to ongoing Sport and exercise psychologist.  Continue thiamine 100mg  daily in each 2L TNA.  Re-assess potassium and electrolytes tomorrow with TNA labs.    Lynann Beaver PharmD  Pager [phone] 12/12/2011 8:54 AM

## 2011-12-13 LAB — CBC
HCT: 24.2 % — ABNORMAL LOW (ref 39.0–52.0)
Hemoglobin: 8.2 g/dL — ABNORMAL LOW (ref 13.0–17.0)
MCH: 30.3 pg (ref 26.0–34.0)
MCV: 89.3 fL (ref 78.0–100.0)
Platelets: 285 10*3/uL (ref 150–400)
RBC: 2.71 MIL/uL — ABNORMAL LOW (ref 4.22–5.81)
WBC: 15.8 10*3/uL — ABNORMAL HIGH (ref 4.0–10.5)

## 2011-12-13 LAB — GLUCOSE, CAPILLARY

## 2011-12-13 LAB — BASIC METABOLIC PANEL
CO2: 32 mEq/L (ref 19–32)
Chloride: 95 mEq/L — ABNORMAL LOW (ref 96–112)
Creatinine, Ser: 0.59 mg/dL (ref 0.50–1.35)
Glucose, Bld: 131 mg/dL — ABNORMAL HIGH (ref 70–99)

## 2011-12-13 MED ORDER — HYDROMORPHONE 0.3 MG/ML IV SOLN
INTRAVENOUS | Status: AC
  Filled 2011-12-13: qty 25

## 2011-12-13 MED ORDER — HYDROMORPHONE HCL PF 1 MG/ML IJ SOLN
INTRAMUSCULAR | Status: AC
  Filled 2011-12-13: qty 1

## 2011-12-13 MED ORDER — THIAMINE HCL 100 MG/ML IJ SOLN
INTRAMUSCULAR | Status: AC
  Administered 2011-12-13: 18:00:00 via INTRAVENOUS
  Filled 2011-12-13: qty 2000

## 2011-12-13 MED ORDER — MIDAZOLAM HCL 10 MG/2ML IJ SOLN
INTRAMUSCULAR | Status: AC
  Filled 2011-12-13: qty 2

## 2011-12-13 MED ORDER — FENTANYL CITRATE 0.05 MG/ML IJ SOLN
INTRAMUSCULAR | Status: AC
  Filled 2011-12-13: qty 2

## 2011-12-13 MED ORDER — ALTEPLASE 2 MG IJ SOLR
2.0000 mg | Freq: Once | INTRAMUSCULAR | Status: DC
  Filled 2011-12-13: qty 2

## 2011-12-13 MED ORDER — SODIUM CHLORIDE 0.9 % IJ SOLN
10.0000 mL | INTRAMUSCULAR | Status: DC | PRN
  Administered 2011-12-13 – 2011-12-24 (×5): 10 mL

## 2011-12-13 MED ORDER — ALTEPLASE 100 MG IV SOLR
2.0000 mg | Freq: Once | INTRAVENOUS | Status: AC
  Administered 2011-12-13: 2 mg
  Filled 2011-12-13: qty 2

## 2011-12-13 MED ORDER — HYDROMORPHONE HCL PF 1 MG/ML IJ SOLN
1.0000 mg | Freq: Once | INTRAMUSCULAR | Status: AC
  Administered 2011-12-13: 1 mg via INTRAVENOUS

## 2011-12-13 NOTE — Progress Notes (Signed)
PARENTERAL NUTRITION CONSULT NOTE - Follow-Up  Pharmacy Consult for TNA Indication: Diverticulitis w/abscess, partial SBO, s/p colectomy/colostomy  Allergies  Allergen Reactions  . Codeine Rash    Patient Measurements: Height: 5\' 9"  (175.3 cm) Weight: 121 lb 14.6 oz (55.3 kg) IBW/kg (Calculated) : 70.7   Vital Signs: Temp: 98.4 F (36.9 C) (02/21 0402) Temp src: Oral (02/21 0402) BP: 142/66 mmHg (02/21 0402) Pulse Rate: 87  (02/21 0402)  Intake/Output from previous day:   2815.4 / 1875  (Net + )  Labs:  Basename 12/13/11 0400 12/12/11 0545 12/12/11 0040  WBC 15.8* 20.9* 26.2*  HGB 8.2* 11.6* 11.5*  HCT 24.2* 35.0* 34.5*  PLT 285 412* 499*  APTT -- -- --  INR -- -- --     Basename 12/13/11 0400 12/12/11 1733 12/12/11 0545 12/10/11 1115  NA 129* 129* 131* --  K 3.9 4.9 5.6* --  CL 95* 95* 98 --  CO2 32 32 30 --  GLUCOSE 131* 121* 157* --  BUN 14 18 20  --  CREATININE 0.59 0.76 0.87 --  LABCREA -- -- -- --  CREAT24HRUR -- -- -- --  CALCIUM 7.5* 7.9* 7.7* --  MG -- -- -- --  PHOS -- -- -- --  PROT -- -- 5.2* --  ALBUMIN -- -- 1.8* --  AST -- -- 31 --  ALT -- -- 12 --  ALKPHOS -- -- 45 --  BILITOT -- -- 0.4 --  BILIDIR -- -- -- --  IBILI -- -- -- --  PREALBUMIN -- -- -- 14.6*  TRIG -- -- -- --  CHOLHDL -- -- -- --  CHOL -- -- -- --  Corrected Ca 9.3  Estimated Creatinine Clearance: 81.6 ml/min (by C-G formula based on Cr of 0.59).   CBGs: 128-159  Medications:  Scheduled:     . ertapenem  1 g Intravenous Q24H  . heparin subcutaneous  5,000 Units Subcutaneous Q8H  . HYDROmorphone PCA 0.3 mg/mL   Intravenous Q4H  . lip balm  1 application Topical BID  . pantoprazole (PROTONIX) IV  40 mg Intravenous QHS  . DISCONTD: fluconazole (DIFLUCAN) IV  400 mg Intravenous Daily   Infusions:     . sodium chloride 125 mL/hr at 12/13/11 0600  . TPN (CLINIMIX) +/- additives 80 mL/hr at 12/12/11 2000   And  . fat emulsion 250 mL (12/12/11 2000)  . TPN  (CLINIMIX) +/- additives 75 mL/hr at 12/11/11 2200    Insulin Requirements in previous 24 hours:  Sliding scale insulin was DCd 12/09/11  Current Nutrition:   Clinimix 5/15 at 80 mL/hr  Clear liquid diet as "sham feeding" started 2/16.   Nutritional Goals:  Per RD assessment 11/28/11:  Kcal:1800-2050  Protein:80-95g  TNA Clinimix 5/20 at goal rate of 49ml/hr plus Lipids on MWF to provide 90g/d protein and 2064 Kcal/d on lipid days, 1584 Kcal/d on non-lipid days (avg 1790 KCal/d over a week).  Assessment:  56 y/o M with diverticulitis/abscess and superimposed SBO.    POD #2 for sigmoid colectomy, colostomy   Tolerating TNA at goal rate.  Hyperkalemia resolved, Hyponatremia ongoing  Plan:   Change back to electrolyte formula with Clinimix E 5/20 at goal rate of 75 ml/hr  Lipids 20% at 68ml/hr on MWF only due to ongoing Sport and exercise psychologist.  Multivitamins and trace elements on MWF only due to ongoing Sport and exercise psychologist.  Continue thiamine 100mg  daily in each 2L TNA.  Follow TNA labs as ordered    Lynann Beaver  PharmD  Pager [phone] 12/13/2011 7:09 AM

## 2011-12-13 NOTE — Progress Notes (Signed)
Agree with above, expect prolonged ileus, ostomy pink wound clean, continue foley for 3 days postop due to surgery and nature of abscess near bladder

## 2011-12-13 NOTE — Progress Notes (Signed)
Patient ID: Zachary Walls, male   DOB: 1955/11/21, 56 y.o.   MRN: [id number] 2 Days Post-Op  Subjective: Pt c/o pain with moving.  Minimal nausea, has the urge to have a BM from his rectum.  Objective: Vital signs in last 24 hours: Temp:  [97.3 F (36.3 C)-99.8 F (37.7 C)] 98.4 F (36.9 C) (02/21 0402) Pulse Rate:  [85-101] 87  (02/21 0402) Resp:  [7-18] 16  (02/21 0407) BP: (105-146)/(51-80) 142/66 mmHg (02/21 0402) SpO2:  [92 %-100 %] 100 % (02/21 0407) FiO2 (%):  [100 %] 100 % (02/20 1124) Weight:  [121 lb 14.6 oz (55.3 kg)] 121 lb 14.6 oz (55.3 kg) (02/21 0402) Last BM Date: 12/10/11  Intake/Output from previous day: 02/20 0701 - 02/21 0700 In: 2815.4 [I.V.:2090.4; IV Piggyback:50; TPN:675] Out: 1875 [Urine:1425; Drains:450] Intake/Output this shift:    PE: Abd: soft, -BS, ND, incision is clean. Packing removed.  No active bleeding in the wound.  Fascia is intact.  Packing replaced.  JPs both with serosang output.  No frank bloody output.  g tube to gravity drainage with bilious output.  Ostomy with no output, stoma pink and viable GU: foley in place with yellow colored urine.  Lab Results:   Basename 12/13/11 0400 12/12/11 0545  WBC 15.8* 20.9*  HGB 8.2* 11.6*  HCT 24.2* 35.0*  PLT 285 412*   BMET  Basename 12/13/11 0400 12/12/11 1733  NA 129* 129*  K 3.9 4.9  CL 95* 95*  CO2 32 32  GLUCOSE 131* 121*  BUN 14 18  CREATININE 0.59 0.76  CALCIUM 7.5* 7.9*   PT/INR No results found for this basename: LABPROT:2,INR:2 in the last 72 hours   Studies/Results: No results found.  Anti-infectives: Anti-infectives     Start     Dose/Rate Route Frequency Ordered Stop   12/11/11 0900   ertapenem (INVANZ) 1 g in sodium chloride 0.9 % 50 mL IVPB        1 g 100 mL/hr over 30 Minutes Intravenous Every 24 hours 12/11/11 0729     12/01/11 1100   fluconazole (DIFLUCAN) IVPB 400 mg  Status:  Discontinued        400 mg 200 mL/hr over 60 Minutes Intravenous Daily 12/01/11  0937 12/12/11 0755   11/24/11 1800   metroNIDAZOLE (FLAGYL) IVPB 500 mg  Status:  Discontinued        500 mg 100 mL/hr over 60 Minutes Intravenous Every 8 hours 11/24/11 1506 12/11/11 0729   11/24/11 1509   ciprofloxacin (CIPRO) IVPB 400 mg  Status:  Discontinued        400 mg 200 mL/hr over 60 Minutes Intravenous Every 12 hours 11/24/11 1506 12/11/11 0729   11/24/11 1000   ciprofloxacin (CIPRO) IVPB 400 mg        400 mg 200 mL/hr over 60 Minutes Intravenous  Once 11/24/11 0950 11/24/11 1106   11/24/11 1000   metroNIDAZOLE (FLAGYL) IVPB 500 mg        500 mg 100 mL/hr over 60 Minutes Intravenous  Once 11/24/11 0950 11/24/11 1212   11/24/11 0815   cefTRIAXone (ROCEPHIN) 1 g in dextrose 5 % 50 mL IVPB        1 g 100 mL/hr over 30 Minutes Intravenous  Once 11/24/11 0800 11/24/11 0852           Assessment/Plan  1. Diverticulitis with multiple intra-abdominal abscesses and SBO 2. S/p ex lap with hartmanns, SBR x2, placement of g-tube 3. Post-op ileus 4.  ABL anemia 5. Hyperkalemia, resolved  Plan: 1. Due to drop in h/h, will recheck around 12 noon today and follow 2. Repeat labs in the morning. 3. WOC, RN consult for ostomy teaching 4. Continue G-tube to gravity due to ileus, only 1 cup of ice per shift 5. OOB and mobilize today 6. Continue IV abx therapy 7. Begin NS WD dressing changes today, first done by me.  LOS: 19 days    Zachary Walls E 12/13/2011

## 2011-12-14 ENCOUNTER — Inpatient Hospital Stay (HOSPITAL_COMMUNITY): Payer: Medicaid Other

## 2011-12-14 LAB — BASIC METABOLIC PANEL
BUN: 11 mg/dL (ref 6–23)
CO2: 32 mEq/L (ref 19–32)
Calcium: 7.3 mg/dL — ABNORMAL LOW (ref 8.4–10.5)
Chloride: 93 mEq/L — ABNORMAL LOW (ref 96–112)
Creatinine, Ser: 0.6 mg/dL (ref 0.50–1.35)

## 2011-12-14 LAB — GLUCOSE, CAPILLARY: Glucose-Capillary: 112 mg/dL — ABNORMAL HIGH (ref 70–99)

## 2011-12-14 LAB — CBC: Hemoglobin: 7.9 g/dL — ABNORMAL LOW (ref 13.0–17.0)

## 2011-12-14 MED ORDER — SODIUM CHLORIDE 0.9 % IJ SOLN
INTRAMUSCULAR | Status: AC
  Filled 2011-12-14: qty 3

## 2011-12-14 MED ORDER — HYDROMORPHONE 0.3 MG/ML IV SOLN
INTRAVENOUS | Status: AC
  Administered 2011-12-14: 0.3 mg
  Filled 2011-12-14: qty 25

## 2011-12-14 MED ORDER — TRACE MINERALS CR-CU-MN-SE-ZN 10-1000-500-60 MCG/ML IV SOLN
INTRAVENOUS | Status: AC
  Administered 2011-12-14 (×2): via INTRAVENOUS
  Filled 2011-12-14: qty 2000

## 2011-12-14 MED ORDER — FAT EMULSION 20 % IV EMUL
240.0000 mL | INTRAVENOUS | Status: AC
  Administered 2011-12-14: 240 mL via INTRAVENOUS
  Filled 2011-12-14: qty 250

## 2011-12-14 NOTE — Progress Notes (Signed)
Pt has been educated regarding correct use of Incentive Spirometer (IS). Pt verbalizes understanding of correct use. Pt states that there is no further questions regarding correct use of the IS. Aleka Twitty Lorraine Pascual Mantel, RN    

## 2011-12-14 NOTE — Progress Notes (Signed)
PICC checked by 2 PICC nurses - noted good blood return with easy flush both lumens. Dsg changed. PICC not exchanged at this time. VSandritter RN/VABC

## 2011-12-14 NOTE — Progress Notes (Signed)
PARENTERAL NUTRITION CONSULT NOTE - Follow-Up  Pharmacy Consult for TNA Indication: Diverticulitis w/abscess, partial SBO, s/p colectomy/colostomy  Allergies  Allergen Reactions  . Codeine Rash    Patient Measurements: Height: 5\' 9"  (175.3 cm) Weight: 122 lb 5.7 oz (55.5 kg) IBW/kg (Calculated) : 70.7   Vital Signs: Temp: 98.5 F (36.9 C) (02/22 0403) Temp src: Oral (02/22 0403) BP: 109/53 mmHg (02/22 0340) Pulse Rate: 89  (02/22 0340)  Intake/Output from previous day:   2815.4 / 1875  (Net + )  Labs:  Basename 12/14/11 0315 12/13/11 1141 12/13/11 0400 12/12/11 0545  WBC 9.6 -- 15.8* 20.9*  HGB 7.9* 8.4* 8.2* --  HCT 22.8* 24.7* 24.2* --  PLT 274 -- 285 412*  APTT -- -- -- --  INR -- -- -- --     Basename 12/14/11 0315 12/13/11 0400 12/12/11 1733 12/12/11 0545  NA 128* 129* 129* --  K 3.5 3.9 4.9 --  CL 93* 95* 95* --  CO2 32 32 32 --  GLUCOSE 119* 131* 121* --  BUN 11 14 18  --  CREATININE 0.60 0.59 0.76 --  LABCREA -- -- -- --  CREAT24HRUR -- -- -- --  CALCIUM 7.3* 7.5* 7.9* --  MG -- -- -- --  PHOS -- -- -- --  PROT -- -- -- 5.2*  ALBUMIN -- -- -- 1.8*  AST -- -- -- 31  ALT -- -- -- 12  ALKPHOS -- -- -- 45  BILITOT -- -- -- 0.4  BILIDIR -- -- -- --  IBILI -- -- -- --  PREALBUMIN -- -- -- --  TRIG -- -- -- --  CHOLHDL -- -- -- --  CHOL -- -- -- --  Corrected Ca 9.1  Estimated Creatinine Clearance: 81.9 ml/min (by C-G formula based on Cr of 0.6).   CBGs: 101-123  Medications:  Scheduled:     . alteplase  2 mg Intracatheter Once  . alteplase  2 mg Intracatheter Once  . ertapenem  1 g Intravenous Q24H  . heparin subcutaneous  5,000 Units Subcutaneous Q8H  . HYDROmorphone      .  HYDROmorphone (DILAUDID) injection  1 mg Intravenous Once  . HYDROmorphone PCA 0.3 mg/mL   Intravenous Q4H  . HYDROmorphone PCA 0.3 mg/mL      . lip balm  1 application Topical BID  . pantoprazole (PROTONIX) IV  40 mg Intravenous QHS  . sodium chloride         Infusions:     . sodium chloride 125 mL/hr at 12/14/11 0150  . TPN (CLINIMIX) +/- additives 80 mL/hr at 12/12/11 2000   And  . fat emulsion 250 mL (12/12/11 2000)  . TPN [facility]) +/- additives 75 mL/hr at 12/13/11 1749    Insulin Requirements in previous 24 hours:  Sliding scale insulin was DCd 12/09/11  Current Nutrition:   Clinimix E 5/20@75ml /hr  NPO except ice chips started 2/21 <-- CL (2/16)  Nutritional Goals:  Per RD assessment 11/28/11:  Kcal:1800-2050  Protein:80-95g  TNA Clinimix 5/20 at goal rate of 53ml/hr plus Lipids on MWF to provide 90g/d protein and 2064 Kcal/d on lipid days, 1584 Kcal/d on non-lipid days (avg 1790 KCal/d over a week).  Assessment:  56 y/o M with diverticulitis/abscess and superimposed SBO.    POD #3 for sigmoid colectomy, colostomy, small bowel resx x2 w/primary anastamosis.   Post op ileus, NG tube out, G tube to gravity drain  Tolerating TNA at goal rate.  Hyponatremia ongoing, hypochloremia.  Other lytes ok. CBG ok.   Plan:   Continue Clinimix E 5/20 at goal rate of 75 ml/hr  Lipids 20% at 67ml/hr on MWF only due to ongoing Sport and exercise psychologist.  Multivitamins and trace elements on MWF only due to ongoing Sport and exercise psychologist.  Continue thiamine 100mg  daily in each 2L TNA.  Follow TNA labs as ordered  Gwen Her PharmD  [phone] 12/14/2011 8:44 AM

## 2011-12-14 NOTE — Progress Notes (Signed)
Agree with above.  He has expected ileus and I think this will be prolonged as well as he is at high risk of intraabdominal complications due to condition at time of surgery.  His wounds all look fine, continue tna for now.  Needs to be up as much as possible.  7 days of antibiotics as long as no other complications

## 2011-12-14 NOTE — Progress Notes (Signed)
Patient ID: ODYN TURKO, male   DOB: 06/09/1956, 56 y.o.   MRN: [id number] 3 Days Post-Op  Subjective: Pt c/o pain.  No flatus in bag.  No nausea.  Having difficulty moving around secondary to pain.  Objective: Vital signs in last 24 hours: Temp:  [98 F (36.7 C)-99.2 F (37.3 C)] 98.5 F (36.9 C) (02/22 0403) Pulse Rate:  [85-89] 89  (02/22 0340) Resp:  [12-17] 17  (02/22 0400) BP: (109-130)/(53-69) 109/53 mmHg (02/22 0340) SpO2:  [95 %-100 %] 97 % (02/22 0400) FiO2 (%):  [2 %] 2 % (02/21 0800) Weight:  [122 lb 5.7 oz (55.5 kg)] 122 lb 5.7 oz (55.5 kg) (02/22 0403) Last BM Date: 12/13/11  Intake/Output from previous day: 02/21 0701 - 02/22 0700 In: 6423.1 [P.O.:240; I.V.:2753.1; IV Piggyback:50; TPN:1830] Out: 922 [Urine:400; Drains:522] Intake/Output this shift:    PE: Abd: soft, tender, -BS, ND, incisional wound is clean.  Ostomy without any output. Both JPs with serosang output.  g tube in place to gravity drainage Heart: regular Lungs: CTAB  Lab Results:   Basename 12/14/11 0315 12/13/11 1141 12/13/11 0400  WBC 9.6 -- 15.8*  HGB 7.9* 8.4* --  HCT 22.8* 24.7* --  PLT 274 -- 285   BMET  Basename 12/14/11 0315 12/13/11 0400  NA 128* 129*  K 3.5 3.9  CL 93* 95*  CO2 32 32  GLUCOSE 119* 131*  BUN 11 14  CREATININE 0.60 0.59  CALCIUM 7.3* 7.5*   PT/INR No results found for this basename: LABPROT:2,INR:2 in the last 72 hours   Studies/Results: No results found.  Anti-infectives: Anti-infectives     Start     Dose/Rate Route Frequency Ordered Stop   12/11/11 0900   ertapenem (INVANZ) 1 g in sodium chloride 0.9 % 50 mL IVPB        1 g 100 mL/hr over 30 Minutes Intravenous Every 24 hours 12/11/11 0729     12/01/11 1100   fluconazole (DIFLUCAN) IVPB 400 mg  Status:  Discontinued        400 mg 200 mL/hr over 60 Minutes Intravenous Daily 12/01/11 0937 12/12/11 0755   11/24/11 1800   metroNIDAZOLE (FLAGYL) IVPB 500 mg  Status:  Discontinued        500  mg 100 mL/hr over 60 Minutes Intravenous Every 8 hours 11/24/11 1506 12/11/11 0729   11/24/11 1509   ciprofloxacin (CIPRO) IVPB 400 mg  Status:  Discontinued        400 mg 200 mL/hr over 60 Minutes Intravenous Every 12 hours 11/24/11 1506 12/11/11 0729   11/24/11 1000   ciprofloxacin (CIPRO) IVPB 400 mg        400 mg 200 mL/hr over 60 Minutes Intravenous  Once 11/24/11 0950 11/24/11 1106   11/24/11 1000   metroNIDAZOLE (FLAGYL) IVPB 500 mg        500 mg 100 mL/hr over 60 Minutes Intravenous  Once 11/24/11 0950 11/24/11 1212   11/24/11 0815   cefTRIAXone (ROCEPHIN) 1 g in dextrose 5 % 50 mL IVPB        1 g 100 mL/hr over 30 Minutes Intravenous  Once 11/24/11 0800 11/24/11 0852           Assessment/Plan  1. Procedures Ex lap with LOA, Hartmann's procedure, SBR x2 with placement of g tube 2. Post op ileus 3. ABL anemia  Plan: 1. Will transfuse a unit of PRBCs today. 2. Will transfer to a floor today 3. D/c foley today 4.  Cont to mobilize 5. Await bowel function 6. Check labs in the morning 7. Hold heparin for now, resume once hgb stable   LOS: 20 days    Mell Mellott E 12/14/2011

## 2011-12-15 LAB — BASIC METABOLIC PANEL
BUN: 9 mg/dL (ref 6–23)
Chloride: 93 mEq/L — ABNORMAL LOW (ref 96–112)
GFR calc Af Amer: >90 mL/min (ref >90)
Potassium: 3.3 mEq/L — ABNORMAL LOW (ref 3.5–5.1)

## 2011-12-15 LAB — CBC
HCT: 26 % — ABNORMAL LOW (ref 39.0–52.0)
Hemoglobin: 8.8 g/dL — ABNORMAL LOW (ref 13.0–17.0)
RBC: 3.05 MIL/uL — ABNORMAL LOW (ref 4.22–5.81)
RDW: 15.5 % (ref 11.5–15.5)
WBC: 8.4 10*3/uL (ref 4.0–10.5)

## 2011-12-15 LAB — GLUCOSE, CAPILLARY: Glucose-Capillary: 111 mg/dL — ABNORMAL HIGH (ref 70–99)

## 2011-12-15 MED ORDER — POTASSIUM CHLORIDE 10 MEQ/100ML IV SOLN
10.0000 meq | INTRAVENOUS | Status: DC
  Filled 2011-12-15 (×2): qty 100

## 2011-12-15 MED ORDER — POTASSIUM CHLORIDE 10 MEQ/100ML IV SOLN
10.0000 meq | INTRAVENOUS | Status: AC
Start: 2011-12-15 — End: 2011-12-15
  Administered 2011-12-15 (×4): 10 meq via INTRAVENOUS
  Filled 2011-12-15 (×4): qty 100

## 2011-12-15 MED ORDER — ALBUTEROL SULFATE (5 MG/ML) 0.5% IN NEBU
2.5000 mg | INHALATION_SOLUTION | Freq: Four times a day (QID) | RESPIRATORY_TRACT | Status: DC
  Administered 2011-12-16 – 2011-12-21 (×16): 2.5 mg via RESPIRATORY_TRACT
  Filled 2011-12-15 (×14): qty 0.5

## 2011-12-15 MED ORDER — CLINIMIX E/DEXTROSE (5/20) 5 % IV SOLN
INTRAVENOUS | Status: AC
  Administered 2011-12-15: 17:00:00 via INTRAVENOUS
  Filled 2011-12-15: qty 2000

## 2011-12-15 MED ORDER — HYDROMORPHONE 0.3 MG/ML IV SOLN
INTRAVENOUS | Status: AC
  Filled 2011-12-15: qty 25

## 2011-12-15 NOTE — Plan of Care (Signed)
Problem: Phase II Progression Outcomes Goal: Progress activity as tolerated unless otherwise ordered Outcome: Progressing Pt feeling tired. Refused to ambulate in hall this shift. Up to side of bed to dangle multiple times today.

## 2011-12-15 NOTE — Progress Notes (Signed)
Patient ID: Zachary Walls, male   DOB: 1955-12-25, 56 y.o.   MRN: [id number] 4 Days Post-Op  Subjective: No major complaints. Abdomen is sore .he feels he is a little better today.  Objective: Vital signs in last 24 hours: Temp:  [98.4 F (36.9 C)-100.1 F (37.8 C)] 99 F (37.2 C) (02/23 0609) Pulse Rate:  [84-94] 94  (02/23 0609) Resp:  [11-20] 18  (02/23 0609) BP: (109-132)/(61-78) 115/72 mmHg (02/23 0609) SpO2:  [91 %-100 %] 93 % (02/23 0609) Last BM Date: 12/13/11  Intake/Output from previous day: 02/22 0701 - 02/23 0700 In: 2910 [I.V.:1625; Blood:350; TPN:935] Out: 2720 [Urine:2425; Drains:295] Intake/Output this shift: Total I/O In: 100 [Other:100] Out: 300 [Urine:300]  General appearance: alert, cooperative and no distress GI: abnormal findings:  mild tenderness in the entire abdomen Incision/Wound:dressing in place, being changed by nursing. JP drains with serosanguineous drainage. Gastrostomy in place with bilious drainage. Stoma appears healthy without output.  Lab Results:   Basename 12/15/11 0600 12/14/11 0315  WBC 8.4 9.6  HGB 8.8* 7.9*  HCT 26.0* 22.8*  PLT 311 274   BMET  Basename 12/15/11 0600 12/14/11 0315  NA 129* 128*  K 3.3* 3.5  CL 93* 93*  CO2 33* 32  GLUCOSE 118* 119*  BUN 9 11  CREATININE 0.57 0.60  CALCIUM 7.4* 7.3*     Studies/Results: Dg Chest Port 1 View  12/14/2011  *RADIOLOGY REPORT*  Clinical Data: Evaluate PICC line placement  PORTABLE CHEST - 1 VIEW  Comparison: 05/12/2011  Findings: The right arm PICC line tip is in the distal third of the SVC.  The heart size is normal.  No pleural effusion or pulmonary edema.  No airspace consolidation identified.  IMPRESSION:  1.  The right arm PICC line tip is in the distal third of the SVC. 2.  No active cardiopulmonary abnormalities.  Original Report Authenticated By: Rosealee Albee, M.D.    Anti-infectives: Anti-infectives     Start     Dose/Rate Route Frequency Ordered Stop   12/11/11 0900   ertapenem (INVANZ) 1 g in sodium chloride 0.9 % 50 mL IVPB        1 g 100 mL/hr over 30 Minutes Intravenous Every 24 hours 12/11/11 0729     12/01/11 1100   fluconazole (DIFLUCAN) IVPB 400 mg  Status:  Discontinued        400 mg 200 mL/hr over 60 Minutes Intravenous Daily 12/01/11 0937 12/12/11 0755   11/24/11 1800   metroNIDAZOLE (FLAGYL) IVPB 500 mg  Status:  Discontinued        500 mg 100 mL/hr over 60 Minutes Intravenous Every 8 hours 11/24/11 1506 12/11/11 0729   11/24/11 1509   ciprofloxacin (CIPRO) IVPB 400 mg  Status:  Discontinued        400 mg 200 mL/hr over 60 Minutes Intravenous Every 12 hours 11/24/11 1506 12/11/11 0729   11/24/11 1000   ciprofloxacin (CIPRO) IVPB 400 mg        400 mg 200 mL/hr over 60 Minutes Intravenous  Once 11/24/11 0950 11/24/11 1106   11/24/11 1000   metroNIDAZOLE (FLAGYL) IVPB 500 mg        500 mg 100 mL/hr over 60 Minutes Intravenous  Once 11/24/11 0950 11/24/11 1212   11/24/11 0815   cefTRIAXone (ROCEPHIN) 1 g in dextrose 5 % 50 mL IVPB        1 g 100 mL/hr over 30 Minutes Intravenous  Once 11/24/11 0800 11/24/11 4098  Assessment/Plan: s/p Procedure(s): COLOSTOMY CYSTOSCOPY WITH STENT REPLACEMENT COLON RESECTION SIGMOID SMALL BOWEL RESECTION INCISION AND DRAINAGE ABSCESS GASTROSTOMY TUBE Stable postoperatively with expected ileus. Hypokalemia: Replace Continue TNA and G-tube drainage.   LOS: 21 days    Yarlin Breisch T 12/15/2011

## 2011-12-15 NOTE — Progress Notes (Signed)
PARENTERAL NUTRITION CONSULT NOTE - Follow-Up  Pharmacy Consult for TNA Indication: Diverticulitis w/abscess, partial SBO, s/p colectomy/colostomy  Allergies  Allergen Reactions  . Codeine Rash    Patient Measurements: Height: 5\' 9"  (175.3 cm) Weight: 122 lb 5.7 oz (55.5 kg) IBW/kg (Calculated) : 70.7   Vital Signs: Temp: 99 F (37.2 C) (02/23 0609) Temp src: Oral (02/23 0609) BP: 115/72 mmHg (02/23 0609) Pulse Rate: 94  (02/23 0609)  Intake/Output from previous day:   2910/2720 (+190)  Labs:  Basename 12/15/11 0600 12/14/11 0315 12/13/11 1141 12/13/11 0400  WBC 8.4 9.6 -- 15.8*  HGB 8.8* 7.9* 8.4* --  HCT 26.0* 22.8* 24.7* --  PLT 311 274 -- 285  APTT -- -- -- --  INR -- -- -- --     Basename 12/15/11 0600 12/14/11 0315 12/13/11 0400  NA 129* 128* 129*  K 3.3* 3.5 3.9  CL 93* 93* 95*  CO2 33* 32 32  GLUCOSE 118* 119* 131*  BUN 9 11 14   CREATININE 0.57 0.60 0.59  LABCREA -- -- --  CREAT24HRUR -- -- --  CALCIUM 7.4* 7.3* 7.5*  MG -- -- --  PHOS -- -- --  PROT -- -- --  ALBUMIN -- -- --  AST -- -- --  ALT -- -- --  ALKPHOS -- -- --  BILITOT -- -- --  BILIDIR -- -- --  IBILI -- -- --  PREALBUMIN -- -- --  TRIG -- -- --  CHOLHDL -- -- --  CHOL -- -- --  Corrected Ca 9.1  Estimated Creatinine Clearance: 81.9 ml/min (by C-G formula based on Cr of 0.57).   CBGs: 112-127  Medications:  Scheduled:     . alteplase  2 mg Intracatheter Once  . ertapenem  1 g Intravenous Q24H  . heparin subcutaneous  5,000 Units Subcutaneous Q8H  . HYDROmorphone PCA 0.3 mg/mL   Intravenous Q4H  . HYDROmorphone PCA 0.3 mg/mL      . HYDROmorphone PCA 0.3 mg/mL      . HYDROmorphone PCA 0.3 mg/mL      . lip balm  1 application Topical BID  . pantoprazole (PROTONIX) IV  40 mg Intravenous QHS  . potassium chloride  10 mEq Intravenous Q1 Hr x 4  . sodium chloride       Infusions:     . sodium chloride 125 mL/hr at 12/14/11 0150  . fat emulsion 240 mL (12/14/11 1747)   . TPN (CLINIMIX) +/- additives 75 mL/hr at 12/14/11 1748  . TPN (CLINIMIX) +/- additives 75 mL/hr at 12/13/11 1749    Insulin Requirements in previous 24 hours:  Sliding scale insulin was DCd 12/09/11  Current Nutrition:   Clinimix E 5/20@75ml /hr  NPO except ice chips started 2/21 <-- CL (2/16)  Nutritional Goals:  Per RD assessment 11/28/11:  Kcal:1800-2050  Protein:80-95g  TNA Clinimix 5/20 at goal rate of 74ml/hr plus Lipids on MWF to provide 90g/d protein and 2064 Kcal/d on lipid days, 1584 Kcal/d on non-lipid days (avg 1790 KCal/d over a week).  Assessment:  56 y/o M with diverticulitis/abscess and superimposed SBO.    POD #4 for sigmoid colectomy, colostomy, small bowel resx x2 w/primary anastamosis.   Post op ileus, NG tube out, G tube to gravity drain  Tolerating TNA at goal rate.  Hypokalemia, Hyponatremia ongoing, Low Cl, High CO2  CBG ok.   Plan:   Replace K+: KCL IVPB q1h x 4.  Continue Clinimix E 5/20 at goal rate of  75 ml/hr  Lipids 20% at 38ml/hr on MWF only due to ongoing Sport and exercise psychologist.  Multivitamins and trace elements on MWF only due to ongoing Sport and exercise psychologist.  Continue thiamine 100mg  daily in each 2L TNA.  Follow TNA labs as ordered  Gwen Her PharmD  [phone] 12/15/2011 7:40 AM

## 2011-12-16 LAB — BASIC METABOLIC PANEL
Calcium: 8 mg/dL — ABNORMAL LOW (ref 8.4–10.5)
GFR calc non Af Amer: >90 mL/min (ref >90)
Glucose, Bld: 122 mg/dL — ABNORMAL HIGH (ref 70–99)
Sodium: 133 mEq/L — ABNORMAL LOW (ref 135–145)

## 2011-12-16 LAB — GLUCOSE, CAPILLARY: Glucose-Capillary: 104 mg/dL — ABNORMAL HIGH (ref 70–99)

## 2011-12-16 MED ORDER — HYDROMORPHONE 0.3 MG/ML IV SOLN
INTRAVENOUS | Status: AC
  Administered 2011-12-17: 2.4 mg via INTRAVENOUS
  Filled 2011-12-16: qty 25

## 2011-12-16 MED ORDER — THIAMINE HCL 100 MG/ML IJ SOLN
INTRAVENOUS | Status: AC
  Administered 2011-12-16: 18:00:00 via INTRAVENOUS
  Filled 2011-12-16: qty 2000

## 2011-12-16 NOTE — Plan of Care (Signed)
Problem: Phase III Progression Outcomes Goal: Ambulating in hall BID/or as ordered Outcome: Completed/Met Date Met:  12/16/11 Up in hall with PT today.

## 2011-12-16 NOTE — Evaluation (Signed)
Physical Therapy Evaluation Patient Details Name: Zachary Walls MRN: [id number] DOB: 10-Nov-1955 Today's Date: 12/16/2011  Problem List:  Patient Active Problem List  Diagnoses  . Diverticulitis of large intestine with perforation/abscess  . COPD (chronic obstructive pulmonary disease)  . Hypertension  . SBO (small bowel obstruction)  . HOH (hard of hearing)    Past Medical History:  Past Medical History  Diagnosis Date  . COPD (chronic obstructive pulmonary disease)   . Pneumonia   . Hypertension   . Diverticulitis of large intestine with perforation/abscess 12/01/2011  . HOH (hard of hearing) 12/01/2011   Past Surgical History:  Past Surgical History  Procedure Date  . Hernia repair     inguinal    PT Assessment/Plan/Recommendation PT Assessment Clinical Impression Statement: Patient admitted with diverticulitis and abcess now s/p sigmoid colectomy with colostomy, cystoscopy with stent replacement and I&D with drains placed.  Presents with decreased activity tolerance, generalized weakness and significant acute pain,  Will benefit from skilled PT in the acute setting to maximize independence and safety with mobility to allow d/c home with intermittent family help vs. STSNF or LTACH.  Will update recommendations as patient progresses. PT Recommendation/Assessment: Patient will need skilled PT in the acute care venue PT Problem List: Decreased strength;Decreased activity tolerance;Pain;Decreased mobility PT Therapy Diagnosis : Difficulty walking;Acute pain;Generalized weakness PT Plan PT Frequency: Min 3X/week PT Treatment/Interventions: DME instruction;Gait training;Stair training;Functional mobility training;Therapeutic activities;Therapeutic exercise;Patient/family education PT Recommendation Follow Up Recommendations: Home health PT;Skilled nursing facility;LTACH (depending on progress, amount of family assist available.) Equipment Recommended: Rolling walker with 5" wheels PT  Goals  Acute Rehab PT Goals PT Goal Formulation: With patient Time For Goal Achievement: 7 days Pt will Roll Supine to Left Side: with modified independence PT Goal: Rolling Supine to Left Side - Progress: Goal set today Pt will go Supine/Side to Sit: with modified independence PT Goal: Supine/Side to Sit - Progress: Goal set today Pt will go Sit to Supine/Side: with modified independence PT Goal: Sit to Supine/Side - Progress: Goal set today Pt will go Sit to Stand: Independently PT Goal: Sit to Stand - Progress: Goal set today Pt will Ambulate: >150 feet;with modified independence;with least restrictive assistive device PT Goal: Ambulate - Progress: Goal set today Pt will Go Up / Down Stairs: 3-5 stairs;with rail(s) PT Goal: Up/Down Stairs - Progress: Goal set today Pt will Perform Home Exercise Program: Independently PT Goal: Perform Home Exercise Program - Progress: Goal set today  PT Evaluation Precautions/Restrictions  Precautions Precaution Comments: multiple lines and drains Prior Functioning  Home Living Lives With: Alone Type of Home: Mobile home Home Layout: One level Home Access: Stairs to enter Entrance Stairs-Rails: Left;Right Entrance Stairs-Number of Steps: 5 Home Adaptive Equipment: None Additional Comments: has nebulizer reports missing some valves for it Prior Function Level of Independence: Independent with basic ADLs;Independent with transfers;Independent with gait;Needs assistance with homemaking Cognition Cognition Arousal/Alertness: Awake/alert Overall Cognitive Status: Appears within functional limits for tasks assessed Cognition - Other Comments: very hard of hearing Sensation/Coordination Sensation Light Touch: Impaired Detail Light Touch Impaired Details: Impaired LLE Additional Comments: complalined initially of left leg numbness, improved with ambulation Extremity Assessment RLE Assessment RLE Assessment: Within Functional Limits (for  ambulation, not formally tested, noted muscle wasting) LLE Assessment LLE Assessment: Within Functional Limits (for ambulation, not formally tested, noted muscle wasting) Mobility (including Balance) Bed Mobility Bed Mobility: Yes Rolling Left: With rail;5: Supervision Rolling Left Details (indicate cue type and reason): assist with lines/drains Left Sidelying to  Sit: 5: Supervision;With rails;HOB flat Left Sidelying to Sit Details (indicate cue type and reason): heavy reliance on rails Sitting - Scoot to Edge of Bed: 6: Modified independent (Device/Increase time) Sit to Supine: 5: Supervision;With rail;HOB flat Sit to Supine - Details (indicate cue type and reason): cues for sit to left sidelying, but patient returned to supine Transfers Transfers: Yes Sit to Stand: 4: Min assist;With upper extremity assist;From bed Sit to Stand Details (indicate cue type and reason): minguard assist only for safety and due to multiple lines Stand to Sit: 5: Supervision;To bed;With upper extremity assist Stand to Sit Details: for safety Ambulation/Gait Ambulation/Gait: Yes Ambulation/Gait Assistance: 4: Min assist Ambulation/Gait Assistance Details (indicate cue type and reason): minguard assist.  patient first holding IV pole, then switched to rolling walker half way Ambulation Distance (Feet): 400 Feet Assistive device: Rolling walker Gait Pattern: Step-through pattern;Decreased stride length;Trunk flexed;Shuffle Gait velocity: slow pace (increasingly slower with increased distance))  Balance Balance Assessed: Yes Dynamic Standing Balance Dynamic Standing - Balance Support: Left upper extremity supported;During functional activity Dynamic Standing - Level of Assistance: 5: Stand by assistance Dynamic Standing - Balance Activities: Lateral lean/weight shifting;Forward lean/weight shifting Dynamic Standing - Comments: stood to comb hair and brush teeth with supervision, able to reach to retrieve and  place toiletries kit from shelf up to right x 2 reps Exercise    End of Session PT - End of Session Activity Tolerance: Patient limited by pain Patient left: in bed;with call bell in reach General Behavior During Session: [facility] for tasks performed Cognition: [facility] for tasks performed  [facility] 12/16/2011, 2:17 PM

## 2011-12-16 NOTE — Plan of Care (Signed)
Problem: Phase II Progression Outcomes Goal: Progress activity as tolerated unless otherwise ordered Outcome: Completed/Met Date Met:  12/16/11 Up in hall with PT today.

## 2011-12-16 NOTE — Progress Notes (Signed)
Patient ID: KEHINDE BOWDISH, male   DOB: [birth year], 56 y.o.   MRN: [id number] 5 Days Post-Op  Subjective: He has noticed he is a little confused at times but okay this morning. He has some throat and upper airway congestion but is coughing it up. Minimal nominal pain. States he feels a little hungry.  Objective: Vital signs in last 24 hours: Temp:  [97.4 F (36.3 C)-98.5 F (36.9 C)] 97.8 F (36.6 C) (02/24 0650) Pulse Rate:  [71-89] 71  (02/24 0650) Resp:  [16-18] 18  (02/24 0733) BP: (109-132)/(66-74) 116/71 mmHg (02/24 0650) SpO2:  [92 %-99 %] 98 % (02/24 0926) FiO2 (%):  [21 %] 21 % (02/23 2248) Last BM Date: 12/13/11  Intake/Output from previous day: 02/23 0701 - 02/24 0700 In: 4506.3 [P.O.:80; I.V.:2526.3; TPN:1740] Out: 4295 [Urine:3525; Drains:770] Intake/Output this shift: Total I/O In: -  Out: 200 [Urine:200]  General appearance: alert and no distress GI: normal findings: soft, non-tender.  Stoma looks fine with some slight liquid output but no stool. Incision/Wound: dressing clean and dry.  JP drains with serous output  Lab Results:   Basename 12/15/11 0600 12/14/11 0315  WBC 8.4 9.6  HGB 8.8* 7.9*  HCT 26.0* 22.8*  PLT 311 274   BMET  Basename 12/16/11 0533 12/15/11 0600  NA 133* 129*  K 3.6 3.3*  CL 96 93*  CO2 33* 33*  GLUCOSE 122* 118*  BUN 10 9  CREATININE 0.56 0.57  CALCIUM 8.0* 7.4*     Studies/Results: Dg Chest Port 1 View  12/14/2011  *RADIOLOGY REPORT*  Clinical Data: Evaluate PICC line placement  PORTABLE CHEST - 1 VIEW  Comparison: 05/12/2011  Findings: The right arm PICC line tip is in the distal third of the SVC.  The heart size is normal.  No pleural effusion or pulmonary edema.  No airspace consolidation identified.  IMPRESSION:  1.  The right arm PICC line tip is in the distal third of the SVC. 2.  No active cardiopulmonary abnormalities.  Original Report Authenticated By: Rosealee Albee, M.D.    Anti-infectives: Anti-infectives     Start     Dose/Rate Route Frequency Ordered Stop   12/11/11 0900   ertapenem (INVANZ) 1 g in sodium chloride 0.9 % 50 mL IVPB        1 g 100 mL/hr over 30 Minutes Intravenous Every 24 hours 12/11/11 0729     12/01/11 1100   fluconazole (DIFLUCAN) IVPB 400 mg  Status:  Discontinued        400 mg 200 mL/hr over 60 Minutes Intravenous Daily 12/01/11 0937 12/12/11 0755   11/24/11 1800   metroNIDAZOLE (FLAGYL) IVPB 500 mg  Status:  Discontinued        500 mg 100 mL/hr over 60 Minutes Intravenous Every 8 hours 11/24/11 1506 12/11/11 0729   11/24/11 1509   ciprofloxacin (CIPRO) IVPB 400 mg  Status:  Discontinued        400 mg 200 mL/hr over 60 Minutes Intravenous Every 12 hours 11/24/11 1506 12/11/11 0729   11/24/11 1000   ciprofloxacin (CIPRO) IVPB 400 mg        400 mg 200 mL/hr over 60 Minutes Intravenous  Once 11/24/11 0950 11/24/11 1106   11/24/11 1000   metroNIDAZOLE (FLAGYL) IVPB 500 mg        500 mg 100 mL/hr over 60 Minutes Intravenous  Once 11/24/11 0950 11/24/11 1212   11/24/11 0815   cefTRIAXone (ROCEPHIN) 1 g in dextrose 5 %  50 mL IVPB        1 g 100 mL/hr over 30 Minutes Intravenous  Once 11/24/11 0800 11/24/11 9604          Assessment/Plan: s/p Procedure(s): 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 His abdomen seems benign. No apparent complication. On IV antibiotics. Continue TNA, pulmonary toilet, mobilization.   LOS: 22 days    Jillyn Stacey T 12/16/2011  kj

## 2011-12-16 NOTE — Progress Notes (Signed)
PARENTERAL NUTRITION CONSULT NOTE - Follow-Up  Pharmacy Consult for TNA Indication: Diverticulitis w/abscess, partial SBO, s/p colectomy/colostomy  Allergies  Allergen Reactions  . Codeine Rash    Patient Measurements: Height: 5\' 9"  (175.3 cm) Weight: 122 lb 5.7 oz (55.5 kg) IBW/kg (Calculated) : 70.7   Vital Signs: Temp: 97.8 F (36.6 C) (02/24 0650) Temp src: Oral (02/24 0650) BP: 116/71 mmHg (02/24 0650) Pulse Rate: 71  (02/24 0650)  Intake/Output from previous day:   4506.12/4043 (+461.3)  Labs:  Basename 12/15/11 0600 12/14/11 0315 12/13/11 1141  WBC 8.4 9.6 --  HGB 8.8* 7.9* 8.4*  HCT 26.0* 22.8* 24.7*  PLT 311 274 --  APTT -- -- --  INR -- -- --     Basename 12/16/11 0533 12/15/11 0600 12/14/11 0315  NA 133* 129* 128*  K 3.6 3.3* 3.5  CL 96 93* 93*  CO2 33* 33* 32  GLUCOSE 122* 118* 119*  BUN 10 9 11   CREATININE 0.56 0.57 0.60  LABCREA -- -- --  CREAT24HRUR -- -- --  CALCIUM 8.0* 7.4* 7.3*  MG -- -- --  PHOS -- -- --  PROT -- -- --  ALBUMIN -- -- --  AST -- -- --  ALT -- -- --  ALKPHOS -- -- --  BILITOT -- -- --  BILIDIR -- -- --  IBILI -- -- --  PREALBUMIN -- -- --  TRIG -- -- --  CHOLHDL -- -- --  CHOL -- -- --  Corrected Ca 9.1  Estimated Creatinine Clearance: 81.9 ml/min (by C-G formula based on Cr of 0.56).   CBGs: 112-133  Medications:  Scheduled:     . albuterol  2.5 mg Nebulization Q6H WA  . alteplase  2 mg Intracatheter Once  . ertapenem  1 g Intravenous Q24H  . heparin subcutaneous  5,000 Units Subcutaneous Q8H  . HYDROmorphone PCA 0.3 mg/mL   Intravenous Q4H  . HYDROmorphone PCA 0.3 mg/mL      . lip balm  1 application Topical BID  . pantoprazole (PROTONIX) IV  40 mg Intravenous QHS  . potassium chloride  10 mEq Intravenous Q1 Hr x 4  . DISCONTD: potassium chloride  10 mEq Intravenous Q1 Hr x 2   Infusions:     . sodium chloride 50 mL/hr at 12/16/11 0013  . fat emulsion 240 mL (12/14/11 1747)  . TPN (CLINIMIX)  +/- additives 75 mL/hr at 12/14/11 1748  . TPN (CLINIMIX) +/- additives 75 mL/hr at 12/15/11 1718    Insulin Requirements in previous 24 hours:  Sliding scale insulin was DCd 12/09/11  Current Nutrition:   Clinimix E 5/20@75ml /hr  NPO except ice chips started 2/21 <-- CL (2/16)  Nutritional Goals:  Per RD assessment 11/28/11:  Kcal:1800-2050  Protein:80-95g  TNA Clinimix 5/20 at goal rate of 45ml/hr plus Lipids on MWF to provide 90g/d protein and 2064 Kcal/d on lipid days, 1584 Kcal/d on non-lipid days (avg 1790 KCal/d over a week).  Assessment:  56 y/o M with diverticulitis/abscess and superimposed SBO.    POD #5 for sigmoid colectomy, colostomy, small bowel resx x2 w/primary anastamosis.   Post op ileus, NG tube out, G tube to gravity drain  Tolerating TNA at goal rate.  MIVF @125ml /hr. Fluids were increased post op after TNA was already at goal rate  Hypokalemia corrected (KCl runs x 4 2/23), Hyponatremia ongoing (probably d/t +I/O), High CO2  CBG ok.   Plan:   Continue Clinimix E 5/20 at goal rate of 75  ml/hr  Decrease MIVF to 50 ml/hr  Lipids 20% at 55ml/hr on MWF only due to ongoing Sport and exercise psychologist.  Multivitamins and trace elements on MWF only due to ongoing Sport and exercise psychologist.  Continue thiamine 100mg  daily in each 2L TNA.  Follow TNA labs as ordered  Gwen Her PharmD  [phone] 12/16/2011 7:24 AM

## 2011-12-17 LAB — CBC
Hemoglobin: 9.2 g/dL — ABNORMAL LOW (ref 13.0–17.0)
MCH: 28.5 pg (ref 26.0–34.0)
MCV: 85.8 fL (ref 78.0–100.0)
Platelets: 388 10*3/uL (ref 150–400)
RBC: 3.23 MIL/uL — ABNORMAL LOW (ref 4.22–5.81)
WBC: 8.2 10*3/uL (ref 4.0–10.5)

## 2011-12-17 LAB — COMPREHENSIVE METABOLIC PANEL
ALT: 143 U/L — ABNORMAL HIGH (ref 0–53)
AST: 170 U/L — ABNORMAL HIGH (ref 0–37)
Albumin: 1.6 g/dL — ABNORMAL LOW (ref 3.5–5.2)
Alkaline Phosphatase: 131 U/L — ABNORMAL HIGH (ref 39–117)
CO2: 33 mEq/L — ABNORMAL HIGH (ref 19–32)
Chloride: 93 mEq/L — ABNORMAL LOW (ref 96–112)
Creatinine, Ser: 0.56 mg/dL (ref 0.50–1.35)
GFR calc non Af Amer: >90 mL/min (ref >90)
Potassium: 4.3 mEq/L (ref 3.5–5.1)
Sodium: 129 mEq/L — ABNORMAL LOW (ref 135–145)
Total Bilirubin: 0.5 mg/dL (ref 0.3–1.2)

## 2011-12-17 LAB — DIFFERENTIAL
Eosinophils Relative: 9 % — ABNORMAL HIGH (ref 0–5)
Lymphocytes Relative: 13 % (ref 12–46)
Lymphs Abs: 1.1 10*3/uL (ref 0.7–4.0)
Monocytes Relative: 17 % — ABNORMAL HIGH (ref 3–12)

## 2011-12-17 LAB — TYPE AND SCREEN
ABO/RH(D): O POS
Antibody Screen: NEGATIVE

## 2011-12-17 LAB — PHOSPHORUS: Phosphorus: 4.4 mg/dL (ref 2.3–4.6)

## 2011-12-17 LAB — GLUCOSE, CAPILLARY: Glucose-Capillary: 124 mg/dL — ABNORMAL HIGH (ref 70–99)

## 2011-12-17 MED ORDER — HYDROMORPHONE 0.3 MG/ML IV SOLN
INTRAVENOUS | Status: AC
  Administered 2011-12-17: 0.3 mg via INTRAVENOUS
  Filled 2011-12-17: qty 25

## 2011-12-17 MED ORDER — HYDROMORPHONE 0.3 MG/ML IV SOLN
INTRAVENOUS | Status: AC
  Filled 2011-12-17: qty 25

## 2011-12-17 MED ORDER — TRACE MINERALS CR-CU-MN-SE-ZN 10-1000-500-60 MCG/ML IV SOLN
INTRAVENOUS | Status: AC
  Administered 2011-12-17: 17:00:00 via INTRAVENOUS
  Filled 2011-12-17: qty 2000

## 2011-12-17 MED ORDER — FAT EMULSION 20 % IV EMUL
250.0000 mL | INTRAVENOUS | Status: AC
  Administered 2011-12-17: 250 mL via INTRAVENOUS
  Filled 2011-12-17: qty 250

## 2011-12-17 MED ORDER — HEPARIN SODIUM (PORCINE) 5000 UNIT/ML IJ SOLN
5000.0000 [IU] | Freq: Three times a day (TID) | INTRAMUSCULAR | Status: DC
  Administered 2011-12-17 – 2011-12-26 (×28): 5000 [IU] via SUBCUTANEOUS
  Filled 2011-12-17 (×32): qty 1

## 2011-12-17 NOTE — Progress Notes (Signed)
Patient ID: Zachary Walls, male   DOB: 1956-02-16, 56 y.o.   MRN: [id number] 6 Days Post-Op  Subjective: Pt says he's starving.  Otherwise is feeling better.  Objective: Vital signs in last 24 hours: Temp:  [97.9 F (36.6 C)-99.3 F (37.4 C)] 98.3 F (36.8 C) (02/25 0543) Pulse Rate:  [66-80] 68  (02/25 0543) Resp:  [16-18] 16  (02/25 0739) BP: (92-120)/(52-72) 101/53 mmHg (02/25 0543) SpO2:  [90 %-100 %] 92 % (02/25 0757) Last BM Date: 12/13/11  Intake/Output from previous day: 02/24 0701 - 02/25 0700 In: 3135.4 [I.V.:1254.2; TPN:1881.3] Out: 3020 [Urine:2700; Drains:320] Intake/Output this shift:    PE: Abd: soft, tender, slight amount of air in ostomy.  Few BS, ND. Incisional wound is clean and packed. g-tube in place. Both JPs with serosang output.  Lab Results:   Basename 12/17/11 0538 12/15/11 0600  WBC 8.2 8.4  HGB 9.2* 8.8*  HCT 27.7* 26.0*  PLT 388 311   BMET  Basename 12/17/11 0538 12/16/11 0533  NA 129* 133*  K 4.3 3.6  CL 93* 96  CO2 33* 33*  GLUCOSE 115* 122*  BUN 11 10  CREATININE 0.56 0.56  CALCIUM 8.1* 8.0*   PT/INR No results found for this basename: LABPROT:2,INR:2 in the last 72 hours   Studies/Results: No results found.  Anti-infectives: Anti-infectives     Start     Dose/Rate Route Frequency Ordered Stop   12/11/11 0900   ertapenem (INVANZ) 1 g in sodium chloride 0.9 % 50 mL IVPB        1 g 100 mL/hr over 30 Minutes Intravenous Every 24 hours 12/11/11 0729     12/01/11 1100   fluconazole (DIFLUCAN) IVPB 400 mg  Status:  Discontinued        400 mg 200 mL/hr over 60 Minutes Intravenous Daily 12/01/11 0937 12/12/11 0755   11/24/11 1800   metroNIDAZOLE (FLAGYL) IVPB 500 mg  Status:  Discontinued        500 mg 100 mL/hr over 60 Minutes Intravenous Every 8 hours 11/24/11 1506 12/11/11 0729   11/24/11 1509   ciprofloxacin (CIPRO) IVPB 400 mg  Status:  Discontinued        400 mg 200 mL/hr over 60 Minutes Intravenous Every 12 hours  11/24/11 1506 12/11/11 0729   11/24/11 1000   ciprofloxacin (CIPRO) IVPB 400 mg        400 mg 200 mL/hr over 60 Minutes Intravenous  Once 11/24/11 0950 11/24/11 1106   11/24/11 1000   metroNIDAZOLE (FLAGYL) IVPB 500 mg        500 mg 100 mL/hr over 60 Minutes Intravenous  Once 11/24/11 0950 11/24/11 1212   11/24/11 0815   cefTRIAXone (ROCEPHIN) 1 g in dextrose 5 % 50 mL IVPB        1 g 100 mL/hr over 30 Minutes Intravenous  Once 11/24/11 0800 11/24/11 0852           Assessment/Plan  1. S/p Hartmanns, SBR x 2 for tics with SBO. 2. PCM/TNA 3. Post-op ileus 4. Deconditioning 5. HOH  Plan: 1. PT/OT consult 2. Clamp g-tube and allow sips of clears today 3. Cont TNA for now.   LOS: 23 days    Terilynn Buresh E 12/17/2011

## 2011-12-17 NOTE — Plan of Care (Signed)
Problem: Inadequate Intake (NI-2.1) Goal: Food and/or nutrient delivery Individualized approach for food/nutrient provision.  Outcome: Not Progressing Pt still NPO except for sips of clears and coffee with no creamer, relying on TNA for nutrition

## 2011-12-17 NOTE — Progress Notes (Signed)
So far tolerating g tube clamp and tolerating juice and coffee. abd soft, nd. Ostomy viable but no air in bag.  Cont TPN for now Cont sips of clears today  Mary Sella. Andrey Campanile, MD, FACS General, Bariatric, & Minimally Invasive Surgery [facility], [place]

## 2011-12-17 NOTE — Progress Notes (Addendum)
PARENTERAL NUTRITION CONSULT NOTE - Follow-Up  Pharmacy Consult for TNA Indication: Diverticulitis w/abscess, partial SBO, s/p colectomy/colostomy  Allergies  Allergen Reactions  . Codeine Rash    Patient Measurements: Height: 5\' 9"  (175.3 cm) Weight: 122 lb 5.7 oz (55.5 kg) IBW/kg (Calculated) : 70.7   Vital Signs: Temp: 98.3 F (36.8 C) (02/25 0543) Temp src: Oral (02/25 0543) BP: 101/53 mmHg (02/25 0543) Pulse Rate: 68  (02/25 0543)  Intake/Output from previous 24hr:   3135/3270 = -135 mL  Labs:  Basename 12/17/11 0538 12/15/11 0600  WBC 8.2 8.4  HGB 9.2* 8.8*  HCT 27.7* 26.0*  PLT 388 311  APTT -- --  INR -- --     Basename 12/17/11 0538 12/16/11 0533 12/15/11 0600  NA 129* 133* 129*  K 4.3 3.6 3.3*  CL 93* 96 93*  CO2 33* 33* 33*  GLUCOSE 115* 122* 118*  BUN 11 10 9   CREATININE 0.56 0.56 0.57  LABCREA -- -- --  CREAT24HRUR -- -- --  CALCIUM 8.1* 8.0* 7.4*  MG 1.9 -- --  PHOS 4.4 -- --  PROT 5.7* -- --  ALBUMIN 1.6* -- --  AST 170* -- --  ALT 143* -- --  ALKPHOS 131* -- --  BILITOT 0.5 -- --  BILIDIR -- -- --  IBILI -- -- --  PREALBUMIN -- -- --  TRIG -- -- --  CHOLHDL -- -- --  CHOL -- -- --  Corrected Ca 9.1  Estimated Creatinine Clearance: 81.9 ml/min (by C-G formula based on Cr of 0.56).   CBGs: 104-130  Medications:  Scheduled:     . albuterol  2.5 mg Nebulization Q6H WA  . alteplase  2 mg Intracatheter Once  . ertapenem  1 g Intravenous Q24H  . HYDROmorphone PCA 0.3 mg/mL   Intravenous Q4H  . HYDROmorphone PCA 0.3 mg/mL      . lip balm  1 application Topical BID  . pantoprazole (PROTONIX) IV  40 mg Intravenous QHS  . DISCONTD: heparin subcutaneous  5,000 Units Subcutaneous Q8H   Infusions:     . sodium chloride 50 mL/hr at 12/16/11 1715  . TPN (CLINIMIX) +/- additives 75 mL/hr at 12/15/11 1718  . TPN (CLINIMIX) +/- additives 75 mL/hr at 12/16/11 1750    Insulin Requirements in previous 24 hours:  Sliding scale insulin  was DCd 12/09/11  Current Nutrition:   Clinimix E 5/20@75ml /hr  NPO except ice chips started 2/21    Nutritional Goals:  Per RD assessment 11/28/11:  Kcal:1800-2050  Protein:80-95g  TNA Clinimix 5/20 at goal rate of 59ml/hr plus Lipids on MWF to provide 90g/d protein and 2064 Kcal/d on lipid days, 1584 Kcal/d on non-lipid days (avg 1790 KCal/d over a week).  Assessment:  56 y/o M with diverticulitis/abscess and superimposed SBO.    POD #6 for sigmoid colectomy, colostomy, small bowel resx x2 w/primary anastamosis.   Post op ileus, NG tube out, G tube to gravity drain - 320 mL output last 24hrs  Tolerating TNA at goal rate.  MIVF NaCl @ 28ml/hr.  Lytes:  Na dropping 129 from 133 - patient with continued hyponatremia - management per Md  K WNL but increasing - 4.3 from 3.6 - will monitor for now and recheck tomorrow  AST/ALT have increased significantly to 170/143 from 31/12 last week  CBG ok - not on SSI currently  Plan:   Due to significant rise in LFTs, likely giving patient too much dextrose leading to this rise  %  Dextrose is about 60% of calories on days receiving Lipid and 77% of calories on days without lipid  Will change TNA formula to E 5/15 to provide a smaller % of calories from dextrose and monitor LFTs  New E 5/15 formulation at a goal rate of 80 ml/hr will provide 96g protein daily and 1843 kcal/d on lipid days, 1363 kcal/d on non lipid days (avg 1569 kcal/d over a week)  Lipids 20% at 36ml/hr on MWF only due to ongoing Sport and exercise psychologist.  Multivitamins and trace elements on MWF only due to ongoing Sport and exercise psychologist.  Continue thiamine 100mg  daily in each 2L TNA.  Follow TNA labs as ordered   Hessie Knows, PharmD, BCPS pager [phone] 12/17/2011 8:16 AM

## 2011-12-17 NOTE — Progress Notes (Signed)
Nutrition Follow-up  Diet Order: Clearance Coots of clears, coffee allowed without creamer  TNA: Clinimix E 5/20 @ 75 ml/hr.  Lipids (20% IVFE @ 10 ml/hr), multivitamins, and trace elements are provided 3 times weekly (MWF) due to national backorder.  Provides 1790 kcal and 90 grams protein daily (based on weekly average).  Meets 99% minimum estimated kcal and 100% minimum estimated protein needs.  Additional IVF with NS @ 50 ml/hr.  - POD # 6 exploratory laparotomy with lysis of adhesions, sigmoid colectomy, end colostomy, G tube placement, appendectomy, small bowel resection times two with primary anastomosis, and cystoscopy with insertion of left ureteral catheter. MD reports pt with expected ileus and desires TNA to be continued. G tube set to drain. Pt currently up walking around the halls with nurse tech. RN reports pt had some nausea this morning, but did not have any GI issues after drinking cup of coffee. Noted pt with stage 1 pressure ulcer on sacrum first assessed on 2/21.   Meds: Scheduled Meds:   . albuterol  2.5 mg Nebulization Q6H WA  . alteplase  2 mg Intracatheter Once  . ertapenem  1 g Intravenous Q24H  . heparin subcutaneous  5,000 Units Subcutaneous Q8H  . HYDROmorphone PCA 0.3 mg/mL   Intravenous Q4H  . HYDROmorphone PCA 0.3 mg/mL      . lip balm  1 application Topical BID  . pantoprazole (PROTONIX) IV  40 mg Intravenous QHS  . DISCONTD: heparin subcutaneous  5,000 Units Subcutaneous Q8H   Continuous Infusions:   . sodium chloride 50 mL/hr at 12/16/11 1715  . TPN (CLINIMIX) +/- additives     And  . fat emulsion    . TPN (CLINIMIX) +/- additives 75 mL/hr at 12/15/11 1718  . TPN (CLINIMIX) +/- additives 75 mL/hr at 12/16/11 1750   PRN Meds:.albuterol, diphenhydrAMINE, diphenhydrAMINE, LORazepam, naloxone, ondansetron, ondansetron (ZOFRAN) IV, phenol, sodium chloride, sodium chloride  Labs:  CMP     Component Value Date/Time   NA 129* 12/17/2011 0538   K 4.3 12/17/2011 0538     CL 93* 12/17/2011 0538   CO2 33* 12/17/2011 0538   GLUCOSE 115* 12/17/2011 0538   BUN 11 12/17/2011 0538   CREATININE 0.56 12/17/2011 0538   CALCIUM 8.1* 12/17/2011 0538   PROT 5.7* 12/17/2011 0538   ALBUMIN 1.6* 12/17/2011 0538   AST 170* 12/17/2011 0538   ALT 143* 12/17/2011 0538   ALKPHOS 131* 12/17/2011 0538   BILITOT 0.5 12/17/2011 0538   GFRNONAA >90 12/17/2011 0538   GFRAA >90 12/17/2011 0538   CBG (last 3)   Basename 12/16/11 2255 12/16/11 1423 12/16/11 0626  GLUCAP 121* 104* 130*   - Noted continued low sodium, management per MD - Potassium WNL but increased from 3.6 on 2/24 to 4.3 today - AST/ALT increased significantly to 170/143 from 31/12 last week, noted pharmacist plans to decrease % calories from dextrose to help with this - Continues to get supplemental thiamine daily - Albumin stable, not likely to improve soon r/t inflammatory response of surgery  - CBGs <150   Intake/Output Summary (Last 24 hours) at 12/17/11 0937 Last data filed at 12/17/11 4098  Gross per 24 hour  Intake 3135.42 ml  Output   2820 ml  Net 315.42 ml  G tube output - in the past 24 hours Colostomy output - 50ml so far today JP drain #1 - 0ml output so far today JP drain # 2 - 50ml output so far today  Weight Status:  2/19 51.9kg 2/22 55.5kg  Nutrition Dx: Inadequate oral intake - ongoing  Goal: TNA to meet >90% of estimated needs - met New goal: Improvement in LFTs and diet advancement.   Intervention: TNA per pharmacy, hopefully diet will be advanced soon as allowed per MD, as pt tolerating liquids.   Monitor: Weights, labs, LFTs, diet advancement   Marshall Cork Pager #: [phone]

## 2011-12-18 LAB — COMPREHENSIVE METABOLIC PANEL
ALT: 146 U/L — ABNORMAL HIGH (ref 0–53)
Alkaline Phosphatase: 143 U/L — ABNORMAL HIGH (ref 39–117)
BUN: 13 mg/dL (ref 6–23)
CO2: 33 mEq/L — ABNORMAL HIGH (ref 19–32)
GFR calc Af Amer: >90 mL/min (ref >90)
GFR calc non Af Amer: >90 mL/min (ref >90)
Glucose, Bld: 117 mg/dL — ABNORMAL HIGH (ref 70–99)
Potassium: 4 mEq/L (ref 3.5–5.1)
Sodium: 129 mEq/L — ABNORMAL LOW (ref 135–145)

## 2011-12-18 LAB — GLUCOSE, CAPILLARY
Glucose-Capillary: 118 mg/dL — ABNORMAL HIGH (ref 70–99)
Glucose-Capillary: 121 mg/dL — ABNORMAL HIGH (ref 70–99)

## 2011-12-18 MED ORDER — THIAMINE HCL 100 MG/ML IJ SOLN
INTRAVENOUS | Status: AC
  Administered 2011-12-18: 18:00:00 via INTRAVENOUS
  Filled 2011-12-18: qty 2000

## 2011-12-18 MED ORDER — HYDROMORPHONE 0.3 MG/ML IV SOLN
INTRAVENOUS | Status: AC
  Administered 2011-12-18: 0.6 mg via INTRAVENOUS
  Filled 2011-12-18: qty 25

## 2011-12-18 MED ORDER — HYDROMORPHONE 0.3 MG/ML IV SOLN
INTRAVENOUS | Status: AC
  Filled 2011-12-18: qty 25

## 2011-12-18 NOTE — Progress Notes (Signed)
Patient ID: Zachary Walls, male   DOB: [birth year], 56 y.o.   MRN: [id number] 7 Days Post-Op  Subjective: Pt up moving better.  Still c/o pain.  No nausea with sips and g tube clamped.  Objective: Vital signs in last 24 hours: Temp:  [98 F (36.7 C)-98.2 F (36.8 C)] 98.2 F (36.8 C) (02/26 0610) Pulse Rate:  [69-80] 78  (02/26 0610) Resp:  [16-20] 18  (02/26 0838) BP: (98-104)/(57-68) 100/68 mmHg (02/26 0610) SpO2:  [96 %-100 %] 98 % (02/26 0849) FiO2 (%):  [2 %] 2 % (02/26 0849) Last BM Date:  (colostomy)  Intake/Output from previous day: 02/25 0701 - 02/26 0700 In: 3558.4 [P.O.:840; I.V.:1065.6; TPN:1652.8] Out: 3415 [Urine:3100; Drains:315] Intake/Output this shift:    PE: Abd: soft, tender, +BS, ostomy with stool and air present.  Wound clean.  JPs with serous output.   Lab Results:   [facility] 12/17/11 0538  WBC 8.2  HGB 9.2*  HCT 27.7*  PLT 388   BMET  Basename 12/18/11 0545 12/17/11 0538  NA 129* 129*  K 4.0 4.3  CL 93* 93*  CO2 33* 33*  GLUCOSE 117* 115*  BUN 13 11  CREATININE 0.62 0.56  CALCIUM 8.2* 8.1*   PT/INR No results found for this basename: LABPROT:2,INR:2 in the last 72 hours   Studies/Results: No results found.  Anti-infectives: Anti-infectives     Start     Dose/Rate Route Frequency Ordered Stop   12/11/11 0900   ertapenem (INVANZ) 1 g in sodium chloride 0.9 % 50 mL IVPB        1 g 100 mL/hr over 30 Minutes Intravenous Every 24 hours 12/11/11 0729 12/18/11 0905   12/01/11 1100   fluconazole (DIFLUCAN) IVPB 400 mg  Status:  Discontinued        400 mg 200 mL/hr over 60 Minutes Intravenous Daily 12/01/11 0937 12/12/11 0755   11/24/11 1800   metroNIDAZOLE (FLAGYL) IVPB 500 mg  Status:  Discontinued        500 mg 100 mL/hr over 60 Minutes Intravenous Every 8 hours 11/24/11 1506 12/11/11 0729   11/24/11 1509   ciprofloxacin (CIPRO) IVPB 400 mg  Status:  Discontinued        400 mg 200 mL/hr over 60 Minutes Intravenous Every 12 hours  11/24/11 1506 12/11/11 0729   11/24/11 1000   ciprofloxacin (CIPRO) IVPB 400 mg        400 mg 200 mL/hr over 60 Minutes Intravenous  Once 11/24/11 0950 11/24/11 1106   11/24/11 1000   metroNIDAZOLE (FLAGYL) IVPB 500 mg        500 mg 100 mL/hr over 60 Minutes Intravenous  Once 11/24/11 0950 11/24/11 1212   11/24/11 0815   cefTRIAXone (ROCEPHIN) 1 g in dextrose 5 % 50 mL IVPB        1 g 100 mL/hr over 30 Minutes Intravenous  Once 11/24/11 0800 11/24/11 0852           Assessment/Plan  1. Tics with SBO, s/p ex lap with Hartmann's and SBRx2 2. Deconditioning 3. PCM/TNA  Plan: 1. Increase diet to clears 2. Cont wound care 3. Will start to wean TNA tomorrow if tol clears today  4. Follow PT/OT recs 5. Anticipate we can dc JP drain 1 today, will d/w MD  LOS: 24 days    Jelesa Mangini E 12/18/2011

## 2011-12-18 NOTE — Evaluation (Signed)
Occupational Therapy Evaluation Patient Details Name: Zachary Walls MRN: [id number] DOB: 07/29/1956 Today's Date: 12/18/2011  Problem List:  Patient Active Problem List  Diagnoses  . Diverticulitis of large intestine with perforation/abscess  . COPD (chronic obstructive pulmonary disease)  . Hypertension  . SBO (small bowel obstruction)  . HOH (hard of hearing)    Past Medical History:  Past Medical History  Diagnosis Date  . COPD (chronic obstructive pulmonary disease)   . Pneumonia   . Hypertension   . Diverticulitis of large intestine with perforation/abscess 12/01/2011  . HOH (hard of hearing) 12/01/2011   Past Surgical History:  Past Surgical History  Procedure Date  . Hernia repair     inguinal    OT Assessment/Plan/Recommendation OT Assessment Clinical Impression Statement: This 56 year old male was admitted with diverticulitis and is s/p colostomy.  He lives alone and was independent with adls PTA.  He is currently min guard to min A.  He is appropriate for skilled OT in acute with overall supervision level goals OT Recommendation/Assessment: Patient will need skilled OT in the acute care venue OT Problem List: Decreased strength;Decreased activity tolerance;Impaired balance (sitting and/or standing);Pain;Decreased knowledge of use of DME or AE OT Therapy Diagnosis : Generalized weakness OT Plan OT Frequency: Min 2X/week OT Treatment/Interventions: Self-care/ADL training;Therapeutic activities;Patient/family education;Balance training;DME and/or AE instruction OT Recommendation Follow Up Recommendations: Home health OT;Other (comment) (depending on progress/support available) Equipment Recommended: 3 in 1 bedside comode;Other (comment) (if pt continues to need to use bathroom (has colostomy)) OT Goals Acute Rehab OT Goals OT Goal Formulation: With patient Time For Goal Achievement: 2 weeks ADL Goals Pt Will Transfer to Toilet: with supervision;Ambulation;3-in-1;Other  (comment) (if pt has toileting needs) ADL Goal: Toilet Transfer - Progress: Goal set today Pt Will Perform Toileting - Clothing Manipulation: with modified independence;Standing ADL Goal: Toileting - Clothing Manipulation - Progress: Goal set today Pt Will Perform Toileting - Hygiene: with modified independence;Sit to stand from 3-in-1/toilet ADL Goal: Toileting - Hygiene - Progress: Goal set today Miscellaneous OT Goals Miscellaneous OT Goal #1: Pt will gather clothes/ADL supplies at supervision level with AD OT Goal: Miscellaneous Goal #1 - Progress: Goal set today Miscellaneous OT Goal #2: Pt will complete LB ADLs at supervision level, sit to stand OT Goal: Miscellaneous Goal #2 - Progress: Goal set today  OT Evaluation Precautions/Restrictions  Precautions Precaution Comments: multiple lines and drains Restrictions Weight Bearing Restrictions: No Prior Functioning Home Living Bathroom Shower/Tub: Tub/shower unit Bathroom Toilet: Standard Prior Function Level of Independence: Independent with basic ADLs;Independent with transfers;Independent with gait;Needs assistance with homemaking ADL ADL Eating/Feeding: Performed;Independent (clear liquids) Where Assessed - Eating/Feeding: Chair Grooming: Simulated;Set up Where Assessed - Grooming: Standing at sink Upper Body Bathing: Simulated;Set up Where Assessed - Upper Body Bathing: Supported;Sitting, chair Lower Body Bathing: Simulated;Minimal assistance Where Assessed - Lower Body Bathing: Sit to stand from chair Upper Body Dressing: Simulated;Minimal assistance;Other (comment) (lines) Where Assessed - Upper Body Dressing: Sit to stand from chair Lower Body Dressing: Simulated;Minimal assistance Where Assessed - Lower Body Dressing: Sit to stand from chair Toilet Transfer: Simulated;Minimal assistance (min guard bed to chair) Toilet Transfer Method: Stand pivot Toileting - Clothing Manipulation: Simulated;Minimal assistance (min  guard) Where Assessed - Toileting Clothing Manipulation: Standing Toileting - Hygiene: Simulated;Minimal assistance (min guard) Where Assessed - Toileting Hygiene: Standing Ambulation Related to ADLs: min guard:  stepped to sink then to chair; didn't use RW ADL Comments: able to cross LEs to reach feet Vision/Perception  Vision - History  Patient Visual Report: No change from baseline Cognition Cognition Overall Cognitive Status: Appears within functional limits for tasks assessed Cognition - Other Comments: very hard of hearing Sensation/Coordination   Extremity Assessment RUE Assessment RUE Assessment: Within Functional Limits LUE Assessment LUE Assessment: Within Functional Limits Mobility  Bed Mobility Rolling Left: With rail;5: Supervision Left Sidelying to Sit: 5: Supervision;HOB elevated (comment degrees) (30) Transfers Sit to Stand: 4: Min assist;With upper extremity assist;From bed Sit to Stand Details (indicate cue type and reason): min guard Exercises   End of Session OT - End of Session Activity Tolerance: Patient limited by fatigue Patient left: in chair;with call bell in reach General Behavior During Session: [facility] for tasks performed Cognition: [facility] for tasks performed [facility], OTR/L [phone] 12/18/2011  Zachary Walls 12/18/2011, 1:11 PM

## 2011-12-18 NOTE — Progress Notes (Signed)
Physical Therapy Treatment Patient Details Name: Zachary Walls MRN: [id number] DOB: [birth year] Today's Date: 12/18/2011  PT Assessment/Plan  PT - Assessment/Plan Comments on Treatment Session: Pt extremely hard of hearing and nursing tech reports his hearing aide is broken.  Pt difficult to communicate with to explain importance of ambulation but seemed set on only returning to bed.  Pt at supervision level due to multiple lines and drains and cords. PT Plan: Discharge plan remains appropriate;Frequency remains appropriate Follow Up Recommendations: LTACH;Skilled nursing facility;Home health PT (continues to depend on progress and family assist) Equipment Recommended: 3 in 1 bedside comode PT Goals  Acute Rehab PT Goals PT Goal: Sit to Supine/Side - Progress: Progressing toward goal PT Goal: Sit to Stand - Progress: Progressing toward goal PT Goal: Ambulate - Progress: Progressing toward goal  PT Treatment Precautions/Restrictions  Precautions Precaution Comments: multiple lines and drains Restrictions Weight Bearing Restrictions: No Mobility (including Balance) Bed Mobility Bed Mobility: Yes Sit to Supine: 5: Supervision;With rail Sit to Supine - Details (indicate cue type and reason): increased time to bring LEs onto bed Transfers Transfers: Yes Sit to Stand: 5: Supervision;From chair/3-in-1 Sit to Stand Details (indicate cue type and reason): supervision 2* multiple lines and drains Stand to Sit: 5: Supervision;With upper extremity assist;To bed Ambulation/Gait Ambulation/Gait Assistance: 5: Supervision Ambulation/Gait Assistance Details (indicate cue type and reason): pt reported he only want to get back into bed and declined further ambulation, supervision 2* multiple lines and drains Ambulation Distance (Feet): 2 Feet Assistive device: None  Dynamic Standing Balance Dynamic Standing - Balance Support: No upper extremity supported;During functional activity Dynamic Standing  - Level of Assistance: 5: Stand by assistance Dynamic Standing - Balance Activities: Forward lean/weight shifting;Reaching for objects Dynamic Standing - Comments: Pt made positioned bed pad and placed pillows to prepare for returning to supine. Exercise    End of Session PT - End of Session Activity Tolerance: Patient tolerated treatment well Patient left: in bed;with call bell in reach General Behavior During Session: [facility] for tasks performed Cognition: [facility] for tasks performed  Tianna Baus,KATHrine E 12/18/2011, 3:51 PM Pager: [phone]

## 2011-12-18 NOTE — Progress Notes (Signed)
Some nausea. Some burping. Air in bag.  abd soft, nd. Ostomy viable with air/some liquid stool.  Stay on clears Cont TPN  Mary Sella. Andrey Campanile, MD, FACS General, Bariatric, & Minimally Invasive Surgery [facility], [place]

## 2011-12-18 NOTE — Progress Notes (Signed)
PARENTERAL NUTRITION CONSULT NOTE - Follow-Up  Pharmacy Consult for TNA Indication: Diverticulitis w/abscess, partial SBO, s/p colectomy/colostomy  Allergies  Allergen Reactions  . Codeine Rash    Patient Measurements: Height: 5\' 9"  (175.3 cm) Weight: 122 lb 5.7 oz (55.5 kg) IBW/kg (Calculated) : 70.7   Vital Signs: Temp: 98.2 F (36.8 C) (02/26 0610) Temp src: Oral (02/26 0610) BP: 100/68 mmHg (02/26 0610) Pulse Rate: 78  (02/26 0610)  Intake/Output from previous 24hr:   3135/3270 = -135 mL  Labs:  [facility] 12/17/11 0538  WBC 8.2  HGB 9.2*  HCT 27.7*  PLT 388  APTT --  INR --     Basename 12/18/11 0545 12/17/11 0538 12/16/11 0533  NA 129* 129* 133*  K 4.0 4.3 3.6  CL 93* 93* 96  CO2 33* 33* 33*  GLUCOSE 117* 115* 122*  BUN 13 11 10   CREATININE 0.62 0.56 0.56  LABCREA -- -- --  CREAT24HRUR -- -- --  CALCIUM 8.2* 8.1* 8.0*  MG -- 1.9 --  PHOS -- 4.4 --  PROT 6.0 5.7* --  ALBUMIN 1.7* 1.6* --  AST 129* 170* --  ALT 146* 143* --  ALKPHOS 143* 131* --  BILITOT 0.3 0.5 --  BILIDIR -- -- --  IBILI -- -- --  PREALBUMIN -- 9.0* --  TRIG -- 84 --  CHOLHDL -- -- --  CHOL -- 79 --  Corrected Ca 9.1  Estimated Creatinine Clearance: 81.9 ml/min (by C-G formula based on Cr of 0.62).   CBGs: 104-130  Medications:  Scheduled:     . albuterol  2.5 mg Nebulization Q6H WA  . alteplase  2 mg Intracatheter Once  . ertapenem  1 g Intravenous Q24H  . heparin subcutaneous  5,000 Units Subcutaneous Q8H  . HYDROmorphone PCA 0.3 mg/mL   Intravenous Q4H  . HYDROmorphone PCA 0.3 mg/mL      . lip balm  1 application Topical BID  . pantoprazole (PROTONIX) IV  40 mg Intravenous QHS   Infusions:     . sodium chloride 50 mL/hr at 12/17/11 1327  . TPN (CLINIMIX) +/- additives 80 mL/hr at 12/17/11 1729   And  . fat emulsion 250 mL (12/17/11 1731)  . TPN (CLINIMIX) +/- additives    . TPN (CLINIMIX) +/- additives 75 mL/hr at 12/16/11 1750    Insulin Requirements  in previous 24 hours:  Sliding scale insulin was DCd 12/09/11  Current Nutrition:   Clinimix E 5/15@80ml /hr  NPO except ice chips started 2/21    Nutritional Goals:  Per RD assessment 11/28/11:  Kcal:1800-2050  Protein:80-95g  TNA Clinimix E5/15 at goal rate of 1ml/hr plus Lipids on MWF to provide 96g/d protein and 1850 Kcal/d on lipid days, 1360 Kcal/d on non-lipid days (avg 1570 KCal/d over a week).  Assessment:  56 y/o M with diverticulitis/abscess and superimposed SBO.    POD #7 for sigmoid colectomy, colostomy, small bowel resx x2 w/primary anastamosis,G tube  Post op ileus, NG tube out, G tube to gravity drain - 200 mL output last 24hrs  Tolerating TNA at goal rate.  MIVF NaCl @ 43ml/hr.  Lytes:  Na dropping 129 from 133 - patient with continued hyponatremia - management per Md  K WNL @ 4.0   AST/ALT had increased significantly from last week; today decreased or unchanged with decrease in dextrose content of TNA  CBG ok - not on SSI currently, avg 110's.  Plan:   Due to significant rise in LFTs, likely giving patient  too much dextrose leading to this rise  % Dextrose is about 60% of calories on days receiving Lipid and 77% of calories on days without lipid  Changed TNA formula to E 5/15 to provide a smaller % of calories from dextrose and monitor LFTs  Lipids 20% at 3ml/hr on MWF only due to ongoing Sport and exercise psychologist.  Multivitamins and trace elements on MWF only due to ongoing Sport and exercise psychologist.  Continue thiamine 100mg  daily in each 2L TNA.  Follow TNA labs as ordered   Leonette Most, PharmD Pager [phone]  12/18/2011 8:22 AM

## 2011-12-19 LAB — BASIC METABOLIC PANEL
CO2: 33 mEq/L — ABNORMAL HIGH (ref 19–32)
Chloride: 95 mEq/L — ABNORMAL LOW (ref 96–112)
Glucose, Bld: 108 mg/dL — ABNORMAL HIGH (ref 70–99)
Potassium: 4.4 mEq/L (ref 3.5–5.1)
Sodium: 131 mEq/L — ABNORMAL LOW (ref 135–145)

## 2011-12-19 MED ORDER — FAT EMULSION 20 % IV EMUL
250.0000 mL | INTRAVENOUS | Status: AC
  Administered 2011-12-19: 250 mL via INTRAVENOUS
  Filled 2011-12-19: qty 250

## 2011-12-19 MED ORDER — TRACE MINERALS CR-CU-MN-SE-ZN 10-1000-500-60 MCG/ML IV SOLN
INTRAVENOUS | Status: AC
  Administered 2011-12-19: 18:00:00 via INTRAVENOUS
  Filled 2011-12-19: qty 2000

## 2011-12-19 MED ORDER — HYDROMORPHONE 0.3 MG/ML IV SOLN
INTRAVENOUS | Status: AC
  Filled 2011-12-19: qty 25

## 2011-12-19 NOTE — Progress Notes (Signed)
Patient ID: Zachary Walls, male   DOB: July 25, 1956, 56 y.o.   MRN: [id number] 8 Days Post-Op  Subjective: Pt feels ok.  No nausea with clears.  Tolerating them well.  Objective: Vital signs in last 24 hours: Temp:  [97.8 F (36.6 C)-99.5 F (37.5 C)] 98.6 F (37 C) (02/27 4098) Pulse Rate:  [76-79] 79  (02/27 0608) Resp:  [18-20] 18  (02/27 0818) BP: (97-113)/(57-72) 100/67 mmHg (02/27 0608) SpO2:  [92 %-100 %] 98 % (02/27 0845) Last BM Date: 12/19/11  Intake/Output from previous day: 02/26 0701 - 02/27 0700 In: 4288.4 [P.O.:1360; I.V.:1006.9; TPN:1891.5] Out: 3490 [Urine:2625; Drains:190; Stool:675] Intake/Output this shift: Total I/O In: -  Out: 300 [Urine:300]  PE: Abd: soft, still tender, but better.  +BS, ND, wound clean, ostomy with appropriate output.  JP drains both with serous output.  Lab Results:   [facility] 12/17/11 0538  WBC 8.2  HGB 9.2*  HCT 27.7*  PLT 388   BMET  Basename 12/19/11 0600 12/18/11 0545  NA 131* 129*  K 4.4 4.0  CL 95* 93*  CO2 33* 33*  GLUCOSE 108* 117*  BUN 13 13  CREATININE 0.64 0.62  CALCIUM 8.8 8.2*   PT/INR No results found for this basename: LABPROT:2,INR:2 in the last 72 hours   Studies/Results: No results found.  Anti-infectives: Anti-infectives     Start     Dose/Rate Route Frequency Ordered Stop   12/11/11 0900   ertapenem (INVANZ) 1 g in sodium chloride 0.9 % 50 mL IVPB        1 g 100 mL/hr over 30 Minutes Intravenous Every 24 hours 12/11/11 0729 12/18/11 0905   12/01/11 1100   fluconazole (DIFLUCAN) IVPB 400 mg  Status:  Discontinued        400 mg 200 mL/hr over 60 Minutes Intravenous Daily 12/01/11 0937 12/12/11 0755   11/24/11 1800   metroNIDAZOLE (FLAGYL) IVPB 500 mg  Status:  Discontinued        500 mg 100 mL/hr over 60 Minutes Intravenous Every 8 hours 11/24/11 1506 12/11/11 0729   11/24/11 1509   ciprofloxacin (CIPRO) IVPB 400 mg  Status:  Discontinued        400 mg 200 mL/hr over 60 Minutes  Intravenous Every 12 hours 11/24/11 1506 12/11/11 0729   11/24/11 1000   ciprofloxacin (CIPRO) IVPB 400 mg        400 mg 200 mL/hr over 60 Minutes Intravenous  Once 11/24/11 0950 11/24/11 1106   11/24/11 1000   metroNIDAZOLE (FLAGYL) IVPB 500 mg        500 mg 100 mL/hr over 60 Minutes Intravenous  Once 11/24/11 0950 11/24/11 1212   11/24/11 0815   cefTRIAXone (ROCEPHIN) 1 g in dextrose 5 % 50 mL IVPB        1 g 100 mL/hr over 30 Minutes Intravenous  Once 11/24/11 0800 11/24/11 0852           Assessment/Plan  1. S/p ex lap with Hartmanns and SBRx2 2. Post op ileus 3. PCM/TNA  Plan: 1. Continue on clear liquids and go slow with patient 2. Cont TNA 3. Cont to mobilize.  Pt ambulating much better.   LOS: 25 days    Brooklinn Longbottom E 12/19/2011

## 2011-12-19 NOTE — Progress Notes (Signed)
PARENTERAL NUTRITION CONSULT NOTE - Follow-Up  Pharmacy Consult for TNA Indication: Diverticulitis w/abscess, partial SBO, s/p colectomy/colostomy  Allergies  Allergen Reactions  . Codeine Rash    Patient Measurements: Height: 5\' 9"  (175.3 cm) Weight: 122 lb 5.7 oz (55.5 kg) IBW/kg (Calculated) : 70.7   Vital Signs: Temp: 98.6 F (37 C) (02/27 0608) Temp src: Oral (02/27 0608) BP: 100/67 mmHg (02/27 0608) Pulse Rate: 79  (02/27 0608)  Labs:  Basename 12/17/11 0538  WBC 8.2  HGB 9.2*  HCT 27.7*  PLT 388  APTT --  INR --     Basename 12/19/11 0600 12/18/11 0545 12/17/11 0538  NA 131* 129* 129*  K 4.4 4.0 4.3  CL 95* 93* 93*  CO2 33* 33* 33*  GLUCOSE 108* 117* 115*  BUN 13 13 11   CREATININE 0.64 0.62 0.56  LABCREA -- -- --  CREAT24HRUR -- -- --  CALCIUM 8.8 8.2* 8.1*  MG -- -- 1.9  PHOS -- -- 4.4  PROT -- 6.0 5.7*  ALBUMIN -- 1.7* 1.6*  AST -- 129* 170*  ALT -- 146* 143*  ALKPHOS -- 143* 131*  BILITOT -- 0.3 0.5  BILIDIR -- -- --  IBILI -- -- --  PREALBUMIN -- -- 9.0*  TRIG -- -- 84  CHOLHDL -- -- --  CHOL -- -- 79    Estimated Creatinine Clearance: 81.9 ml/min (by C-G formula based on Cr of 0.64).   Nutritional Goals:  Per RD assessment 11/28/11:  Kcal:1800-2050  Protein:80-95g  TNA Clinimix E5/15 at goal rate of 62ml/hr plus Lipids on MWF to provide 96g/d protein and 1850 Kcal/d on lipid days, 1360 Kcal/d on non-lipid days (avg 1570 KCal/d over a week).  Current nutrition:   Clinimix E 5/15@80ml /hr  Clear liquid started 2/26  CBGs & Insulin requirements past 24 hours:  Sliding scale insulin was DC'd 12/09/11, CBGs 115-121  IVF:  NS at 50 ml/hr  Labs:  Na 131, CL 95  Other lytes wnl  AST trending down, ALT increased slightly.  TNA formulation has been changed in response to elevated AST/ALT on 2/25  Assessment:   56 y/o M with diverticulitis/abscess and superimposed SBO.  S/p  sigmoid colectomy, colostomy, small bowel resx x2 w/primary  anastamosis 2/19 with post op ileus  TNA started 2/6, advanced to goal rate of 80 ml/hr and formulation changed to Clinimix 5/15 given elevated AST/ALT.  Plan slow advancement of PO diet per Surgery and continue TNA.  Plan:   Continue TNA with Clinimix E 5/15 at goal rate of 80 ml/hr  Continue thiamine 100mg  daily in each 2L TNA.  Lipids 20% at 37ml/hr on MWF only due to ongoing Sport and exercise psychologist.   MVI and trace elements MWF only due to ongoing national shortage   F/u AM TNA labs  F/u diet toleration with plan to wean TNA    Geoffry Paradise, PharmD.   Pager:  [phone] 9:29 AM

## 2011-12-19 NOTE — Progress Notes (Signed)
Tolerating clears. No nausea. Min pain.  abd soft, nt, min distension. Stool in bag.   Stay on clears for now. Cont TPN If does well today, will adv to full tomorrow.  Ostomy teaching  Mary Sella. Andrey Campanile, MD, FACS General, Bariatric, & Minimally Invasive Surgery [facility], [place]

## 2011-12-19 NOTE — Progress Notes (Signed)
PT Cancellation Note  Treatment cancelled today due to patient's refusal to participate.Pt states that he just got back after ambulating with nurses 2 laps around floor and wants to rest. Will attempt treatment tomorrow pending availability.  Milana Kidney 12/19/2011, 4:54 PM

## 2011-12-19 NOTE — Progress Notes (Signed)
Patient discussed at the Long Length of Stay Zachary Walls 12/19/2011  

## 2011-12-20 LAB — COMPREHENSIVE METABOLIC PANEL
ALT: 92 U/L — ABNORMAL HIGH (ref 0–53)
Alkaline Phosphatase: 135 U/L — ABNORMAL HIGH (ref 39–117)
BUN: 12 mg/dL (ref 6–23)
CO2: 30 mEq/L (ref 19–32)
Chloride: 96 mEq/L (ref 96–112)
GFR calc Af Amer: >90 mL/min (ref >90)
GFR calc non Af Amer: >90 mL/min (ref >90)
Glucose, Bld: 105 mg/dL — ABNORMAL HIGH (ref 70–99)
Potassium: 4.2 mEq/L (ref 3.5–5.1)
Sodium: 132 mEq/L — ABNORMAL LOW (ref 135–145)
Total Bilirubin: 0.2 mg/dL — ABNORMAL LOW (ref 0.3–1.2)
Total Protein: 6.1 g/dL (ref 6.0–8.3)

## 2011-12-20 LAB — MAGNESIUM: Magnesium: 1.9 mg/dL (ref 1.5–2.5)

## 2011-12-20 LAB — GLUCOSE, CAPILLARY: Glucose-Capillary: 110 mg/dL — ABNORMAL HIGH (ref 70–99)

## 2011-12-20 MED ORDER — OXYCODONE-ACETAMINOPHEN 5-325 MG PO TABS
1.0000 | ORAL_TABLET | ORAL | Status: DC | PRN
  Administered 2011-12-20: 1 via ORAL
  Administered 2011-12-20 – 2011-12-26 (×28): 2 via ORAL
  Filled 2011-12-20 (×30): qty 2

## 2011-12-20 MED ORDER — ENSURE CLINICAL ST REVIGOR PO LIQD
237.0000 mL | Freq: Three times a day (TID) | ORAL | Status: DC
  Administered 2011-12-21 – 2011-12-26 (×11): 237 mL via ORAL

## 2011-12-20 MED ORDER — KETOROLAC TROMETHAMINE 15 MG/ML IJ SOLN
15.0000 mg | Freq: Four times a day (QID) | INTRAMUSCULAR | Status: AC | PRN
  Administered 2011-12-20 – 2011-12-25 (×8): 15 mg via INTRAVENOUS
  Filled 2011-12-20 (×8): qty 1

## 2011-12-20 MED ORDER — THIAMINE HCL 100 MG/ML IJ SOLN
INTRAVENOUS | Status: DC
  Administered 2011-12-20: 18:00:00 via INTRAVENOUS
  Filled 2011-12-20: qty 2000

## 2011-12-20 NOTE — Progress Notes (Signed)
Patient ID: Zachary Walls, male   DOB: Mar 18, 1956, 56 y.o.   MRN: [id number] 9 Days Post-Op  Subjective: Pt continues to feel better on a daily basis.  Tolerating clear liquids for 2 days without any difficulty.  Patient states he is unable to manage his ostomy by himself.  Objective: Vital signs in last 24 hours: Temp:  [97.7 F (36.5 C)-98.5 F (36.9 C)] 97.7 F (36.5 C) (02/28 5621) Pulse Rate:  [70-78] 78  (02/28 0608) Resp:  [16-20] 16  (02/28 0608) BP: (98-107)/(62-67) 98/62 mmHg (02/28 0608) SpO2:  [97 %-100 %] 97 % (02/27 2218) Last BM Date: 12/19/11  Intake/Output from previous day: 02/27 0701 - 02/28 0700 In: 5049.5 [P.O.:1800; I.V.:1214.5; TPN:2035] Out: 5606 [Urine:4360; Drains:146; Stool:1100] Intake/Output this shift: Total I/O In: -  Out: 225 [Urine:225]  PE: Abd: soft, much less tender, +BS, ostomy with good output.  Both JPs with serous output.  Wound is clean and packed.  Lab Results:  No results found for this basename: WBC:2,HGB:2,HCT:2,PLT:2 in the last 72 hours BMET  Basename 12/20/11 0635 12/19/11 0600  NA 132* 131*  K 4.2 4.4  CL 96 95*  CO2 30 33*  GLUCOSE 105* 108*  BUN 12 13  CREATININE 0.63 0.64  CALCIUM 8.4 8.8   PT/INR No results found for this basename: LABPROT:2,INR:2 in the last 72 hours   Studies/Results: No results found.  Anti-infectives: Anti-infectives     Start     Dose/Rate Route Frequency Ordered Stop   12/11/11 0900   ertapenem (INVANZ) 1 g in sodium chloride 0.9 % 50 mL IVPB        1 g 100 mL/hr over 30 Minutes Intravenous Every 24 hours 12/11/11 0729 12/18/11 0905   12/01/11 1100   fluconazole (DIFLUCAN) IVPB 400 mg  Status:  Discontinued        400 mg 200 mL/hr over 60 Minutes Intravenous Daily 12/01/11 0937 12/12/11 0755   11/24/11 1800   metroNIDAZOLE (FLAGYL) IVPB 500 mg  Status:  Discontinued        500 mg 100 mL/hr over 60 Minutes Intravenous Every 8 hours 11/24/11 1506 12/11/11 0729   11/24/11 1509    ciprofloxacin (CIPRO) IVPB 400 mg  Status:  Discontinued        400 mg 200 mL/hr over 60 Minutes Intravenous Every 12 hours 11/24/11 1506 12/11/11 0729   11/24/11 1000   ciprofloxacin (CIPRO) IVPB 400 mg        400 mg 200 mL/hr over 60 Minutes Intravenous  Once 11/24/11 0950 11/24/11 1106   11/24/11 1000   metroNIDAZOLE (FLAGYL) IVPB 500 mg        500 mg 100 mL/hr over 60 Minutes Intravenous  Once 11/24/11 0950 11/24/11 1212   11/24/11 0815   cefTRIAXone (ROCEPHIN) 1 g in dextrose 5 % 50 mL IVPB        1 g 100 mL/hr over 30 Minutes Intravenous  Once 11/24/11 0800 11/24/11 0852           Assessment/Plan  1. S/p ex lap with Hartmann's and SBRx2 2. Post op ileus 3. Deconditioning, improving 4. PCM/TNA  Plan: 1. Advance diet today to full liquids, will consider weaning TNA over the next couple days if he continues to tolerate his diet advancements. 2. Cont PT 3. Cont WOC, RN teaching of how to manage his ostomy 4. Dc PCA and start po pain meds.   LOS: 26 days    Sicily Zaragoza E 12/20/2011

## 2011-12-20 NOTE — Consult Note (Signed)
WOC ostomy consult  Stoma type/location: LLQ Colostomy Stomal assessment/size: 1 and 5/8 inches round, red, budded stoma with os at center. Peristomal assessment: generally flat, small crease 2 inches superior to stoma.  Clear. Treatment options for stomal/peristomal skin: none noted Output : moderate amount of thin, brown stool with solid particles.  Patient on full liquid diet after having had an ileus for some time. Ostomy pouching: 1pc convex drainable pouch; [place] [phone] Education provided: Session with patient today for teaching of stomal characteristics, pouch characteristics. Patient understands that effluent will be emptied several times each day (4-6) into toilet and that pouch (once the proper system for him has been identified) will be changed twice weekly. Patient observed pouch change today and observed and then demonstrated opening and closing of [place] pouch closure.  I will ask staff to assist patient with the emptying of his pouch tomorrow (Friday) until he is independent. We will continue to follow. Thanks, Ladona Mow, MSN, RN, [place], GNP [phone])

## 2011-12-20 NOTE — Progress Notes (Signed)
PARENTERAL NUTRITION CONSULT NOTE - Follow-Up  Pharmacy Consult for TNA Indication: Diverticulitis w/abscess, partial SBO, s/p colectomy/colostomy  Allergies  Allergen Reactions  . Codeine Rash    Patient Measurements: Height: 5\' 9"  (175.3 cm) Weight: 122 lb 5.7 oz (55.5 kg) IBW/kg (Calculated) : 70.7   Vital Signs: Temp: 97.7 F (36.5 C) (02/28 0608) Temp src: Oral (02/28 0608) BP: 98/62 mmHg (02/28 0608) Pulse Rate: 78  (02/28 0608)  Labs:  Basename 12/20/11 0635 12/19/11 0600 12/18/11 0545  NA 132* 131* 129*  K 4.2 4.4 4.0  CL 96 95* 93*  CO2 30 33* 33*  GLUCOSE 105* 108* 117*  BUN 12 13 13   CREATININE 0.63 0.64 0.62  LABCREA -- -- --  CREAT24HRUR -- -- --  CALCIUM 8.4 8.8 8.2*  MG 1.9 -- --  PHOS 4.9* -- --  PROT 6.1 -- 6.0  ALBUMIN 1.9* -- 1.7*  AST 59* -- 129*  ALT 92* -- 146*  ALKPHOS 135* -- 143*  BILITOT 0.2* -- 0.3  BILIDIR -- -- --  IBILI -- -- --  PREALBUMIN -- -- --  TRIG -- -- --  CHOLHDL -- -- --  CHOL -- -- --    Estimated Creatinine Clearance: 81.9 ml/min (by C-G formula based on Cr of 0.63).   Nutritional Goals:  Per RD assessment 11/28/11:  Kcal:1800-2050  Protein:80-95g  TNA Clinimix E5/15 at goal rate of 35ml/hr plus Lipids on MWF to provide 96g/d protein and 1850 Kcal/d on lipid days, 1360 Kcal/d on non-lipid days (avg 1570 KCal/d over a week).  Current nutrition:   Clinimix E 5/15@80ml /hr  Full liquids started today  CBGs & Insulin requirements past 24 hours:  Sliding scale insulin was DC'd 12/09/11. CBGs 109-134.  IVF:  NS at 80ml/hr  Assessment:   56 y/o M with diverticulitis/abscess and superimposed SBO.  S/p  sigmoid colectomy, colostomy, small bowel resx x2 w/primary anastamosis 2/19 with post op ileus, now resolving.  TNA started 2/6, advanced to goal rate of 80 ml/hr and formulation changed to Clinimix 5/15 due to elevated AST/ALT.  LFTs now improving.  Advancing diet slowly as tolerated.  Phos slightly elevated  (receiving lipids currently, which may be contributory).  Plan:   Continue TNA with Clinimix E 5/15 at goal rate of 80 ml/hr.  Opted not to switch to non-E formula given only small elevation in Phos and no lipids being administered tonight.  Continue thiamine 100mg  daily in each 2L TNA.  Lipids 20% at 68ml/hr on MWF only due to ongoing Sport and exercise psychologist.   MVI and trace elements MWF only due to ongoing national shortage   BMet, Phos tomorrow.  Follow tolerance of diet and plans for TNA weaning when appropriate.  Elie Goody, PharmD, BCPS Pager: [phone] 12/20/2011  9:01 AM

## 2011-12-20 NOTE — Progress Notes (Signed)
No emesis. No belching.  abd soft, nd. Ostomy functional and viable Drains - serosang  Mary Sella. Andrey Campanile, MD, FACS General, Bariatric, & Minimally Invasive Surgery [facility], [place]

## 2011-12-21 DIAGNOSIS — E43 Unspecified severe protein-calorie malnutrition: Secondary | ICD-10-CM | POA: Diagnosis present

## 2011-12-21 LAB — GLUCOSE, CAPILLARY: Glucose-Capillary: 110 mg/dL — ABNORMAL HIGH (ref 70–99)

## 2011-12-21 LAB — BASIC METABOLIC PANEL
BUN: 11 mg/dL (ref 6–23)
CO2: 30 mEq/L (ref 19–32)
Chloride: 95 mEq/L — ABNORMAL LOW (ref 96–112)
GFR calc non Af Amer: >90 mL/min (ref >90)
Glucose, Bld: 103 mg/dL — ABNORMAL HIGH (ref 70–99)
Potassium: 4.1 mEq/L (ref 3.5–5.1)
Sodium: 131 mEq/L — ABNORMAL LOW (ref 135–145)

## 2011-12-21 MED ORDER — SODIUM CHLORIDE 0.9 % IV SOLN
INTRAVENOUS | Status: DC
  Administered 2011-12-21 – 2011-12-24 (×4): via INTRAVENOUS
  Administered 2011-12-25: 50 mL/h via INTRAVENOUS

## 2011-12-21 MED ORDER — IPRATROPIUM-ALBUTEROL 18-103 MCG/ACT IN AERO
2.0000 | INHALATION_SPRAY | Freq: Three times a day (TID) | RESPIRATORY_TRACT | Status: DC
  Administered 2011-12-22 – 2011-12-25 (×11): 2 via RESPIRATORY_TRACT
  Filled 2011-12-21: qty 14.7

## 2011-12-21 MED ORDER — PSYLLIUM 95 % PO PACK
1.0000 | PACK | Freq: Two times a day (BID) | ORAL | Status: DC
  Administered 2011-12-21 – 2011-12-25 (×7): 1 via ORAL
  Filled 2011-12-21 (×12): qty 1

## 2011-12-21 MED ORDER — POTASSIUM CHLORIDE IN NACL 20-0.9 MEQ/L-% IV SOLN
INTRAVENOUS | Status: DC
  Filled 2011-12-21: qty 1000

## 2011-12-21 MED ORDER — THIAMINE HCL 100 MG/ML IJ SOLN
INTRAVENOUS | Status: AC
  Filled 2011-12-21: qty 2000

## 2011-12-21 MED ORDER — TRACE MINERALS CR-CU-MN-SE-ZN 10-1000-500-60 MCG/ML IV SOLN
INTRAVENOUS | Status: DC
  Filled 2011-12-21: qty 2000

## 2011-12-21 NOTE — Progress Notes (Signed)
CARE MANAGEMENT NOTE 12/21/2011  Patient:  Zachary Walls, Zachary Walls   Account Number:  [id number]  Date Initiated:  12/06/2011  Documentation initiated by:  Shante Maysonet  Subjective/Objective Assessment:   56 yo male admitted 11/24/11 with abdominal pain     Action/Plan:   D/C when medically stable.   Anticipated DC Date:  12/24/2011   Anticipated DC Plan:  SKILLED NURSING FACILITY  In-house referral  Clinical Social Worker      DC Associate Professor  CM consult      [facility]  HOME HEALTH              Status of service:  In process, will continue to follow  Comments:  12/21/11, Kathi Der RNC-MNN, BSN, [phone] received referral.  CM met with pt  Pt states that he would prefer to go home instead of going to a SNF.  Pt requested that I discuss with his daughter, Hessie Knows.  At pt's request, I called Kacee at [phone], and left a message for her to return my call.  Pt states that he has used [facility] in the past, but doesn't want to use [facility] again.  I offered to show pt. a list of home health agencies, but he requests that I talk to his daughter.  Awaiting return call.  CM spoke with pt's daughter, Hessie Knows.  Hessie Knows states that she is POA for her father and receives his disability checks.  She states after talking with her father,  it would be better if he could go to a SNF in the short term.   Pt lives alone and son and daughter both work,  and would be unable to help during the day. Discussed with pt's daughter that I would make a referrral to CSW to evaluate for SNF.  Pt's daughter in agreement with plan.  Will follow. 12/20/11, Kathi Der RNC-MNN, BSN, [phone], Pt with post op ileus.  Advanced to full liquid diet today, still receiving TNA 12/06/11, Lenette Rau RNC-MNN, BSN, [phone], Pt remains on TNA and has NG tube in place for SBO as well as drains in place for abscesses.

## 2011-12-21 NOTE — Discharge Summary (Signed)
Physician Discharge Summary  Patient ID: Zachary Walls MRN: [id number] DOB/AGE: 1956-05-27 56 y.o.  Admit date: 11/24/2011 Discharge date: 12/26/2011  Admission Diagnoses: Diverticulitis with abscess and SBO .  COPD (chronic obstructive pulmonary disease)    .  Pneumonia    .  Hypertension    Hernia repair  HOH     Discharge Diagnoses:  Principal Problem:  *Diverticulitis of large intestine with perforation/abscess Active Problems:  SBO (small bowel obstruction)  HOH (hard of hearing)  Malnutrition Malnutrition COPD Hypertension Severe HOH  Hearing aide is now working.  PROCEDURES: 1.CT Guided Abscess Drainage in Interventional Radiology 11/26/11 Dr. Fredia Sorrow 2.PICC placement and TNA started 11/29/11 3.Exchange of anterior abdominal drain and placement of new pelvic drain by transgluteal approach, 12/04/11 Dr. Lowella Dandy IR. 4. Cystoscopy and insertion of left Ureteral catheter. 12/11/11 Dr. Bjorn Pippin 5.Exploratory Lap, with 1 hour of lysis adhesions, Sigmoid colectomy and colostomy, appendectomy, small bowel resection x2,  28 French Stamm gastrostomy tube placement. 12/11/11 Dr. Dwain Sarna.  Hospital Course:HPI: this patient presents today for evaluation of a one-week history of nausea vomiting and abdominal pain. He states that approximately 2 weeks ago he began having flulike symptoms of not feeling well but over the last 2 months has had chronic respiratory problems after a recent pneumonia in November. He was self treating with over-the-counter remedies and approximately 3 days ago began having left lower quadrant abdominal pain which he describes as very sharp. He did get some relief of his abdominal pain with passing gas. Yesterday he began having nausea and vomiting and abdominal distention and states that he cannot even keep down water. He has had some flatus and small bowel movement this morning. He's had some fevers and chills although he is not recorded a temperature. He states that he  has had some blood in the stools although he was Hemoccult negative in the emergency room today he denies any prior history of a colonoscopy. Pt was admitted and had a drain placed as noted above, 11/26/11.  He had a  Persistent SBO/ileus with this.  He also had some pulmonary issues, with his COPD.  He was treated with IV antibiotics without resolution.  On 12/04/11 he had the drains repositioned by IR.  Even with this and continued antibiotics he did not show improvement. On 12/11/11 he was taken to the OR by DR. Wakefield.  He has had resolution of his ileus, and advanced to a full diet.  His colostomy is working well.  His abdominal incision is healing well.  Drains are out and sealed.  Family and Zachary Walls have decided on SNF for discharge.  He is doing well.  He has some anxiety issues with the colostomy, but knows how to use it.  He is on wet to dry dressings for abdominal wound, using NS He will follow up with DR. Wakefield in 2 weeks. He is to contact his family practice doctor for his hypertension and COPD treatment.  CONDITION ON D/C:  iMPROVED  Disposition: Home-Health Care Svc   Medication List  As of 12/26/2011 12:36 PM   STOP taking these medications         ibuprofen 800 MG tablet         TAKE these medications         acetaminophen 325 MG tablet   Commonly known as: TYLENOL   Take 2 tablets (650 mg total) by mouth every 6 (six) hours as needed for pain.      albuterol-ipratropium  18-103 MCG/ACT inhaler   Commonly known as: COMBIVENT   Inhale 2 puffs into the lungs every 6 (six) hours.      feeding supplement Liqd   Take 237 mLs by mouth 3 (three) times daily with meals.      Flora-Q Caps   Take 1 capsule by mouth daily.      LORazepam 0.5 MG tablet   Commonly known as: ATIVAN   Take 1 tablet (0.5 mg total) by mouth every 6 (six) hours as needed for anxiety.      oxyCODONE-acetaminophen 5-325 MG per tablet   Commonly known as: PERCOCET   Take 1-3 tablets by mouth  every 4 (four) hours as needed.      psyllium 95 % Pack   Commonly known as: HYDROCIL/METAMUCIL   Take 1 packet by mouth 2 (two) times daily.           Follow-up Information    Follow up with [facility], MD. Schedule an appointment as soon as possible for a visit in 2 weeks. (Call if you have problems)    Contact information:   [facility] [address] [place] [address] [phone]       Please follow up. (Contact your family practice doctor for follow up of your medical problems.)          Signed: Sherrie George 12/26/2011, 12:36 PM

## 2011-12-21 NOTE — Progress Notes (Signed)
Patient ID: Zachary Walls, male   DOB: October 25, 1955, 56 y.o.   MRN: [id number] 10 Days Post-Op  Subjective: Pt continues to progress well.  He is tolerating his full liquids without any nausea.  He does get full fast, but is not pushing it.  Abdominal pain continues to improve daily.  Objective: Vital signs in last 24 hours: Temp:  [98.1 F (36.7 C)-98.3 F (36.8 C)] 98.1 F (36.7 C) (03/01 0605) Pulse Rate:  [72-75] 74  (03/01 0605) Resp:  [16-18] 18  (03/01 0605) BP: (96-107)/(48-61) 107/55 mmHg (03/01 0605) SpO2:  [95 %-100 %] 98 % (03/01 0807) Last BM Date: 12/20/11  Intake/Output from previous day: 02/28 0701 - 03/01 0700 In: 4745.8 [P.O.:1454; I.V.:1225.2; TPN:2066.5] Out: 5754 [Urine:3815; Drains:214; Stool:1725] Intake/Output this shift: Total I/O In: 240 [P.O.:240] Out: 425 [Urine:425]  PE: Abd: soft, minimally tender, JPs with serous output.  Wound is clean.  Ostomy is intact with feculent output.  Lab Results:  No results found for this basename: WBC:2,HGB:2,HCT:2,PLT:2 in the last 72 hours BMET  Basename 12/21/11 0750 12/20/11 0635  NA 131* 132*  K 4.1 4.2  CL 95* 96  CO2 30 30  GLUCOSE 103* 105*  BUN 11 12  CREATININE 0.70 0.63  CALCIUM 8.9 8.4   PT/INR No results found for this basename: LABPROT:2,INR:2 in the last 72 hours   Studies/Results: No results found.  Anti-infectives: Anti-infectives     Start     Dose/Rate Route Frequency Ordered Stop   12/11/11 0900   ertapenem (INVANZ) 1 g in sodium chloride 0.9 % 50 mL IVPB        1 g 100 mL/hr over 30 Minutes Intravenous Every 24 hours 12/11/11 0729 12/18/11 0905   12/01/11 1100   fluconazole (DIFLUCAN) IVPB 400 mg  Status:  Discontinued        400 mg 200 mL/hr over 60 Minutes Intravenous Daily 12/01/11 0937 12/12/11 0755   11/24/11 1800   metroNIDAZOLE (FLAGYL) IVPB 500 mg  Status:  Discontinued        500 mg 100 mL/hr over 60 Minutes Intravenous Every 8 hours 11/24/11 1506 12/11/11 0729   11/24/11 1509   ciprofloxacin (CIPRO) IVPB 400 mg  Status:  Discontinued        400 mg 200 mL/hr over 60 Minutes Intravenous Every 12 hours 11/24/11 1506 12/11/11 0729   11/24/11 1000   ciprofloxacin (CIPRO) IVPB 400 mg        400 mg 200 mL/hr over 60 Minutes Intravenous  Once 11/24/11 0950 11/24/11 1106   11/24/11 1000   metroNIDAZOLE (FLAGYL) IVPB 500 mg        500 mg 100 mL/hr over 60 Minutes Intravenous  Once 11/24/11 0950 11/24/11 1212   11/24/11 0815   cefTRIAXone (ROCEPHIN) 1 g in dextrose 5 % 50 mL IVPB        1 g 100 mL/hr over 30 Minutes Intravenous  Once 11/24/11 0800 11/24/11 0852           Assessment/Plan  1. S/p Hartmann's with SBR x2 2. Post op ileus, resolving 3. Deconditioning, improving 4. PCM/TNA  Plan: 1. Wean TNA to off after this bag. 2. Continue on full liquids today, likely advance to solid diet tomorrow. 3. Patient needs to decide whether he wants [facility] or SNF.  PT has said he could choose which he wants.  He will need a 3 n 1 HH RN for dressing changes and PT/OT at home if he choose home.  He will also need RN for ostomy care.  He would like to know if he qualifies for a hospital bed, so I will have CM come see him. 4. Dr. Dwain Sarna wants to go slow with his drains, so I would leave them in place for now.    LOS: 27 days    Anelle Parlow E 12/21/2011

## 2011-12-21 NOTE — Progress Notes (Signed)
Soft, nt, nd.  Drains-serous. Liquid stool in ostomy  Wean TPN today Solid food Saturday Ostomy teaching Watch ostomy output - 1.1L wed, 1.7L thurs; will add some fiber to thicken it up.  Zachary Walls. Andrey Campanile, MD, FACS General, Bariatric, & Minimally Invasive Surgery [facility], [place]

## 2011-12-21 NOTE — Progress Notes (Signed)
PARENTERAL NUTRITION CONSULT NOTE - Follow-Up  Pharmacy Consult for TNA Indication: Diverticulitis w/abscess, partial SBO, s/p colectomy/colostomy  Allergies  Allergen Reactions  . Codeine Rash    Patient Measurements: Height: 5\' 9"  (175.3 cm) Weight: 122 lb 5.7 oz (55.5 kg) IBW/kg (Calculated) : 70.7   Vital Signs: Temp: 98.1 F (36.7 C) (03/01 0605) Temp src: Oral (03/01 0605) BP: 107/55 mmHg (03/01 0605) Pulse Rate: 74  (03/01 0605)  Labs:  Basename 12/21/11 0750 12/20/11 0635 12/19/11 0600  NA 131* 132* 131*  K 4.1 4.2 4.4  CL 95* 96 95*  CO2 30 30 33*  GLUCOSE 103* 105* 108*  BUN 11 12 13   CREATININE 0.70 0.63 0.64  LABCREA -- -- --  CREAT24HRUR -- -- --  CALCIUM 8.9 8.4 8.8  MG -- 1.9 --  PHOS 5.1* 4.9* --  PROT -- 6.1 --  ALBUMIN -- 1.9* --  AST -- 59* --  ALT -- 92* --  ALKPHOS -- 135* --  BILITOT -- 0.2* --  BILIDIR -- -- --  IBILI -- -- --  PREALBUMIN -- -- --  TRIG -- -- --  CHOLHDL -- -- --  CHOL -- -- --    Estimated Creatinine Clearance: 81.9 ml/min (by C-G formula based on Cr of 0.7).   Nutritional Goals:  Per RD assessment 11/28/11:  Kcal:1800-2050  Protein:80-95g  TNA Clinimix E5/15 at goal rate of 2ml/hr plus Lipids on MWF to provide 96g/d protein and 1850 Kcal/d on lipid days, 1360 Kcal/d on non-lipid days (avg 1570 KCal/d over a week).  Current nutrition:   Clinimix E 5/15@80ml /hr  Full liquids started today + Ensure TID added.  CBGs & Insulin requirements past 24 hours:  Sliding scale insulin was DC'd 12/09/11. CBGs 103-105.  IVF:  NS at 53ml/hr  Assessment:   56 y/o M with diverticulitis/abscess and superimposed SBO.  S/p  sigmoid colectomy, colostomy, small bowel resx x2 w/primary anastamosis 2/19 with post op ileus, now resolving.  TNA started 2/6, advanced to goal rate of 80 ml/hr and formulation changed to Clinimix 5/15 due to elevated AST/ALT.  LFTs now improving.  FL diet - eating about 50%. Had one Ensure this am.  Denies nausea.  Na remains low.  Potassium stable.  Phos elevated and rising slowly. SCr wnl.  Plan:   Change TNA to Clinimix 5/15(no electrolytes), cont at goal rate of 80 ml/hr. Hopefully weaning soon.  No lipids tonight d/t to possibly contributing to elevated phosphorous.  Continue thiamine 100mg  daily in each 2L TNA.  MVI and trace elements MWF only due to ongoing national shortage  Add KCl to IVF since removing K from TNA.   Bmet, Phos tomorrow.  Follow tolerance of diet and plans for TNA weaning when appropriate.  Charolotte Eke, PharmD, pager [phone]. 12/21/2011,8:49 AM.

## 2011-12-22 LAB — BASIC METABOLIC PANEL
BUN: 8 mg/dL (ref 6–23)
CO2: 33 mEq/L — ABNORMAL HIGH (ref 19–32)
Calcium: 8.7 mg/dL (ref 8.4–10.5)
Chloride: 100 mEq/L (ref 96–112)
Creatinine, Ser: 0.81 mg/dL (ref 0.50–1.35)
GFR calc Af Amer: >90 mL/min (ref >90)

## 2011-12-22 LAB — PHOSPHORUS: Phosphorus: 5.2 mg/dL — ABNORMAL HIGH (ref 2.3–4.6)

## 2011-12-22 NOTE — Progress Notes (Signed)
Patient ID: Zachary Walls, male   DOB: Aug 26, 1956, 56 y.o.   MRN: [id number] 11 Days Post-Op  Subjective: No C/O.  Tol FL.  Not much appetite  Objective: Vital signs in last 24 hours: Temp:  [98.1 F (36.7 C)-100.5 F (38.1 C)] 98.1 F (36.7 C) (03/02 0619) Pulse Rate:  [72-87] 72  (03/02 0619) Resp:  [18] 18  (03/02 0619) BP: (101-124)/(52-63) 101/58 mmHg (03/02 0619) SpO2:  [88 %-98 %] 88 % (03/02 0756) Last BM Date: 12/21/11  Intake/Output from previous day: 03/01 0701 - 03/02 0700 In: 2232.5 [P.O.:1200; I.V.:1032.5] Out: 6005 [Urine:5225; Drains:130; Stool:650] Intake/Output this shift:    General appearance: alert, cooperative and no distress GI: normal findings: soft, non-tender Incision/Wound:Dresses and clean, JPs with minimal serous output  Lab Results:  No results found for this basename: WBC:2,HGB:2,HCT:2,PLT:2 in the last 72 hours BMET  Zachary Walls 12/22/11 0350 12/21/11 0750  NA 136 131*  K 4.1 4.1  CL 100 95*  CO2 33* 30  GLUCOSE 87 103*  BUN 8 11  CREATININE 0.81 0.70  CALCIUM 8.7 8.9     Studies/Results: No results found.  Anti-infectives: Anti-infectives     Start     Dose/Rate Route Frequency Ordered Stop   12/11/11 0900   ertapenem (INVANZ) 1 g in sodium chloride 0.9 % 50 mL IVPB        1 g 100 mL/hr over 30 Minutes Intravenous Every 24 hours 12/11/11 0729 12/18/11 0905   12/01/11 1100   fluconazole (DIFLUCAN) IVPB 400 mg  Status:  Discontinued        400 mg 200 mL/hr over 60 Minutes Intravenous Daily 12/01/11 0937 12/12/11 0755   11/24/11 1800   metroNIDAZOLE (FLAGYL) IVPB 500 mg  Status:  Discontinued        500 mg 100 mL/hr over 60 Minutes Intravenous Every 8 hours 11/24/11 1506 12/11/11 0729   11/24/11 1509   ciprofloxacin (CIPRO) IVPB 400 mg  Status:  Discontinued        400 mg 200 mL/hr over 60 Minutes Intravenous Every 12 hours 11/24/11 1506 12/11/11 0729   11/24/11 1000   ciprofloxacin (CIPRO) IVPB 400 mg        400 mg 200  mL/hr over 60 Minutes Intravenous  Once 11/24/11 0950 11/24/11 1106   11/24/11 1000   metroNIDAZOLE (FLAGYL) IVPB 500 mg        500 mg 100 mL/hr over 60 Minutes Intravenous  Once 11/24/11 0950 11/24/11 1212   11/24/11 0815   cefTRIAXone (ROCEPHIN) 1 g in dextrose 5 % 50 mL IVPB        1 g 100 mL/hr over 30 Minutes Intravenous  Once 11/24/11 0800 11/24/11 0852          Assessment/Plan: s/p Procedure(s): COLOSTOMY CYSTOSCOPY WITH STENT REPLACEMENT COLON RESECTION SIGMOID SMALL BOWEL RESECTION INCISION AND DRAINAGE ABSCESS GASTROSTOMY TUBE Doing well Advance diet Drains with minimal output-D/C   LOS: 28 days    Zachary Walls T 12/22/2011  fg

## 2011-12-22 NOTE — Progress Notes (Signed)
CSW received referral from [facility], Terri re: discharge planning. Noted PT recommended home health vs. SNF. CSW spoke with daughter, Baird Lyons (cell#: [phone]) re: SNF. Daughter agrees with SNF plan, FL2 completed and faxed out to [place], [facility]. CSW will follow up with bed offers when available.   Unice Bailey, [place] [phone]  Weekend Coverage

## 2011-12-23 NOTE — Progress Notes (Signed)
Patient ID: Zachary Walls, male   DOB: 02/02/1956, 56 y.o.   MRN: [id number] 12 Days Post-Op  Subjective: No complaints today. He now has his hearing aid and can hear. He is tolerating a regular diet and has a good appetite.  Objective: Vital signs in last 24 hours: Temp:  [98.3 F (36.8 C)-99.3 F (37.4 C)] 99.3 F (37.4 C) (03/03 0524) Pulse Rate:  [69-86] 86  (03/03 0524) Resp:  [18] 18  (03/03 0524) BP: (95-108)/(57-69) 105/69 mmHg (03/03 0524) SpO2:  [92 %-97 %] 92 % (03/03 0814) Last BM Date: 12/23/11  Intake/Output from previous day: 03/02 0701 - 03/03 0700 In: 2363.3 [P.O.:1140; I.V.:1203.3] Out: 2355 [Urine:1950; Drains:55; Stool:350] Intake/Output this shift:    General appearance: alert and no distress GI: normal findings: soft, non-tender Incision/Wound: drains out yesterday. Wound is all clean granulation. The stoma okay.  Lab Results:  No results found for this basename: WBC:2,HGB:2,HCT:2,PLT:2 in the last 72 hours BMET  [facility] 12/22/11 0350 12/21/11 0750  NA 136 131*  K 4.1 4.1  CL 100 95*  CO2 33* 30  GLUCOSE 87 103*  BUN 8 11  CREATININE 0.81 0.70  CALCIUM 8.7 8.9     Studies/Results: No results found.  Anti-infectives: Anti-infectives     Start     Dose/Rate Route Frequency Ordered Stop   12/11/11 0900   ertapenem (INVANZ) 1 g in sodium chloride 0.9 % 50 mL IVPB        1 g 100 mL/hr over 30 Minutes Intravenous Every 24 hours 12/11/11 0729 12/18/11 0905   12/01/11 1100   fluconazole (DIFLUCAN) IVPB 400 mg  Status:  Discontinued        400 mg 200 mL/hr over 60 Minutes Intravenous Daily 12/01/11 0937 12/12/11 0755   11/24/11 1800   metroNIDAZOLE (FLAGYL) IVPB 500 mg  Status:  Discontinued        500 mg 100 mL/hr over 60 Minutes Intravenous Every 8 hours 11/24/11 1506 12/11/11 0729   11/24/11 1509   ciprofloxacin (CIPRO) IVPB 400 mg  Status:  Discontinued        400 mg 200 mL/hr over 60 Minutes Intravenous Every 12 hours 11/24/11 1506  12/11/11 0729   11/24/11 1000   ciprofloxacin (CIPRO) IVPB 400 mg        400 mg 200 mL/hr over 60 Minutes Intravenous  Once 11/24/11 0950 11/24/11 1106   11/24/11 1000   metroNIDAZOLE (FLAGYL) IVPB 500 mg        500 mg 100 mL/hr over 60 Minutes Intravenous  Once 11/24/11 0950 11/24/11 1212   11/24/11 0815   cefTRIAXone (ROCEPHIN) 1 g in dextrose 5 % 50 mL IVPB        1 g 100 mL/hr over 30 Minutes Intravenous  Once 11/24/11 0800 11/24/11 4098          Assessment/Plan: s/p Procedure(s): COLOSTOMY CYSTOSCOPY WITH STENT REPLACEMENT COLON RESECTION SIGMOID SMALL BOWEL RESECTION INCISION AND DRAINAGE ABSCESS GASTROSTOMY TUBE Doing very well. Is ready for discharge when SNF available.   LOS: 29 days    Jaeda Bruso T 12/23/2011

## 2011-12-24 ENCOUNTER — Encounter (HOSPITAL_COMMUNITY): Payer: Self-pay | Admitting: General Surgery

## 2011-12-24 LAB — CBC
Hemoglobin: 8.6 g/dL — ABNORMAL LOW (ref 13.0–17.0)
MCH: 28.7 pg (ref 26.0–34.0)
MCHC: 32.8 g/dL (ref 30.0–36.0)
MCV: 87.3 fL (ref 78.0–100.0)
RBC: 3 MIL/uL — ABNORMAL LOW (ref 4.22–5.81)

## 2011-12-24 NOTE — Progress Notes (Signed)
Physical Therapy Discharge Summary Patient Name: Zachary Walls Date: 12/24/2011 Pt seen by physical therapist ambulating in hallway pushing IV pole at modified independent level this morning.  Spoke with pt at bedside about receiving PT in acute care, and pt declines acute PT services at this time stating, "I get up by myself and walk around just fine."  Pt hesitant about receiving follow up PT and d/c plans stating, "once they give me the paperwork I'll finally know where I'm going."  Recommend pt receive follow up PT whether SNF or HH (depending on family's ability to assist at home) for improving endurance and strength.  PT to sign off.  Zenovia Jarred, PT Pager: [phone]

## 2011-12-24 NOTE — Consult Note (Signed)
WOC ostomy consult  Patient independent in pouch emptying.  Pouch applied on Thursday, 2/28 was intact;, however patient requested it be changed.  Nurse instructed that pouches are to stay in place for 3-4 days and that his are to be changed every Tuesday and Thursday unless leakage occurs. Patient established with [facility] post acute care ostomy sampling program. We will continue to follow and change pouch again tomorrow (Tuesday), per schedule. Thanks, Ladona Mow, MSN, RN, [facility], CWOCN [phone])

## 2011-12-24 NOTE — Progress Notes (Signed)
Nutrition Follow-up  Diet Order: Regular, intake 75-100% of meals, and at least 1 Ensure Clinical Strength Daily  - TNA d/c on 3/1. Pt reports no problems in appetite, but does c/o bloating and stomach pain if he tries to eat a lot at one time. Encouraged small frequent meals. Pt c/o not liking taste of Metamucil with juice, notified RN.    Meds: Scheduled Meds:   . albuterol-ipratropium  2 puff Inhalation TID  . alteplase  2 mg Intracatheter Once  . feeding supplement  237 mL Oral TID WC  . heparin subcutaneous  5,000 Units Subcutaneous Q8H  . lip balm  1 application Topical BID  . pantoprazole (PROTONIX) IV  40 mg Intravenous QHS  . psyllium  1 packet Oral BID   Continuous Infusions:   . sodium chloride 50 mL/hr at 12/24/11 0110   PRN Meds:.albuterol, ketorolac, LORazepam, ondansetron, oxyCODONE-acetaminophen, phenol, sodium chloride  Labs:  CMP     Component Value Date/Time   NA 136 12/22/2011 0350   K 4.1 12/22/2011 0350   CL 100 12/22/2011 0350   CO2 33* 12/22/2011 0350   GLUCOSE 87 12/22/2011 0350   BUN 8 12/22/2011 0350   CREATININE 0.81 12/22/2011 0350   CALCIUM 8.7 12/22/2011 0350   PROT 6.1 12/20/2011 0635   ALBUMIN 1.9* 12/20/2011 0635   AST 59* 12/20/2011 0635   ALT 92* 12/20/2011 0635   ALKPHOS 135* 12/20/2011 0635   BILITOT 0.2* 12/20/2011 0635   GFRNONAA >90 12/22/2011 0350   GFRAA >90 12/22/2011 0350   - Noted improvement in LFTs from AST/ALT of 129/146 on 2/25 to 59/92 on 2/27 - 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 TNA to meet >90%  of estimated needs - not met, TNA d/c.  2. Improvement in LFTs - met, LFTs trending down.  3. Diet advancement - met, pt on regular diet.   Intervention: Encouraged continued excellent intake. No new nutrition diagnosis or goals. Signing off. Will continue to monitor intake and be available as needed.    Marshall Cork Pager #: [phone]

## 2011-12-24 NOTE — Progress Notes (Signed)
FL2 in shadow chart for MD signature. CSW met with pt/spoke to daughter, Baird Lyons [phone] )  today to assist with d/c planning. SNF bed offers provided to family. They will tour SNF's and contact CSW with their choice. Medicaid prior approval is needed. Once FL2 is signed by MD CSW will request medicaid approval. Wil follow to assist with d/c planning to SNF.

## 2011-12-24 NOTE — Progress Notes (Signed)
13 Days Post-Op  Subjective: Afebrile VSS, like ativan for anxiety, tolerating full diet, colostomy working well.  Says he doesn't know how to do Ostomy, so he's going to need allow of help there;  Looking for SNF.   Objective: Vital signs in last 24 hours: Temp:  [98.4 F (36.9 C)-98.9 F (37.2 C)] 98.6 F (37 C) (03/04 0609) Pulse Rate:  [71-76] 71  (03/04 0609) Resp:  [16-18] 16  (03/04 0609) BP: (92-110)/(53-69) 107/64 mmHg (03/04 0609) SpO2:  [92 %-97 %] 93 % (03/04 0833) Last BM Date: 12/24/11  Intake/Output from previous day: 03/03 0701 - 03/04 0700 In: 1780 [P.O.:600; I.V.:1180] Out: 1050 [Urine:850; Stool:200] Intake/Output this shift: Total I/O In: 240 [P.O.:240] Out: 400 [Urine:100; Stool:300]  PE:  Alert, Still HOH  No acute complaints, lots of smaller complaints.  Lab Results:   [facility] 12/24/11 0525  WBC 10.3  HGB 8.6*  HCT 26.2*  PLT 483*    BMET  Basename 12/22/11 0350  NA 136  K 4.1  CL 100  CO2 33*  GLUCOSE 87  BUN 8  CREATININE 0.81  CALCIUM 8.7     Lab 12/20/11 0635 12/18/11 0545  AST 59* 129*  ALT 92* 146*  ALKPHOS 135* 143*  BILITOT 0.2* 0.3  PROT 6.1 6.0  ALBUMIN 1.9* 1.7*    PT/INR No results found for this basename: LABPROT:2,INR:2 in the last 72 hours   Studies/Results: No results found.  Anti-infectives: Anti-infectives     Start     Dose/Rate Route Frequency Ordered Stop   12/11/11 0900   ertapenem (INVANZ) 1 g in sodium chloride 0.9 % 50 mL IVPB        1 g 100 mL/hr over 30 Minutes Intravenous Every 24 hours 12/11/11 0729 12/18/11 0905   12/01/11 1100   fluconazole (DIFLUCAN) IVPB 400 mg  Status:  Discontinued        400 mg 200 mL/hr over 60 Minutes Intravenous Daily 12/01/11 0937 12/12/11 0755   11/24/11 1800   metroNIDAZOLE (FLAGYL) IVPB 500 mg  Status:  Discontinued        500 mg 100 mL/hr over 60 Minutes Intravenous Every 8 hours 11/24/11 1506 12/11/11 0729   11/24/11 1509   ciprofloxacin (CIPRO) IVPB  400 mg  Status:  Discontinued        400 mg 200 mL/hr over 60 Minutes Intravenous Every 12 hours 11/24/11 1506 12/11/11 0729   11/24/11 1000   ciprofloxacin (CIPRO) IVPB 400 mg        400 mg 200 mL/hr over 60 Minutes Intravenous  Once 11/24/11 0950 11/24/11 1106   11/24/11 1000   metroNIDAZOLE (FLAGYL) IVPB 500 mg        500 mg 100 mL/hr over 60 Minutes Intravenous  Once 11/24/11 0950 11/24/11 1212   11/24/11 0815   cefTRIAXone (ROCEPHIN) 1 g in dextrose 5 % 50 mL IVPB        1 g 100 mL/hr over 30 Minutes Intravenous  Once 11/24/11 0800 11/24/11 0865         Current Facility-Administered Medications  Medication Dose Route Frequency Provider Last Rate Last Dose  . 0.9 %  sodium chloride infusion   Intravenous Continuous Letha Cape, PA 50 mL/hr at 12/24/11 0110    . albuterol (PROVENTIL) (5 MG/ML) 0.5% nebulizer solution 2.5 mg  2.5 mg Nebulization Q6H PRN Ardeth Sportsman, MD   2.5 mg at 12/19/11 0038  . albuterol-ipratropium (COMBIVENT) inhaler 2 puff  2 puff Inhalation  TID Atilano Ina, MD,FACS   2 puff at 12/24/11 [phone]  . alteplase (CATHFLO ACTIVASE) injection 2 mg  2 mg Intracatheter Once Emelia Loron, MD      . feeding supplement (ENSURE CLINICAL STRENGTH) liquid 237 mL  237 mL Oral TID WC Marshall Cork, RD   237 mL at 12/24/11 0820  . heparin injection 5,000 Units  5,000 Units Subcutaneous Q8H Letha Cape, PA   5,000 Units at 12/24/11 0604  . ketorolac (TORADOL) 15 MG/ML injection 15 mg  15 mg Intravenous Q6H PRN Letha Cape, PA   15 mg at 12/24/11 0604  . lip balm (CARMEX) ointment 1 application  1 application Topical BID Ardeth Sportsman, MD   1 application at 12/24/11 [phone]  . LORazepam (ATIVAN) injection 1 mg  1 mg Intravenous Q6H PRN Velora Heckler, MD   1 mg at 12/24/11 0403  . ondansetron (ZOFRAN) injection 4 mg  4 mg Intravenous Q6H PRN Rulon Abide, DO   4 mg at 12/24/11 0100  . oxyCODONE-acetaminophen (PERCOCET) 5-325 MG per tablet 1-2  tablet  1-2 tablet Oral Q4H PRN Letha Cape, PA   2 tablet at 12/24/11 [phone]  . pantoprazole (PROTONIX) injection 40 mg  40 mg Intravenous QHS Emelia Loron, MD   40 mg at 12/23/11 2247  . phenol (CHLORASEPTIC) mouth spray 1 spray  1 spray Mouth/Throat PRN Wilmon Arms. Tsuei, MD   1 spray at 11/26/11 1735  . psyllium (HYDROCIL/METAMUCIL) packet 1 packet  1 packet Oral BID Atilano Ina, MD,FACS   1 packet at 12/24/11 [phone]  . sodium chloride 0.9 % injection 10-40 mL  10-40 mL Intracatheter PRN Emelia Loron, MD   10 mL at 12/24/11 0525    Assessment/Plan 1. S/p Hartmann's with SBR x2 INCISION AND DRAINAGE ABSCESS,GASTROSTOMY TUBE  Exploratory laparotomy with LOA x 1 hour   Sigmoid colectomy, end colostomy  . 28 Fr Stamm gastrostomy tube placement  . Appendectomy   Small bowel resection times two with primary anastomosis      2. Post op ileus, resolving  3. Deconditioning, improving  4. PCM/TNA   5.   CYSTOSCOPY WITH STENT REPLACEMENT  Plan:  Continue to mobilize, looking for SNF, I'm not sure when to get gastrostomy tube out.  Will discuss. [name] LFT'S TOmorrow.    LOS: 30 days    Zachary Walls 12/24/2011

## 2011-12-24 NOTE — Progress Notes (Signed)
Occupational Therapy Treatment Patient Details Name: Zachary Walls MRN: [id number] DOB: 07/13/1956 Today's Date: 12/24/2011  OT Assessment/Plan OT Assessment/Plan Comments on Treatment Session: Pt has met and exceeded all goals as stated on initial eval. OT Plan: All goals met and education completed, patient discharged from OT services Follow Up Recommendations: No OT follow up Equipment Recommended: None recommended by OT OT Goals ADL Goals ADL Goal: Toilet Transfer - Progress: Met ADL Goal: Toileting - Clothing Manipulation - Progress: Met ADL Goal: Toileting - Hygiene - Progress: Met Miscellaneous OT Goals OT Goal: Miscellaneous Goal #1 - Progress: Met OT Goal: Miscellaneous Goal #2 - Progress: Met  OT Treatment Precautions/Restrictions      ADL ADL Grooming: Performed;Wash/dry hands;Wash/dry face;Teeth care;Brushing hair;Modified independent Where Assessed - Grooming: Standing at sink Upper Body Bathing: Performed;Modified independent Where Assessed - Upper Body Bathing: Standing at sink Lower Body Bathing: Performed;Modified independent Where Assessed - Lower Body Bathing: Sit to stand from bed Upper Body Dressing: Performed;Modified independent Where Assessed - Upper Body Dressing: Standing Lower Body Dressing: Performed;Modified independent Where Assessed - Lower Body Dressing: Sit to stand from bed Toilet Transfer: Simulated;Modified independent Toilet Transfer Method: Proofreader: Other (comment) (sim to chair.) Toileting - Clothing Manipulation: Performed;Modified independent Where Assessed - Toileting Clothing Manipulation: Standing Toileting - Hygiene: Performed;Modified independent Where Assessed - Toileting Hygiene: Standing (Pt utilized urinal X 2.) Tub/Shower Transfer: Not assessed ADL Comments: mod I status due to increased time needed. Pt was even able to carry full basin of water to the sink and empty it out. Per nursing and pt, pt  is able to empty his colostomy bag without difficulty. Mobility  Bed Mobility Bed Mobility: Yes Supine to Sit: 7: Independent;HOB flat Transfers Sit to Stand: 6: Modified independent (Device/Increase time);From bed;From chair/3-in-1;Without upper extremity assist Stand to Sit: 6: Modified independent (Device/Increase time);To chair/3-in-1;Without upper extremity assist;With armrests Exercises    End of Session OT - End of Session Activity Tolerance: Patient tolerated treatment well Patient left: in chair;with call bell in reach General Behavior During Session: [facility] for tasks performed Cognition: [facility] for tasks performed  Sarra Rachels A OTR/L [phone] 12/24/2011, 11:58 AM

## 2011-12-25 LAB — COMPREHENSIVE METABOLIC PANEL
ALT: 40 U/L (ref 0–53)
AST: 24 U/L (ref 0–37)
Albumin: 1.8 g/dL — ABNORMAL LOW (ref 3.5–5.2)
Alkaline Phosphatase: 101 U/L (ref 39–117)
CO2: 29 mEq/L (ref 19–32)
Calcium: 7.9 mg/dL — ABNORMAL LOW (ref 8.4–10.5)
Chloride: 102 mEq/L (ref 96–112)
Creatinine, Ser: 0.88 mg/dL (ref 0.50–1.35)
GFR calc Af Amer: >90 mL/min (ref >90)
GFR calc non Af Amer: >90 mL/min (ref >90)
Sodium: 136 mEq/L (ref 135–145)

## 2011-12-25 LAB — PREALBUMIN: Prealbumin: 9.1 mg/dL — ABNORMAL LOW (ref 17.0–34.0)

## 2011-12-25 MED ORDER — PANTOPRAZOLE SODIUM 40 MG PO TBEC
40.0000 mg | DELAYED_RELEASE_TABLET | Freq: Every day | ORAL | Status: DC
  Administered 2011-12-25 – 2011-12-26 (×2): 40 mg via ORAL
  Filled 2011-12-25 (×3): qty 1

## 2011-12-25 MED ORDER — FLORA-Q PO CAPS
1.0000 | ORAL_CAPSULE | Freq: Every day | ORAL | Status: DC
  Administered 2011-12-25 – 2011-12-26 (×2): 1 via ORAL
  Filled 2011-12-25 (×3): qty 1

## 2011-12-25 MED ORDER — BISACODYL 10 MG RE SUPP: 10.0000 mg | Freq: Two times a day (BID) | RECTAL | Status: DC | PRN

## 2011-12-25 MED ORDER — LORAZEPAM 2 MG/ML IJ SOLN
1.0000 mg | Freq: Two times a day (BID) | INTRAMUSCULAR | Status: DC | PRN
  Administered 2011-12-26: 1 mg via INTRAVENOUS
  Filled 2011-12-25: qty 1

## 2011-12-25 NOTE — Progress Notes (Signed)
Leave fascial stitch in place - will trim in office when more time out from surgery Working on placement Mobilize as tolerated

## 2011-12-25 NOTE — Progress Notes (Signed)
14 Days Post-Op  Subjective: Doing well all drains out except gastrostomy  Objective: Vital signs in last 24 hours: Temp:  [98.4 F (36.9 C)-99.8 F (37.7 C)] 99.8 F (37.7 C) (03/05 0600) Pulse Rate:  [73-81] 81  (03/05 0600) Resp:  [16-19] 19  (03/05 0600) BP: (105-123)/(60-78) 111/60 mmHg (03/05 0600) SpO2:  [93 %-98 %] 98 % (03/05 1008) Last BM Date: 12/25/11  Intake/Output from previous day: 03/04 0701 - 03/05 0700 In: 480 [P.O.:480] Out: 2900 [Urine:2000; Stool:900] Intake/Output this shift:    PE:  Alert, napping some.  Chest clear.  Abd:  Still has some pain at drain site but it isn't currently leaking.  Wound is clean and pink.. His suture knot is above the skin.  I'm not sure how that's going to heal.  Ostomy working well  Lab Results:   [facility] 12/24/11 0525  WBC 10.3  HGB 8.6*  HCT 26.2*  PLT 483*    BMET  Basename 12/25/11 0445  NA 136  K 3.6  CL 102  CO2 29  GLUCOSE 91  BUN 6  CREATININE 0.88  CALCIUM 7.9*   PT/INR No results found for this basename: LABPROT:2,INR:2 in the last 72 hours   Studies/Results: No results found.  Anti-infectives: Anti-infectives     Start     Dose/Rate Route Frequency Ordered Stop   12/11/11 0900   ertapenem (INVANZ) 1 g in sodium chloride 0.9 % 50 mL IVPB        1 g 100 mL/hr over 30 Minutes Intravenous Every 24 hours 12/11/11 0729 12/18/11 0905   12/01/11 1100   fluconazole (DIFLUCAN) IVPB 400 mg  Status:  Discontinued        400 mg 200 mL/hr over 60 Minutes Intravenous Daily 12/01/11 0937 12/12/11 0755   11/24/11 1800   metroNIDAZOLE (FLAGYL) IVPB 500 mg  Status:  Discontinued        500 mg 100 mL/hr over 60 Minutes Intravenous Every 8 hours 11/24/11 1506 12/11/11 0729   11/24/11 1509   ciprofloxacin (CIPRO) IVPB 400 mg  Status:  Discontinued        400 mg 200 mL/hr over 60 Minutes Intravenous Every 12 hours 11/24/11 1506 12/11/11 0729   11/24/11 1000   ciprofloxacin (CIPRO) IVPB 400 mg        400  mg 200 mL/hr over 60 Minutes Intravenous  Once 11/24/11 0950 11/24/11 1106   11/24/11 1000   metroNIDAZOLE (FLAGYL) IVPB 500 mg        500 mg 100 mL/hr over 60 Minutes Intravenous  Once 11/24/11 0950 11/24/11 1212   11/24/11 0815   cefTRIAXone (ROCEPHIN) 1 g in dextrose 5 % 50 mL IVPB        1 g 100 mL/hr over 30 Minutes Intravenous  Once 11/24/11 0800 11/24/11 4098         Current Facility-Administered Medications  Medication Dose Route Frequency Provider Last Rate Last Dose  . 0.9 %  sodium chloride infusion   Intravenous Continuous Letha Cape, PA 50 mL/hr at 12/24/11 0110    . albuterol (PROVENTIL) (5 MG/ML) 0.5% nebulizer solution 2.5 mg  2.5 mg Nebulization Q6H PRN Ardeth Sportsman, MD   2.5 mg at 12/25/11 0223  . albuterol-ipratropium (COMBIVENT) inhaler 2 puff  2 puff Inhalation TID Atilano Ina, MD,FACS   2 puff at 12/25/11 1006  . alteplase (CATHFLO ACTIVASE) injection 2 mg  2 mg Intracatheter Once Emelia Loron, MD      .  feeding supplement (ENSURE CLINICAL STRENGTH) liquid 237 mL  237 mL Oral TID WC Marshall Cork, RD   237 mL at 12/24/11 0820  . heparin injection 5,000 Units  5,000 Units Subcutaneous Q8H Letha Cape, PA   5,000 Units at 12/25/11 0539  . ketorolac (TORADOL) 15 MG/ML injection 15 mg  15 mg Intravenous Q6H PRN Letha Cape, PA   15 mg at 12/24/11 2219  . lip balm (CARMEX) ointment 1 application  1 application Topical BID Ardeth Sportsman, MD   1 application at 12/25/11 1000  . LORazepam (ATIVAN) injection 1 mg  1 mg Intravenous Q6H PRN Velora Heckler, MD   1 mg at 12/25/11 0755  . ondansetron (ZOFRAN) injection 4 mg  4 mg Intravenous Q6H PRN Rulon Abide, DO   4 mg at 12/25/11 0539  . oxyCODONE-acetaminophen (PERCOCET) 5-325 MG per tablet 1-2 tablet  1-2 tablet Oral Q4H PRN Letha Cape, PA   2 tablet at 12/25/11 1028  . pantoprazole (PROTONIX) injection 40 mg  40 mg Intravenous QHS Emelia Loron, MD   40 mg at 12/24/11 2133  .  phenol (CHLORASEPTIC) mouth spray 1 spray  1 spray Mouth/Throat PRN Wilmon Arms. Tsuei, MD   1 spray at 11/26/11 1735  . psyllium (HYDROCIL/METAMUCIL) packet 1 packet  1 packet Oral BID Atilano Ina, MD,FACS   1 packet at 12/24/11 2218  . sodium chloride 0.9 % injection 10-40 mL  10-40 mL Intracatheter PRN Emelia Loron, MD   10 mL at 12/24/11 1812    Assessment/Plan 1. S/p Hartmann's with SBR x2 INCISION AND DRAINAGE ABSCESS,GASTROSTOMY TUBE  Exploratory laparotomy with LOA x 1 hour  Sigmoid colectomy, end colostomy  . 28 Fr Stamm gastrostomy tube placement  . Appendectomy  Small bowel resection times two with primary anastomosis  2. Post op ileus, resolving  3. Deconditioning, improving  4. PCM/TNA  5. CYSTOSCOPY WITH STENT REPLACEMENT    Plan:  SNF  Forms in [facility] for signing.     LOS: 31 days    Zachary Walls 12/25/2011

## 2011-12-25 NOTE — Progress Notes (Signed)
CSW assisting with d/c planning. Bed available at [facility] once medicaid prior auth. is received. Expecting auth 3/6. D/C Summary and scripts for any narcotics will be needed on day of d/c. Will follow to assist with d/c planning to SNF. [phone] )

## 2011-12-25 NOTE — Consult Note (Signed)
WOC ostomy consult  Ostomy nurse follow-up: Staff RN changed pouch today; patient likes pouches changed every other day despite teaching that twice weekly is more the norm.  Patient taught that most insurers will not provide pouches in the quantity required for every-other-day pouch changes.  Patient assisting with pouch changes, cueing nurse throughout process.  Recommend pouch changes every Tuesday and Friday.  Emptying independently.  Ready for discharge. I will not follow.  Please re-consult if needed. Thanks, Ladona Mow, MSN, RN, [facility], CWOCN [phone])

## 2011-12-25 NOTE — Progress Notes (Signed)
Leave G tube in - M[employer] consultant to determine when safe to remove Working on placement

## 2011-12-26 MED ORDER — FLORA-Q PO CAPS: 1.0000 | ORAL_CAPSULE | Freq: Every day | ORAL | Status: DC

## 2011-12-26 MED ORDER — IPRATROPIUM-ALBUTEROL 18-103 MCG/ACT IN AERO
2.0000 | INHALATION_SPRAY | Freq: Four times a day (QID) | RESPIRATORY_TRACT | Status: DC
  Administered 2011-12-26 (×2): 2 via RESPIRATORY_TRACT
  Filled 2011-12-26: qty 14.7

## 2011-12-26 MED ORDER — OXYCODONE-ACETAMINOPHEN 5-325 MG PO TABS
1.0000 | ORAL_TABLET | ORAL | Status: DC | PRN
  Administered 2011-12-26: 3 via ORAL
  Filled 2011-12-26: qty 3

## 2011-12-26 MED ORDER — ENSURE CLINICAL ST REVIGOR PO LIQD: 237.0000 mL | Freq: Three times a day (TID) | ORAL | Status: DC

## 2011-12-26 MED ORDER — OXYCODONE-ACETAMINOPHEN 5-325 MG PO TABS: 1.0000 | ORAL_TABLET | ORAL | Status: AC | PRN

## 2011-12-26 MED ORDER — PSYLLIUM 95 % PO PACK: 1.0000 | PACK | Freq: Two times a day (BID) | ORAL | Status: DC

## 2011-12-26 MED ORDER — LORAZEPAM 0.5 MG PO TABS
0.5000 mg | ORAL_TABLET | Freq: Four times a day (QID) | ORAL | Status: DC | PRN
  Administered 2011-12-26: 0.5 mg via ORAL
  Filled 2011-12-26: qty 1

## 2011-12-26 MED ORDER — IPRATROPIUM-ALBUTEROL 18-103 MCG/ACT IN AERO: 2.0000 | INHALATION_SPRAY | Freq: Four times a day (QID) | RESPIRATORY_TRACT | Status: DC

## 2011-12-26 MED ORDER — ACETAMINOPHEN 325 MG PO TABS: 650.0000 mg | ORAL_TABLET | Freq: Four times a day (QID) | ORAL | Status: DC | PRN

## 2011-12-26 MED ORDER — LORAZEPAM 0.5 MG PO TABS: 0.5000 mg | ORAL_TABLET | Freq: Four times a day (QID) | ORAL | Status: AC | PRN

## 2011-12-26 NOTE — Discharge Instructions (Signed)
[facility]      [facility] [phone]  OPEN ABDOMINAL SURGERY: POST OP INSTRUCTIONS  Always review your discharge instruction sheet given to you by the facility where your surgery was performed.  IF YOU HAVE DISABILITY OR FAMILY LEAVE FORMS, YOU MUST BRING THEM TO THE OFFICE FOR PROCESSING.  PLEASE DO NOT GIVE THEM TO YOUR DOCTOR.  1. A prescription for pain medication may be given to you upon discharge.  Take your pain medication as prescribed, if needed.  If narcotic pain medicine is not needed, then you may take acetaminophen (Tylenol) or ibuprofen (Advil) as needed. 2. Take your usually prescribed medications unless otherwise directed. 3. If you need a refill on your pain medication, please contact your pharmacy. They will contact our office to request authorization.  Prescriptions will not be filled after 5pm or on week-ends. 4. You should follow a light diet the first few days after arrival home, such as soup and crackers, pudding, etc.unless your doctor has advised otherwise. A high-fiber, low fat diet can be resumed as tolerated.   Be sure to include lots of fluids daily. Most patients will experience some swelling and bruising on the chest and neck area.  Ice packs will help.  Swelling and bruising can take several days to resolve 5. Most patients will experience some swelling and bruising in the area of the incision. Ice pack will help. Swelling and bruising can take several days to resolve..  6. It is common to experience some constipation if taking pain medication after surgery.  Increasing fluid intake and taking a stool softener will usually help or prevent this problem from occurring.  A mild laxative (Milk of Magnesia or Miralax) should be taken according to package directions if there are no bowel movements after 48 hours. 7.  You may have steri-strips (small skin tapes) in place directly over the incision.  These strips should be left on the skin for 7-10 days.  If your  surgeon used skin glue on the incision, you may shower in 24 hours.  The glue will flake off over the next 2-3 weeks.  Any sutures or staples will be removed at the office during your follow-up visit. You may find that a light gauze bandage over your incision may keep your staples from being rubbed or pulled. You may shower and replace the bandage daily. 8. ACTIVITIES:  You may resume regular (light) daily activities beginning the next day--such as daily self-care, walking, climbing stairs--gradually increasing activities as tolerated.  You may have sexual intercourse when it is comfortable.  Refrain from any heavy lifting or straining until approved by your doctor. a. You may drive when you no longer are taking prescription pain medication, you can comfortably wear a seatbelt, and you can safely maneuver your car and apply brakes b. Return to Work: ___________________________________ 9. You should see your doctor in the office for a follow-up appointment approximately two weeks after your surgery.  Make sure that you call for this appointment within a day or two after you arrive home to insure a convenient appointment time. OTHER INSTRUCTIONS:  _____________________________________________________________ _____________________________________________________________  WHEN TO CALL YOUR DOCTOR: 1. Fever over 101.0 2. Inability to urinate 3. Nausea and/or vomiting 4. Extreme swelling or bruising 5. Continued bleeding from incision. 6. Increased pain, redness, or drainage from the incision. 7. Difficulty swallowing or breathing 8. Muscle cramping or spasms. 9. Numbness or tingling in hands or feet or around lips.  The clinic staff is available to   answer your questions during regular business hours.  Please don't hesitate to call and ask to speak to one of the nurses if you have concerns.  For further questions, please visit [email or web address]   Colostomy Home Guide A colostomy is  an opening for stool to leave your body when a medical condition prevents it from leaving through the usual opening (rectum). During a surgery, a piece of large intestine (colon) is brought through a hole in the abdominal wall. The new opening is called a stoma or ostomy. A bag or pouch fits over the stoma to catch stool and gas. Your stool may be liquid, somewhat pasty, or formed. CARING FOR YOUR STOMA  Normally, the stoma looks a lot like the inside of your cheek: pink, red, and moist. At first it may be swollen, but this swelling will decrease within 6 weeks. Keep the skin around your stoma clean and dry. You can gently wash your stoma and the skin around your stoma in the shower with a clean, soft washcloth. If you develop any skin irritation, your caregiver may give you a stoma powder or ointment to help heal the area. Do not use any products other than those specifically given to you by your caregiver.  Your stoma should not be uncomfortable. If you notice any stinging or burning, your pouch may be leaking, and the skin around your stoma may be coming into contact with stool. This can cause skin irritation. If you notice stinging, replace your pouch with a new one and discard the old one. OSTOMY POUCHES  The pouch that fits over the ostomy can be made up of either 1 or 2 pieces. A one-piece pouch has a skin barrier piece and the pouch itself in one unit. A two-piece pouch has a skin barrier with a separate pouch that snaps on and off of the skin barrier. Either way, you should empty the pouch when it is only ? to  full. Do not let more stool or gas build up. This could cause the pouch to leak. Some ostomy bags have a built-in gas release valve. Ostomy deodorizer (5 drops) can be put into the pouch to prevent odor. Some people use ostomy lubricant drops inside the pouch to help the stool slide out of the bag more easily and completely.  EMPTYING YOUR OSTOMY POUCH  You may get lessons on how to empty  your pouch from a wound-ostomy nurse before you leave the hospital. Here are the basic steps:  Wash your hands with soap and water.   Sit far back on the toilet.   Put several pieces of toilet paper into the toilet water. This will prevent splashing as you empty the stool into the toilet bowl.   Unclip or unvelcro the tail end of the pouch.   Unroll the tail and empty stool into the toilet.   Clean the tail with toilet paper.   Reroll the tail, and clip or velcro it closed.   Wash your hands again.  CHANGING YOUR OSTOMY POUCH  Change your ostomy pouch about every 3 to 4 days for the first 6 weeks, then every 5 to7 days. Always change the bag sooner if there is any leakage or you begin to notice any discomfort or irritation of the skin around the stoma. When possible, plan to change your ostomy pouch before eating or drinking as this will lessen the chance of stool coming out during the pouch change. A wound-ostomy nurse may teach you how to change   your pouch before you leave the hospital. Here are the basic steps:  Lay out your supplies.   Wash your hands with soap and water.   Carefully remove the old pouch.   Wash the stoma and allow it to dry. Men may be advised to shave any hair around the stoma very carefully. This will make the adhesive stick better.   Use the stoma measuring guide that comes with your pouch set to decide what size hole you will need to cut in the skin barrier piece. Choose the smallest possible size that will hold the stoma but will not touch it.   Use the guide to trace the circle on the back of the skin barrier piece. Cut out the hole.   Hold the skin barrier piece over the stoma to make sure the hole is the correct size.   Remove the adhesive paper backing from the skin barrier piece.   Squeeze stoma paste around the opening of the skin barrier piece.   Clean and dry the skin around the stoma again.   Carefully fit the skin barrier piece over your  stoma.   If you are using a two-piece pouch, snap the pouch onto the skin barrier piece.   Close the tail of the pouch.   Put your hand over the top of the skin barrier piece to help warm it for about 5 minutes, so that it conforms to your body better.   Wash your hands again.  DIET TIPS   Continue to follow your usual diet.   Drink about eight 8 oz glasses of water each day.   You can prevent gas by eating slowly and chewing your food thoroughly.   If you feel concerned that you have too much gas, you can cut back on gas-producing foods, such as:   Spicy foods.   Onions and garlic.   Cruciferous vegetables (cabbage, broccoli, cauliflower, Brussels sprouts).   Beans and legumes.   Some cheeses.   Eggs.   Fish.   Bubbly (carbonated) drinks.   Chewing gum.  GENERAL TIPS   You can shower with or without the bag in place.   Always keep the bag on if you are bathing or swimming.   If your bag gets wet, you can dry it with a blow-dryer set to cool.   Avoid wearing tight clothing directly over your stoma so that it does not become irritated or bleed. Tight clothing can also prevent stool from draining into the pouch.   It is helpful to always have an extra skin barrier and pouch with you when traveling. Do not leave them anywhere too warm, as parts of them can melt.   Do not let your seat belt rest on your stoma. Try to keep the seat belt either above or below your stoma, or use a tiny pillow to cushion it.   You can still participate in sports, but you should avoid activities in which there is a risk of getting hit in the abdomen.   You can still have sex. It is a good idea to empty your pouch prior to sex. Some people and their partners feel very comfortable seeing the pouch during sex. Others choose to wear lingerie or a T-shirt that covers the device.  SEEK IMMEDIATE MEDICAL CARE IF:  You notice a change in the size or color of the stoma, especially if it becomes  very red, purple, black, or pale white.   You have bloody stools or bleeding   from the stoma.   You have abdominal pain, nausea, vomiting, or bloating.   There is anything unusual protruding from the stoma.   You have irritation or red skin around the stoma.   No stool is passing from the stoma.   You have diarrhea (requiring more frequent than normal pouch emptying).  Document Released: 10/11/2003 Document Revised: 09/27/2011 Document Reviewed: 03/07/2011 [employer] Patient Information 2012 [employer]. 

## 2011-12-26 NOTE — Progress Notes (Signed)
15 Days Post-Op  Subjective: C/o of anxiety, and his nerves,  Having trouble adjusting to odors from colostomy.  Also some discomfort from gastrostomy tube.    Objective: Vital signs in last 24 hours: Temp:  [98.4 F (36.9 C)-100.2 F (37.9 C)] 100.2 F (37.9 C) (03/06 0630) Pulse Rate:  [71-80] 79  (03/06 0630) Resp:  [16-17] 16  (03/06 0630) BP: (98-108)/(54-67) 98/67 mmHg (03/06 0630) SpO2:  [93 %-95 %] 93 % (03/06 0826) Last BM Date: 12/25/11  Intake/Output from previous day: 03/05 0701 - 03/06 0700 In: 1441.9 [P.O.:1200; I.V.:241.9] Out: 3025 [Urine:2375; Stool:650] Intake/Output this shift: Total I/O In: 80 [P.O.:80] Out: 450 [Urine:350; Stool:100]  PE:  Alert, drinking Ensure, Chest: Clear  Abd: Soft, +BS, c/o pain at gastrostomy site but it looks good.  Open abd wound is healing nicely with wet to dry dressing.  Drain sites look fine.  Sealed. Colostomy is working well,.  Lab Results:   [facility] 12/24/11 0525  WBC 10.3  HGB 8.6*  HCT 26.2*  PLT 483*    Lab 12/25/11 0445 12/20/11 0635  AST 24 59*  ALT 40 92*  ALKPHOS 101 135*  BILITOT 0.2* 0.2*  PROT 5.6* 6.1  ALBUMIN 1.8* 1.9*    BMET  Basename 12/25/11 0445  NA 136  K 3.6  CL 102  CO2 29  GLUCOSE 91  BUN 6  CREATININE 0.88  CALCIUM 7.9*   PT/INR No results found for this basename: LABPROT:2,INR:2 in the last 72 hours   Studies/Results: No results found.  Anti-infectives: Anti-infectives     Start     Dose/Rate Route Frequency Ordered Stop   12/11/11 0900   ertapenem (INVANZ) 1 g in sodium chloride 0.9 % 50 mL IVPB        1 g 100 mL/hr over 30 Minutes Intravenous Every 24 hours 12/11/11 0729 12/18/11 0905   12/01/11 1100   fluconazole (DIFLUCAN) IVPB 400 mg  Status:  Discontinued        400 mg 200 mL/hr over 60 Minutes Intravenous Daily 12/01/11 0937 12/12/11 0755   11/24/11 1800   metroNIDAZOLE (FLAGYL) IVPB 500 mg  Status:  Discontinued        500 mg 100 mL/hr over 60 Minutes  Intravenous Every 8 hours 11/24/11 1506 12/11/11 0729   11/24/11 1509   ciprofloxacin (CIPRO) IVPB 400 mg  Status:  Discontinued        400 mg 200 mL/hr over 60 Minutes Intravenous Every 12 hours 11/24/11 1506 12/11/11 0729   11/24/11 1000   ciprofloxacin (CIPRO) IVPB 400 mg        400 mg 200 mL/hr over 60 Minutes Intravenous  Once 11/24/11 0950 11/24/11 1106   11/24/11 1000   metroNIDAZOLE (FLAGYL) IVPB 500 mg        500 mg 100 mL/hr over 60 Minutes Intravenous  Once 11/24/11 0950 11/24/11 1212   11/24/11 0815   cefTRIAXone (ROCEPHIN) 1 g in dextrose 5 % 50 mL IVPB        1 g 100 mL/hr over 30 Minutes Intravenous  Once 11/24/11 0800 11/24/11 9811         Current Facility-Administered Medications  Medication Dose Route Frequency Provider Last Rate Last Dose  . 0.9 %  sodium chloride infusion   Intravenous Continuous Ardeth Sportsman, MD 20 mL/hr at 12/25/11 1752 20 mL/hr at 12/25/11 1752  . albuterol (PROVENTIL) (5 MG/ML) 0.5% nebulizer solution 2.5 mg  2.5 mg Nebulization Q6H PRN Ardeth Sportsman,  MD   2.5 mg at 12/26/11 0625  . albuterol-ipratropium (COMBIVENT) inhaler 2 puff  2 puff Inhalation Q6H WA Ardeth Sportsman, MD   2 puff at 12/26/11 [phone]  . alteplase (CATHFLO ACTIVASE) injection 2 mg  2 mg Intracatheter Once Emelia Loron, MD      . bisacodyl (DULCOLAX) suppository 10 mg  10 mg Rectal Q12H PRN Ardeth Sportsman, MD      . feeding supplement (ENSURE CLINICAL STRENGTH) liquid 237 mL  237 mL Oral TID WC Marshall Cork, RD   237 mL at 12/26/11 0853  . Flora-Q (FLORA-Q) Capsule 1 capsule  1 capsule Oral Daily Ardeth Sportsman, MD   1 capsule at 12/26/11 0949  . heparin injection 5,000 Units  5,000 Units Subcutaneous Q8H Letha Cape, PA   5,000 Units at 12/26/11 0543  . ketorolac (TORADOL) 15 MG/ML injection 15 mg  15 mg Intravenous Q6H PRN Letha Cape, PA   15 mg at 12/25/11 1258  . lip balm (CARMEX) ointment 1 application  1 application Topical BID Ardeth Sportsman, MD   1 application at 12/26/11 [phone]  . LORazepam (ATIVAN) injection 1 mg  1 mg Intravenous Q12H PRN Ardeth Sportsman, MD   1 mg at 12/26/11 0234  . ondansetron (ZOFRAN) injection 4 mg  4 mg Intravenous Q6H PRN Rulon Abide, DO   4 mg at 12/26/11 0853  . oxyCODONE-acetaminophen (PERCOCET) 5-325 MG per tablet 1-2 tablet  1-2 tablet Oral Q4H PRN Letha Cape, PA   2 tablet at 12/26/11 [phone]  . pantoprazole (PROTONIX) EC tablet 40 mg  40 mg Oral Q1200 Ardeth Sportsman, MD   40 mg at 12/25/11 1752  . phenol (CHLORASEPTIC) mouth spray 1 spray  1 spray Mouth/Throat PRN Wilmon Arms. Tsuei, MD   1 spray at 11/26/11 1735  . psyllium (HYDROCIL/METAMUCIL) packet 1 packet  1 packet Oral BID Atilano Ina, MD,FACS   1 packet at 12/25/11 2221  . sodium chloride 0.9 % injection 10-40 mL  10-40 mL Intracatheter PRN Emelia Loron, MD   10 mL at 12/24/11 1812  . DISCONTD: albuterol-ipratropium (COMBIVENT) inhaler 2 puff  2 puff Inhalation TID Atilano Ina, MD,FACS   2 puff at 12/25/11 2214  . DISCONTD: LORazepam (ATIVAN) injection 1 mg  1 mg Intravenous Q6H PRN Velora Heckler, MD   1 mg at 12/25/11 1421  . DISCONTD: pantoprazole (PROTONIX) injection 40 mg  40 mg Intravenous QHS Emelia Loron, MD   40 mg at 12/24/11 2133    Assessment/Plan *Diverticulitis of large intestine with perforation/abscess  Active Problems:  SBO (small bowel obstruction)  HOH (hard of hearing)  Malnutrition  COPD  Hypertension   Plan:  He has a bed at SNF.  Will plan to send today.  I am going to change ativan to .5 mg q6h PRN INCREASE percocet to 1-3 q4h prn.  Follow up with Dr. Dwain Sarna in office      LOS: 32 days    Zachary Walls 12/26/2011

## 2011-12-26 NOTE — Progress Notes (Signed)
Patient discussed at the Long Length of Stay Vivica Dobosz Weeks 12/26/2011  

## 2011-12-26 NOTE — Progress Notes (Signed)
CSW assisting with d/c planning. Medicaid prior auth # has been received. [facility] is able to admit today for ST placement. NSG will contact MD for D/C summary  And scripts for any narcotics. Will update pt/daughter with d/c plans for today.

## 2011-12-26 NOTE — Progress Notes (Signed)
Pt d/c to [facility] today via family transport for ST placement.

## 2011-12-26 NOTE — Progress Notes (Signed)
Stable for transitioning to outpt management

## 2011-12-27 ENCOUNTER — Telehealth (INDEPENDENT_AMBULATORY_CARE_PROVIDER_SITE_OTHER): Payer: Self-pay | Admitting: General Surgery

## 2011-12-28 NOTE — Discharge Summary (Signed)
F/U with Dr. Dwain Sarna soon

## 2011-12-31 ENCOUNTER — Telehealth (INDEPENDENT_AMBULATORY_CARE_PROVIDER_SITE_OTHER): Payer: Self-pay

## 2011-12-31 NOTE — Telephone Encounter (Signed)
Toniann Fail with [facility] stating the patient was having stool drain from his rectum and not his colostomy. Stated he had an xray yesterday and it appeared unremarkable. Spoke with Helmut Muster Dr Kerry Dory, MA and she will contact Toniann Fail with advise.

## 2011-12-31 NOTE — Telephone Encounter (Signed)
Called Toniann Fail back to talk to her some more about the pt stating he is passing a BM thru his bottom. I asked Toniann Fail when this started and she said the pt told her yesterday it happened one time in the toilet. The pt did not actually get to see what the pt was describing to them. The pt is very mad and his condition right now. The pt wants the drain he has taken out ASAP. I advised Toniann Fail that I will discuss this with Dr Dwain Sarna and get back in touch with her on Tuesday. I did advise about the drainage or BM that the pt was talking about was prob. Mucous from the colon that the pt will constantly procuce from the colon. I advised the pt that it's not uncommon for the body to be producing the mucous stuff. The nurse will explain this to the pt.  The pt does not want the drain in anymore he keeps threating to pull it out himself at the facility. The pt has a f/u appt with Dr Dwain Sarna on 01-28-12 is this ok or does he need a earlier time. Please advise

## 2012-01-28 ENCOUNTER — Encounter (INDEPENDENT_AMBULATORY_CARE_PROVIDER_SITE_OTHER): Payer: Self-pay | Admitting: General Surgery

## 2012-01-28 ENCOUNTER — Ambulatory Visit (INDEPENDENT_AMBULATORY_CARE_PROVIDER_SITE_OTHER): Payer: Medicaid Other | Admitting: General Surgery

## 2012-01-28 VITALS — BP 128/72 | HR 71 | Temp 98.5°F | Resp 16 | Ht 67.0 in | Wt 113.4 lb

## 2012-01-28 DIAGNOSIS — Z09 Encounter for follow-up examination after completed treatment for conditions other than malignant neoplasm: Secondary | ICD-10-CM

## 2012-01-28 NOTE — Progress Notes (Signed)
Subjective:     Patient ID: Zachary Walls, male   DOB: 1956/09/16, 56 y.o.   MRN: [id number]  HPI This is a 56 year old male who had a diverticular obstruction and diverticulitis. He had this for about 2 weeks when I saw him. I took him to the operating room and performed a small bowel resection, sigmoid colectomy, and end colostomy. I also placed a gastrostomy at that time. He stayed in the hospital for quite a while afterwards but eventually was discharged doing well. He is now eating well. He is gaining weight. His colostomy is functioning well and he really reports no complaints today. He is walking quite a lot also.  Review of Systems     Objective:   Physical Exam  Vitals reviewed. Constitutional: He appears well-developed and well-nourished.  Abdominal: Soft. No hernia.         Assessment:     Diverticulitis s/p end colostomy, sigmoid colectomy, small bowel resection, g tube    Plan:    He is doing very well now. I removed his gastrostomy tube today without any difficulty. I told him this needs to be covered and change daily or as needed if he gets soiled. This should close up over the next week or two. If it does not close up he still is leaking in a couple weeks he needs to call and come back sooner. Otherwise I will plan on seeing him back in 2 months. I would not even entertain a discussion about a colostomy takedown for 6 months from now. I also clipped some of the stitches that were coming from this incision and I will look at those again in a couple of months. He has no restrictions from his surgery at this point.

## 2012-08-05 ENCOUNTER — Encounter (INDEPENDENT_AMBULATORY_CARE_PROVIDER_SITE_OTHER): Payer: Self-pay | Admitting: General Surgery

## 2012-08-05 ENCOUNTER — Ambulatory Visit (INDEPENDENT_AMBULATORY_CARE_PROVIDER_SITE_OTHER): Payer: Medicaid Other | Admitting: General Surgery

## 2012-08-05 VITALS — BP 136/84 | HR 76 | Temp 98.5°F | Resp 14 | Ht 67.0 in | Wt 113.8 lb

## 2012-08-05 DIAGNOSIS — R1084 Generalized abdominal pain: Secondary | ICD-10-CM

## 2012-08-05 DIAGNOSIS — Z8719 Personal history of other diseases of the digestive system: Secondary | ICD-10-CM

## 2012-08-05 DIAGNOSIS — G8929 Other chronic pain: Secondary | ICD-10-CM

## 2012-08-05 DIAGNOSIS — IMO0002 Reserved for concepts with insufficient information to code with codable children: Secondary | ICD-10-CM

## 2012-08-05 NOTE — Progress Notes (Signed)
Subjective:     Patient ID: Zachary Walls, male   DOB: [birth year], 56 y.o.   MRN: [id number]  HPI This is a 56 year old male who had a diverticular obstruction and diverticulitis. He had this for about 2 weeks when I saw him. I took him to the operating room and performed a small bowel resection, sigmoid colectomy, and end colostomy. I also placed a gastrostomy at that time. He stayed in the hospital for quite a while afterwards but eventually was discharged doing well. I removed his G-tube earlier this year. He's not really gained a lot of weight right now. His appetite has not completely normal. He is maintaining his weight. He is walking a fair amount also. He comes in today with complaints that he thinks he has a recurrent groin hernia as well as the fact that he wants his colostomy taken down. He currently is still smoking about one pack per day..   Review of Systems     Objective:   Physical Exam  Abdominal: No hernia. Hernia confirmed negative in the right inguinal area and confirmed negative in the left inguinal area.         Assessment:     Colostomy     Plan:     This colostomy takedowns can be very difficult. This is due to his prior surgery as well as the diverticulitis and at surgery. I told him that I would be willing to discuss that with him that he has to stop smoking first. He is going to call me when he stopped smoking. I offered to send him to smoking cessation class but he does not want to.  He understands I will not take down his stoma when he is smoking due to risk.

## 2012-09-04 ENCOUNTER — Emergency Department (HOSPITAL_COMMUNITY)
Admission: EM | Admit: 2012-09-04 | Discharge: 2012-09-07 | Disposition: A | Discharge status: Home / Self Care | Payer: Medicaid Other | Attending: Emergency Medicine | Admitting: Emergency Medicine

## 2012-09-04 ENCOUNTER — Encounter (HOSPITAL_COMMUNITY): Payer: Self-pay | Admitting: Emergency Medicine

## 2012-09-04 DIAGNOSIS — I10 Essential (primary) hypertension: Secondary | ICD-10-CM | POA: Insufficient documentation

## 2012-09-04 DIAGNOSIS — Z008 Encounter for other general examination: Secondary | ICD-10-CM | POA: Insufficient documentation

## 2012-09-04 DIAGNOSIS — J4489 Other specified chronic obstructive pulmonary disease: Secondary | ICD-10-CM | POA: Insufficient documentation

## 2012-09-04 DIAGNOSIS — Z79899 Other long term (current) drug therapy: Secondary | ICD-10-CM | POA: Insufficient documentation

## 2012-09-04 DIAGNOSIS — H902 Conductive hearing loss, unspecified: Secondary | ICD-10-CM | POA: Insufficient documentation

## 2012-09-04 DIAGNOSIS — R109 Unspecified abdominal pain: Secondary | ICD-10-CM | POA: Insufficient documentation

## 2012-09-04 DIAGNOSIS — Z8701 Personal history of pneumonia (recurrent): Secondary | ICD-10-CM | POA: Insufficient documentation

## 2012-09-04 DIAGNOSIS — F1010 Alcohol abuse, uncomplicated: Secondary | ICD-10-CM | POA: Insufficient documentation

## 2012-09-04 DIAGNOSIS — F17200 Nicotine dependence, unspecified, uncomplicated: Secondary | ICD-10-CM | POA: Insufficient documentation

## 2012-09-04 DIAGNOSIS — J449 Chronic obstructive pulmonary disease, unspecified: Secondary | ICD-10-CM | POA: Insufficient documentation

## 2012-09-04 LAB — LIPASE, BLOOD: Lipase: 36 U/L (ref 11–59)

## 2012-09-04 LAB — COMPREHENSIVE METABOLIC PANEL
Alkaline Phosphatase: 75 U/L (ref 39–117)
BUN: 3 mg/dL — ABNORMAL LOW (ref 6–23)
CO2: 27 mEq/L (ref 19–32)
Chloride: 95 mEq/L — ABNORMAL LOW (ref 96–112)
Creatinine, Ser: 0.74 mg/dL (ref 0.50–1.35)
GFR calc Af Amer: >90 mL/min (ref >90)
GFR calc non Af Amer: >90 mL/min (ref >90)
Glucose, Bld: 85 mg/dL (ref 70–99)
Potassium: 3.7 mEq/L (ref 3.5–5.1)
Total Bilirubin: 0.7 mg/dL (ref 0.3–1.2)

## 2012-09-04 LAB — RAPID URINE DRUG SCREEN, HOSP PERFORMED
Barbiturates: NOT DETECTED
Benzodiazepines: NOT DETECTED

## 2012-09-04 LAB — CBC
HCT: 41 % (ref 39.0–52.0)
Hemoglobin: 14.5 g/dL (ref 13.0–17.0)
MCV: 87.8 fL (ref 78.0–100.0)
RDW: 13.4 % (ref 11.5–15.5)
WBC: 7.3 10*3/uL (ref 4.0–10.5)

## 2012-09-04 LAB — ETHANOL: Alcohol, Ethyl (B): 99 mg/dL — ABNORMAL HIGH (ref 0–11)

## 2012-09-04 MED ORDER — LOPERAMIDE HCL 2 MG PO CAPS: 2.0000 mg | ORAL_CAPSULE | ORAL | Status: DC | PRN

## 2012-09-04 MED ORDER — HYDROCHLOROTHIAZIDE 25 MG PO TABS
25.0000 mg | ORAL_TABLET | Freq: Every day | ORAL | Status: DC
  Administered 2012-09-05 – 2012-09-07 (×3): 25 mg via ORAL
  Filled 2012-09-04 (×4): qty 1

## 2012-09-04 MED ORDER — THIAMINE HCL 100 MG/ML IJ SOLN
100.0000 mg | Freq: Every day | INTRAMUSCULAR | Status: DC
  Filled 2012-09-04: qty 2

## 2012-09-04 MED ORDER — LORAZEPAM 0.5 MG PO TABS
0.5000 mg | ORAL_TABLET | Freq: Three times a day (TID) | ORAL | Status: DC
  Administered 2012-09-05 – 2012-09-07 (×7): 0.5 mg via ORAL
  Filled 2012-09-04 (×7): qty 1

## 2012-09-04 MED ORDER — DICYCLOMINE HCL 20 MG PO TABS
20.0000 mg | ORAL_TABLET | Freq: Four times a day (QID) | ORAL | Status: DC | PRN
  Administered 2012-09-05 – 2012-09-06 (×2): 20 mg via ORAL
  Filled 2012-09-04 (×2): qty 1

## 2012-09-04 MED ORDER — HYDROCHLOROTHIAZIDE 12.5 MG PO CAPS: 12.5000 mg | ORAL_CAPSULE | Freq: Once | ORAL | Status: DC

## 2012-09-04 MED ORDER — LORAZEPAM 1 MG PO TABS
1.0000 mg | ORAL_TABLET | Freq: Four times a day (QID) | ORAL | Status: DC | PRN
  Filled 2012-09-04 (×4): qty 1

## 2012-09-04 MED ORDER — LORAZEPAM 1 MG PO TABS: 0.0000 mg | ORAL_TABLET | Freq: Two times a day (BID) | ORAL | Status: DC

## 2012-09-04 MED ORDER — HYDROCHLOROTHIAZIDE 25 MG PO TABS
25.0000 mg | ORAL_TABLET | Freq: Once | ORAL | Status: AC
  Administered 2012-09-04: 25 mg via ORAL
  Filled 2012-09-04: qty 1

## 2012-09-04 MED ORDER — LAMOTRIGINE 25 MG PO TABS
25.0000 mg | ORAL_TABLET | Freq: Two times a day (BID) | ORAL | Status: DC
  Administered 2012-09-04 – 2012-09-07 (×6): 25 mg via ORAL
  Filled 2012-09-04 (×7): qty 1

## 2012-09-04 MED ORDER — ONDANSETRON 4 MG PO TBDP: 4.0000 mg | ORAL_TABLET | Freq: Four times a day (QID) | ORAL | Status: DC | PRN

## 2012-09-04 MED ORDER — HYDROXYZINE HCL 25 MG PO TABS: 25.0000 mg | ORAL_TABLET | Freq: Four times a day (QID) | ORAL | Status: DC | PRN

## 2012-09-04 MED ORDER — IPRATROPIUM-ALBUTEROL 18-103 MCG/ACT IN AERO: 2.0000 | INHALATION_SPRAY | Freq: Four times a day (QID) | RESPIRATORY_TRACT | Status: DC

## 2012-09-04 MED ORDER — ADULT MULTIVITAMIN W/MINERALS CH
1.0000 | ORAL_TABLET | Freq: Every day | ORAL | Status: DC
  Administered 2012-09-04 – 2012-09-07 (×5): 1 via ORAL
  Filled 2012-09-04 (×4): qty 1

## 2012-09-04 MED ORDER — METHOCARBAMOL 500 MG PO TABS
500.0000 mg | ORAL_TABLET | Freq: Three times a day (TID) | ORAL | Status: DC | PRN
  Administered 2012-09-05 – 2012-09-06 (×2): 500 mg via ORAL
  Filled 2012-09-04 (×2): qty 1

## 2012-09-04 MED ORDER — IPRATROPIUM-ALBUTEROL 20-100 MCG/ACT IN AERS
1.0000 | INHALATION_SPRAY | Freq: Four times a day (QID) | RESPIRATORY_TRACT | Status: DC
  Administered 2012-09-04 – 2012-09-07 (×10): 1 via RESPIRATORY_TRACT
  Filled 2012-09-04: qty 4

## 2012-09-04 MED ORDER — FOLIC ACID 1 MG PO TABS
1.0000 mg | ORAL_TABLET | Freq: Every day | ORAL | Status: DC
  Administered 2012-09-04 – 2012-09-07 (×4): 1 mg via ORAL
  Filled 2012-09-04 (×4): qty 1

## 2012-09-04 MED ORDER — HYDROCHLOROTHIAZIDE 12.5 MG PO CAPS
25.0000 mg | ORAL_CAPSULE | Freq: Once | ORAL | Status: DC
  Filled 2012-09-04: qty 2

## 2012-09-04 MED ORDER — NAPROXEN 500 MG PO TABS
500.0000 mg | ORAL_TABLET | Freq: Two times a day (BID) | ORAL | Status: DC | PRN
  Administered 2012-09-05 – 2012-09-07 (×2): 500 mg via ORAL
  Filled 2012-09-04 (×2): qty 1

## 2012-09-04 MED ORDER — VITAMIN B-1 100 MG PO TABS
100.0000 mg | ORAL_TABLET | Freq: Every day | ORAL | Status: DC
  Administered 2012-09-04 – 2012-09-07 (×4): 100 mg via ORAL
  Filled 2012-09-04 (×4): qty 1

## 2012-09-04 MED ORDER — LORAZEPAM 2 MG/ML IJ SOLN: 1.0000 mg | Freq: Four times a day (QID) | INTRAMUSCULAR | Status: DC | PRN

## 2012-09-04 MED ORDER — HYDROCHLOROTHIAZIDE 12.5 MG PO CAPS
25.0000 mg | ORAL_CAPSULE | Freq: Every day | ORAL | Status: DC
  Filled 2012-09-04: qty 2

## 2012-09-04 MED ORDER — LORAZEPAM 1 MG PO TABS
0.0000 mg | ORAL_TABLET | Freq: Four times a day (QID) | ORAL | Status: AC
  Administered 2012-09-04: 1 mg via ORAL
  Administered 2012-09-05: 2 mg via ORAL
  Administered 2012-09-05 – 2012-09-06 (×4): 1 mg via ORAL
  Filled 2012-09-04: qty 1
  Filled 2012-09-04: qty 2
  Filled 2012-09-04: qty 1

## 2012-09-04 NOTE — ED Notes (Signed)
Pt.'s all personal belongings taken by pt.'s daughter , Joie Bimler. Pt. On paper scrub and wanded by security.

## 2012-09-04 NOTE — ED Provider Notes (Signed)
History     CSN: [id number]  Arrival date & time 09/04/12  1945   First MD Initiated Contact with Patient 09/04/12 2012      Chief Complaint  Patient presents with  . Medical Clearance  . Abdominal Pain  . Hypertension    The history is provided by the patient.   The patient's daughter reports that he called her earlier and reported he needs help with both alcohol abuse and substance abuse specifically his chronic pain medicine which is Percocet.  He denies homicidal or suicidal thoughts.  Daughter reports that he has threatened to kill himself and other people.  Patient denies active abdominal pain or nausea or vomiting.  He reports noncompliance with his medications.  No shortness of breath.  Symptoms are mild in severity.  Her symptoms are mild in severity   Past Medical History  Diagnosis Date  . COPD (chronic obstructive pulmonary disease)   . Pneumonia   . Hypertension   . HOH (hard of hearing) 12/01/2011    Past Surgical History  Procedure Date  . Hernia repair     inguinal  . 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 Colostomy revision 12/11/2011    Procedure: COLON RESECTION SIGMOID;  Surgeon: Emelia Loron, MD;  Location: [facility];  Service: General;;  . Bowel resection 12/11/2011    Procedure: SMALL BOWEL RESECTION;  Surgeon: Emelia Loron, MD;  Location: [facility];  Service: General;;  times 2  . Cystoscopy w/ ureteral stent placement 12/11/2011    Procedure: CYSTOSCOPY WITH STENT REPLACEMENT;  Surgeon: Anner Crete, MD;  Location: [facility];  Service: Urology;  Laterality: Left;  ureteral catheter placement    Family History  Problem Relation Age of Onset  . Heart attack Father   . Cancer Mother   . HIV Brother     History  Substance Use Topics  . Smoking status: Current Every Day Smoker    Last Attempt to Quit: 11/03/2011  . Smokeless tobacco: Never Used  .  Alcohol Use: Yes     Comment: occasionally      Review of Systems  Gastrointestinal: Positive for abdominal pain.  All other systems reviewed and are negative.    Allergies  Codeine  Home Medications   Current Outpatient Rx  Name  Route  Sig  Dispense  Refill  . IPRATROPIUM-ALBUTEROL 18-103 MCG/ACT IN AERO   Inhalation   Inhale 2 puffs into the lungs every 6 (six) hours.         [name] LAMOTRIGINE 25 MG PO TABS   Oral   Take 25 mg by mouth 2 (two) times daily.          [name] LORAZEPAM 0.5 MG PO TABS   Oral   Take 0.5 mg by mouth every 8 (eight) hours. Anxiety           BP 219/111  Pulse 99  Temp 98.5 F (36.9 C) (Oral)  Resp 20  SpO2 97%  Physical Exam  Nursing note and vitals reviewed. Constitutional: He is oriented to person, place, and time. He appears well-developed and well-nourished.  HENT:  Head: Normocephalic and atraumatic.  Eyes: EOM are normal.  Neck: Normal range of motion.  Cardiovascular: Normal rate, regular rhythm, normal heart sounds and intact distal pulses.   Pulmonary/Chest: Effort normal and breath sounds normal. No respiratory distress.  Abdominal: Soft. He exhibits no distension. There  is no tenderness.  Musculoskeletal: Normal range of motion.  Neurological: He is alert and oriented to person, place, and time.  Skin: Skin is warm and dry.  Psychiatric: He has a normal mood and affect. Judgment normal.    ED Course  Procedures (including critical care time)   Labs Reviewed  CBC  COMPREHENSIVE METABOLIC PANEL  LIPASE, BLOOD  ETHANOL  URINE RAPID DRUG SCREEN (HOSP PERFORMED)   No results found.    1. Alcohol abuse   MDM  9:22 PM i spoke with [facility] who will evaluate for placement.  No chest pain or shortness of breath at this time.  His blood pressure is noted to be 219/111.  Is not on any antihypertensives at home.  Will start him on hydrochlorothiazide at this time.  He is asymptomatic from his hypertension.  Call withdrawal  protocol.  Clonidine withdrawal protocol        Lyanne Co, MD 09/04/12 2122

## 2012-09-04 NOTE — ED Notes (Signed)
MD at bedside. 

## 2012-09-04 NOTE — ED Notes (Signed)
Pt has multiple complaints  Pt states he is having dental pain and generalized body aches  Pt states he is having abd pain and has a colostomy that he is having difficulty caring for  Pt states he is having abd pain with burning and tenderness at the colostomy site  Pt states he has not been eating for the past couple of weeks  Family states he has been abusing his pain medication and drinking alcohol daily  Family states pt has been forgetful not remembering conversations he has had with her, wandering away from home, leaving burners on the stove on and not remembering cutting them on, etc.  Family states pt has been threatening to kill himself and other people  Pt is very anxious in triage  Dr Dwain Sarna is the surgeon that did his colostomy surgery

## 2012-09-05 NOTE — ED Notes (Signed)
Please have pt notify Joie Bimler his daughter when ready to be transferred to [facility]. [phone]

## 2012-09-05 NOTE — ED Provider Notes (Signed)
Zachary Walls is a 56 y.o. male with chronic pain, and reported suicidal and homicidal ideation. These were alleged by his daughter. Currently, on evaluation, at 0800 hours. Patient is calm, and comfortable, states he is not suicidal, and "feels better.". He had been troubled with his ostomy output. But that improved when the bag was changed. He is now passing gas and stool in the colostomy. He is hungry. He lives alone. [facility] is arranging his discharge.  Flint Melter, MD 09/05/12 [phone]

## 2012-09-05 NOTE — BH Assessment (Signed)
Assessment Note   Zachary Walls is a 56 y.o. male who presents to [facility] for alcohol detox.  Pt denies SI/HI/Psych.  Pt reports he binges on alcohol and consumes a 12pk of beer.  Pt says he has been drinking heavily x63mos.  Pt is stressed because of his health problems and financial problems: pt has a colostomy bag but is able to manage the care on his own, has other health problems: HTN, and hard of hearing.  Pt has no pending legal issues, but admits to being in/out of prison since 1979, last incarceration was 2004--driving w/o a license and open container.  Pt has no previous detox hx. Pt denies any seizure/blackout hx.  Axis I: Alcohol Dep  Axis II: Deferred Axis III:  Past Medical History  Diagnosis Date  . COPD (chronic obstructive pulmonary disease)   . Pneumonia   . Hypertension   . HOH (hard of hearing) 12/01/2011   Axis IV: other psychosocial or environmental problems, problems related to social environment and problems with primary support group Axis V: 51-60 moderate symptoms  Past Medical History:  Past Medical History  Diagnosis Date  . COPD (chronic obstructive pulmonary disease)   . Pneumonia   . Hypertension   . HOH (hard of hearing) 12/01/2011    Past Surgical History  Procedure Date  . Hernia repair     inguinal  . 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 Colostomy revision 12/11/2011    Procedure: COLON RESECTION SIGMOID;  Surgeon: Emelia Loron, MD;  [facility];  Service: General;;  . Bowel resection 12/11/2011    Procedure: SMALL BOWEL RESECTION;  Surgeon: Emelia Loron, MD;  Location[facility];  Service: General;;  times 2  . Cystoscopy w/ ureteral stent placement 12/11/2011    Procedure: CYSTOSCOPY WITH STENT REPLACEMENT;  Surgeon: Anner Crete, MD;  Location[facility];  Service: Urology;  Laterality: Left;  ureteral catheter placement    Family History:    Family History  Problem Relation Age of Onset  . Heart attack Father   . Cancer Mother   . HIV Brother     Social History:  reports that he has been smoking.  He has never used smokeless tobacco. He reports that he drinks alcohol. He reports that he does not use illicit drugs.  Additional Social History:  Alcohol / Drug Use Pain Medications: None  Prescriptions: None  Over the Counter: None  History of alcohol / drug use?: Yes Longest period of sobriety (when/how long): Unk  Negative Consequences of Use: Personal relationships Withdrawal Symptoms: Other (Comment) (No w/d sxs at this time ) Substance #1 Name of Substance 1: Alcohol  1 - Age of First Use: 17 YOM  1 - Amount (size/oz): 12 pk  1 - Frequency: Binge  1 - Duration: On-going  1 - Last Use / Amount: 09/04/12  CIWA: CIWA-Ar BP: 142/85 mmHg Pulse Rate: 82  Nausea and Vomiting: no nausea and no vomiting Tactile Disturbances: none Tremor: no tremor Auditory Disturbances: not present Paroxysmal Sweats: no sweat visible Visual Disturbances: not present Anxiety: no anxiety, at ease Headache, Fullness in Head: none present Agitation: normal activity Orientation and Clouding of Sensorium: oriented and can do serial additions CIWA-Ar Total: 0  COWS:    Allergies:  Allergies  Allergen Reactions  . Codeine Rash    Home Medications:  (Not in a hospital admission)  OB/GYN Status:  No LMP for male patient.  General Assessment Data Location of Assessment: [facility] Living Arrangements: Alone Can pt return to current living arrangement?: Yes Admission Status: Voluntary Is patient capable of signing voluntary admission?: Yes Transfer from: Acute Hospital Referral Source: MD  Education Status Is patient currently in school?: No Current Grade: None  Highest grade of school patient has completed: None  Name of school: None  Contact person: none   Risk to self Suicidal Ideation: No Suicidal Intent: No Is patient  at risk for suicide?: No Suicidal Plan?: No Access to Means: No What has been your use of drugs/alcohol within the last 12 months?: Abusing: alcohol  Previous Attempts/Gestures: No How many times?: 0  Other Self Harm Risks: None  Triggers for Past Attempts: None known Intentional Self Injurious Behavior: None Family Suicide History: No Recent stressful life event(s): Other (Comment) (Health,SA ) Persecutory voices/beliefs?: No Depression: No Depression Symptoms:  (None reported ) Substance abuse history and/or treatment for substance abuse?: Yes Suicide prevention information given to non-admitted patients: Not applicable  Risk to Others Homicidal Ideation: No Thoughts of Harm to Others: No Current Homicidal Intent: No Current Homicidal Plan: No Access to Homicidal Means: No Identified Victim: None  History of harm to others?: No Assessment of Violence: None Noted Violent Behavior Description: None  Does patient have access to weapons?: No Criminal Charges Pending?: No Does patient have a court date: No  Psychosis Hallucinations: None noted Delusions: None noted  Mental Status Report Appear/Hygiene: Disheveled Eye Contact: Good Motor Activity: Unremarkable Speech: Logical/coherent Level of Consciousness: Alert Mood:  (Appropriate ) Affect: Appropriate to circumstance Anxiety Level: None Thought Processes: Coherent;Relevant Judgement: Unimpaired Orientation: Person;Place;Time;Situation Obsessive Compulsive Thoughts/Behaviors: None  Cognitive Functioning Concentration: Normal Memory: Recent Intact;Remote Intact IQ: Average Insight: Good Impulse Control: Good Appetite: Good Weight Loss: 0  Weight Gain: 0  Sleep: No Change Total Hours of Sleep: 6  Vegetative Symptoms: None  ADLScreening [facility] Assessment Services) Patient's cognitive ability adequate to safely complete daily activities?: Yes Patient able to express need for assistance with ADLs?:  Yes Independently performs ADLs?: Yes (appropriate for developmental age)  Abuse/Neglect [facility]) Physical Abuse: Denies Verbal Abuse: Denies Sexual Abuse: Denies  Prior Inpatient Therapy Prior Inpatient Therapy: No Prior Therapy Dates: None  Prior Therapy Facilty/Provider(s): None  Reason for Treatment: None   Prior Outpatient Therapy Prior Outpatient Therapy: No Prior Therapy Dates: None  Prior Therapy Facilty/Provider(s): None  Reason for Treatment: None   ADL Screening (condition at time of admission) Patient's cognitive ability adequate to safely complete daily activities?: Yes Patient able to express need for assistance with ADLs?: Yes Independently performs ADLs?: Yes (appropriate for developmental age) Weakness of Legs: None Weakness of Arms/Hands: None  Home Assistive Devices/Equipment Home Assistive Devices/Equipment: None  Therapy Consults (therapy consults require a physician order) PT Evaluation Needed: No OT Evalulation Needed: No SLP Evaluation Needed: No Abuse/Neglect Assessment (Assessment to be complete while patient is alone) Physical Abuse: Denies Verbal Abuse: Denies Sexual Abuse: Denies Exploitation of patient/patient's resources: Denies Self-Neglect: Denies Values / Beliefs Cultural Requests During Hospitalization: None Spiritual Requests During Hospitalization: None Consults Spiritual Care Consult Needed: No Social Work Consult Needed: No [employer]chant [employer] officer (For Healthcare) Advance Directive: Patient does not have advance directive;Patient would not like information Pre-existing out of facility DNR order (yellow form or pink MOST form): No Nutrition Screen- [facility] Adult/[facility] Patient's home diet: Regular Have you recently lost weight without trying?: No Have you been eating poorly because of  a decreased appetite?: No Malnutrition Screening Tool Score: 0   Additional Information 1:1 In Past 12 Months?: No CIRT Risk: No Elopement Risk: No Does  patient have medical clearance?: Yes     Disposition:  Disposition Disposition of Patient: Inpatient treatment program;Referred to [facility] ) Type of inpatient treatment program: Adult Patient referred to: Other (Comment) [facility] )  On Site Evaluation by:   Reviewed with Physician:     Beatrix Shipper C 09/05/2012 3:01 AM

## 2012-09-06 NOTE — ED Notes (Signed)
Pt ambulated in the hall steady on feet sitter at side

## 2012-09-06 NOTE — ED Provider Notes (Signed)
Pt c/o chronic left shoulder and abdominal pain. Pt has ETOH and narcotic prescription abuse.Pt is hard of hearing. Pt states he feel sluggish today. Thin elderly WM sleeping and easily arousable.  Per [facility] pt has been accepted at [facility] by Dr Dub Mikes to 300 bed pending availability.   Ward Givens, MD 09/06/12 [phone]

## 2012-09-07 NOTE — ED Notes (Signed)
Pt given information about resources in the community  to f/u for treatment .

## 2012-09-07 NOTE — ED Notes (Signed)
Pt discharged to home. Left unit ambulating to checkout accompanied by daughter. Medicated with pain med prior to DC. Left in good condition.

## 2012-09-07 NOTE — ED Provider Notes (Addendum)
Zachary Walls is a 56 y.o. male who initially came here for detoxification from alcohol and narcotics. He was also having colostomy bag malfunction on arrival. The patient continues to be calm, and comfortable, and does not exhibit any agitation, or abnormal vital signs. He is eating well, he does not have abdominal pain. I saw the patient. 2 days ago, and he was in the same state. I had asked [facility] to arrange outpatient treatment for him, that day. I will asked him again to do that today. The patient should be discharged today.  Flint Melter, MD 09/07/12 [phone]   11:50- he has been seen by [facility]. He remains stable, comfortable. His family, daughter, is here to take him home. He has been given outpatient treatment options for alcoholism.  Flint Melter, MD 09/07/12 [phone]

## 2012-09-07 NOTE — ED Notes (Signed)
Per MD request, CSW met with pt to discuss community resources that are available to him upon his discharge. CSW provided pt with outpatient information regarding community substance abuse treatment and counseling. Pt verbalized that he appreciated the information and will follow up with the appropriate agencies in the future if he needs to do so. No other concerns verbalized by pt at this time. CSW telephoned pt's daughter to coordinate transportation upon d/c. Pt's daughter reported she will be at [facility] at 11:30 to p/u pt.   Janann Colonel., MSW, [facility] Clinical Social Worker [phone]

## 2012-12-25 ENCOUNTER — Telehealth (INDEPENDENT_AMBULATORY_CARE_PROVIDER_SITE_OTHER): Payer: Self-pay | Admitting: General Surgery

## 2012-12-25 NOTE — Telephone Encounter (Signed)
Pt called to discuss reversal of his colostomy.  Per Dr. Doreen Salvage note regarding his smoking, the pt admits to smoking "about two times a day" and "I cut way back, though."  Appt confirmed for 01/15/13 with Dr. Dwain Sarna to discuss reversal, but pt instructed to stop all the smoking by then for him to consider scheduling surgery.  He states he will try.

## 2013-01-15 ENCOUNTER — Encounter (INDEPENDENT_AMBULATORY_CARE_PROVIDER_SITE_OTHER): Payer: Self-pay | Admitting: General Surgery

## 2013-01-15 ENCOUNTER — Ambulatory Visit (INDEPENDENT_AMBULATORY_CARE_PROVIDER_SITE_OTHER): Payer: Medicaid Other | Admitting: General Surgery

## 2013-01-15 VITALS — BP 130/80 | HR 76 | Temp 98.8°F | Resp 16 | Ht 67.0 in | Wt 115.0 lb

## 2013-01-15 DIAGNOSIS — K5732 Diverticulitis of large intestine without perforation or abscess without bleeding: Secondary | ICD-10-CM

## 2013-01-15 DIAGNOSIS — Z433 Encounter for attention to colostomy: Secondary | ICD-10-CM

## 2013-01-15 DIAGNOSIS — K5720 Diverticulitis of large intestine with perforation and abscess without bleeding: Secondary | ICD-10-CM

## 2013-01-15 NOTE — Patient Instructions (Signed)
Go to the drugstore and get Miralax and use once daily. When you have quit smoking please call me and we will schedule your surgery. Your daughter needs to come to appointment.

## 2013-01-15 NOTE — Progress Notes (Signed)
Subjective:     Patient ID: Zachary Walls, male   DOB: [birth year], 57 y.o.   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 He has not had takedown yet.  He is overall not very healthy and has some sob with activity.  He also continues to smoke.  He has no other issues except for some occasional constipation.  He would like to discuss takedown again today but has not stopped smoking.  Review of Systems     Objective:   Physical Exam Healed incision, I can palpate knots on pds as he is so think stoma pink and functional with hard stool present, abdomen is soft    Assessment:     hartmanns     Plan:    He would like to schedule colostomy takedown.  I think this will be difficult and need to maximize chances for success.  His nutrition appears to be improving.  I told him again to reduce complications I need him to stop smoking before surgery.  We could do this while smoking but I think his risk is too great.  It is well documented with healing, infection, pulmonary complications that smoking increases these.  i asked him to call me when he stopped for a couple weeks and then we can schedule.  He understands and agrees with that.  I also advised him to increase water intake and take some miralax.

## 2013-01-20 HISTORY — PX: TRACHEOSTOMY: SUR1362

## 2013-02-05 ENCOUNTER — Encounter (HOSPITAL_COMMUNITY): Payer: Self-pay

## 2013-02-05 ENCOUNTER — Inpatient Hospital Stay (HOSPITAL_COMMUNITY)
Admission: EM | Admit: 2013-02-05 | Discharge: 2013-02-23 | DRG: 004 | Disposition: A | Discharge status: Skilled Nursing Facility | Payer: Medicaid Other | Attending: Pulmonary Disease | Admitting: Pulmonary Disease

## 2013-02-05 ENCOUNTER — Emergency Department (HOSPITAL_COMMUNITY): Payer: Medicaid Other

## 2013-02-05 DIAGNOSIS — R410 Disorientation, unspecified: Secondary | ICD-10-CM

## 2013-02-05 DIAGNOSIS — G931 Anoxic brain damage, not elsewhere classified: Secondary | ICD-10-CM

## 2013-02-05 DIAGNOSIS — E870 Hyperosmolality and hypernatremia: Secondary | ICD-10-CM | POA: Diagnosis present

## 2013-02-05 DIAGNOSIS — J439 Emphysema, unspecified: Secondary | ICD-10-CM | POA: Diagnosis present

## 2013-02-05 DIAGNOSIS — H9190 Unspecified hearing loss, unspecified ear: Secondary | ICD-10-CM | POA: Diagnosis present

## 2013-02-05 DIAGNOSIS — Z930 Tracheostomy status: Secondary | ICD-10-CM

## 2013-02-05 DIAGNOSIS — R1310 Dysphagia, unspecified: Secondary | ICD-10-CM | POA: Diagnosis present

## 2013-02-05 DIAGNOSIS — R4182 Altered mental status, unspecified: Secondary | ICD-10-CM

## 2013-02-05 DIAGNOSIS — E872 Acidosis, unspecified: Secondary | ICD-10-CM | POA: Diagnosis present

## 2013-02-05 DIAGNOSIS — J9600 Acute respiratory failure, unspecified whether with hypoxia or hypercapnia: Secondary | ICD-10-CM | POA: Diagnosis present

## 2013-02-05 DIAGNOSIS — J9620 Acute and chronic respiratory failure, unspecified whether with hypoxia or hypercapnia: Secondary | ICD-10-CM

## 2013-02-05 DIAGNOSIS — E876 Hypokalemia: Secondary | ICD-10-CM | POA: Diagnosis present

## 2013-02-05 DIAGNOSIS — E871 Hypo-osmolality and hyponatremia: Secondary | ICD-10-CM

## 2013-02-05 DIAGNOSIS — J441 Chronic obstructive pulmonary disease with (acute) exacerbation: Principal | ICD-10-CM

## 2013-02-05 DIAGNOSIS — D72829 Elevated white blood cell count, unspecified: Secondary | ICD-10-CM

## 2013-02-05 DIAGNOSIS — J9601 Acute respiratory failure with hypoxia: Secondary | ICD-10-CM

## 2013-02-05 DIAGNOSIS — I10 Essential (primary) hypertension: Secondary | ICD-10-CM

## 2013-02-05 DIAGNOSIS — F17200 Nicotine dependence, unspecified, uncomplicated: Secondary | ICD-10-CM | POA: Diagnosis present

## 2013-02-05 DIAGNOSIS — I469 Cardiac arrest, cause unspecified: Secondary | ICD-10-CM | POA: Diagnosis present

## 2013-02-05 DIAGNOSIS — Z933 Colostomy status: Secondary | ICD-10-CM

## 2013-02-05 DIAGNOSIS — N179 Acute kidney failure, unspecified: Secondary | ICD-10-CM

## 2013-02-05 DIAGNOSIS — J449 Chronic obstructive pulmonary disease, unspecified: Secondary | ICD-10-CM

## 2013-02-05 DIAGNOSIS — Z681 Body mass index (BMI) 19 or less, adult: Secondary | ICD-10-CM

## 2013-02-05 DIAGNOSIS — F10239 Alcohol dependence with withdrawal, unspecified: Secondary | ICD-10-CM | POA: Diagnosis present

## 2013-02-05 DIAGNOSIS — F10939 Alcohol use, unspecified with withdrawal, unspecified: Secondary | ICD-10-CM | POA: Diagnosis present

## 2013-02-05 DIAGNOSIS — E46 Unspecified protein-calorie malnutrition: Secondary | ICD-10-CM

## 2013-02-05 DIAGNOSIS — E43 Unspecified severe protein-calorie malnutrition: Secondary | ICD-10-CM | POA: Diagnosis present

## 2013-02-05 DIAGNOSIS — F1020 Alcohol dependence, uncomplicated: Secondary | ICD-10-CM | POA: Diagnosis present

## 2013-02-05 DIAGNOSIS — J4489 Other specified chronic obstructive pulmonary disease: Secondary | ICD-10-CM

## 2013-02-05 DIAGNOSIS — IMO0002 Reserved for concepts with insufficient information to code with codable children: Secondary | ICD-10-CM | POA: Diagnosis present

## 2013-02-05 DIAGNOSIS — R404 Transient alteration of awareness: Secondary | ICD-10-CM

## 2013-02-05 LAB — URINE MICROSCOPIC-ADD ON

## 2013-02-05 LAB — URINALYSIS, ROUTINE W REFLEX MICROSCOPIC
Bilirubin Urine: NEGATIVE
Ketones, ur: 15 mg/dL — AB
Nitrite: NEGATIVE
Protein, ur: >300 mg/dL — AB
pH: 5.5 (ref 5.0–8.0)

## 2013-02-05 LAB — COMPREHENSIVE METABOLIC PANEL
Albumin: 4.5 g/dL (ref 3.5–5.2)
Alkaline Phosphatase: 77 U/L (ref 39–117)
BUN: 19 mg/dL (ref 6–23)
CO2: 19 mEq/L (ref 19–32)
Chloride: 80 mEq/L — ABNORMAL LOW (ref 96–112)
Creatinine, Ser: 1.53 mg/dL — ABNORMAL HIGH (ref 0.50–1.35)
GFR calc Af Amer: 57 mL/min — ABNORMAL LOW (ref >90)
GFR calc non Af Amer: 49 mL/min — ABNORMAL LOW (ref >90)
Glucose, Bld: 88 mg/dL (ref 70–99)
Potassium: 3.8 mEq/L (ref 3.5–5.1)
Total Bilirubin: 0.9 mg/dL (ref 0.3–1.2)

## 2013-02-05 LAB — CBC WITH DIFFERENTIAL/PLATELET
HCT: 37.3 % — ABNORMAL LOW (ref 39.0–52.0)
Hemoglobin: 14.3 g/dL (ref 13.0–17.0)
Lymphocytes Relative: 3 % — ABNORMAL LOW (ref 12–46)
Lymphs Abs: 0.7 10*3/uL (ref 0.7–4.0)
Monocytes Absolute: 2 10*3/uL — ABNORMAL HIGH (ref 0.1–1.0)
Monocytes Relative: 8 % (ref 3–12)
Neutro Abs: 22.7 10*3/uL — ABNORMAL HIGH (ref 1.7–7.7)
Neutrophils Relative %: 89 % — ABNORMAL HIGH (ref 43–77)
RBC: 4.53 MIL/uL (ref 4.22–5.81)

## 2013-02-05 LAB — BASIC METABOLIC PANEL
BUN: 19 mg/dL (ref 6–23)
CO2: 20 mEq/L (ref 19–32)
Chloride: 83 mEq/L — ABNORMAL LOW (ref 96–112)
Creatinine, Ser: 1.57 mg/dL — ABNORMAL HIGH (ref 0.50–1.35)
Glucose, Bld: 92 mg/dL (ref 70–99)

## 2013-02-05 LAB — RAPID URINE DRUG SCREEN, HOSP PERFORMED
Barbiturates: NOT DETECTED
Cocaine: NOT DETECTED
Tetrahydrocannabinol: POSITIVE — AB

## 2013-02-05 LAB — POCT I-STAT TROPONIN I: Troponin i, poc: 0.01 ng/mL (ref 0.00–0.08)

## 2013-02-05 LAB — AMMONIA: Ammonia: 37 umol/L (ref 11–60)

## 2013-02-05 LAB — CG4 I-STAT (LACTIC ACID): Lactic Acid, Venous: 2.82 mmol/L — ABNORMAL HIGH (ref 0.5–2.2)

## 2013-02-05 MED ORDER — THIAMINE HCL 100 MG/ML IJ SOLN
100.0000 mg | Freq: Every day | INTRAMUSCULAR | Status: DC
  Administered 2013-02-06: 100 mg via INTRAVENOUS
  Filled 2013-02-05: qty 1

## 2013-02-05 MED ORDER — SODIUM CHLORIDE 0.9 % IV SOLN
Freq: Once | INTRAVENOUS | Status: AC
  Administered 2013-02-05: 23:00:00 via INTRAVENOUS

## 2013-02-05 MED ORDER — SODIUM CHLORIDE 0.9 % IV BOLUS (SEPSIS)
1000.0000 mL | Freq: Once | INTRAVENOUS | Status: AC
  Administered 2013-02-05: 1000 mL via INTRAVENOUS

## 2013-02-05 MED ORDER — FOLIC ACID 1 MG PO TABS
1.0000 mg | ORAL_TABLET | Freq: Every day | ORAL | Status: DC
  Administered 2013-02-06 – 2013-02-09 (×4): 1 mg via ORAL
  Filled 2013-02-05 (×4): qty 1

## 2013-02-05 MED ORDER — PIPERACILLIN-TAZOBACTAM 3.375 G IVPB
3.3750 g | Freq: Four times a day (QID) | INTRAVENOUS | Status: DC
  Administered 2013-02-05: 3.375 g via INTRAVENOUS
  Filled 2013-02-05 (×3): qty 50

## 2013-02-05 MED ORDER — SODIUM CHLORIDE 0.9 % IV SOLN: 15.0000 mg/kg | Freq: Two times a day (BID) | INTRAVENOUS | Status: DC

## 2013-02-05 MED ORDER — VANCOMYCIN HCL IN DEXTROSE 1-5 GM/200ML-% IV SOLN
1000.0000 mg | Freq: Once | INTRAVENOUS | Status: AC
  Administered 2013-02-05: 1000 mg via INTRAVENOUS
  Filled 2013-02-05: qty 200

## 2013-02-05 MED ORDER — VANCOMYCIN HCL 10 G IV SOLR: 1.0000 g | Freq: Once | INTRAVENOUS | Status: DC

## 2013-02-05 NOTE — ED Notes (Signed)
Pt seems to be trying to respond verbally.  Family states that the pt is saying curse words in a very low whisper.

## 2013-02-05 NOTE — H&P (Signed)
PULMONARY  / CRITICAL CARE MEDICINE  Name: Zachary Walls MRN: [id number] DOB: 12-06-1955    ADMISSION DATE:  02/05/2013 CONSULTATION DATE:  08657  REFERRING MD :  Dr. Ignacia Palma PRIMARY SERVICE: PCCM  CHIEF COMPLAINT:  AMS  BRIEF PATIENT DESCRIPTION: Pt was last seen normal 4/16 at 4pm. Son found pt down in fetal position complaining of breathing. Pt had suspicious visitor that may have given pt drugs. 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 Pt continues to be altered and PCCM asked to manage.   SIGNIFICANT EVENTS / STUDIES:  4/17- AMS  LINES / TUBES:   CULTURES: 4/17 BCx 4/17 resp Cx 4/17 UCx  ANTIBIOTICS: 4/17 IV zosyn>>>4/17 4/17 Vanc >>>4/17  HISTORY OF PRESENT ILLNESS:  Pt is known heavy beer drinker daily. Had suspicious visitor may have given drugs. Continued to be altered. Son states likely opiates, ativan, and BP meds were taken by pt.   PAST MEDICAL HISTORY :  Past Medical History  Diagnosis Date  . COPD (chronic obstructive pulmonary disease)   . Pneumonia   . Hypertension   . HOH (hard of hearing) 12/01/2011   Past Surgical History  Procedure Laterality Date  . Hernia repair      inguinal  . 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 Colostomy revision  12/11/2011    Procedure: COLON RESECTION SIGMOID;  Surgeon: Emelia Loron, MD;  Location: [facility];  Service: General;;  . Bowel resection  12/11/2011    Procedure: SMALL BOWEL RESECTION;  Surgeon: Emelia Loron, MD;  Location: [facility];  Service: General;;  times 2  . Cystoscopy w/ ureteral stent placement  12/11/2011    Procedure: CYSTOSCOPY WITH STENT REPLACEMENT;  Surgeon: Anner Crete, MD;  Location: [facility];  Service: Urology;  Laterality: Left;  ureteral catheter placement   Prior to Admission medications   Medication Sig Start Date End Date Taking? Authorizing Provider   albuterol-ipratropium (COMBIVENT) 18-103 MCG/ACT inhaler Inhale 2 puffs into the lungs every 6 (six) hours as needed for wheezing or shortness of breath.  12/26/11  Yes Sherrie George, PA-C  lamoTRIgine (LAMICTAL) 25 MG tablet Take 25 mg by mouth 2 (two) times daily.    Yes Historical Provider, MD  LORazepam (ATIVAN) 0.5 MG tablet Take 0.5 mg by mouth every 8 (eight) hours. Anxiety   Yes Historical Provider, MD   Allergies  Allergen Reactions  . Codeine Rash    FAMILY HISTORY:  Family History  Problem Relation Age of Onset  . Heart attack Father   . Cancer Mother   . HIV Brother    SOCIAL HISTORY:  reports that he has been smoking.  He has never used smokeless tobacco. He reports that  drinks alcohol. He reports that he does not use illicit drugs.  REVIEW OF SYSTEMS:  Some given by family members as pt non-verbal. Has not been sick previously. No other complaints.   SUBJECTIVE: pt altered and non-sensical movements  VITAL SIGNS: Temp:  [99.7 F (37.6 C)-100 F (37.8 C)] 100 F (37.8 C) (04/17 1810) Pulse Rate:  [78-105] 100 (04/17 2230) Resp:  [15-24] 22 (04/17 2230) BP: (135-179)/(73-101) 169/85 mmHg (04/17 2230) SpO2:  [97 %-100 %] 98 % (04/17 2230) HEMODYNAMICS:   VENTILATOR SETTINGS:   INTAKE / OUTPUT: Intake/Output   None     PHYSICAL EXAMINATION: General:  Altered, making non-sensical movements,  non-verbal, RASS 1-2, cachetic  Neuro:  Slightly agitated HEENT:  PERRL, no jvd Cardiovascular:  RRR, no m/r/g/S3/S4 Lungs:  kussmal breathing, pursed lips, severe crackles, wheezing bilaterally Abdomen:  Colostomy with brown stool drainage Musculoskeletal:  Deconditioned,  Skin:  Multiple small non-draining abrasions  LABS:  Recent Labs Lab 02/05/13 1858 02/05/13 1931  HGB 14.3  --   WBC 25.5*  --   PLT 279  --   NA 120*  --   K 3.8  --   CL 80*  --   CO2 19  --   GLUCOSE 88  --   BUN 19  --   CREATININE 1.53*  --   CALCIUM 9.7  --   AST 252*  --    ALT 50  --   ALKPHOS 77  --   BILITOT 0.9  --   PROT 7.9  --   ALBUMIN 4.5  --   LATICACIDVEN  --  2.82*   No results found for this basename: GLUCAP,  in the last 168 hours  CXR: severe emphysematous changes, no infiltrates  ASSESSMENT / PLAN:  PULMONARY A: Severe emphysema with bullae; currently AE COPD P:   -CXR -duonebs q4 -IV solumedrol -stat ABG -supplemental O2  CARDIOVASCULAR A: No active issues. Hx of HTN P:  -cont to monitor -prn hydralazine SBP >220 -AM cortisol  RENAL A:  ARF. AG 21. Hyponatremia Likely beer proteinemia. UDS +THC, -EtOH, ?EtOHic ketosis/starvation ketosis, suspicious previous hx of multiple drug ingestions and visitor ?etheylene glycol P:   -serum osm, urine osm, salicylate, tylenol, ethylene glycol, serum ketones -Toxicology consulted -start fomipazil until ethylene glycol level back -IV NS -TSH  GASTROINTESTINAL A:  emergent Hartmann's procedure 3/13 2/2 SBO with colostomy non-revised by surgery 2/2 poor nutrition and overall medical status P:   -NPO -CT abd/pelvis  HEMATOLOGIC A:  Stable no active issues P:  -cont to monitor  INFECTIOUS A:  Leukocytosis 25. Afebrile.  P:   -procalcitonin -pancultures -HIV -f/u ct abdomen ordered by ED  ENDOCRINE A:  No active issues P:   -SSI -NPO altered  NEUROLOGIC A:  AMS in setting hyponatremia, chronic EtOH, THC+. CT head was negative. NH4 nrml P:   -monitor -ativan for withdrawl  TODAY'S SUMMARY: Pt was found down, AMS last seen normal 4/16, CT head negative. Pt hyponatremic.   Christen Bame, MD PGY-1 Pgr: [phone]  Attending:  I have seen and examined the patient with nurse practitioner/resident and agree with the note above.   I think the most likely scenario her is that he has beer potomania causing hyponatremia and AKI, but I'm concerned about his elevated anion gap and history of polysubstance abuse.  Will treat for ethylene glycol toxicity empirically until we  get a level back.  Also will treat for COPD exacerbation and hyponatremia with isotonic saline and free water restriction.  I have personally obtained a history, examined the patient, evaluated laboratory and imaging results, formulated the assessment and plan and placed orders. CRITICAL CARE: The patient is critically ill with multiple organ systems failure and requires high complexity decision making for assessment and support, frequent evaluation and titration of therapies, application of advanced monitoring technologies and extensive interpretation of multiple databases. Critical Care Time devoted to patient care services described in this note is 60 minutes.   Fonnie Jarvis Pulmonary and Critical Care Medicine [facility] Pager: [phone]  02/05/2013, 11:33 PM

## 2013-02-05 NOTE — ED Provider Notes (Signed)
History     CSN: [id number]  Arrival date & time 02/05/13  1658   First MD Initiated Contact with Patient 02/05/13 1728      Chief Complaint  Patient presents with  . Altered Mental Status    (Consider location/radiation/quality/duration/timing/severity/associated sxs/prior treatment) HPI  Zachary Walls is a 57 y.o. male who was brought in by EMS after he was found by his son on the floor with altered mental status. Pt uanble to give hisotry. Last seen normal yesterday. Tried calling pt's son several times, no answer, no answering machine.    Past Medical History  Diagnosis Date  . COPD (chronic obstructive pulmonary disease)   . Pneumonia   . Hypertension   . HOH (hard of hearing) 12/01/2011    Past Surgical History  Procedure Laterality Date  . Hernia repair      inguinal  . 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 Colostomy revision  12/11/2011    Procedure: COLON RESECTION SIGMOID;  Surgeon: Emelia Loron, MD;  Location: [facility];  Service: General;;  . Bowel resection  12/11/2011    Procedure: SMALL BOWEL RESECTION;  Surgeon: Emelia Loron, MD;  Location: [facility];  Service: General;;  times 2  . Cystoscopy w/ ureteral stent placement  12/11/2011    Procedure: CYSTOSCOPY WITH STENT REPLACEMENT;  Surgeon: Anner Crete, MD;  Location: [facility];  Service: Urology;  Laterality: Left;  ureteral catheter placement    Family History  Problem Relation Age of Onset  . Heart attack Father   . Cancer Mother   . HIV Brother     History  Substance Use Topics  . Smoking status: Current Every Day Smoker    Last Attempt to Quit: 11/03/2011  . Smokeless tobacco: Never Used  . Alcohol Use: Yes     Comment: occasionally      Review of Systems  Unable to perform ROS: Mental status change    Allergies  Codeine  Home Medications   Current Outpatient Rx  Name  Route  Sig   Dispense  Refill  . EXPIRED: albuterol-ipratropium (COMBIVENT) 18-103 MCG/ACT inhaler   Inhalation   Inhale 2 puffs into the lungs every 6 (six) hours.         [name] lamoTRIgine (LAMICTAL) 25 MG tablet   Oral   Take 25 mg by mouth 2 (two) times daily.          [name] LORazepam (ATIVAN) 0.5 MG tablet   Oral   Take 0.5 mg by mouth every 8 (eight) hours. Anxiety           BP 150/86  Pulse 95  Resp 18  SpO2 100%  Physical Exam  Nursing note and vitals reviewed. Constitutional: He appears well-developed and well-nourished.  Eyes: Conjunctivae are normal. Pupils are equal, round, and reactive to light.  Neck: Neck supple.  Cardiovascular: Normal rate, regular rhythm and normal heart sounds.   Pulmonary/Chest: Effort normal. No respiratory distress. He has wheezes. He has rales.  Coughing up and spiting clear mucus  Abdominal: Soft. Bowel sounds are normal. He exhibits no distension. There is no tenderness. There is no rebound.  Colostomy in LLQ.   Musculoskeletal: He exhibits no edema.  Neurological:  Pt is alert. Pt does not follow any directions. He is protecting airway, able to track finger. Not verbal.   Skin: Skin is warm and dry.  ED Course  Procedures (including critical care time)  Pt seen and evaluated. No family by bedside, unable to get in touch with them. Pt alert but unresponsive. Protecting airway. Labs and cts ordered.   Results for orders placed during the hospital encounter of 02/05/13  CBC WITH DIFFERENTIAL      Result Value Range   WBC 25.5 (*) 4.0 - 10.5 K/uL   RBC 4.53  4.22 - 5.81 MIL/uL   Hemoglobin 14.3  13.0 - 17.0 g/dL   HCT 16.1 (*) 09.6 - 04.5 %   MCV 82.3  78.0 - 100.0 fL   MCH 31.6  26.0 - 34.0 pg   MCHC 38.3 (*) 30.0 - 36.0 g/dL   RDW 40.9  81.1 - 91.4 %   Platelets 279  150 - 400 K/uL   Neutrophils Relative 89 (*) 43 - 77 %   Neutro Abs 22.7 (*) 1.7 - 7.7 K/uL   Lymphocytes Relative 3 (*) 12 - 46 %   Lymphs Abs 0.7  0.7 - 4.0 K/uL    Monocytes Relative 8  3 - 12 %   Monocytes Absolute 2.0 (*) 0.1 - 1.0 K/uL   Eosinophils Relative 0  0 - 5 %   Eosinophils Absolute 0.0  0.0 - 0.7 K/uL   Basophils Relative 0  0 - 1 %   Basophils Absolute 0.0  0.0 - 0.1 K/uL  COMPREHENSIVE METABOLIC PANEL      Result Value Range   Sodium 120 (*) 135 - 145 mEq/L   Potassium 3.8  3.5 - 5.1 mEq/L   Chloride 80 (*) 96 - 112 mEq/L   CO2 19  19 - 32 mEq/L   Glucose, Bld 88  70 - 99 mg/dL   BUN 19  6 - 23 mg/dL   Creatinine, Ser 7.82 (*) 0.50 - 1.35 mg/dL   Calcium 9.7  8.4 - 95.6 mg/dL   Total Protein 7.9  6.0 - 8.3 g/dL   Albumin 4.5  3.5 - 5.2 g/dL   AST 213 (*) 0 - 37 U/L   ALT 50  0 - 53 U/L   Alkaline Phosphatase 77  39 - 117 U/L   Total Bilirubin 0.9  0.3 - 1.2 mg/dL   GFR calc non Af Amer 49 (*) >90 mL/min   GFR calc Af Amer 57 (*) >90 mL/min  AMMONIA      Result Value Range   Ammonia 37  11 - 60 umol/L  URINALYSIS, ROUTINE W REFLEX MICROSCOPIC      Result Value Range   Color, Urine AMBER (*) YELLOW   APPearance TURBID (*) CLEAR   Specific Gravity, Urine 1.017  1.005 - 1.030   pH 5.5  5.0 - 8.0   Glucose, UA NEGATIVE  NEGATIVE mg/dL   Hgb urine dipstick LARGE (*) NEGATIVE   Bilirubin Urine NEGATIVE  NEGATIVE   Ketones, ur 15 (*) NEGATIVE mg/dL   Protein, ur >086 (*) NEGATIVE mg/dL   Urobilinogen, UA 0.2  0.0 - 1.0 mg/dL   Nitrite NEGATIVE  NEGATIVE   Leukocytes, UA NEGATIVE  NEGATIVE  ETHANOL      Result Value Range   Alcohol, Ethyl (B) <11  0 - 11 mg/dL  URINE RAPID DRUG SCREEN (HOSP PERFORMED)      Result Value Range   Opiates NONE DETECTED  NONE DETECTED   Cocaine NONE DETECTED  NONE DETECTED   Benzodiazepines NONE DETECTED  NONE DETECTED   Amphetamines NONE DETECTED  NONE DETECTED  Tetrahydrocannabinol POSITIVE (*) NONE DETECTED   Barbiturates NONE DETECTED  NONE DETECTED  URINE MICROSCOPIC-ADD ON      Result Value Range   Squamous Epithelial / LPF RARE  RARE   WBC, UA 0-2  <3 WBC/hpf   RBC / HPF 7-10  <3  RBC/hpf   Bacteria, UA FEW (*) RARE   Casts GRANULAR CAST (*) NEGATIVE   Urine-Other AMORPHOUS URATES/PHOSPHATES    CG4 I-STAT (LACTIC ACID)      Result Value Range   Lactic Acid, Venous 2.82 (*) 0.5 - 2.2 mmol/L  POCT I-STAT TROPONIN I      Result Value Range   Troponin i, poc 0.01  0.00 - 0.08 ng/mL   Comment 3            Dg Chest 2 View  02/05/2013  *RADIOLOGY REPORT*  Clinical Data: Altered mental status, combative patient  CHEST - 2 VIEW  Comparison: Portable chest x-ray of 12/14/2011  Findings: The lungs are hyperaerated consistent with emphysema. Minimal haziness is present at the left lung base which may simply represent atelectasis.  If symptoms persist or worsen a follow-up chest x-ray may be warranted to exclude pneumonia.  Mediastinal contours appear stable.  The heart is within normal limits in size. No acute bony abnormality is seen.  IMPRESSION: Emphysema.  No definite active process.  Probable mild atelectasis at the left lung base.   Original Report Authenticated By: Dwyane Dee, M.D.    Ct Head Wo Contrast  02/05/2013  *RADIOLOGY REPORT*  Clinical Data:  Altered mental status.  Patient found down.  CT HEAD WITHOUT CONTRAST CT CERVICAL SPINE WITHOUT CONTRAST  Technique:  Multidetector CT imaging of the head and cervical spine was performed following the standard protocol without intravenous contrast.  Multiplanar CT image reconstructions of the cervical spine were also generated.  Comparison:  Head and cervical spine CT scan 04/09/2011.  CT HEAD  Findings: No evidence of acute intracranial abnormality including infarct, hemorrhage, mass lesion, mass effect, midline shift or abnormal extra-axial fluid collection is identified.  There is no hydrocephalus or pneumocephalus.  Tiny amount of fluid in the left mastoid air cells is noted.  The calvarium is intact.  IMPRESSION: No acute abnormality.  CT CERVICAL SPINE  Findings: There is no fracture or subluxation of the cervical spine.   Intervertebral disc space height is maintained.  Lung apices demonstrate extensive emphysematous disease and biapical bulla.  IMPRESSION:  1.  No acute abnormality.  2.  Extensive emphysema with biapical bulla.   Original Report Authenticated By: Holley Dexter, M.D.    Ct Cervical Spine Wo Contrast  02/05/2013  *RADIOLOGY REPORT*  Clinical Data:  Altered mental status.  Patient found down.  CT HEAD WITHOUT CONTRAST CT CERVICAL SPINE WITHOUT CONTRAST  Technique:  Multidetector CT imaging of the head and cervical spine was performed following the standard protocol without intravenous contrast.  Multiplanar CT image reconstructions of the cervical spine were also generated.  Comparison:  Head and cervical spine CT scan 04/09/2011.  CT HEAD  Findings: No evidence of acute intracranial abnormality including infarct, hemorrhage, mass lesion, mass effect, midline shift or abnormal extra-axial fluid collection is identified.  There is no hydrocephalus or pneumocephalus.  Tiny amount of fluid in the left mastoid air cells is noted.  The calvarium is intact.  IMPRESSION: No acute abnormality.  CT CERVICAL SPINE  Findings: There is no fracture or subluxation of the cervical spine.  Intervertebral disc space height is maintained.  Lung apices demonstrate extensive emphysematous disease and biapical bulla.  IMPRESSION:  1.  No acute abnormality.  2.  Extensive emphysema with biapical bulla.   Original Report Authenticated By: Holley Dexter, M.D.      8:20 PM Son just got here. Apparently found pt today on the floor unresponsive. No hx of the same. Hx of alcohol abuse. Was apparently complaining of "some pain" in unknown area to the friend yesterday. Last seen normal last night. Pt did make a phone call today around 1pm based on phone record to the same friend. Pt's family state pt has been giong through a lot. Question drug over dose. Pt's ECG unremarkable. Temp 100 rectal. Lactic acid mildly elevated. Some  electrolyte abnormalities noted. Fluids running. vanc and zosyn ordered to cover for infection. Pt is hypertensive, otherwise normal VS. Will continue to monitor. Question CVA, vs SEPSIS, vs seizure vs drug overdose.    Spoke with [facility], asked for CT ab/pelvis and consult to critical care. Spoke with Critical care will come see pt.    Pt was seen by critical care, will admit.   1. Altered mental status   2. Hyponatremia   3. Leukocytosis   4. COPD (chronic obstructive pulmonary disease)   5. Malnutrition   6. Acute delirium   7. COPD exacerbation   8. AKI (acute kidney injury)     CRITICAL CARE Performed by: Jaynie Crumble A   Total critical care time: 45  Critical care time was exclusive of separately billable procedures and treating other patients.  Critical care was necessary to treat or prevent imminent or life-threatening deterioration.  Critical care was time spent personally by me on the following activities: development of treatment plan with patient and/or surrogate as well as nursing, discussions with consultants, evaluation of patient's response to treatment, examination of patient, obtaining history from patient or surrogate, ordering and performing treatments and interventions, ordering and review of laboratory studies, ordering and review of radiographic studies, pulse oximetry and re-evaluation of patient's condition.    MDM  Pt with acute altered mental status, heavy alcoholic. Last seen normal yesterday. Pt alert but not following any commands. He does respond to painful stimuli. CT head negative. Labs positive for WBC of 25, and NA of 120, CL 80. Normal saline started in ED. Bolus given along with 125mg /hr running. No banana bags available, pt not able to take PO medications at this time. He is protecting his airway. VS hypertensive otherwise stable.   Pt admitted to critical care for further management.   Filed Vitals:   02/05/13 2130 02/05/13 2200  02/05/13 2230 02/05/13 2300  BP: 146/75 140/79 169/85 170/84  Pulse: 93 96 100 99  Temp:      TempSrc:      Resp: 19 24 22 26   Height:    5' 6.93" (1.7 m)  Weight:    115 lb 1.3 oz (52.2 kg)  SpO2: 99% 98% 98% 98%           Lottie Mussel, PA-C 02/06/13 0133

## 2013-02-05 NOTE — ED Provider Notes (Signed)
8:52 PM  Date: 02/05/2013  Rate: 94  Rhythm: normal sinus rhythm  QRS Axis: normal  Intervals: normal PQRS:  Left atrial abnormality.  ST/T Wave abnormalities: normal  Conduction Disutrbances:none  Narrative Interpretation: Abnormal EKG  Old EKG Reviewed: unchanged    Carleene Cooper III, MD 02/05/13 [phone]

## 2013-02-05 NOTE — ED Notes (Signed)
According to [facility]...Marland KitchenSon saw patient yesterday- normal mental status. Today, son found patient lying on the floor next to his bed.

## 2013-02-06 ENCOUNTER — Inpatient Hospital Stay (HOSPITAL_COMMUNITY): Payer: Medicaid Other

## 2013-02-06 ENCOUNTER — Encounter (HOSPITAL_COMMUNITY): Payer: Self-pay | Admitting: Radiology

## 2013-02-06 DIAGNOSIS — I369 Nonrheumatic tricuspid valve disorder, unspecified: Secondary | ICD-10-CM

## 2013-02-06 DIAGNOSIS — J9600 Acute respiratory failure, unspecified whether with hypoxia or hypercapnia: Secondary | ICD-10-CM

## 2013-02-06 LAB — TROPONIN I: Troponin I: 0.87 ng/mL (ref <0.30)

## 2013-02-06 LAB — BASIC METABOLIC PANEL
BUN: 21 mg/dL (ref 6–23)
BUN: 21 mg/dL (ref 6–23)
CO2: 23 mEq/L (ref 19–32)
CO2: 24 mEq/L (ref 19–32)
CO2: 22 mEq/L (ref 19–32)
Calcium: 7.7 mg/dL — ABNORMAL LOW (ref 8.4–10.5)
Calcium: 7.3 mg/dL — ABNORMAL LOW (ref 8.4–10.5)
Chloride: 95 mEq/L — ABNORMAL LOW (ref 96–112)
Creatinine, Ser: 1.73 mg/dL — ABNORMAL HIGH (ref 0.50–1.35)
GFR calc non Af Amer: 44 mL/min — ABNORMAL LOW (ref >90)
Glucose, Bld: 145 mg/dL — ABNORMAL HIGH (ref 70–99)
Glucose, Bld: 153 mg/dL — ABNORMAL HIGH (ref 70–99)
Potassium: 3.9 mEq/L (ref 3.5–5.1)
Sodium: 130 mEq/L — ABNORMAL LOW (ref 135–145)

## 2013-02-06 LAB — OSMOLALITY, URINE: Osmolality, Ur: 362 mOsm/kg — ABNORMAL LOW (ref 390–1090)

## 2013-02-06 LAB — GLUCOSE, CAPILLARY: Glucose-Capillary: 165 mg/dL — ABNORMAL HIGH (ref 70–99)

## 2013-02-06 LAB — BLOOD GAS, ARTERIAL
MECHVT: 500 mL
O2 Saturation: 97.8 %
PEEP: 5 cmH2O
Patient temperature: 98.6
pH, Arterial: 6.972 — CL (ref 7.350–7.450)

## 2013-02-06 LAB — CBC
HCT: 33.9 % — ABNORMAL LOW (ref 39.0–52.0)
Hemoglobin: 12.5 g/dL — ABNORMAL LOW (ref 13.0–17.0)
MCH: 31 pg (ref 26.0–34.0)
MCV: 84.1 fL (ref 78.0–100.0)
RBC: 4.03 MIL/uL — ABNORMAL LOW (ref 4.22–5.81)

## 2013-02-06 LAB — POCT I-STAT 3, ART BLOOD GAS (G3+)
Bicarbonate: 20 mEq/L (ref 20.0–24.0)
TCO2: 23 mmol/L (ref 0–100)
TCO2: 21 mmol/L (ref 0–100)
pCO2 arterial: 43.4 mmHg (ref 35.0–45.0)
pCO2 arterial: 37.6 mmHg (ref 35.0–45.0)
pH, Arterial: 7.302 — ABNORMAL LOW (ref 7.350–7.450)
pO2, Arterial: 121 mmHg — ABNORMAL HIGH (ref 80.0–100.0)
pO2, Arterial: 77 mmHg — ABNORMAL LOW (ref 80.0–100.0)

## 2013-02-06 LAB — HEPATIC FUNCTION PANEL
ALT: 47 U/L (ref 0–53)
AST: 249 U/L — ABNORMAL HIGH (ref 0–37)
Bilirubin, Direct: 0.2 mg/dL (ref 0.0–0.3)
Indirect Bilirubin: 0.2 mg/dL — ABNORMAL LOW (ref 0.3–0.9)
Total Bilirubin: 0.4 mg/dL (ref 0.3–1.2)

## 2013-02-06 LAB — CORTISOL: Cortisol, Plasma: 62 ug/dL

## 2013-02-06 LAB — SALICYLATE LEVEL: Salicylate Lvl: <2 mg/dL — ABNORMAL LOW (ref 2.8–20.0)

## 2013-02-06 MED ORDER — ALBUTEROL SULFATE (5 MG/ML) 0.5% IN NEBU
2.5000 mg | INHALATION_SOLUTION | RESPIRATORY_TRACT | Status: DC
  Administered 2013-02-06 – 2013-02-13 (×42): 2.5 mg via RESPIRATORY_TRACT
  Filled 2013-02-06 (×41): qty 0.5

## 2013-02-06 MED ORDER — LEVOFLOXACIN IN D5W 750 MG/150ML IV SOLN: 750.0000 mg | INTRAVENOUS | Status: DC

## 2013-02-06 MED ORDER — SODIUM BICARBONATE 8.4 % IV SOLN
50.0000 meq | Freq: Once | INTRAVENOUS | Status: AC
  Administered 2013-02-06: 50 meq via INTRAVENOUS

## 2013-02-06 MED ORDER — VANCOMYCIN HCL IN DEXTROSE 750-5 MG/150ML-% IV SOLN
750.0000 mg | INTRAVENOUS | Status: DC
  Administered 2013-02-06 – 2013-02-07 (×2): 750 mg via INTRAVENOUS
  Filled 2013-02-06 (×3): qty 150

## 2013-02-06 MED ORDER — METHYLPREDNISOLONE SODIUM SUCC 125 MG IJ SOLR
60.0000 mg | Freq: Two times a day (BID) | INTRAMUSCULAR | Status: DC
  Administered 2013-02-06 – 2013-02-08 (×5): 60 mg via INTRAVENOUS
  Filled 2013-02-06 (×7): qty 0.96

## 2013-02-06 MED ORDER — IPRATROPIUM BROMIDE 0.02 % IN SOLN
0.5000 mg | RESPIRATORY_TRACT | Status: DC
  Administered 2013-02-06 (×5): 0.5 mg via RESPIRATORY_TRACT
  Filled 2013-02-06 (×4): qty 2.5

## 2013-02-06 MED ORDER — NOREPINEPHRINE BITARTRATE 1 MG/ML IJ SOLN
2.0000 ug/min | INTRAVENOUS | Status: DC
  Administered 2013-02-06: 2 ug/min via INTRAVENOUS

## 2013-02-06 MED ORDER — ALBUTEROL SULFATE (5 MG/ML) 0.5% IN NEBU
INHALATION_SOLUTION | RESPIRATORY_TRACT | Status: AC
  Administered 2013-02-06: 7.5 mg via RESPIRATORY_TRACT
  Filled 2013-02-06: qty 1

## 2013-02-06 MED ORDER — PANTOPRAZOLE SODIUM 40 MG IV SOLR
40.0000 mg | Freq: Every day | INTRAVENOUS | Status: DC
  Administered 2013-02-06 – 2013-02-08 (×4): 40 mg via INTRAVENOUS
  Filled 2013-02-06 (×5): qty 40

## 2013-02-06 MED ORDER — ASPIRIN 300 MG RE SUPP: 300.0000 mg | RECTAL | Status: AC

## 2013-02-06 MED ORDER — ASPIRIN 81 MG PO CHEW
324.0000 mg | CHEWABLE_TABLET | ORAL | Status: AC
  Administered 2013-02-06: 324 mg via ORAL
  Filled 2013-02-06: qty 4

## 2013-02-06 MED ORDER — PRO-STAT SUGAR FREE PO LIQD
30.0000 mL | Freq: Two times a day (BID) | ORAL | Status: DC
  Administered 2013-02-06 – 2013-02-13 (×15): 30 mL
  Filled 2013-02-06 (×16): qty 30

## 2013-02-06 MED ORDER — LORAZEPAM 2 MG/ML IJ SOLN
1.0000 mg | Freq: Four times a day (QID) | INTRAMUSCULAR | Status: DC | PRN
  Administered 2013-02-06: 1 mg via INTRAMUSCULAR
  Filled 2013-02-06: qty 1

## 2013-02-06 MED ORDER — ALBUTEROL SULFATE (5 MG/ML) 0.5% IN NEBU: 7.5000 mg | INHALATION_SOLUTION | Freq: Once | RESPIRATORY_TRACT | Status: AC

## 2013-02-06 MED ORDER — HEPARIN SODIUM (PORCINE) 5000 UNIT/ML IJ SOLN
5000.0000 [IU] | Freq: Three times a day (TID) | INTRAMUSCULAR | Status: DC
  Administered 2013-02-06 – 2013-02-23 (×52): 5000 [IU] via SUBCUTANEOUS
  Filled 2013-02-06 (×58): qty 1

## 2013-02-06 MED ORDER — IPRATROPIUM BROMIDE 0.02 % IN SOLN
0.5000 mg | RESPIRATORY_TRACT | Status: DC
  Administered 2013-02-06 – 2013-02-09 (×19): 0.5 mg via RESPIRATORY_TRACT
  Filled 2013-02-06 (×16): qty 2.5

## 2013-02-06 MED ORDER — ALBUTEROL SULFATE (5 MG/ML) 0.5% IN NEBU
2.5000 mg | INHALATION_SOLUTION | RESPIRATORY_TRACT | Status: DC
  Administered 2013-02-06: 2.5 mg via RESPIRATORY_TRACT

## 2013-02-06 MED ORDER — FENTANYL CITRATE 0.05 MG/ML IJ SOLN
50.0000 ug | INTRAMUSCULAR | Status: DC | PRN
  Administered 2013-02-06: 200 ug via INTRAVENOUS

## 2013-02-06 MED ORDER — OSMOLITE 1.2 CAL PO LIQD
1000.0000 mL | ORAL | Status: DC
  Filled 2013-02-06 (×2): qty 1000

## 2013-02-06 MED ORDER — THIAMINE HCL 100 MG/ML IJ SOLN
500.0000 mg | Freq: Three times a day (TID) | INTRAVENOUS | Status: AC
  Administered 2013-02-07 – 2013-02-09 (×9): 500 mg via INTRAVENOUS
  Filled 2013-02-06 (×9): qty 5

## 2013-02-06 MED ORDER — FENTANYL BOLUS VIA INFUSION
25.0000 ug | Freq: Four times a day (QID) | INTRAVENOUS | Status: DC | PRN
  Filled 2013-02-06 (×2): qty 100

## 2013-02-06 MED ORDER — OSMOLITE 1.2 CAL PO LIQD
1000.0000 mL | ORAL | Status: DC
  Administered 2013-02-06: 1000 mL
  Filled 2013-02-06 (×3): qty 1000

## 2013-02-06 MED ORDER — SODIUM CHLORIDE 0.9 % IV SOLN
Freq: Once | INTRAVENOUS | Status: AC
  Administered 2013-02-06: 150 mL/h via INTRAVENOUS

## 2013-02-06 MED ORDER — DEXTROSE 50 % IV SOLN: 1.0000 | Freq: Once | INTRAVENOUS | Status: DC

## 2013-02-06 MED ORDER — ROCURONIUM BROMIDE 50 MG/5ML IV SOLN
70.0000 mg | Freq: Once | INTRAVENOUS | Status: AC
  Administered 2013-02-06: 70 mg via INTRAVENOUS

## 2013-02-06 MED ORDER — SODIUM CHLORIDE 0.9 % IV SOLN
25.0000 ug/h | INTRAVENOUS | Status: DC
  Administered 2013-02-06: 100 ug/h via INTRAVENOUS
  Administered 2013-02-06: 150 ug/h via INTRAVENOUS
  Administered 2013-02-07 – 2013-02-08 (×2): 100 ug/h via INTRAVENOUS
  Administered 2013-02-09: 25 ug/h via INTRAVENOUS
  Administered 2013-02-09: 100 ug/h via INTRAVENOUS
  Administered 2013-02-09: 150 ug/h via INTRAVENOUS
  Filled 2013-02-06 (×6): qty 50

## 2013-02-06 MED ORDER — SODIUM CHLORIDE 0.9 % IV SOLN
750.0000 mg | Freq: Once | INTRAVENOUS | Status: AC
  Administered 2013-02-06: 750 mg via INTRAVENOUS
  Filled 2013-02-06: qty 0.75

## 2013-02-06 MED ORDER — SODIUM CHLORIDE 0.9 % IV SOLN
1.0000 mg/h | INTRAVENOUS | Status: DC
  Administered 2013-02-06 – 2013-02-07 (×2): 2 mg/h via INTRAVENOUS
  Administered 2013-02-09: 4 mg/h via INTRAVENOUS
  Administered 2013-02-09: 1 mg/h via INTRAVENOUS
  Administered 2013-02-09: 5 mg/h via INTRAVENOUS
  Administered 2013-02-09: 3 mg/h via INTRAVENOUS
  Administered 2013-02-09: 4 mg/h via INTRAVENOUS
  Administered 2013-02-10: 5 mg/h via INTRAVENOUS
  Filled 2013-02-06 (×8): qty 10

## 2013-02-06 MED ORDER — HYDRALAZINE HCL 20 MG/ML IJ SOLN
10.0000 mg | Freq: Four times a day (QID) | INTRAMUSCULAR | Status: DC | PRN
  Filled 2013-02-06: qty 1

## 2013-02-06 MED ORDER — LEVOFLOXACIN IN D5W 750 MG/150ML IV SOLN
750.0000 mg | Freq: Once | INTRAVENOUS | Status: DC
  Administered 2013-02-06: 750 mg via INTRAVENOUS
  Filled 2013-02-06: qty 150

## 2013-02-06 MED ORDER — PIPERACILLIN-TAZOBACTAM 3.375 G IVPB
3.3750 g | Freq: Three times a day (TID) | INTRAVENOUS | Status: DC
  Filled 2013-02-06 (×2): qty 50

## 2013-02-06 MED ORDER — SODIUM CHLORIDE 0.9 % IV SOLN
500.0000 mg | Freq: Two times a day (BID) | INTRAVENOUS | Status: DC
  Filled 2013-02-06: qty 0.5

## 2013-02-06 MED ORDER — PIPERACILLIN-TAZOBACTAM 3.375 G IVPB
3.3750 g | Freq: Three times a day (TID) | INTRAVENOUS | Status: DC
  Administered 2013-02-06 – 2013-02-08 (×6): 3.375 g via INTRAVENOUS
  Filled 2013-02-06 (×7): qty 50

## 2013-02-06 MED ORDER — MIDAZOLAM HCL 2 MG/2ML IJ SOLN
2.0000 mg | Freq: Once | INTRAMUSCULAR | Status: AC
  Administered 2013-02-06: 4 mg via INTRAVENOUS

## 2013-02-06 MED ORDER — NOREPINEPHRINE BITARTRATE 1 MG/ML IJ SOLN
2.0000 ug/min | INTRAVENOUS | Status: DC
  Filled 2013-02-06: qty 16

## 2013-02-06 MED ORDER — POTASSIUM CHLORIDE 20 MEQ/15ML (10%) PO LIQD
40.0000 meq | Freq: Once | ORAL | Status: AC
  Administered 2013-02-06: 40 meq
  Filled 2013-02-06: qty 30

## 2013-02-06 MED ORDER — FOLIC ACID 5 MG/ML IJ SOLN
1.0000 mg | Freq: Every day | INTRAMUSCULAR | Status: DC
  Filled 2013-02-06: qty 0.2

## 2013-02-06 MED ORDER — POTASSIUM CHLORIDE 20 MEQ/15ML (10%) PO LIQD
ORAL | Status: AC
  Filled 2013-02-06: qty 30

## 2013-02-06 MED ORDER — ALBUTEROL SULFATE (5 MG/ML) 0.5% IN NEBU
5.0000 mg | INHALATION_SOLUTION | RESPIRATORY_TRACT | Status: DC
  Administered 2013-02-06: 5 mg via RESPIRATORY_TRACT
  Administered 2013-02-06: 2.5 mg via RESPIRATORY_TRACT
  Administered 2013-02-06: 5 mg via RESPIRATORY_TRACT
  Filled 2013-02-06 (×2): qty 1
  Filled 2013-02-06: qty 0.5

## 2013-02-06 MED ORDER — ALBUTEROL SULFATE (5 MG/ML) 0.5% IN NEBU
2.5000 mg | INHALATION_SOLUTION | RESPIRATORY_TRACT | Status: DC
  Administered 2013-02-06: 2.5 mg via RESPIRATORY_TRACT
  Filled 2013-02-06: qty 0.5

## 2013-02-06 MED ORDER — THIAMINE HCL 100 MG/ML IJ SOLN: 100.0000 mg | Freq: Once | INTRAMUSCULAR | Status: DC

## 2013-02-06 MED ORDER — SODIUM CHLORIDE 0.9 % IV SOLN: 250.0000 mL | INTRAVENOUS | Status: DC | PRN

## 2013-02-06 MED ORDER — ADULT MULTIVITAMIN LIQUID CH
5.0000 mL | Freq: Every day | ORAL | Status: DC
  Administered 2013-02-06 – 2013-02-23 (×17): 5 mL
  Filled 2013-02-06 (×18): qty 5

## 2013-02-06 MED ORDER — VECURONIUM BROMIDE 10 MG IV SOLR
10.0000 mg | Freq: Once | INTRAVENOUS | Status: AC
  Administered 2013-02-06: 10 mg via INTRAVENOUS
  Filled 2013-02-06 (×2): qty 10

## 2013-02-06 MED ORDER — MIDAZOLAM BOLUS VIA INFUSION
1.0000 mg | INTRAVENOUS | Status: DC | PRN
  Filled 2013-02-06 (×2): qty 2

## 2013-02-06 MED ORDER — LORAZEPAM 1 MG PO TABS: 1.0000 mg | ORAL_TABLET | Freq: Four times a day (QID) | ORAL | Status: DC | PRN

## 2013-02-06 MED ORDER — THIAMINE HCL 100 MG/ML IJ SOLN
500.0000 mg | Freq: Three times a day (TID) | INTRAMUSCULAR | Status: DC
  Filled 2013-02-06 (×2): qty 5

## 2013-02-06 NOTE — ED Notes (Signed)
Attempted to call report to floor.  Was told RN would return my call 

## 2013-02-06 NOTE — Progress Notes (Signed)
  Echocardiogram 2D Echocardiogram has been performed.  [place]ian Co 02/06/2013, 5:53 PM
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
## 2013-02-06 NOTE — Progress Notes (Signed)
Attempted to obtain ABG, was unsuccessful.

## 2013-02-06 NOTE — Progress Notes (Signed)
[facility] PCCM  Osmolar gap is only 10, very unlikely to be ethylene glycol toxicity.  D/C fomepizole.  Yolonda Kida PCCM Pager: [phone] Cell: [phone] If no response, call [phone]

## 2013-02-06 NOTE — Progress Notes (Signed)
ANTIBIOTIC CONSULT NOTE - INITIAL  Pharmacy Consult for Zosyn Indication: Empiric, UTI  Allergies  Allergen Reactions  . Codeine Rash    Patient Measurements: Height: 5' 6.93" (170 cm) Weight: 117 lb 1 oz (53.1 kg) IBW/kg (Calculated) : 65.94  Vital Signs: Temp: 98.1 F (36.7 C) (04/18 1215) Temp src: Oral (04/18 0757) BP: 106/66 mmHg (04/18 1245) Pulse Rate: 88 (04/18 1245) Intake/Output from previous day: 04/17 0701 - 04/18 0700 In: 2035.1 [I.V.:1975.1; NG/GT:60] Out: 900 [Urine:900] Intake/Output from this shift: Total I/O In: 1030.8 [I.V.:850.8; NG/GT:180] Out: 550 [Urine:550]  Labs:  Recent Labs  02/05/13 1830  02/05/13 1858 02/05/13 2254 02/06/13 0500 02/06/13 1033  WBC  --   --  25.5*  --  18.2*  --   HGB  --   --  14.3  --  12.5*  --   PLT  --   --  279  --  252  --   LABCREA 150.80  --   --   --   --   --   CREATININE  --   < > 1.53* 1.57* 1.58* 1.73*  < > = values in this interval not displayed. Estimated Creatinine Clearance: 35.8 ml/min (by C-G formula based on Cr of 1.73). No results found for this basename: VANCOTROUGH, Leodis Binet, VANCORANDOM, GENTTROUGH, GENTPEAK, GENTRANDOM, TOBRATROUGH, TOBRAPEAK, TOBRARND, AMIKACINPEAK, AMIKACINTROU, AMIKACIN,  in the last 72 hours   Microbiology: Recent Results (from the past 720 hour(s))  CULTURE, RESPIRATORY (NON-EXPECTORATED)     Status: None   Collection Time    02/06/13  2:48 AM      Result Value Range Status   Specimen Description ENDOTRACHEAL   Final   Special Requests NONE   Final   Gram Stain     Final   Value: FEW WBC PRESENT, PREDOMINANTLY PMN     RARE SQUAMOUS EPITHELIAL CELLS PRESENT     RARE GRAM POSITIVE COCCI     IN PAIRS   Culture PENDING   Incomplete   Report Status PENDING   Incomplete  MRSA PCR SCREENING     Status: None   Collection Time    02/06/13  3:33 AM      Result Value Range Status   MRSA by PCR NEGATIVE  NEGATIVE Final   Comment:            The GeneXpert MRSA Assay  (FDA     approved for NASAL specimens     only), is one component of a     comprehensive MRSA colonization     surveillance program. It is not     intended to diagnose MRSA     infection nor to guide or     monitor treatment for     MRSA infections.  CLOSTRIDIUM DIFFICILE BY PCR     Status: None   Collection Time    02/06/13 10:15 AM      Result Value Range Status   C difficile by pcr NEGATIVE  NEGATIVE Final    Medical History: Past Medical History  Diagnosis Date  . COPD (chronic obstructive pulmonary disease)   . Pneumonia   . Hypertension   . HOH (hard of hearing) 12/01/2011    Medications:  Prescriptions prior to admission  Medication Sig Dispense Refill  . albuterol-ipratropium (COMBIVENT) 18-103 MCG/ACT inhaler Inhale 2 puffs into the lungs every 6 (six) hours as needed for wheezing or shortness of breath.       . lamoTRIgine (LAMICTAL) 25 MG tablet Take 25  mg by mouth 2 (two) times daily.       [name] LORazepam (ATIVAN) 0.5 MG tablet Take 0.5 mg by mouth every 8 (eight) hours. Anxiety        Admit Complaint: 57 y.o. male admitted 02/05/2013 with emphysema, EtoH, subabuse, found down at home 4/17 at home. Pharmacy consulted to dose zosyn and vanc  Assessment: Infectious Disease: WBC 18.2 (down to 25), PCT 3.68, sepsis, emipric GI coverage Antibiotics: 4/17 >>Zosyn 4/18 >> Vanc Cultures: 4/18 TA, GPC  Goal of Therapy:  Renal adjustment of antibiotics  Plan:  1. Zosyn 3.375 g IV q8h 2. Vancomycin 750 mg IV q24h 2. Follow up SCr, UOP, cultures, clinical course and adjust as clinically indicated.  Thank you for allowing pharmacy to be a part of this patients care team.  Lovenia Kim Pharm.D., BCPS Clinical Pharmacist 02/06/2013 1:31 PM Pager: [phone] Phone: [phone]

## 2013-02-06 NOTE — Procedures (Signed)
Intubation Procedure Note BENECIO KLUGER [id number] October 08, 1956  Procedure: Intubation Indications: Airway protection and maintenance  Procedure Details Consent: Unable to obtain consent because of emergent medical necessity. Time Out: Verified patient identification, verified procedure, site/side was marked, verified correct patient position, special equipment/implants available, medications/allergies/relevent history reviewed, required imaging and test results available.  Performed  Maximum sterile technique was used including gloves and hand hygiene.  MAC and 3    Evaluation Hemodynamic Status: Patient Being Coded at time of intubation; O2 sats: Compressions being done no Sat able to read Patient's Current Condition: stable Complications: No apparent complications Patient did tolerate procedure well. Chest X-ray ordered to verify placement.  CXR: pending.  Assisted by Marlowe Aschoff RRT,RCP Vilinda Blanks, Burna Cash 02/06/2013

## 2013-02-06 NOTE — Consult Note (Signed)
Reason for Consult: Found down Referring Physician: Dr. Marchelle Gearing  CC: found down, s/p arrest  HPI: Zachary Walls is an 57 y.o. male who last talked to his family on 02/04/2013. His son-in-law stopped by his house 02/05/2013 at 1600. He found him lying in the floor in a fetal position, "ash colored, and barely breathing". He was brought in by EMS. Upon arrival patient had respiratory arrest, followed by cardiac arrest (7 minutes of documented CPR) and was intubated early 02/06/2013. Since that time family describes him as having episodes of stiffening. He is undergoing continous EEG monitoring.  Mild hyponatremia, 130. Significant History of alcohol abuse, > 12 beers daily. MCV normal.  Past Medical History  Diagnosis Date  . COPD (chronic obstructive pulmonary disease)   . Pneumonia   . Hypertension   . HOH (hard of hearing) 12/01/2011    Past Surgical History  Procedure Laterality Date  . Hernia repair      inguinal  . 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 Colostomy revision  12/11/2011    Procedure: COLON RESECTION SIGMOID;  Surgeon: Emelia Loron, MD;  Location: [facility];  Service: General;;  . Bowel resection  12/11/2011    Procedure: SMALL BOWEL RESECTION;  Surgeon: Emelia Loron, MD;  Location: [facility];  Service: General;;  times 2  . Cystoscopy w/ ureteral stent placement  12/11/2011    Procedure: CYSTOSCOPY WITH STENT REPLACEMENT;  Surgeon: Anner Crete, MD;  Location: [facility];  Service: Urology;  Laterality: Left;  ureteral catheter placement    Family History  Problem Relation Age of Onset  . Heart attack Father   . Cancer Mother   . HIV Brother     Social History: Reportedly drinks > 12 beers daily. No known illiicit drug use.  Allergies  Allergen Reactions  . Codeine Rash    Current Facility-Administered Medications  Medication Dose Route Frequency Provider Last  Rate Last Dose  . 0.9 %  sodium chloride infusion  250 mL Intravenous PRN Elyse Jarvis, MD      . albuterol (PROVENTIL) (5 MG/ML) 0.5% nebulizer solution 2.5 mg  2.5 mg Nebulization Q4H Nelda Bucks, MD   2.5 mg at 02/06/13 1637  . dextrose 50 % solution 50 mL  1 ampule Intravenous Once Tatyana A Kirichenko, PA-C      . feeding supplement (OSMOLITE 1.2 CAL) liquid 1,000 mL  1,000 mL Per Tube Continuous Haynes Bast, RD 15 mL/hr at 02/06/13 1603 1,000 mL at 02/06/13 1603  . feeding supplement (PRO-STAT SUGAR FREE 64) liquid 30 mL  30 mL Per Tube BID Haynes Bast, RD   30 mL at 02/06/13 1239  . fentaNYL (SUBLIMAZE) 10 mcg/mL in sodium chloride 0.9 % 250 mL infusion  25-400 mcg/hr Intravenous Titrated Christen Bame, MD 10 mL/hr at 02/06/13 0144 100 mcg/hr at 02/06/13 0144   And  . fentaNYL (SUBLIMAZE) bolus via infusion 25-100 mcg  25-100 mcg Intravenous Q6H PRN Christen Bame, MD      . fentaNYL (SUBLIMAZE) injection 50-100 mcg  50-100 mcg Intravenous Q2H PRN Christen Bame, MD   200 mcg at 02/06/13 0100  . folic acid (FOLVITE) tablet 1 mg  1 mg Oral Daily Elyse Jarvis, MD   1 mg at 02/06/13 1022  . heparin injection 5,000 Units  5,000 Units Subcutaneous Q8H Elyse Jarvis, MD   5,000 Units at 02/06/13 1431  .  hydrALAZINE (APRESOLINE) injection 10 mg  10 mg Intravenous Q6H PRN Elyse Jarvis, MD      . ipratropium (ATROVENT) nebulizer solution 0.5 mg  0.5 mg Nebulization Q4H Nelda Bucks, MD   0.5 mg at 02/06/13 1637  . LORazepam (ATIVAN) tablet 1 mg  1 mg Oral Q6H PRN Elyse Jarvis, MD       Or  . LORazepam (ATIVAN) injection 1 mg  1 mg Intramuscular Q6H PRN Elyse Jarvis, MD      . methylPREDNISolone sodium succinate (SOLU-MEDROL) 125 mg/2 mL injection 60 mg  60 mg Intravenous Q12H Elyse Jarvis, MD   60 mg at 02/06/13 1432  . midazolam (VERSED) 1 mg/mL in sodium chloride 0.9 % 50 mL infusion  1-10 mg/hr Intravenous Titrated Elyse Jarvis, MD 2 mL/hr at 02/06/13 0145 2 mg/hr at 02/06/13  0145   And  . midazolam (VERSED) bolus via infusion 1-2 mg  1-2 mg Intravenous Q2H PRN Elyse Jarvis, MD      . multivitamin liquid 5 mL  5 mL Per Tube Daily Haynes Bast, RD   5 mL at 02/06/13 1239  . norepinephrine (LEVOPHED) 16 mg in dextrose 5 % 250 mL infusion  2-50 mcg/min Intravenous Titrated Elyse Jarvis, MD 1.9 mL/hr at 02/06/13 0146 2 mcg/min at 02/06/13 0146  . pantoprazole (PROTONIX) injection 40 mg  40 mg Intravenous QHS Elyse Jarvis, MD   40 mg at 02/06/13 0254  . piperacillin-tazobactam (ZOSYN) IVPB 3.375 g  3.375 g Intravenous Q8H Nelda Bucks, MD   3.375 g at 02/06/13 1432  . potassium chloride 20 MEQ/15ML (10%) liquid           . thiamine (B-1) injection 100 mg  100 mg Intravenous Daily Elyse Jarvis, MD   100 mg at 02/06/13 1022  . vancomycin (VANCOCIN) IVPB 750 mg/150 ml premix  750 mg Intravenous Q24H Nelda Bucks, MD   750 mg at 02/06/13 1432   ROS: History obtained from Unable to assess secondary to patient's altered mental status.     Physical Examination: Blood pressure 103/60, pulse 74, temperature 98.1 F (36.7 C), temperature source Oral, resp. rate 20, height 5' 6.93" (1.7 m), weight 53.1 kg (117 lb 1 oz), SpO2 98.00%.  Neurologic Examination Mental Status: Eyes open spontaneously. Patient does not respond to verbal stimuli.  Does not respond to deep sternal rub.  Does not follow commands.  No verbalizations are noted.   Cranial Nerves: II: patient does not blink to threat,  pupils right 2 mm, left 2 mm reactive bilaterally III,IV,VI: dolls eyes negative but patient is awake so less reliable. Eyes slighlty dysconjugate.  V,VII: corneal reflex present bilaterally  VIII: patient does not respond to verbal stimuli IX,X: cough reflex absent,  XI: unable to test bilaterally due to coma XII: unable to test due to coma  Motor: Extremities flaccid throughout.  No spontaneous movement noted. Withdraws minimally to nailbed pressure in bilateral  UE, also   Sensory: Does not respond to noxious stimuli in any extremity.  Deep Tendon Reflexes:  2+ and symmetric at biceps, 3+ and symmetric at knees  Plantars: down bilaterally  Cerebellar: Unable to perform due to coma  Gait: Unable to perform due to coma  Results for orders placed during the hospital encounter of 02/05/13 (from the past 48 hour(s))  URINALYSIS, ROUTINE W REFLEX MICROSCOPIC     Status: Abnormal   Collection Time    02/05/13  6:30 PM      Result Value  Range   Color, Urine AMBER (*) YELLOW   Comment: BIOCHEMICALS MAY BE AFFECTED BY COLOR   APPearance TURBID (*) CLEAR   Specific Gravity, Urine 1.017  1.005 - 1.030   pH 5.5  5.0 - 8.0   Glucose, UA NEGATIVE  NEGATIVE mg/dL   Hgb urine dipstick LARGE (*) NEGATIVE   Bilirubin Urine NEGATIVE  NEGATIVE   Ketones, ur 15 (*) NEGATIVE mg/dL   Protein, ur >454 (*) NEGATIVE mg/dL   Urobilinogen, UA 0.2  0.0 - 1.0 mg/dL   Nitrite NEGATIVE  NEGATIVE   Leukocytes, UA NEGATIVE  NEGATIVE  URINE MICROSCOPIC-ADD ON     Status: Abnormal   Collection Time    02/05/13  6:30 PM      Result Value Range   Squamous Epithelial / LPF RARE  RARE   WBC, UA 0-2  <3 WBC/hpf   RBC / HPF 7-10  <3 RBC/hpf   Bacteria, UA FEW (*) RARE   Casts GRANULAR CAST (*) NEGATIVE   Urine-Other AMORPHOUS URATES/PHOSPHATES    SODIUM, URINE, RANDOM     Status: None   Collection Time    02/05/13  6:30 PM      Result Value Range   Sodium, Ur 23    CREATININE, URINE, RANDOM     Status: None   Collection Time    02/05/13  6:30 PM      Result Value Range   Creatinine, Urine 150.80    OSMOLALITY, URINE     Status: Abnormal   Collection Time    02/05/13  6:30 PM      Result Value Range   Osmolality, Ur 362 (*) 390 - 1090 mOsm/kg  URINE RAPID DRUG SCREEN (HOSP PERFORMED)     Status: Abnormal   Collection Time    02/05/13  6:31 PM      Result Value Range   Opiates NONE DETECTED  NONE DETECTED   Cocaine NONE DETECTED  NONE DETECTED    Benzodiazepines NONE DETECTED  NONE DETECTED   Amphetamines NONE DETECTED  NONE DETECTED   Tetrahydrocannabinol POSITIVE (*) NONE DETECTED   Barbiturates NONE DETECTED  NONE DETECTED   Comment:            DRUG SCREEN FOR MEDICAL PURPOSES     ONLY.  IF CONFIRMATION IS NEEDED     FOR ANY PURPOSE, NOTIFY LAB     WITHIN 5 DAYS.                LOWEST DETECTABLE LIMITS     FOR URINE DRUG SCREEN     Drug Class       Cutoff (ng/mL)     Amphetamine      1000     Barbiturate      200     Benzodiazepine   200     Tricyclics       300     Opiates          300     Cocaine          300     THC              50  CBC WITH DIFFERENTIAL     Status: Abnormal   Collection Time    02/05/13  6:58 PM      Result Value Range   WBC 25.5 (*) 4.0 - 10.5 K/uL   Comment: WHITE COUNT CONFIRMED ON SMEAR   RBC 4.53  4.22 - 5.81 MIL/uL  Hemoglobin 14.3  13.0 - 17.0 g/dL   HCT 16.1 (*) 09.6 - 04.5 %   MCV 82.3  78.0 - 100.0 fL   MCH 31.6  26.0 - 34.0 pg   MCHC 38.3 (*) 30.0 - 36.0 g/dL   Comment: RULED OUT INTERFERING SUBSTANCES   RDW 13.5  11.5 - 15.5 %   Platelets 279  150 - 400 K/uL   Neutrophils Relative 89 (*) 43 - 77 %   Neutro Abs 22.7 (*) 1.7 - 7.7 K/uL   Lymphocytes Relative 3 (*) 12 - 46 %   Lymphs Abs 0.7  0.7 - 4.0 K/uL   Monocytes Relative 8  3 - 12 %   Monocytes Absolute 2.0 (*) 0.1 - 1.0 K/uL   Eosinophils Relative 0  0 - 5 %   Eosinophils Absolute 0.0  0.0 - 0.7 K/uL   Basophils Relative 0  0 - 1 %   Basophils Absolute 0.0  0.0 - 0.1 K/uL  COMPREHENSIVE METABOLIC PANEL     Status: Abnormal   Collection Time    02/05/13  6:58 PM      Result Value Range   Sodium 120 (*) 135 - 145 mEq/L   Potassium 3.8  3.5 - 5.1 mEq/L   Chloride 80 (*) 96 - 112 mEq/L   CO2 19  19 - 32 mEq/L   Glucose, Bld 88  70 - 99 mg/dL   BUN 19  6 - 23 mg/dL   Creatinine, Ser 4.09 (*) 0.50 - 1.35 mg/dL   Calcium 9.7  8.4 - 81.1 mg/dL   Total Protein 7.9  6.0 - 8.3 g/dL   Albumin 4.5  3.5 - 5.2 g/dL   AST  914 (*) 0 - 37 U/L   ALT 50  0 - 53 U/L   Alkaline Phosphatase 77  39 - 117 U/L   Total Bilirubin 0.9  0.3 - 1.2 mg/dL   GFR calc non Af Amer 49 (*) >90 mL/min   GFR calc Af Amer 57 (*) >90 mL/min   Comment:            The eGFR has been calculated     using the CKD EPI equation.     This calculation has not been     validated in all clinical     situations.     eGFR's persistently     <90 mL/min signify     possible Chronic Kidney Disease.  AMMONIA     Status: None   Collection Time    02/05/13  6:58 PM      Result Value Range   Ammonia 37  11 - 60 umol/L  ETHANOL     Status: None   Collection Time    02/05/13  6:58 PM      Result Value Range   Alcohol, Ethyl (B) <11  0 - 11 mg/dL   Comment:            LOWEST DETECTABLE LIMIT FOR     SERUM ALCOHOL IS 11 mg/dL     FOR MEDICAL PURPOSES ONLY  POCT I-STAT TROPONIN I     Status: None   Collection Time    02/05/13  7:26 PM      Result Value Range   Troponin i, poc 0.01  0.00 - 0.08 ng/mL   Comment 3            Comment: Due to the release kinetics of cTnI,     a negative  result within the first hours     of the onset of symptoms does not rule out     myocardial infarction with certainty.     If myocardial infarction is still suspected,     repeat the test at appropriate intervals.  CG4 I-STAT (LACTIC ACID)     Status: Abnormal   Collection Time    02/05/13  7:31 PM      Result Value Range   Lactic Acid, Venous 2.82 (*) 0.5 - 2.2 mmol/L  OSMOLALITY     Status: Abnormal   Collection Time    02/05/13  9:41 PM      Result Value Range   Osmolality 263 (*) 275 - 300 mOsm/kg  TSH     Status: None   Collection Time    02/05/13 10:54 PM      Result Value Range   TSH 0.455  0.350 - 4.500 uIU/mL  BASIC METABOLIC PANEL     Status: Abnormal   Collection Time    02/05/13 10:54 PM      Result Value Range   Sodium 121 (*) 135 - 145 mEq/L   Potassium 3.7  3.5 - 5.1 mEq/L   Chloride 83 (*) 96 - 112 mEq/L   CO2 20  19 - 32 mEq/L    Glucose, Bld 92  70 - 99 mg/dL   BUN 19  6 - 23 mg/dL   Creatinine, Ser 1.61 (*) 0.50 - 1.35 mg/dL   Calcium 9.2  8.4 - 09.6 mg/dL   GFR calc non Af Amer 48 (*) >90 mL/min   GFR calc Af Amer 55 (*) >90 mL/min   Comment:            The eGFR has been calculated     using the CKD EPI equation.     This calculation has not been     validated in all clinical     situations.     eGFR's persistently     <90 mL/min signify     possible Chronic Kidney Disease.  SALICYLATE LEVEL     Status: Abnormal   Collection Time    02/05/13 11:30 PM      Result Value Range   Salicylate Lvl <2.0 (*) 2.8 - 20.0 mg/dL  ACETAMINOPHEN LEVEL     Status: None   Collection Time    02/05/13 11:30 PM      Result Value Range   Acetaminophen (Tylenol), Serum <15.0  10 - 30 ug/mL   Comment:            THERAPEUTIC CONCENTRATIONS VARY     SIGNIFICANTLY. A RANGE OF 10-30     ug/mL MAY BE AN EFFECTIVE     CONCENTRATION FOR MANY PATIENTS.     HOWEVER, SOME ARE BEST TREATED     AT CONCENTRATIONS OUTSIDE THIS     RANGE.     ACETAMINOPHEN CONCENTRATIONS     >150 ug/mL AT 4 HOURS AFTER     INGESTION AND >50 ug/mL AT 12     HOURS AFTER INGESTION ARE     OFTEN ASSOCIATED WITH TOXIC     REACTIONS.  ALCOHOL, METHYL (METHANOL), BLOOD     Status: None   Collection Time    02/06/13 12:02 AM      Result Value Range   Methanol Lvl NEGATIVE     Comment: Performed at [facility]  ETHYLENE GLYCOL     Status: None   Collection Time    02/06/13  12:02 AM      Result Value Range   Ethylene Glycol Lvl <5  <5 mg/dL   Comment: Performed at [facility]  GLUCOSE, CAPILLARY     Status: None   Collection Time    02/06/13 12:48 AM      Result Value Range   Glucose-Capillary 90  70 - 99 mg/dL  BLOOD GAS, ARTERIAL     Status: Abnormal   Collection Time    02/06/13  1:30 AM      Result Value Range   FIO2 0.60     Delivery systems VENTILATOR     Mode PRESSURE REGULATED VOLUME CONTROL     VT 500      Rate 14     Peep/cpap 5.0     pH, Arterial 6.972 (*) 7.350 - 7.450   Comment: CRITICAL RESULT CALLED TO, READ BACK BY AND VERIFIED WITH:     CYRIL FORCHA,RN AT 0147 CALLED BY JESSICA MARSHBURN RRT, RCP, ON 02/06/2013   pCO2 arterial 99.7 (*) 35.0 - 45.0 mmHg   Comment: CRITICAL RESULT CALLED TO, READ BACK BY AND VERIFIED WITH:     Marigene Ehlers, RN AT 0147 CALLED BY JESSICA MARSHBURN RRT, RCP ON 02/06/2013   pO2, Arterial 134.0 (*) 80.0 - 100.0 mmHg   Bicarbonate 21.9  20.0 - 24.0 mEq/L   TCO2 24.9  0 - 100 mmol/L   Acid-base deficit 8.6 (*) 0.0 - 2.0 mmol/L   O2 Saturation 97.8     Patient temperature 98.6     Collection site RIGHT RADIAL     Drawn by [phone]     Sample type ARTERIAL DRAW     Allens test (pass/fail) PASS  PASS  CULTURE, RESPIRATORY (NON-EXPECTORATED)     Status: None   Collection Time    02/06/13  2:48 AM      Result Value Range   Specimen Description ENDOTRACHEAL     Special Requests NONE     Gram Stain       Value: FEW WBC PRESENT, PREDOMINANTLY PMN     RARE SQUAMOUS EPITHELIAL CELLS PRESENT     RARE GRAM POSITIVE COCCI     IN PAIRS   Culture PENDING     Report Status PENDING    MRSA PCR SCREENING     Status: None   Collection Time    02/06/13  3:33 AM      Result Value Range   MRSA by PCR NEGATIVE  NEGATIVE   Comment:            The GeneXpert MRSA Assay (FDA     approved for NASAL specimens     only), is one component of a     comprehensive MRSA colonization     surveillance program. It is not     intended to diagnose MRSA     infection nor to guide or     monitor treatment for     MRSA infections.  GLUCOSE, CAPILLARY     Status: Abnormal   Collection Time    02/06/13  4:04 AM      Result Value Range   Glucose-Capillary 165 (*) 70 - 99 mg/dL   Comment 1 Documented in Chart     Comment 2 Notify RN    POCT I-STAT 3, BLOOD GAS (G3+)     Status: Abnormal   Collection Time    02/06/13  4:08 AM      Result Value Range   pH, Arterial 7.302 (*)  7.350 - 7.450   pCO2 arterial 43.4  35.0 - 45.0 mmHg   pO2, Arterial 121.0 (*) 80.0 - 100.0 mmHg   Bicarbonate 21.6  20.0 - 24.0 mEq/L   TCO2 23  0 - 100 mmol/L   O2 Saturation 98.0     Acid-base deficit 5.0 (*) 0.0 - 2.0 mmol/L   Patient temperature 97.7 F     Collection site RADIAL, ALLEN'S TEST ACCEPTABLE     Drawn by Operator     Sample type ARTERIAL    CORTISOL     Status: None   Collection Time    02/06/13  5:00 AM      Result Value Range   Cortisol, Plasma 62.0     Comment: (NOTE)     AM:  4.3 - 22.4 ug/dL     PM:  3.1 - 96.0 ug/dL  CBC     Status: Abnormal   Collection Time    02/06/13  5:00 AM      Result Value Range   WBC 18.2 (*) 4.0 - 10.5 K/uL   RBC 4.03 (*) 4.22 - 5.81 MIL/uL   Hemoglobin 12.5 (*) 13.0 - 17.0 g/dL   HCT 45.4 (*) 09.8 - 11.9 %   MCV 84.1  78.0 - 100.0 fL   MCH 31.0  26.0 - 34.0 pg   MCHC 36.9 (*) 30.0 - 36.0 g/dL   RDW 14.7  82.9 - 56.2 %   Platelets 252  150 - 400 K/uL  BASIC METABOLIC PANEL     Status: Abnormal   Collection Time    02/06/13  5:00 AM      Result Value Range   Sodium 130 (*) 135 - 145 mEq/L   Comment: DELTA CHECK NOTED   Potassium 3.3 (*) 3.5 - 5.1 mEq/L   Chloride 91 (*) 96 - 112 mEq/L   CO2 23  19 - 32 mEq/L   Glucose, Bld 145 (*) 70 - 99 mg/dL   BUN 20  6 - 23 mg/dL   Creatinine, Ser 1.30 (*) 0.50 - 1.35 mg/dL   Calcium 7.7 (*) 8.4 - 10.5 mg/dL   GFR calc non Af Amer 47 (*) >90 mL/min   GFR calc Af Amer 55 (*) >90 mL/min   Comment:            The eGFR has been calculated     using the CKD EPI equation.     This calculation has not been     validated in all clinical     situations.     eGFR's persistently     <90 mL/min signify     possible Chronic Kidney Disease.  HIV ANTIBODY (ROUTINE TESTING)     Status: None   Collection Time    02/06/13  5:00 AM      Result Value Range   HIV NON REACTIVE  NON REACTIVE  PROCALCITONIN     Status: None   Collection Time    02/06/13  5:00 AM      Result Value Range    Procalcitonin 3.68     Comment:            Interpretation:     PCT > 2 ng/mL:     Systemic infection (sepsis) is likely,     unless other causes are known.     (NOTE)             ICU PCT Algorithm  Non ICU PCT Algorithm        ----------------------------     ------------------------------             PCT < 0.25 ng/mL                 PCT < 0.1 ng/mL         Stopping of antibiotics            Stopping of antibiotics           strongly encouraged.               strongly encouraged.        ----------------------------     ------------------------------           PCT level decrease by               PCT < 0.25 ng/mL           >= 80% from peak PCT           OR PCT 0.25 - 0.5 ng/mL          Stopping of antibiotics                                                 encouraged.         Stopping of antibiotics               encouraged.        ----------------------------     ------------------------------           PCT level decrease by              PCT >= 0.25 ng/mL           < 80% from peak PCT            AND PCT >= 0.5 ng/mL            Continuing antibiotics                                                  encouraged.           Continuing antibiotics                encouraged.        ----------------------------     ------------------------------         PCT level increase compared          PCT > 0.5 ng/mL             with peak PCT AND              PCT >= 0.5 ng/mL             Escalation of antibiotics                                              strongly encouraged.          Escalation of antibiotics            strongly encouraged.  GLUCOSE, CAPILLARY  Status: Abnormal   Collection Time    02/06/13  7:55 AM      Result Value Range   Glucose-Capillary 135 (*) 70 - 99 mg/dL  SALICYLATE LEVEL     Status: Abnormal   Collection Time    02/06/13  9:30 AM      Result Value Range   Salicylate Lvl <2.0 (*) 2.8 - 20.0 mg/dL  CK     Status: Abnormal   Collection Time     02/06/13  9:30 AM      Result Value Range   [place] [address] (*) 7 - 232 U/L  HEPATIC FUNCTION PANEL     Status: Abnormal   Collection Time    02/06/13  9:30 AM      Result Value Range   Total Protein 5.7 (*) 6.0 - 8.3 g/dL   Albumin 3.0 (*) 3.5 - 5.2 g/dL   AST 161 (*) 0 - 37 U/L   ALT 47  0 - 53 U/L   Alkaline Phosphatase 49  39 - 117 U/L   Total Bilirubin 0.4  0.3 - 1.2 mg/dL   Bilirubin, Direct 0.2  0.0 - 0.3 mg/dL   Indirect Bilirubin 0.2 (*) 0.3 - 0.9 mg/dL  PROTIME-INR     Status: None   Collection Time    02/06/13  9:30 AM      Result Value Range   Prothrombin Time 14.1  11.6 - 15.2 seconds   INR 1.10  0.00 - 1.49  CLOSTRIDIUM DIFFICILE BY PCR     Status: None   Collection Time    02/06/13 10:15 AM      Result Value Range   C difficile by pcr NEGATIVE  NEGATIVE  BASIC METABOLIC PANEL     Status: Abnormal   Collection Time    02/06/13 10:33 AM      Result Value Range   Sodium 130 (*) 135 - 145 mEq/L   Potassium 3.5  3.5 - 5.1 mEq/L   Chloride 95 (*) 96 - 112 mEq/L   CO2 24  19 - 32 mEq/L   Glucose, Bld 132 (*) 70 - 99 mg/dL   BUN 21  6 - 23 mg/dL   Creatinine, Ser 0.96 (*) 0.50 - 1.35 mg/dL   Calcium 7.3 (*) 8.4 - 10.5 mg/dL   GFR calc non Af Amer 42 (*) >90 mL/min   GFR calc Af Amer 49 (*) >90 mL/min   Comment:            The eGFR has been calculated     using the CKD EPI equation.     This calculation has not been     validated in all clinical     situations.     eGFR's persistently     <90 mL/min signify     possible Chronic Kidney Disease.  OSMOLALITY     Status: Abnormal   Collection Time    02/06/13 10:33 AM      Result Value Range   Osmolality 274 (*) 275 - 300 mOsm/kg  TROPONIN I     Status: Abnormal   Collection Time    02/06/13 10:34 AM      Result Value Range   Troponin I 0.87 (*) <0.30 ng/mL   Comment:            Due to the release kinetics of cTnI,     a negative result within the first hours     of the onset of symptoms  does not rule  out     myocardial infarction with certainty.     If myocardial infarction is still suspected,     repeat the test at appropriate intervals.     CRITICAL RESULT CALLED TO, READ BACK BY AND VERIFIED WITH:     AMEH S RN 02/06/13 1117 COSTELLO B  GLUCOSE, CAPILLARY     Status: Abnormal   Collection Time    02/06/13 11:56 AM      Result Value Range   Glucose-Capillary 131 (*) 70 - 99 mg/dL  GLUCOSE, CAPILLARY     Status: Abnormal   Collection Time    02/06/13  2:23 PM      Result Value Range   Glucose-Capillary 125 (*) 70 - 99 mg/dL  POCT I-STAT 3, BLOOD GAS (G3+)     Status: Abnormal   Collection Time    02/06/13  4:38 PM      Result Value Range   pH, Arterial 7.334 (*) 7.350 - 7.450   pCO2 arterial 37.6  35.0 - 45.0 mmHg   pO2, Arterial 77.0 (*) 80.0 - 100.0 mmHg   Bicarbonate 20.0  20.0 - 24.0 mEq/L   TCO2 21  0 - 100 mmol/L   O2 Saturation 94.0     Acid-base deficit 5.0 (*) 0.0 - 2.0 mmol/L   Collection site RADIAL, ALLEN'S TEST ACCEPTABLE     Sample type ARTERIAL      Recent Results (from the past 240 hour(s))  CULTURE, RESPIRATORY (NON-EXPECTORATED)     Status: None   Collection Time    02/06/13  2:48 AM      Result Value Range Status   Specimen Description ENDOTRACHEAL   Final   Special Requests NONE   Final   Gram Stain     Final   Value: FEW WBC PRESENT, PREDOMINANTLY PMN     RARE SQUAMOUS EPITHELIAL CELLS PRESENT     RARE GRAM POSITIVE COCCI     IN PAIRS   Culture PENDING   Incomplete   Report Status PENDING   Incomplete  MRSA PCR SCREENING     Status: None   Collection Time    02/06/13  3:33 AM      Result Value Range Status   MRSA by PCR NEGATIVE  NEGATIVE Final   Comment:            The GeneXpert MRSA Assay (FDA     approved for NASAL specimens     only), is one component of a     comprehensive MRSA colonization     surveillance program. It is not     intended to diagnose MRSA     infection nor to guide or     monitor treatment for     MRSA  infections.  CLOSTRIDIUM DIFFICILE BY PCR     Status: None   Collection Time    02/06/13 10:15 AM      Result Value Range Status   C difficile by pcr NEGATIVE  NEGATIVE Final    Ct Abdomen Pelvis Wo Contrast  02/06/2013  *RADIOLOGY REPORT*  Clinical Data: Altered mental status.  Hyponatremia. The patient was found down.  CT ABDOMEN AND PELVIS WITHOUT CONTRAST  Technique:  Multidetector CT imaging of the abdomen and pelvis was performed following the standard protocol without intravenous contrast.  Comparison: Radiographs dated 12/09/2011 and CT scan of the abdomen dated to 08/2012  Findings: The liver, spleen, pancreas, adrenal glands, and kidneys are normal.  Ostomy is  noted in the left mid abdomen.  Air scattered throughout nondistended loops of large and small bowel. No free air or free fluid.  No acute osseous abnormality. No dilated bile ducts.  The lungs are hyperinflated consistent with emphysema.  IMPRESSION: No acute abnormalities.   Original Report Authenticated By: Francene Boyers, M.D.    Dg Chest 2 View  02/05/2013  *RADIOLOGY REPORT*  Clinical Data: Altered mental status, combative patient  CHEST - 2 VIEW  Comparison: Portable chest x-ray of 12/14/2011  Findings: The lungs are hyperaerated consistent with emphysema. Minimal haziness is present at the left lung base which may simply represent atelectasis.  If symptoms persist or worsen a follow-up chest x-ray may be warranted to exclude pneumonia.  Mediastinal contours appear stable.  The heart is within normal limits in size. No acute bony abnormality is seen.  IMPRESSION: Emphysema.  No definite active process.  Probable mild atelectasis at the left lung base.   Original Report Authenticated By: Dwyane Dee, M.D.    Ct Head Wo Contrast  02/05/2013  *RADIOLOGY REPORT*  Clinical Data:  Altered mental status.  Patient found down.  CT HEAD WITHOUT CONTRAST CT CERVICAL SPINE WITHOUT CONTRAST  Technique:  Multidetector CT imaging of the head and  cervical spine was performed following the standard protocol without intravenous contrast.  Multiplanar CT image reconstructions of the cervical spine were also generated.  Comparison:  Head and cervical spine CT scan 04/09/2011.  CT HEAD  Findings: No evidence of acute intracranial abnormality including infarct, hemorrhage, mass lesion, mass effect, midline shift or abnormal extra-axial fluid collection is identified.  There is no hydrocephalus or pneumocephalus.  Tiny amount of fluid in the left mastoid air cells is noted.  The calvarium is intact.  IMPRESSION: No acute abnormality.  CT CERVICAL SPINE  Findings: There is no fracture or subluxation of the cervical spine.  Intervertebral disc space height is maintained.  Lung apices demonstrate extensive emphysematous disease and biapical bulla.  IMPRESSION:  1.  No acute abnormality.  2.  Extensive emphysema with biapical bulla.   Original Report Authenticated By: Holley Dexter, M.D.    Ct Cervical Spine Wo Contrast  02/05/2013  *RADIOLOGY REPORT*  Clinical Data:  Altered mental status.  Patient found down.  CT HEAD WITHOUT CONTRAST CT CERVICAL SPINE WITHOUT CONTRAST  Technique:  Multidetector CT imaging of the head and cervical spine was performed following the standard protocol without intravenous contrast.  Multiplanar CT image reconstructions of the cervical spine were also generated.  Comparison:  Head and cervical spine CT scan 04/09/2011.  CT HEAD  Findings: No evidence of acute intracranial abnormality including infarct, hemorrhage, mass lesion, mass effect, midline shift or abnormal extra-axial fluid collection is identified.  There is no hydrocephalus or pneumocephalus.  Tiny amount of fluid in the left mastoid air cells is noted.  The calvarium is intact.  IMPRESSION: No acute abnormality.  CT CERVICAL SPINE  Findings: There is no fracture or subluxation of the cervical spine.  Intervertebral disc space height is maintained.  Lung apices  demonstrate extensive emphysematous disease and biapical bulla.  IMPRESSION:  1.  No acute abnormality.  2.  Extensive emphysema with biapical bulla.   Original Report Authenticated By: Holley Dexter, M.D.    Dg Chest Port 1 View  02/06/2013  *RADIOLOGY REPORT*  Clinical Data: Endotracheal tube and central line placement. Altered mental status.  PORTABLE CHEST - 1 VIEW  Comparison: 02/05/2013 at 6:50 p.m.  Findings: Endotracheal tube is in good  position.  Left jugular vein catheter tip is in the superior vena cava.  No pneumothorax.  Pulmonary vascularity and heart size are normal.  Lungs are hyperinflated but clear.  No osseous abnormality.  IMPRESSION: Endotracheal tube and central line appear in good position. Emphysema.   Original Report Authenticated By: Francene Boyers, M.D.    I have seen and evaluated the patient. I have reviewed the above note and made appropriate changes.   Assessment/Plan:  57yo male with recurrent episodes of stiffening in the setting of AMS and then cardiac arrest and intubation. Whether these episodes represent seizure is unclear to me. If they do, would treat with benzodiazepine given the history of EtOH, but other signs of withdrawal absent at the present time(hr 80s and normothermic).   Given the encephalopathy prior to his arrest and concern for beer potomania, possible malnutrition I would favor high dose thiamine replacement for three days as it is a relatively low risk intervention.   Hypoxic encephalopathy is a possibility, but no clear evidence for this at this time.   1) Thiamine 500mg  IV TID x 3 days 2) Continuous EEG, treat seizures if that is what these episodes represent. Will D/C EEG tomorrow morning.  3) MRI brain tomorrow.  4) Check cortisol    Ritta Slot, MD [facility] [phone]  If 7pm- 7am, please page neurology on call at [phone].

## 2013-02-06 NOTE — Progress Notes (Signed)
RT attempted to stick Aline several times. Unable to thread catheter.

## 2013-02-06 NOTE — Progress Notes (Signed)
[facility] PCCM  I was called to the bedside emergently because Zachary Walls had a cardiac arrest.  Presumably, he had a respiratory arrest as after intubation he had very little air movement and has been difficult to ventilate.  Filed Vitals:   02/05/13 2130 02/05/13 2200 02/05/13 2230 02/05/13 2300  BP: 146/75 140/79 169/85 170/84  Pulse: 93 96 100 99  Temp:      TempSrc:      Resp: 19 24 22 26   Height:    5' 6.93" (1.7 m)  Weight:    52.2 kg (115 lb 1.3 oz)  SpO2: 99% 98% 98% 98%   Physical Exam  Gen: sedated on vent HEENT: NCAT, PERRL, ETT in place PULM: very little air movement, some wheezing CV: tachy, no mgr, no JVD AB: BS+, soft, nontender, no hsm Ext: warm, no edema, no clubbing, no cyanosis Derm: no rash or skin breakdown Neuro:sedated on vent  Impression: 1) Shock > likely due to autopeep and acidosis 2) Acute respiratory failure due to AE COPD, very severe emphysema; severe respiratory and metabolic acidosis 3) Acute encephalopathy 4) Hyponatremia  Plan: 1) PRVC, > increase rate to 22, but watch carefully for autopeep 2) Periodically allow him to exhale off vent if autopeep 3) levophed 4) Aline 5) Albuterol repeatedly now x3 doses 5mg  each, may need continuous nebs 6) Repeat ABG 0300  Family updated at bedside  CC time 45 minutes.  Yolonda Kida PCCM Pager: [phone] Cell: [phone] If no response, call [phone]

## 2013-02-06 NOTE — ED Provider Notes (Addendum)
Medical screening examination/treatment/procedure(s) were conducted as a shared visit with non-physician practitioner(s) and myself.  I personally evaluated the patient during the encounter 57 yo man found on floor by his son.  Hx of alcoholism.  Exam showed him to be a middleaged man, unshaven, somewhat emaciated.  Eyes open, but does not respond to questions.  Lungs clear, heart sounds normal.  LLQ colostomy.  Sensation and motor intact.  Lab workup showed elevated WBC of 25,500, but Chest x-ray and UA showed no source.  Na low at 120.  Urine looked cloudy, but UA showed only crystals.  UDS positive for THC.   Needs admission for altered mental status, hyponatremia.  Carleene Cooper III, MD 02/06/13 2130    Carleene Cooper III, MD 02/06/13 [phone]

## 2013-02-06 NOTE — Progress Notes (Signed)
eLink Physician-Brief Progress Note Patient Name: Zachary Walls DOB: 1956-05-24 MRN: [id number]  Date of Service  02/06/2013   HPI/Events of Note   neurologist calling electronic ICU and asking if record and can be given for EEG because patient is biting on ET tube. Discussed with bedside nurse. Patient RASS sedation score is -4 on low-dose fentanyl and Versed infusions   eICU Interventions   order to give 10 mg by vecurnonium x1 at the time of EEG given and discussed with neurologist    Intervention Category Major Interventions: Change in mental status - evaluation and management  Jakaila Norment 02/06/2013, 3:50 PM

## 2013-02-06 NOTE — Progress Notes (Addendum)
INITIAL NUTRITION ASSESSMENT  DOCUMENTATION CODES Per approved criteria  -Severe  malnutrition in the context of social or environmental circumstances -Underweight   INTERVENTION: 1. Monitor magnesium, potassium, and phosphorus daily for at least 3 days, MD to replete as needed, as pt is at risk for refeeding syndrome given dx of severe malnutrition. Discussed with Dr. Tyson Alias. 2. Initiate Osmolite 1.2 at 15 ml/hr. Add liquid MVI. Add 30 ml Prostat via tube BID. This initial regimen will provide: 632 kcal, 50 grams protein, 295 ml free water. 3. When ready to advance, recommend advancement of Osmolite 1.2 by 10 ml/hr every 12 hours, to goal of 55 ml/hr. (Continue 30 ml Prostat via tube BID.) Goal regimen will provide: 1784 kcal, 104 grams protein, 1082 ml free water.  4. RD to continue to follow nutrition care plan  NUTRITION DIAGNOSIS: Inadequate oral intake related to inability to eat as evidenced by NPO status.   Goal: Intake to meet >90% of estimated nutrition needs.  Monitor:  Vent settings, weights, labs, I/O's, enteral nutrition tolerance  Reason for Assessment: MD Consult for TF Initiation/Management; Low Braden Score  57 y.o. male  Admitting Dx: Altered mental status  ASSESSMENT: Admitted with AMS. Per chart, pt had suspicious visitors that may have given pt drugs. 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  Pt meets criteria for severe MALNUTRITION in the context of social/environmental circumstances as evidenced by intake of <50% of estimated energy requirement x at least 1 month and severe muscle wasting. Pt appears cachectic - unable to perform physical assessment at this time. Family at bedside confirm that pt does not eat.  Work-up reveals severe emphysema with bullae and ARF. 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 MD suspecting tea and toast syndrome.  Underwent emergent Hartmann's procedure last year 2/2 SBO with  colostomy 2/2 poor-nutrition.  Intubated 4/18 s/p Code Blue. Patient is currently intubated on ventilator support.  MV: 11.1 Temp:Temp (24hrs), Avg:99.1 F (37.3 C), Min:97.7 F (36.5 C), Max:100 F (37.8 C)  Discussed concern of refeeding syndrome with Dr. Tyson Alias. He is also concerned and wants to hold TF at lwo rate at this time - agree with MD. Recommend close monitoring of refeeding labs.  Height: Ht Readings from Last 1 Encounters:  02/05/13 5' 6.93" (1.7 m)    Weight: Wt Readings from Last 1 Encounters:  02/06/13 117 lb 1 oz (53.1 kg)    Ideal Body Weight: 148 lb  % Ideal Body Weight: 79%  Wt Readings from Last 10 Encounters:  02/06/13 117 lb 1 oz (53.1 kg)  01/15/13 115 lb (52.164 kg)  08/05/12 113 lb 12.8 oz (51.619 kg)  01/28/12 113 lb 6.4 oz (51.438 kg)  12/14/11 122 lb 5.7 oz (55.5 kg)  12/14/11 122 lb 5.7 oz (55.5 kg)    Usual Body Weight: 115 lb  % Usual Body Weight: 102%  BMI:  Body mass index is 18.37 kg/(m^2). Underweight.  Estimated Nutritional Needs: Kcal: 1710 Protein: 80 - 100 grams Fluid: 1.5 - 1.7 liters  Skin: incomplete at this time  Diet Order: NPO  EDUCATION NEEDS: -No education needs identified at this time   Intake/Output Summary (Last 24 hours) at 02/06/13 1053 Last data filed at 02/06/13 1000  Gross per 24 hour  Intake 2741.91 ml  Output   1250 ml  Net 1491.91 ml    Last BM: PTA; ostomy  Labs:   Recent Labs Lab 02/05/13 1858 02/05/13 2254 02/06/13  0500  NA 120* 121* 130*  K 3.8 3.7 3.3*  CL 80* 83* 91*  CO2 19 20 23   BUN 19 19 20   CREATININE 1.53* 1.57* 1.58*  CALCIUM 9.7 9.2 7.7*  GLUCOSE 88 92 145*    CBG (last 3)   Recent Labs  02/06/13 0048 02/06/13 0404 02/06/13 0755  GLUCAP 90 165* 135*    Scheduled Meds: . albuterol  2.5 mg Nebulization Q4H  . dextrose  1 ampule Intravenous Once  . folic acid  1 mg Oral Daily  . heparin  5,000 Units Subcutaneous Q8H  . ipratropium  0.5 mg  Nebulization Q4H  . methylPREDNISolone (SOLU-MEDROL) injection  60 mg Intravenous Q12H  . pantoprazole (PROTONIX) IV  40 mg Intravenous QHS  . piperacillin-tazobactam (ZOSYN)  IV  3.375 g Intravenous Q8H  . potassium chloride      . thiamine  100 mg Intravenous Daily    Continuous Infusions: . feeding supplement (OSMOLITE 1.2 CAL)    . fentaNYL infusion INTRAVENOUS 100 mcg/hr (02/06/13 0144)  . midazolam (VERSED) infusion 2 mg/hr (02/06/13 0145)  . norepinephrine (LEVOPHED) Adult infusion 2 mcg/min (02/06/13 0146)    Past Medical History  Diagnosis Date  . COPD (chronic obstructive pulmonary disease)   . Pneumonia   . Hypertension   . HOH (hard of hearing) 12/01/2011    Past Surgical History  Procedure Laterality Date  . Hernia repair      inguinal  . 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 Colostomy revision  12/11/2011    Procedure: COLON RESECTION SIGMOID;  Surgeon: Emelia Loron, MD;  Location: [facility];  Service: General;;  . Bowel resection  12/11/2011    Procedure: SMALL BOWEL RESECTION;  Surgeon: Emelia Loron, MD;  Location: [facility];  Service: General;;  times 2  . Cystoscopy w/ ureteral stent placement  12/11/2011    Procedure: CYSTOSCOPY WITH STENT REPLACEMENT;  Surgeon: Anner Crete, MD;  Location: [facility];  Service: Urology;  Laterality: Left;  ureteral catheter placement    Jarold Motto MS, RD, LDN Pager: [phone] After-hours pager: [phone]

## 2013-02-06 NOTE — Progress Notes (Signed)
Patient arrived on the floor not breathing and without a pulse and blue. A code was activated while patient was still on stretcher.transporter said patient brady down while in the elevators and lost pulse. Code lasted for about 7 mins. And we got a pulse. Dr Atlee Abide was present and inserted a CVC and intubated patient. Prior to procedure, we had episode when patients b/p dropped to the 70s. MD order pressors to be started. Dopamine was started and later transition to levophed.  At present, patient appear to be stable. Will continue to monitor.

## 2013-02-07 LAB — COMPREHENSIVE METABOLIC PANEL
Albumin: 2.9 g/dL — ABNORMAL LOW (ref 3.5–5.2)
Alkaline Phosphatase: 44 U/L (ref 39–117)
BUN: 23 mg/dL (ref 6–23)
Chloride: 99 mEq/L (ref 96–112)
Creatinine, Ser: 1.64 mg/dL — ABNORMAL HIGH (ref 0.50–1.35)
GFR calc Af Amer: 52 mL/min — ABNORMAL LOW (ref >90)
GFR calc non Af Amer: 45 mL/min — ABNORMAL LOW (ref >90)
Glucose, Bld: 154 mg/dL — ABNORMAL HIGH (ref 70–99)
Total Bilirubin: 0.3 mg/dL (ref 0.3–1.2)

## 2013-02-07 LAB — CBC
MCV: 84.3 fL (ref 78.0–100.0)
Platelets: 224 10*3/uL (ref 150–400)
RBC: 3.32 MIL/uL — ABNORMAL LOW (ref 4.22–5.81)
RDW: 14 % (ref 11.5–15.5)
WBC: 14 10*3/uL — ABNORMAL HIGH (ref 4.0–10.5)

## 2013-02-07 LAB — URINE CULTURE
Culture: NO GROWTH
Special Requests: NORMAL

## 2013-02-07 LAB — POCT I-STAT 3, ART BLOOD GAS (G3+)
Acid-base deficit: 2 mmol/L (ref 0.0–2.0)
Patient temperature: 98.1
pH, Arterial: 7.417 (ref 7.350–7.450)

## 2013-02-07 LAB — GLUCOSE, CAPILLARY
Glucose-Capillary: 160 mg/dL — ABNORMAL HIGH (ref 70–99)
Glucose-Capillary: 177 mg/dL — ABNORMAL HIGH (ref 70–99)
Glucose-Capillary: 170 mg/dL — ABNORMAL HIGH (ref 70–99)

## 2013-02-07 LAB — CORTISOL-AM, BLOOD: Cortisol - AM: 19 ug/dL (ref 4.3–22.4)

## 2013-02-07 MED ORDER — ACETAMINOPHEN 160 MG/5ML PO SOLN
650.0000 mg | ORAL | Status: DC | PRN
  Administered 2013-02-07: 650 mg via ORAL
  Filled 2013-02-07: qty 20.3

## 2013-02-07 MED ORDER — SODIUM CHLORIDE 0.9 % IV SOLN
500.0000 mg | Freq: Two times a day (BID) | INTRAVENOUS | Status: AC
  Administered 2013-02-07 – 2013-02-12 (×11): 500 mg via INTRAVENOUS
  Filled 2013-02-07 (×11): qty 5

## 2013-02-07 MED ORDER — WHITE PETROLATUM GEL
Status: AC
  Administered 2013-02-07: 0.2
  Filled 2013-02-07: qty 5

## 2013-02-07 NOTE — Progress Notes (Signed)
PULMONARY  / CRITICAL CARE MEDICINE  Name: Zachary Walls MRN: [id number] DOB: [birth year]    ADMISSION DATE:  02/05/2013 CONSULTATION DATE:  40981  REFERRING MD :  Dr. Ignacia Palma PRIMARY SERVICE: PCCM  CHIEF COMPLAINT:  AMS  BRIEF PATIENT DESCRIPTION: Pt was last seen normal 4/16 at 4pm. Son found pt down in fetal position complaining of breathing. Pt had suspicious visitor that may have given pt drugs. 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 Pt continues to be altered and PCCM asked to manage.   SIGNIFICANT EVENTS / STUDIES:  4/17- AMS, admitted to ICU 4/18 - cardiac arrest with 7 minutes of CPR, emergent intubation, on pressor 4/18- eeg -slow, no focus 4/18 echo - 65%, WNL, euvolemic  LINES / TUBES: 4/18 ETT>>> 4/18 LIJ>> 4/18 Foley>>  CULTURES: 4/17 BCx 4/17 resp Cx 4/17 UCx 4/18 cdiff>>>  ANTIBIOTICS: 4/17 IV zosyn>>> 4/17 Vanc >>>  SUBJECTIVE:  More alert, less wheezing  VITAL SIGNS: Temp:  [98.1 F (36.7 C)-101 F (38.3 C)] 101 F (38.3 C) (04/19 0415) Pulse Rate:  [65-120] 69 (04/19 0600) Resp:  [18-24] 22 (04/19 0600) BP: (80-146)/(54-84) 115/62 mmHg (04/19 0600) SpO2:  [89 %-100 %] 96 % (04/19 0600) FiO2 (%):  [40 %-60 %] 40 % (04/19 0400) HEMODYNAMICS: CVP:  [4 mmHg-62 mmHg] 10 mmHg VENTILATOR SETTINGS: Vent Mode:  [-] PRVC FiO2 (%):  [40 %-60 %] 40 % Set Rate:  [22 bmp] 22 bmp Vt Set:  [500 mL] 500 mL PEEP:  [5 cmH20-10 cmH20] 5 cmH20 Plateau Pressure:  [16 cmH20-26 cmH20] 16 cmH20 INTAKE / OUTPUT: Intake/Output     04/18 0701 - 04/19 0700 04/19 0701 - 04/20 0700   I.V. (mL/kg) 2439.5 (45.9)    NG/GT 275    IV Piggyback 400    Total Intake(mL/kg) 3114.5 (58.7)    Urine (mL/kg/hr) 2825 (2.2)    Total Output 2825     Net +289.5            PHYSICAL EXAMINATION: General:  Opens eyes , tracks eyes, perrl, not following commands Neuro:  See above HEENT:  PERRL, no jvd Cardiovascular:  RRR, no m/r/g/S3/S4 Lungs:   Mechanical breath sounds, moving air better, less wheezing Abdomen:  Colostomy with brown stool drainage, no r/g Musculoskeletal:  Deconditioned  Skin:  Multiple small non-draining abrasions  LABS:  Recent Labs Lab 02/05/13 1858 02/05/13 1931  02/06/13 0408 02/06/13 0500 02/06/13 0930 02/06/13 1033 02/06/13 1034 02/06/13 1638 02/06/13 1652 02/07/13 0442 02/07/13 0500  HGB 14.3  --   --   --  12.5*  --   --   --   --   --   --  10.4*  WBC 25.5*  --   --   --  18.2*  --   --   --   --   --   --  14.0*  PLT 279  --   --   --  252  --   --   --   --   --   --  224  NA 120*  --   < >  --  130*  --  130*  --   --  130*  --  132*  K 3.8  --   < >  --  3.3*  --  3.5  --   --  3.9  --  4.1  CL 80*  --   < >  --  91*  --  95*  --   --  98  --  99  CO2 19  --   < >  --  23  --  24  --   --  22  --  24  GLUCOSE 88  --   < >  --  145*  --  132*  --   --  153*  --  154*  BUN 19  --   < >  --  20  --  21  --   --  21  --  23  CREATININE 1.53*  --   < >  --  1.58*  --  1.73*  --   --  1.69*  --  1.64*  CALCIUM 9.7  --   < >  --  7.7*  --  7.3*  --   --  7.4*  --  7.7*  AST 252*  --   --   --   --  249*  --   --   --   --   --  282*  ALT 50  --   --   --   --  47  --   --   --   --   --  52  ALKPHOS 77  --   --   --   --  49  --   --   --   --   --  44  BILITOT 0.9  --   --   --   --  0.4  --   --   --   --   --  0.3  PROT 7.9  --   --   --   --  5.7*  --   --   --   --   --  5.7*  ALBUMIN 4.5  --   --   --   --  3.0*  --   --   --   --   --  2.9*  INR  --   --   --   --   --  1.10  --   --   --   --   --   --   LATICACIDVEN  --  2.82*  --   --   --   --   --   --   --   --   --   --   TROPONINI  --   --   --   --   --   --   --  0.87*  --   --   --   --   PROCALCITON  --   --   --   --  3.68  --   --   --   --   --   --   --   PHART  --   --   < > 7.302*  --   --   --   --  7.334*  --  7.417  --   PCO2ART  --   --   < > 43.4  --   --   --   --  37.6  --  34.1*  --   PO2ART  --   --   < >  121.0*  --   --   --   --  77.0*  --  73.0*  --   < > = values in this interval not displayed.  Recent Labs Lab 02/06/13  1610 02/06/13 1156 02/06/13 1423 02/06/13 1559 02/06/13 2141  GLUCAP 135* 131* 125* 160* 177*    4/17 CXR: severe emphysematous changes, no infiltrates 4/18 CXR: big lungs, ETT and Left IJ in place  ASSESSMENT / PLAN:  PULMONARY A: Severe emphysema with bullae; COPD      VDRF- emergent intubation following cardiac arrest P:   -duonebs q2 to q4h -IV solumedrol 60mg  BID, remain, will consider reduction in am  -ABG reviewed, reduce to 14 SBT planned today, no extubation planned  CARDIOVASCULAR A:  Hx of HTN     Cardiac arrest with 7 min of CPR on 4/18 Shock, post arrest - improved P:  -levophed to off as goal -echo reviewed -maintain euvolemia -trop sec to cpr  RENAL A:  ARF - FeNa 0.2%, likely prerenal- Creatinine trending up     Metabolic acidosis and metabolic alkalosis: resolving, multifactorial   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 Improving with slow correction P:   -tsh wnl -chem in am  Cont nacl, reduce  GASTROINTESTINAL A:  emergent Hartmann's procedure 3/13 2/2 SBO with colostomy non-revised by surgery 2/2 poor nutrition and overall medical status - CT abdomen with no intraabdominal process   Elevated Transaminases -AST elevate = etoh? P:   -ppi continue TF   HEMATOLOGIC A:  Anemia, normocytic. No signs of bleeding, Hg stable, anemia of chronic disease? And dilution now      Leucocytosis - trending down, no signs of infection, plts stable P:  -sub q h -kvo  INFECTIOUS A:  R/o aspiration P:   - Vanc, Zosyn, maintain , follow cultures - f/u HIV - neg - C. Diff stool PCR - pending  ENDOCRINE A:  No active issues P:   -SSI -TF  NEUROLOGIC A:  AMS in setting hyponatremia, chronic EtOH, THC+. CT  head was negative. NH4 nrml      S/p arrest anoxia More alert 4/29 P:   -appreciate neurology ahelp, mri consideration -fent, limit sedation -Ativan PRN for signs of agitation/withdrawal -thiamine and folic acid daily, IV high dose, agree -eeg continuous, neg focus  TODAY'S SUMMARY:  EEG reviewed, encouraging alertness, wean, reduce MV , maintain abx  I have personally obtained a history, examined the patient, evaluated laboratory and imaging results, formulated the assessment and plan and placed orders.  CRITICAL CARE: The patient is critically ill with multiple organ systems failure and requires high complexity decision making for assessment and support, frequent evaluation and titration of therapies, application of advanced monitoring technologies and extensive interpretation of multiple databases. Critical Care Time devoted to patient care services described in this note is 30 minutes.   Mcarthur Rossetti. Tyson Alias, MD, FACP Pgr: [phone] [facility]

## 2013-02-07 NOTE — Progress Notes (Signed)
LTVM completed 

## 2013-02-07 NOTE — Progress Notes (Signed)
EEG completed.

## 2013-02-07 NOTE — Procedures (Signed)
This study recorded form 02/06/13 04:21 pm until 02/07/13  History: 57 yo M post cardiac arrest   Sedation:   Background: The background consists of generalized irregular delta activity. At times, sleep structures are seen including vertex waves and spindles, but these are usually not sustained for long. The background remains mostly delta activity even during waking periods. There was an event at 9:18 pm consisting of bilateral arm shaking, however patient continues to blink normally. The EEG during this period is almost completely obscured by muscle artifact. There is a rhythmic pattern at O2, but this could be artifact from his movement as well.   Photic stimulation: Physiologic driving is absent  EEG Abnormalities: 1) Single episode clinically concerning for seizure on video, that is mostly obscured by muscle artifact on EEG. There is some rhythmic activity concerning for possible ictal discharge.  2) generalized slow activity.   Clinical Interpretation: This abnormal EEG recorded a single episode concerning for seizure, but due to artifact, not definitively proven to be. There was no sign of subclinical seizures on this exam.   Given that he has had three similar episodes, the chance of seizure increases.   Ritta Slot, MD [facility] [phone]  If 7pm- 7am, please page neurology on call at [phone].

## 2013-02-07 NOTE — Progress Notes (Signed)
Subjective: More awake than yesterday  Exam: Filed Vitals:   02/07/13 0800  BP: 113/60  Pulse: 80  Temp:   Resp: 13   Gen: In bed, NAD MS: Awakens easily to voice. Does not follow commands AO:ZHYQM, fixates and tracks across midline in both directinos,  Motor: has voluntary movements of all four extremities.  Sensory:responds to nox stim x    Impression: 57 yo M presented with AMS in the setting of possible respiratory insufficiency and hyponatremia, also ? EtOH abuse/withdrawal. He had a respiratory -> cardiac arrest and was intubated. Currently, his exam has improved from yesterday significantly. His EEG did not show status epilepticus, but full read is pending. He had one episode of stiffening, still not certain that this represents seizure.   Recommendations: 1) will follow up EEG read later today.  2) MRI brain once patient is stable.  3) am cortisol pending.   Ritta Slot, MD [facility] [phone]  If 7pm- 7am, please page neurology on call at [phone].

## 2013-02-08 ENCOUNTER — Inpatient Hospital Stay (HOSPITAL_COMMUNITY): Payer: Medicaid Other

## 2013-02-08 LAB — COMPREHENSIVE METABOLIC PANEL
ALT: 73 U/L — ABNORMAL HIGH (ref 0–53)
AST: 354 U/L — ABNORMAL HIGH (ref 0–37)
Albumin: 2.8 g/dL — ABNORMAL LOW (ref 3.5–5.2)
Alkaline Phosphatase: 46 U/L (ref 39–117)
Calcium: 8.3 mg/dL — ABNORMAL LOW (ref 8.4–10.5)
Glucose, Bld: 132 mg/dL — ABNORMAL HIGH (ref 70–99)
Potassium: 4.6 mEq/L (ref 3.5–5.1)
Sodium: 137 mEq/L (ref 135–145)
Total Protein: 5.8 g/dL — ABNORMAL LOW (ref 6.0–8.3)

## 2013-02-08 LAB — CBC WITH DIFFERENTIAL/PLATELET
Eosinophils Absolute: 0 10*3/uL (ref 0.0–0.7)
HCT: 29.2 % — ABNORMAL LOW (ref 39.0–52.0)
Hemoglobin: 10.1 g/dL — ABNORMAL LOW (ref 13.0–17.0)
MCH: 30.5 pg (ref 26.0–34.0)
MCHC: 34.6 g/dL (ref 30.0–36.0)
MCV: 88.2 fL (ref 78.0–100.0)
Monocytes Relative: 6 % (ref 3–12)
RDW: 14.5 % (ref 11.5–15.5)

## 2013-02-08 LAB — BLOOD GAS, ARTERIAL
Acid-Base Excess: 0.7 mmol/L (ref 0.0–2.0)
Drawn by: 352351
FIO2: 0.4 %
MECHVT: 500 mL
O2 Saturation: 98 %
RATE: 14 resp/min
pO2, Arterial: 78.7 mmHg — ABNORMAL LOW (ref 80.0–100.0)

## 2013-02-08 LAB — CULTURE, RESPIRATORY W GRAM STAIN

## 2013-02-08 LAB — GLUCOSE, CAPILLARY
Glucose-Capillary: 114 mg/dL — ABNORMAL HIGH (ref 70–99)
Glucose-Capillary: 124 mg/dL — ABNORMAL HIGH (ref 70–99)
Glucose-Capillary: 129 mg/dL — ABNORMAL HIGH (ref 70–99)
Glucose-Capillary: 130 mg/dL — ABNORMAL HIGH (ref 70–99)

## 2013-02-08 MED ORDER — INSULIN ASPART 100 UNIT/ML ~~LOC~~ SOLN
0.0000 [IU] | SUBCUTANEOUS | Status: DC
  Administered 2013-02-08 – 2013-02-09 (×7): 1 [IU] via SUBCUTANEOUS
  Administered 2013-02-10: 2 [IU] via SUBCUTANEOUS
  Administered 2013-02-10: 1 [IU] via SUBCUTANEOUS
  Administered 2013-02-10: 2 [IU] via SUBCUTANEOUS
  Administered 2013-02-11: 1 [IU] via SUBCUTANEOUS
  Administered 2013-02-11: 2 [IU] via SUBCUTANEOUS
  Administered 2013-02-11 – 2013-02-14 (×7): 1 [IU] via SUBCUTANEOUS
  Administered 2013-02-14 – 2013-02-16 (×3): 2 [IU] via SUBCUTANEOUS
  Administered 2013-02-17 – 2013-02-22 (×8): 1 [IU] via SUBCUTANEOUS

## 2013-02-08 MED ORDER — SODIUM CHLORIDE 0.9 % IV SOLN
1.5000 g | Freq: Three times a day (TID) | INTRAVENOUS | Status: AC
  Administered 2013-02-08 – 2013-02-09 (×5): 1.5 g via INTRAVENOUS
  Filled 2013-02-08 (×5): qty 1.5

## 2013-02-08 MED ORDER — METHYLPREDNISOLONE SODIUM SUCC 40 MG IJ SOLR
40.0000 mg | Freq: Two times a day (BID) | INTRAMUSCULAR | Status: DC
  Administered 2013-02-08 – 2013-02-11 (×6): 40 mg via INTRAVENOUS
  Filled 2013-02-08 (×8): qty 1

## 2013-02-08 MED ORDER — LORAZEPAM 1 MG PO TABS
1.0000 mg | ORAL_TABLET | Freq: Four times a day (QID) | ORAL | Status: DC | PRN
  Administered 2013-02-12 – 2013-02-13 (×2): 1 mg via ORAL
  Filled 2013-02-08 (×2): qty 1

## 2013-02-08 MED ORDER — LORAZEPAM 2 MG/ML IJ SOLN
1.0000 mg | Freq: Four times a day (QID) | INTRAMUSCULAR | Status: DC | PRN
  Administered 2013-02-09 – 2013-02-12 (×4): 1 mg via INTRAVENOUS
  Filled 2013-02-08 (×5): qty 1

## 2013-02-08 NOTE — Progress Notes (Signed)
ANTIBIOTIC CONSULT NOTE - INITIAL  Pharmacy Consult for unasyn Indication: aspiration PNA  Allergies  Allergen Reactions  . Codeine Rash    Patient Measurements: Height: 5' 6.93" (170 cm) Weight: 123 lb 0.3 oz (55.8 kg) IBW/kg (Calculated) : 65.94   Vital Signs: Temp: 98.7 F (37.1 C) (04/20 0743) Temp src: Oral (04/20 0743) BP: 122/67 mmHg (04/20 0803) Pulse Rate: 90 (04/20 0803) Intake/Output from previous day: 04/19 0701 - 04/20 0700 In: 1591.7 [I.V.:796.7; NG/GT:345; IV Piggyback:450] Out: 1560 [Urine:1560] Intake/Output from this shift:    Labs:  Recent Labs  02/05/13 1830  02/06/13 0500  02/06/13 1652 02/07/13 0500 02/08/13 0500  WBC  --   < > 18.2*  --   --  14.0* 14.9*  HGB  --   < > 12.5*  --   --  10.4* 10.1*  PLT  --   < > 252  --   --  224 227  LABCREA 150.80  --   --   --   --   --   --   CREATININE  --   < > 1.58*  < > 1.69* 1.64* 1.50*  < > = values in this interval not displayed. Estimated Creatinine Clearance: 43.4 ml/min (by C-G formula based on Cr of 1.5). No results found for this basename: VANCOTROUGH, VANCOPEAK, VANCORANDOM, GENTTROUGH, GENTPEAK, GENTRANDOM, TOBRATROUGH, TOBRAPEAK, TOBRARND, AMIKACINPEAK, AMIKACINTROU, AMIKACIN,  in the last 72 hours   Microbiology: Recent Results (from the past 720 hour(s))  CULTURE, BLOOD (ROUTINE X 2)     Status: None   Collection Time    02/05/13  8:34 PM      Result Value Range Status   Specimen Description BLOOD HAND RIGHT   Final   Special Requests BOTTLES DRAWN AEROBIC ONLY 8CC   Final   Culture  Setup Time 02/06/2013 01:47   Final   Culture     Final   Value:        BLOOD CULTURE RECEIVED NO GROWTH TO DATE CULTURE WILL BE HELD FOR 5 DAYS BEFORE ISSUING A FINAL NEGATIVE REPORT   Report Status PENDING   Incomplete  CULTURE, BLOOD (ROUTINE X 2)     Status: None   Collection Time    02/05/13  8:57 PM      Result Value Range Status   Specimen Description BLOOD HAND RIGHT   Final   Special  Requests BOTTLES DRAWN AEROBIC AND ANAEROBIC 10CC   Final   Culture  Setup Time 02/06/2013 01:47   Final   Culture     Final   Value:        BLOOD CULTURE RECEIVED NO GROWTH TO DATE CULTURE WILL BE HELD FOR 5 DAYS BEFORE ISSUING A FINAL NEGATIVE REPORT   Report Status PENDING   Incomplete  CULTURE, RESPIRATORY (NON-EXPECTORATED)     Status: None   Collection Time    02/06/13  2:48 AM      Result Value Range Status   Specimen Description ENDOTRACHEAL   Final   Special Requests NONE   Final   Gram Stain     Final   Value: FEW WBC PRESENT, PREDOMINANTLY PMN     RARE SQUAMOUS EPITHELIAL CELLS PRESENT     RARE GRAM POSITIVE COCCI     IN PAIRS   Culture Culture reincubated for better growth   Final   Report Status PENDING   Incomplete  URINE CULTURE     Status: None   Collection Time  02/06/13  3:33 AM      Result Value Range Status   Specimen Description URINE, CLEAN CATCH   Final   Special Requests Normal   Final   Culture  Setup Time 02/06/2013 08:34   Final   Colony Count NO GROWTH   Final   Culture NO GROWTH   Final   Report Status 02/07/2013 FINAL   Final  MRSA PCR SCREENING     Status: None   Collection Time    02/06/13  3:33 AM      Result Value Range Status   MRSA by PCR NEGATIVE  NEGATIVE Final   Comment:            The GeneXpert MRSA Assay (FDA     approved for NASAL specimens     only), is one component of a     comprehensive MRSA colonization     surveillance program. It is not     intended to diagnose MRSA     infection nor to guide or     monitor treatment for     MRSA infections.  CLOSTRIDIUM DIFFICILE BY PCR     Status: None   Collection Time    02/06/13 10:15 AM      Result Value Range Status   C difficile by pcr NEGATIVE  NEGATIVE Final    Medical History: Past Medical History  Diagnosis Date  . COPD (chronic obstructive pulmonary disease)   . Pneumonia   . Hypertension   . HOH (hard of hearing) 12/01/2011    Medications:  Prescriptions prior  to admission  Medication Sig Dispense Refill  . escitalopram (LEXAPRO) 10 MG tablet Take 10 mg by mouth daily.      [name] HYDROcodone-acetaminophen (NORCO/VICODIN) 5-325 MG per tablet Take 1 tablet by mouth every 6 (six) hours as needed for pain.      . Ipratropium-Albuterol (COMBIVENT RESPIMAT) 20-100 MCG/ACT AERS respimat Inhale 1 puff into the lungs every 6 (six) hours.      [name] lamoTRIgine (LAMICTAL) 25 MG tablet Take 25 mg by mouth 2 (two) times daily.       [name] lisinopril-hydrochlorothiazide (PRINZIDE,ZESTORETIC) 20-25 MG per tablet Take 1 tablet by mouth daily.      [name] LORazepam (ATIVAN) 0.5 MG tablet Take 0.5 mg by mouth 4 (four) times daily as needed for anxiety.      [name] losartan (COZAAR) 50 MG tablet Take 50 mg by mouth daily.      [name] tiotropium (SPIRIVA) 18 MCG inhalation capsule Place 18 mcg into inhaler and inhale daily.       Assessment: Zachary Walls is known to pharmacy. 57 y.o. male admitted 02/05/2013 with emphysema, EtoH, subabuse, found down at home 4/17 at home.  Pharmacy has been consulted to change vanc/zosyn to unasyn to cover aspiration PNA.  He is underweight = 56 kg.  His creatinine is down to 1.5 from a high of 1.73.  WBC 14.9. Tmax 99. No + culture data.   Goal of Therapy:  Eradication of infection  Plan:  1. Unasyn 1.5 grams IV q8h 2. F/u renal function and clinical status Thanks Herby Abraham, Pharm.D. [phone] 02/08/2013 8:45 AM

## 2013-02-08 NOTE — Progress Notes (Signed)
Subjective: No more episodes concerning for seizure  Exam: Filed Vitals:   02/08/13 0743  BP:   Pulse:   Temp: 98.7 F (37.1 C)  Resp:    Gen: In bed, NAD MS: Awakens easily to mild stimulation,  ZO:XWRUE, EOMI, Fixates and tracks, blinks to threat bilaterally.  Motor: withdraws to pain bilateral lower extremities, moves uppers spontaneously.  Sensory:responds to nox stim x 4 DTR:4+ bilaterally at the knees, able to elicit knee clonus with spread with quadriceps percussion. 3+ at [facility] bilaterally  Impression: 57 yo M presented with AMS in the setting of possible respiratory insufficiency and hyponatremia, also ? EtOH abuse/withdrawal. He had a respiratory -> cardiac arrest and was intubated. Currently, his exam is similar to  Yesterday. He had an event which appeared clinically to be seizure on video, but the EEG was non-conclusive due to artifact. I suspect aht it was seizure and have started keppra. I would suspect EtOH withdrawal as etiology.    Recommendations: 1) Continue high dose thiamine.  2) MRI brain once stable  3) Continue keppra 500mg  BID.   Ritta Slot, MD [facility] [phone]  If 7pm- 7am, please page neurology on call at [phone].

## 2013-02-08 NOTE — Progress Notes (Signed)
PULMONARY  / CRITICAL CARE MEDICINE  Name: Zachary Walls MRN: [id number] DOB: 25-Feb-1956    ADMISSION DATE:  02/05/2013 CONSULTATION DATE:  40981  REFERRING MD :  Dr. Ignacia Palma PRIMARY SERVICE: PCCM  CHIEF COMPLAINT:  AMS  BRIEF PATIENT DESCRIPTION: Pt was last seen normal 4/16 at 4pm. Son found pt down in fetal position complaining of breathing. Pt had suspicious visitor that may have given pt drugs. 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 Pt continues to be altered and PCCM asked to manage.   SIGNIFICANT EVENTS / STUDIES:  4/17- AMS, admitted to ICU 4/18 - cardiac arrest with 7 minutes of CPR, emergent intubation, on pressor 4/18- eeg -slow, no focus 4/18 echo - 65%, WNL, euvolemic 4/19- clinical findings concerning seizures, keppra, more alert  LINES / TUBES: 4/18 ETT>>> 4/18 LIJ>> 4/18 Foley>>  CULTURES: 4/17 BCx 4/17 resp Cx 4/17 UCx 4/18 cdiff>>>neg  ANTIBIOTICS: 4/17 IV zosyn>>>4/20 4/18 unasyn>>> 4/17 Vanc >>>4/20  SUBJECTIVE:  Seizures?  VITAL SIGNS: Temp:  [98.1 F (36.7 C)-99.7 F (37.6 C)] 98.2 F (36.8 C) (04/20 0430) Pulse Rate:  [62-113] 68 (04/20 0600) Resp:  [0-22] 0 (04/20 0600) BP: (81-131)/(46-78) 103/56 mmHg (04/20 0600) SpO2:  [92 %-98 %] 97 % (04/20 0600) FiO2 (%):  [40 %] 40 % (04/20 0319) Weight:  [55.8 kg (123 lb 0.3 oz)] 55.8 kg (123 lb 0.3 oz) (04/20 0500) HEMODYNAMICS:   VENTILATOR SETTINGS: Vent Mode:  [-] PRVC FiO2 (%):  [40 %] 40 % Set Rate:  [14 bmp] 14 bmp Vt Set:  [500 mL] 500 mL PEEP:  [5 cmH20] 5 cmH20 Plateau Pressure:  [15 cmH20-22 cmH20] 18 cmH20 INTAKE / OUTPUT: Intake/Output     04/19 0701 - 04/20 0700 04/20 0701 - 04/21 0700   I.V. (mL/kg) 758.7 (13.6)    NG/GT 330    IV Piggyback 450    Total Intake(mL/kg) 1538.7 (27.6)    Urine (mL/kg/hr) 1560 (1.2)    Total Output 1560     Net -21.3            PHYSICAL EXAMINATION: General:  Opens eyes , tracks eyes, perrl, not following  commands Neuro:  Per 3mm HEENT:  PERRL, Increased jvd Cardiovascular:  RRR, no m/r/g/S3/S4 Lungs:  BS distant Abdomen:  Colostomy with brown stool drainage, no r/g Musculoskeletal:  Deconditioned  Skin:  Multiple small non-draining abrasions  LABS:  Recent Labs Lab 02/05/13 1858 02/05/13 1931  02/06/13 0408 02/06/13 0500 02/06/13 0930 02/06/13 1033 02/06/13 1034 02/06/13 1638 02/06/13 1652 02/07/13 0442 02/07/13 0500 02/08/13 0305 02/08/13 0500  HGB 14.3  --   --   --  12.5*  --   --   --   --   --   --  10.4*  --  10.1*  WBC 25.5*  --   --   --  18.2*  --   --   --   --   --   --  14.0*  --  14.9*  PLT 279  --   --   --  252  --   --   --   --   --   --  224  --  227  NA 120*  --   < >  --  130*  --  130*  --   --  130*  --  132*  --   --   K 3.8  --   < >  --  3.3*  --  3.5  --   --  3.9  --  4.1  --   --   CL 80*  --   < >  --  91*  --  95*  --   --  98  --  99  --   --   CO2 19  --   < >  --  23  --  24  --   --  22  --  24  --   --   GLUCOSE 88  --   < >  --  145*  --  132*  --   --  153*  --  154*  --   --   BUN 19  --   < >  --  20  --  21  --   --  21  --  23  --   --   CREATININE 1.53*  --   < >  --  1.58*  --  1.73*  --   --  1.69*  --  1.64*  --   --   CALCIUM 9.7  --   < >  --  7.7*  --  7.3*  --   --  7.4*  --  7.7*  --   --   AST 252*  --   --   --   --  249*  --   --   --   --   --  282*  --   --   ALT 50  --   --   --   --  47  --   --   --   --   --  52  --   --   ALKPHOS 77  --   --   --   --  49  --   --   --   --   --  44  --   --   BILITOT 0.9  --   --   --   --  0.4  --   --   --   --   --  0.3  --   --   PROT 7.9  --   --   --   --  5.7*  --   --   --   --   --  5.7*  --   --   ALBUMIN 4.5  --   --   --   --  3.0*  --   --   --   --   --  2.9*  --   --   INR  --   --   --   --   --  1.10  --   --   --   --   --   --   --   --   LATICACIDVEN  --  2.82*  --   --   --   --   --   --   --   --   --   --   --   --   TROPONINI  --   --   --   --   --   --    --  0.87*  --   --   --   --   --   --   PROCALCITON  --   --   --   --  3.68  --   --   --   --   --   --   --   --   --  PHART  --   --   < > 7.302*  --   --   --   --  7.334*  --  7.417  --  7.298*  --   PCO2ART  --   --   < > 43.4  --   --   --   --  37.6  --  34.1*  --  55.6*  --   PO2ART  --   --   < > 121.0*  --   --   --   --  77.0*  --  73.0*  --  78.7*  --   < > = values in this interval not displayed.  Recent Labs Lab 02/07/13 1512 02/07/13 1935 02/07/13 2348 02/08/13 0355 02/08/13 0716  GLUCAP 145* 155* 129* 130* 142*    4/18 CXR: big lungs, ETT and Left IJ in place 4/20 rt hilar infiltate? Vs chronic, ett wnl  ASSESSMENT / PLAN:  PULMONARY A: Severe emphysema with bullae; COPD      VDRF- emergent intubation following cardiac arrest R/o asp P:   -duonebs q2 to q4h -IV solumedrol reduce -ABG reviewed, increase T V , see dead space on pcxr -attempt sbt cpap5 ps 5, goal 2 hrs -need neuro improvement  CARDIOVASCULAR A:  Hx of HTN     Cardiac arrest with 7 min of CPR on 4/18 Shock, post arrest - improved P:  -follow na Even balance goal Low threshold lasix  RENAL A:  ARF - FeNa 0.2%, likely prerenal- Creatinine trending up     Metabolic acidosis and metabolic alkalosis: resolving, multifactorial   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 Improving with slow correction P:   -tsh wnl -chem in am  Cont nacl, reduce to kvo May need lasix in am   GASTROINTESTINAL A:  emergent Hartmann's procedure 3/13 2/2 SBO with colostomy non-revised by surgery 2/2 poor nutrition and overall medical status - CT abdomen with no intraabdominal process   Elevated Transaminases -AST elevate = etoh? P:   -ppi lft again in am  continue TF   HEMATOLOGIC A:  Anemia, normocytic. No signs of bleeding, Hg stable, anemia of chronic disease? And dilution  now      Leucocytosis - trending down, no signs of infection, plts stable P:  -sub q h  INFECTIOUS  f/u HIV - neg - C. Diff stool PCR - pending A:  R/o aspiration, concern rt hilum P:   - Vanc, Zosyn, narrow to unasyn  ENDOCRINE A:  No active issues P:   -SSI  NEUROLOGIC eeg continuous, neg focus A:  AMS in setting hyponatremia, chronic EtOH, THC+. CT head was negative. NH4 nrml      S/p arrest anoxia, concern seziure P:   -appreciate neurologyahelp, mri consideration for prognosis in next 48 hrs -fent, limit sedation keppra -Ativan PRN for signs of agitation/withdrawal -thiamine and folic acid daily, IV high dose, agree TODAY'S SUMMARY:  INcrease TV , sbt, keppra, narrow to unasyn  I have personally obtained a history, examined the patient, evaluated laboratory and imaging results, formulated the assessment and plan and placed orders.  CRITICAL CARE: The patient is critically ill with multiple organ systems failure and requires high complexity decision making for assessment and support, frequent evaluation and titration of therapies, application of advanced monitoring technologies and extensive interpretation of multiple databases.  Critical Care Time devoted to patient care services described in this note is 30 minutes.   Mcarthur Rossetti. Tyson Alias, MD, FACP Pgr: [phone] [facility]

## 2013-02-09 ENCOUNTER — Inpatient Hospital Stay (HOSPITAL_COMMUNITY): Payer: Medicaid Other

## 2013-02-09 ENCOUNTER — Encounter: Payer: Self-pay | Admitting: Emergency Medicine

## 2013-02-09 ENCOUNTER — Encounter (HOSPITAL_COMMUNITY): Payer: Self-pay | Admitting: Radiology

## 2013-02-09 DIAGNOSIS — G931 Anoxic brain damage, not elsewhere classified: Secondary | ICD-10-CM | POA: Diagnosis present

## 2013-02-09 DIAGNOSIS — J9620 Acute and chronic respiratory failure, unspecified whether with hypoxia or hypercapnia: Secondary | ICD-10-CM

## 2013-02-09 DIAGNOSIS — I469 Cardiac arrest, cause unspecified: Secondary | ICD-10-CM | POA: Diagnosis present

## 2013-02-09 LAB — POCT I-STAT 3, ART BLOOD GAS (G3+)
Acid-Base Excess: 2 mmol/L (ref 0.0–2.0)
Bicarbonate: 27.2 mEq/L — ABNORMAL HIGH (ref 20.0–24.0)
O2 Saturation: 97 %
Patient temperature: 98.5
TCO2: 29 mmol/L (ref 0–100)

## 2013-02-09 LAB — GLUCOSE, CAPILLARY
Glucose-Capillary: 134 mg/dL — ABNORMAL HIGH (ref 70–99)
Glucose-Capillary: 92 mg/dL (ref 70–99)

## 2013-02-09 LAB — COMPREHENSIVE METABOLIC PANEL
BUN: 28 mg/dL — ABNORMAL HIGH (ref 6–23)
CO2: 29 mEq/L (ref 19–32)
Calcium: 8.6 mg/dL (ref 8.4–10.5)
Creatinine, Ser: 1.31 mg/dL (ref 0.50–1.35)
GFR calc Af Amer: 69 mL/min — ABNORMAL LOW (ref >90)
GFR calc non Af Amer: 59 mL/min — ABNORMAL LOW (ref >90)
Glucose, Bld: 105 mg/dL — ABNORMAL HIGH (ref 70–99)

## 2013-02-09 LAB — CBC WITH DIFFERENTIAL/PLATELET
Hemoglobin: 9.9 g/dL — ABNORMAL LOW (ref 13.0–17.0)
Lymphs Abs: 1.3 10*3/uL (ref 0.7–4.0)
Monocytes Relative: 13 % — ABNORMAL HIGH (ref 3–12)
Neutro Abs: 8.2 10*3/uL — ABNORMAL HIGH (ref 1.7–7.7)
Neutrophils Relative %: 75 % (ref 43–77)
RBC: 3.17 MIL/uL — ABNORMAL LOW (ref 4.22–5.81)
WBC: 10.9 10*3/uL — ABNORMAL HIGH (ref 4.0–10.5)

## 2013-02-09 MED ORDER — PANTOPRAZOLE SODIUM 40 MG PO PACK
40.0000 mg | PACK | Freq: Every day | ORAL | Status: DC
  Administered 2013-02-09 – 2013-02-23 (×15): 40 mg
  Filled 2013-02-09 (×15): qty 20

## 2013-02-09 MED ORDER — PROPOFOL 10 MG/ML IV EMUL
INTRAVENOUS | Status: AC
  Administered 2013-02-09: 1000 mg via INTRAVENOUS
  Filled 2013-02-09: qty 100

## 2013-02-09 MED ORDER — CHLORHEXIDINE GLUCONATE 0.12 % MT SOLN
15.0000 mL | Freq: Two times a day (BID) | OROMUCOSAL | Status: DC
  Administered 2013-02-09 – 2013-02-23 (×27): 15 mL via OROMUCOSAL
  Filled 2013-02-09 (×31): qty 15

## 2013-02-09 MED ORDER — OSMOLITE 1.2 CAL PO LIQD
1000.0000 mL | ORAL | Status: DC
  Administered 2013-02-09 – 2013-02-12 (×4): 1000 mL
  Filled 2013-02-09 (×7): qty 1000

## 2013-02-09 MED ORDER — PROPOFOL 10 MG/ML IV EMUL: 5.0000 ug/kg/min | INTRAVENOUS | Status: DC

## 2013-02-09 MED ORDER — SODIUM CHLORIDE 0.9 % IV SOLN
25.0000 ug/h | INTRAVENOUS | Status: DC
  Administered 2013-02-10: 100 ug/h via INTRAVENOUS
  Administered 2013-02-10: 200 ug/h via INTRAVENOUS
  Filled 2013-02-09 (×3): qty 50

## 2013-02-09 MED ORDER — FOLIC ACID 1 MG PO TABS
1.0000 mg | ORAL_TABLET | Freq: Every day | ORAL | Status: DC
Start: 2013-02-10 — End: 2013-02-23
  Administered 2013-02-10 – 2013-02-23 (×13): 1 mg
  Filled 2013-02-09 (×14): qty 1

## 2013-02-09 MED ORDER — BIOTENE DRY MOUTH MT LIQD
15.0000 mL | Freq: Four times a day (QID) | OROMUCOSAL | Status: DC
  Administered 2013-02-09 – 2013-02-23 (×53): 15 mL via OROMUCOSAL

## 2013-02-09 MED ORDER — IPRATROPIUM BROMIDE 0.02 % IN SOLN
0.5000 mg | Freq: Four times a day (QID) | RESPIRATORY_TRACT | Status: DC
  Administered 2013-02-09 – 2013-02-13 (×15): 0.5 mg via RESPIRATORY_TRACT
  Filled 2013-02-09 (×15): qty 2.5

## 2013-02-09 MED ORDER — FENTANYL BOLUS VIA INFUSION
25.0000 ug | Freq: Four times a day (QID) | INTRAVENOUS | Status: DC | PRN
  Administered 2013-02-11: 50 ug via INTRAVENOUS
  Filled 2013-02-09: qty 100

## 2013-02-09 NOTE — Progress Notes (Signed)
D.w son. Will have MRi report in am . He reported that [facility] called and patient was trying to get more lorazepam and hydrocodone pre found down and he had empty bottle of those drugs, about 7 days worth gone at home  [employer]. Tyson Alias, MD, FACP Pgr: [phone]  Pulmonary & Critical Care

## 2013-02-09 NOTE — Progress Notes (Signed)
ANTIBIOTIC CONSULT NOTE - FOLLOW UP  Pharmacy Consult for unasyn Indication: PNA  Allergies  Allergen Reactions  . Codeine Rash    Patient Measurements: Height: 5' 6.93" (170 cm) Weight: 118 lb 13.3 oz (53.9 kg) IBW/kg (Calculated) : 65.94  Vital Signs: Temp: 99.3 F (37.4 C) (04/21 0800) Temp src: Oral (04/21 0423) BP: 143/77 mmHg (04/21 0800) Pulse Rate: 84 (04/21 0800) Intake/Output from previous day: 04/20 0701 - 04/21 0700 In: 1755 [I.V.:890; NG/GT:360; IV Piggyback:505] Out: 1475 [Urine:1250; Stool:225] Intake/Output from this shift: Total I/O In: 140 [I.V.:20; NG/GT:15; IV Piggyback:105] Out: 125 [Urine:125]  Labs:  Recent Labs  02/07/13 0500 02/08/13 0500 02/09/13 0330  WBC 14.0* 14.9* 10.9*  HGB 10.4* 10.1* 9.9*  PLT 224 227 202  CREATININE 1.64* 1.50* 1.31   Estimated Creatinine Clearance: 48 ml/min (by C-G formula based on Cr of 1.31). No results found for this basename: VANCOTROUGH, VANCOPEAK, VANCORANDOM, GENTTROUGH, GENTPEAK, GENTRANDOM, TOBRATROUGH, TOBRAPEAK, TOBRARND, AMIKACINPEAK, AMIKACINTROU, AMIKACIN,  in the last 72 hours    Assessment: 57 yo male on unasyn D2/Abx D5 for concern of aspiration PNA.  WBC= 10.9, tmax=99.3 and SCr= 1.31 and noted with trend down  Antibiotics: 4/17 >> 4/20 Zosyn 4/18 >> 4/20 Vanc 4/20>> unasyn   Cultures: 4/18 resp: non-path flora 4/17 blood: ngtd 4/18 urine- neg  Plan:  -No unasyn changes needed -Will follow final cultures -Consider adding a stop date?  Harland German, Pharm D 02/09/2013 8:49 AM

## 2013-02-09 NOTE — Progress Notes (Addendum)
PULMONARY  / CRITICAL CARE MEDICINE  Name: Zachary Walls MRN: [id number] DOB: 1956-01-01    ADMISSION DATE:  02/05/2013 CONSULTATION DATE:  32951  REFERRING MD :  Dr. Ignacia Palma PRIMARY SERVICE: PCCM  CHIEF COMPLAINT:  AMS  BRIEF PATIENT DESCRIPTION: Pt was last seen normal 4/16 at 4pm. Son found pt down in fetal position complaining of breathing. Pt had suspicious visitor that may have given pt drugs. 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 Pt continues to be altered and PCCM asked to manage.   SIGNIFICANT EVENTS / STUDIES:  4/17- AMS, admitted to ICU 4/18 - cardiac arrest with 7 minutes of CPR, emergent intubation, on pressor 4/18- eeg -slow, no focus 4/18 echo - 65%, WNL, euvolemic 4/19- clinical findings concerning seizures, keppra, more alert  LINES / TUBES: 4/18 ETT>>> 4/18 LIJ>> 4/18 Foley>>  CULTURES: 4/17 BCx 4/17 resp Cx 4/17 UCx 4/18 cdiff>>>neg  ANTIBIOTICS: 4/17 IV zosyn>>>4/20 4/18 unasyn>>> 4/17 Vanc >>>4/20  SUBJECTIVE:  He is shaking both legs. He is alert but not following commands  VITAL SIGNS: Temp:  [98.5 F (36.9 C)-99.1 F (37.3 C)] 98.5 F (36.9 C) (04/21 0423) Pulse Rate:  [63-117] 70 (04/21 0630) Resp:  [6-19] 14 (04/21 0630) BP: (97-155)/(55-101) 127/63 mmHg (04/21 0630) SpO2:  [93 %-99 %] 97 % (04/21 0630) FiO2 (%):  [40 %] 40 % (04/21 0400) Weight:  [118 lb 13.3 oz (53.9 kg)] 118 lb 13.3 oz (53.9 kg) (04/21 0530) HEMODYNAMICS: CVP:  [3 mmHg] 3 mmHg VENTILATOR SETTINGS: Vent Mode:  [-] PRVC FiO2 (%):  [40 %] 40 % Set Rate:  [14 bmp] 14 bmp Vt Set:  [500 mL-550 mL] 550 mL PEEP:  [5 cmH20] 5 cmH20 Plateau Pressure:  [17 cmH20-28 cmH20] 17 cmH20 INTAKE / OUTPUT: Intake/Output     04/20 0701 - 04/21 0700 04/21 0701 - 04/22 0700   I.V. (mL/kg) 850 (15.8)    NG/GT 345    IV Piggyback 505    Total Intake(mL/kg) 1700 (31.5)    Urine (mL/kg/hr) 1250 (1)    Stool 225 (0.2)    Total Output 1475     Net  +225            PHYSICAL EXAMINATION: General:  Opens eyes , tracks eyes, perrl, not following commands Neuro:  Per 3mm, severe clonus on wake up assessment HEENT:  PERRL, Increased jvd Cardiovascular:  RRR, no m/r/g/S3/S4 Lungs:  BS distant Abdomen:  Colostomy with brown stool drainage, no r/g Musculoskeletal:  Deconditioned  Skin:  Multiple small non-draining abrasions  LABS:  Recent Labs Lab 02/05/13 1858 02/05/13 1931  02/06/13 0408 02/06/13 0500 02/06/13 0930  02/06/13 1034  02/07/13 0442 02/07/13 0500 02/08/13 0305 02/08/13 0500 02/09/13 0330 02/09/13 0447  HGB 14.3  --   --   --  12.5*  --   --   --   --   --  10.4*  --  10.1* 9.9*  --   WBC 25.5*  --   --   --  18.2*  --   --   --   --   --  14.0*  --  14.9* 10.9*  --   PLT 279  --   --   --  252  --   --   --   --   --  224  --  227 202  --   NA 120*  --   < >  --  130*  --   < >  --   < >  --  132*  --  137 139  --   K 3.8  --   < >  --  3.3*  --   < >  --   < >  --  4.1  --  4.6 4.1  --   CL 80*  --   < >  --  91*  --   < >  --   < >  --  99  --  103 106  --   CO2 19  --   < >  --  23  --   < >  --   < >  --  24  --  28 29  --   GLUCOSE 88  --   < >  --  145*  --   < >  --   < >  --  154*  --  132* 105*  --   BUN 19  --   < >  --  20  --   < >  --   < >  --  23  --  26* 28*  --   CREATININE 1.53*  --   < >  --  1.58*  --   < >  --   < >  --  1.64*  --  1.50* 1.31  --   CALCIUM 9.7  --   < >  --  7.7*  --   < >  --   < >  --  7.7*  --  8.3* 8.6  --   AST 252*  --   --   --   --  249*  --   --   --   --  282*  --  354* 324*  --   ALT 50  --   --   --   --  47  --   --   --   --  52  --  73* 82*  --   ALKPHOS 77  --   --   --   --  49  --   --   --   --  44  --  46 34*  --   BILITOT 0.9  --   --   --   --  0.4  --   --   --   --  0.3  --  0.2* 0.3  --   PROT 7.9  --   --   --   --  5.7*  --   --   --   --  5.7*  --  5.8* 5.4*  --   ALBUMIN 4.5  --   --   --   --  3.0*  --   --   --   --  2.9*  --  2.8* 2.7*  --    INR  --   --   --   --   --  1.10  --   --   --   --   --   --   --   --   --   LATICACIDVEN  --  2.82*  --   --   --   --   --   --   --   --   --   --   --   --   --   TROPONINI  --   --   --   --   --   --   --  0.87*  --   --   --   --   --   --   --   PROCALCITON  --   --   --   --  3.68  --   --   --   --   --   --   --   --   --   --   PHART  --   --   < > 7.302*  --   --   --   --   < > 7.417  --  7.298*  --   --  7.397  PCO2ART  --   --   < > 43.4  --   --   --   --   < > 34.1*  --  55.6*  --   --  44.2  PO2ART  --   --   < > 121.0*  --   --   --   --   < > 73.0*  --  78.7*  --   --  93.0  < > = values in this interval not displayed.  Recent Labs Lab 02/08/13 1123 02/08/13 1509 02/08/13 1929 02/08/13 2353 02/09/13 0350  GLUCAP 134* 124* 114* 104* 95    4/18 CXR: big lungs, ETT and Left IJ in place 4/20 rt hilar infiltate? Vs chronic, ett wnl 4/21: emphysema. N o focal disease  ASSESSMENT / PLAN: Principal Problem:   Cardiac arrest Active Problems:   COPD (chronic obstructive pulmonary disease)   Altered mental status   Anoxic encephalopathy   Respiratory failure, acute and chronic   PULMONARY A: Severe emphysema with bullae; COPD      VDRF- emergent intubation following cardiac arrest R/o asp P:   -Duonebs q2 to q4h -IV solumedrol reduce- 40 mg BID -attempt sbt cpap5 ps 5, goal 2 hrs>>failed and returned to vent 4/21    CARDIOVASCULAR A:  Hx of HTN     Cardiac arrest with 7 min of CPR on 4/18 Shock, post arrest - improved P:  Even balance at goal   RENAL A:  ARF - FeNa 0.2%, likely prerenal- Creatinine trending up     Metabolic acidosis and metabolic alkalosis: resolving, multifactorial   Likely beer proteinemia / tea and toast  UDS +THC, -EtOH,    Possible Rhabdomyolysis - Large Hg on UA but only 7-10 RBC   Hyponatremia- hypotonic (serum osmol 263)- Resolved P:   KVO IVF   GASTROINTESTINAL A:  emergent Hartmann's procedure 3/13 2/2 SBO with  colostomy non-revised by surgery 2/2 poor nutrition and overall medical status - CT abdomen with no intraabdominal process   Elevated Transaminases -AST elevated likely 2/2 alcohol - improving -4/21 P:   -PPI -continue TF   HEMATOLOGIC A:  Anemia, normocytic. No signs of bleeding, Hg stable, anemia of chronic disease? And dilution now      Leucocytosis - trending down, no signs of infection, plts stable P:  -sub q heparin  INFECTIOUS  f/u HIV - neg - C. Diff stool PCR - negative Blood cultures( 4/17)- NGTD x2 A:  R/o aspiration, concern rt hilum P:   -  Unasyn  To end 4/23  ENDOCRINE A:  No active issues P:   -SSI  NEUROLOGIC eeg continuous, neg focus A:  AMS in setting hyponatremia, chronic EtOH, THC+. CT head was negative. 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  Pt still VDRF. Plan MRI 4/21  I have personally obtained a history, examined the patient, evaluated laboratory and imaging results, formulated the assessment and plan and placed orders.  CRITICAL CARE: The patient is critically ill with multiple organ systems failure and requires high complexity decision making for assessment and support, frequent evaluation and titration of therapies, application of advanced monitoring technologies and extensive interpretation of multiple databases. Critical Care Time devoted to patient care services described in this note is 30 minutes.   SAWHNEY,MEGHA 02/09/2013 .  Dorcas Carrow Beeper  [phone]  Cell  [phone]  If no response or cell goes to voicemail, call beeper [phone]

## 2013-02-09 NOTE — Progress Notes (Addendum)
History from initial consult: Zachary Walls is an 57 y.o. male who last talked to his family on 02/04/2013. His son-in-law stopped by his house 02/05/2013 at 1600. He found him lying in the floor in a fetal position, "ash colored, and barely breathing". He was brought in by EMS. Upon arrival patient had respiratory arrest, followed by cardiac arrest (7 minutes of documented CPR) and was intubated early 02/06/2013. Since that time family describes him as having episodes of stiffening. He is undergoing continous EEG monitoring.   Mild hyponatremia, 130. Significant History of alcohol abuse, > 12 beers daily. MCV normal  EEG with single episode clinically concerning for seizure on video, unclear on EEG due to artifact.   Subjective:  No more seizure type episodes described. Patient with eyes opened and tracks me around room. No sign of distress or discomfort.  Objective: BP 120/64  Pulse 71  Temp(Src) 99.3 F (37.4 C) (Oral)  Resp 14  Ht 5' 6.93" (1.7 m)  Wt 53.9 kg (118 lb 13.3 oz)  BMI 18.65 kg/m2  SpO2 97%  CBGs  Recent Labs  02/07/13 1935 02/07/13 2348 02/08/13 0355 02/08/13 0716 02/08/13 1123 02/08/13 1509 02/08/13 1929 02/08/13 2353 02/09/13 0350 02/09/13 0808  GLUCAP 155* 129* 130* 142* 134* 124* 114* 104* 95 126*     Medications: Scheduled: . albuterol  2.5 mg Nebulization Q4H  . ampicillin-sulbactam (UNASYN) IV  1.5 g Intravenous Q8H  . antiseptic oral rinse  15 mL Mouth Rinse QID  . chlorhexidine  15 mL Mouth Rinse BID  . dextrose  1 ampule Intravenous Once  . feeding supplement  30 mL Per Tube BID  . folic acid  1 mg Oral Daily  . heparin  5,000 Units Subcutaneous Q8H  . insulin aspart  0-9 Units Subcutaneous Q4H  . ipratropium  0.5 mg Nebulization Q4H  . levETIRAcetam  500 mg Intravenous Q12H  . methylPREDNISolone (SOLU-MEDROL) injection  40 mg Intravenous Q12H  . multivitamin  5 mL Per Tube Daily  . pantoprazole (PROTONIX) IV  40 mg Intravenous QHS  .  thiamine (VITAMIN B1) IVPB  500 mg Intravenous Q8H    Neurologic Exam: Mental Status: Awakens easily. Fixates and tracks, but does not follow commands.  Cranial Nerves: Extraocular movements intact. Patient can look to me and can track to my movement around the room.  Pupils reactive bilaterally. Blinks to threat. VIII-hearing grossly intact Motor: moves all extremities spontaneously upper>lower Sensory: responds to noxious stimuli all extremitiesDeep Tendon Reflexes: 4+ Knees with sustained clonus bilaterally ( also with dorsiflexion). Brisk ark reflexes as well.   Lab Results: CBC:  Recent Labs Lab 02/08/13 0500 02/09/13 0330  WBC 14.9* 10.9*  NEUTROABS 13.4* 8.2*  HGB 10.1* 9.9*  HCT 29.2* 28.1*  MCV 88.2 88.6  PLT 227 202   Basic Metabolic Panel:  Recent Labs Lab 02/08/13 0500 02/09/13 0330  NA 137 139  K 4.6 4.1  CL 103 106  CO2 28 29  GLUCOSE 132* 105*  BUN 26* 28*  CREATININE 1.50* 1.31  CALCIUM 8.3* 8.6   Liver Function Tests:  Recent Labs Lab 02/08/13 0500 02/09/13 0330  AST 354* 324*  ALT 73* 82*  ALKPHOS 46 34*  BILITOT 0.2* 0.3  PROT 5.8* 5.4*  ALBUMIN 2.8* 2.7*   Thyroid Function Tests:  Recent Labs Lab 02/05/13 2254  TSH 0.455   Coagulation:  Recent Labs Lab 02/06/13 0930  LABPROT 14.1  INR 1.10     Study Results: Dg Chest [facility]  1 View Emphysema and mild vascular congestion.  No focal disease.   Original Report Authenticated By: Holley Dexter, M.D.    Dg Chest Port 1 View 02/08/2013  1.  Slightly lower inspiratory volumes with increasing bibasilar opacities favored to reflect atelectasis. 2.  Background COPD and emphysema 3.  The proximal side hole of the nasogastric tube projects over the GE junction.  Consider advancing 3-5 centimeters for more optimal positioning.  Otherwise, stable and satisfactory support apparatus.       LOS: 4 days   Job Founds, MBA, [facility] Pager [phone] 02/09/2013   10:11 AM I have seen and evaluated the patient. I have reviewed the above note and made appropriate changes.      Impression:  57 yo M presented with AMS in the setting of possible respiratory insufficiency and hyponatremia, also possibe EtOH abuse with withdrawal. He had a respiratory to cardiac arrest and was intubated. Currently, his exam is similar to previosu days. Concern for seizure noted on prolong EEG monitor; pt started on Keppra with no further seizure events.  I would suspect EtOH withdrawal as etiology. MRI when stable from medicine standpoint.  His exam appears most concerning for isolated aphasia, and an MRI could be helpful. The hyperreflexia could be a manifestation of etoh withdrawal, or could be a sign of hypoxic injury to the corticospinal tracks.   1) MRI brain when able 2) Would continue keppra for now, though unliekly to need long term AED therapy.    Ritta Slot, MD [facility] [phone]  If 7pm- 7am, please page neurology on call at [phone].

## 2013-02-09 NOTE — Progress Notes (Signed)
NUTRITION FOLLOW UP  Intervention:    Slow TF advancement, given risk for refeeding syndrome.   Recommend check magnesium, phosphorus, potassium daily for 2-3 more days to ensure no refeeding issues.  Increase Osmolite 1.2 by 10 ml every 12 hours to goal rate of 50 ml/h. Continue Prostat 30 ml BID for a total intake of 1640 kcals, 97 gm protein, 984 ml free water daily.  Nutrition Dx:   Inadequate oral intake related to inability to eat as evidenced by NPO status. Ongoing.  Goal:   Intake to meet >90% of estimated nutrition needs, unmet.  Monitor:   TF tolerance/adequacy, weight trend, labs, vent status.  Assessment:   Neurology now following for seizure like activity. He is undergoing continous EEG monitoring.   Patient remains intubated on ventilator support.  MV: 10.7 Temp:Temp (24hrs), Avg:98.8 F (37.1 C), Min:98.5 F (36.9 C), Max:99.3 F (37.4 C)  Patient is tolerating TF well to meet 38% of estimated calorie needs and 63% of estimated protein needs. Phosphorus, magnesium, and potassium are WNL today.  Height: Ht Readings from Last 1 Encounters:  02/05/13 5' 6.93" (1.7 m)    Weight Status:  stable Wt Readings from Last 1 Encounters:  02/09/13 118 lb 13.3 oz (53.9 kg)  02/06/13  117 lb 1 oz (53.1 kg)    Re-estimated needs:  Kcal: 1650 Protein: 80-100 gm Fluid: 1.7-1.8 L  Skin: no problems noted  Diet Order:  NPO  TF Order: Osmolite 1.2 at 15 ml/h with Prostat 30 ml BID providing 632 kcals, 50 gm protein, 295 ml free water daily.   Intake/Output Summary (Last 24 hours) at 02/09/13 1355 Last data filed at 02/09/13 0900  Gross per 24 hour  Intake   1483 ml  Output   1325 ml  Net    158 ml    Last BM: 4/21 (colostomy)   Labs:   Recent Labs Lab 02/07/13 0500 02/08/13 0500 02/09/13 0330  NA 132* 137 139  K 4.1 4.6 4.1  CL 99 103 106  CO2 24 28 29   BUN 23 26* 28*  CREATININE 1.64* 1.50* 1.31  CALCIUM 7.7* 8.3* 8.6  MG  --   --  2.4  PHOS   --   --  2.8  GLUCOSE 154* 132* 105*    CBG (last 3)   Recent Labs  02/09/13 0350 02/09/13 0808 02/09/13 1219  GLUCAP 95 126* 134*    Scheduled Meds: . albuterol  2.5 mg Nebulization Q4H  . ampicillin-sulbactam (UNASYN) IV  1.5 g Intravenous Q8H  . antiseptic oral rinse  15 mL Mouth Rinse QID  . chlorhexidine  15 mL Mouth Rinse BID  . dextrose  1 ampule Intravenous Once  . feeding supplement  30 mL Per Tube BID  . folic acid  1 mg Oral Daily  . heparin  5,000 Units Subcutaneous Q8H  . insulin aspart  0-9 Units Subcutaneous Q4H  . ipratropium  0.5 mg Nebulization Q4H  . levETIRAcetam  500 mg Intravenous Q12H  . methylPREDNISolone (SOLU-MEDROL) injection  40 mg Intravenous Q12H  . multivitamin  5 mL Per Tube Daily  . pantoprazole (PROTONIX) IV  40 mg Intravenous QHS  . thiamine (VITAMIN B1) IVPB  500 mg Intravenous Q8H    Continuous Infusions: . feeding supplement (OSMOLITE 1.2 CAL) 1,000 mL (02/06/13 1603)  . fentaNYL infusion INTRAVENOUS 100 mcg/hr (02/09/13 1032)  . midazolam (VERSED) infusion 4 mg/hr (02/09/13 1216)    Joaquin Courts, RD, LDN, CNSC Pager [phone] After  Hours Pager [phone]

## 2013-02-09 NOTE — Progress Notes (Signed)
D/w son, I updated him on clinical information. He has concerns about why "he was in ED so long" and code that occurred. He also has called police to make sure he wasnt "poisoned". We reviewed our negative tox screens, EG, mannitol etc. He will call risk management in am to get those ? Answered about ER. ( I offered to notify them for him) We discussed MRi planned as well. We discussed concerns etoh WD complications, anoxia and COPD as issues as well. Will follow MRi and report to him  Mcarthur Rossetti. Tyson Alias, MD, FACP Pgr: [phone] [facility]

## 2013-02-10 DIAGNOSIS — E43 Unspecified severe protein-calorie malnutrition: Secondary | ICD-10-CM

## 2013-02-10 LAB — GLUCOSE, CAPILLARY
Glucose-Capillary: 133 mg/dL — ABNORMAL HIGH (ref 70–99)
Glucose-Capillary: 140 mg/dL — ABNORMAL HIGH (ref 70–99)
Glucose-Capillary: 163 mg/dL — ABNORMAL HIGH (ref 70–99)
Glucose-Capillary: 179 mg/dL — ABNORMAL HIGH (ref 70–99)
Glucose-Capillary: 97 mg/dL (ref 70–99)
Glucose-Capillary: 98 mg/dL (ref 70–99)

## 2013-02-10 LAB — CBC
Hemoglobin: 10.2 g/dL — ABNORMAL LOW (ref 13.0–17.0)
MCHC: 35.5 g/dL (ref 30.0–36.0)
Platelets: 200 10*3/uL (ref 150–400)
RBC: 3.23 MIL/uL — ABNORMAL LOW (ref 4.22–5.81)

## 2013-02-10 LAB — BASIC METABOLIC PANEL
CO2: 31 mEq/L (ref 19–32)
GFR calc non Af Amer: 70 mL/min — ABNORMAL LOW (ref >90)
Glucose, Bld: 99 mg/dL (ref 70–99)
Potassium: 3.7 mEq/L (ref 3.5–5.1)
Sodium: 145 mEq/L (ref 135–145)

## 2013-02-10 MED ORDER — BISOPROLOL FUMARATE 5 MG PO TABS
5.0000 mg | ORAL_TABLET | Freq: Every day | ORAL | Status: DC
  Administered 2013-02-10 – 2013-02-11 (×2): 5 mg via ORAL
  Filled 2013-02-10 (×2): qty 1

## 2013-02-10 MED ORDER — DEXMEDETOMIDINE HCL IN NACL 200 MCG/50ML IV SOLN
0.2000 ug/kg/h | INTRAVENOUS | Status: DC
  Administered 2013-02-10: 1.2 ug/kg/h via INTRAVENOUS
  Administered 2013-02-10: 0.393 ug/kg/h via INTRAVENOUS
  Administered 2013-02-10: 1 ug/kg/h via INTRAVENOUS
  Administered 2013-02-10: 1.1 ug/kg/h via INTRAVENOUS
  Administered 2013-02-11: 1.2 ug/kg/h via INTRAVENOUS
  Administered 2013-02-11: 1.1 ug/kg/h via INTRAVENOUS
  Administered 2013-02-11: 1 ug/kg/h via INTRAVENOUS
  Administered 2013-02-11: 1.2 ug/kg/h via INTRAVENOUS
  Administered 2013-02-11: 1.1 ug/kg/h via INTRAVENOUS
  Administered 2013-02-11: 1 ug/kg/h via INTRAVENOUS
  Administered 2013-02-11: 1.2 ug/kg/h via INTRAVENOUS
  Administered 2013-02-12 (×2): 0.9 ug/kg/h via INTRAVENOUS
  Administered 2013-02-12: 1.1 ug/kg/h via INTRAVENOUS
  Administered 2013-02-12: 1.2 ug/kg/h via INTRAVENOUS
  Administered 2013-02-12: 1 ug/kg/h via INTRAVENOUS
  Filled 2013-02-10 (×16): qty 50

## 2013-02-10 MED ORDER — METOPROLOL TARTRATE 1 MG/ML IV SOLN
INTRAVENOUS | Status: AC
  Administered 2013-02-10: 5 mg via INTRAVENOUS
  Filled 2013-02-10: qty 5

## 2013-02-10 MED ORDER — METOPROLOL TARTRATE 1 MG/ML IV SOLN: 5.0000 mg | Freq: Once | INTRAVENOUS | Status: AC

## 2013-02-10 MED ORDER — MIDAZOLAM HCL 2 MG/2ML IJ SOLN
2.0000 mg | INTRAMUSCULAR | Status: DC | PRN
  Administered 2013-02-10: 2 mg via INTRAVENOUS
  Administered 2013-02-11: 4 mg via INTRAVENOUS
  Filled 2013-02-10: qty 4
  Filled 2013-02-10: qty 2

## 2013-02-10 MED ORDER — FENTANYL CITRATE 0.05 MG/ML IJ SOLN
25.0000 ug | INTRAMUSCULAR | Status: DC | PRN
  Administered 2013-02-10 – 2013-02-13 (×5): 50 ug via INTRAVENOUS
  Filled 2013-02-10 (×5): qty 2
  Filled 2013-02-10: qty 4

## 2013-02-10 NOTE — Progress Notes (Addendum)
PULMONARY  / CRITICAL CARE MEDICINE  Name: Zachary Walls MRN: [id number] DOB: 03-28-1956    ADMISSION DATE:  02/05/2013 CONSULTATION DATE:  41324  REFERRING MD :  Dr. Ignacia Palma PRIMARY SERVICE: PCCM  CHIEF COMPLAINT:  AMS  BRIEF PATIENT DESCRIPTION: Pt was last seen normal 4/16 at 4pm. Son found pt down in fetal position complaining of breathing. Pt had suspicious visitor that may have given pt drugs. 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 Pt continues to be altered and PCCM asked to manage.   SIGNIFICANT EVENTS / STUDIES:  4/17- AMS, admitted to ICU 4/18 - cardiac arrest with 7 minutes of CPR, emergent intubation, on pressor 4/18- eeg -slow, no focus 4/18 echo - 65%, WNL, euvolemic 4/19- clinical findings concerning seizures, keppra, more alert 4/22 MRI brain: Negative for acute infract. No acute intracranial abnormality  LINES / TUBES: 4/18 ETT>>> 4/18 LIJ>> 4/18 Foley>>  CULTURES: 4/17 BCx>>>neg  4/17 resp Cx>>neg 4/17 UCx>>neg 4/18 cdiff>>>neg  ANTIBIOTICS: 4/17 IV zosyn>>>4/20 4/18 unasyn>>>  Plan stop date 4/23 4/17 Vanc >>>4/20  SUBJECTIVE: He is alert but won't follow commands- biting on his ET tube. Agitated at times. HTNive and tachycardia   VITAL SIGNS: Temp:  [98.5 F (36.9 C)-100.8 F (38.2 C)] 98.5 F (36.9 C) (04/22 0354) Pulse Rate:  [62-104] 70 (04/22 0630) Resp:  [11-18] 14 (04/22 0630) BP: (120-160)/(62-94) 135/65 mmHg (04/22 0630) SpO2:  [96 %-100 %] 99 % (04/22 0630) FiO2 (%):  [40 %] 40 % (04/22 0400) Weight:  [121 lb 0.5 oz (54.9 kg)] 121 lb 0.5 oz (54.9 kg) (04/22 0600) HEMODYNAMICS: CVP:  [4 mmHg] 4 mmHg VENTILATOR SETTINGS: Vent Mode:  [-] PRVC FiO2 (%):  [40 %] 40 % Set Rate:  [14 bmp] 14 bmp Vt Set:  [550 mL] 550 mL PEEP:  [5 cmH20] 5 cmH20 Plateau Pressure:  [14 cmH20-24 cmH20] 18 cmH20 INTAKE / OUTPUT: Intake/Output     04/21 0701 - 04/22 0700 04/22 0701 - 04/23 0700   I.V. (mL/kg) 838.5 (15.3)     NG/GT 510    IV Piggyback 360    Total Intake(mL/kg) 1708.5 (31.1)    Urine (mL/kg/hr) 1075 (0.8)    Stool 100 (0.1)    Total Output 1175     Net +533.5            PHYSICAL EXAMINATION: General:  Opens eyes , tracks eyes, perrl, not following commands Neuro:  Not following commands, hyperreflexic but no clonus Agitated on low dose precedex HEENT:  PERRL, Increased jvd Cardiovascular:  Tachycardia, RR, no m/r/g/S3/S4 Lungs:  BS distant Abdomen:  Colostomy with brown stool drainage, no r/g Musculoskeletal:  Deconditioned  Skin:  Multiple small non-draining abrasions  LABS:  Recent Labs Lab 02/05/13 1858 02/05/13 1931  02/06/13 0408 02/06/13 0500 02/06/13 0930  02/06/13 1034  02/07/13 0442 02/07/13 0500 02/08/13 0305 02/08/13 0500 02/09/13 0330 02/09/13 0447 02/10/13 0355  HGB 14.3  --   --   --  12.5*  --   --   --   --   --  10.4*  --  10.1* 9.9*  --  10.2*  WBC 25.5*  --   --   --  18.2*  --   --   --   --   --  14.0*  --  14.9* 10.9*  --  10.7*  PLT 279  --   --   --  252  --   --   --   --   --  224  --  227 202  --  200  NA 120*  --   < >  --  130*  --   < >  --   < >  --  132*  --  137 139  --  145  K 3.8  --   < >  --  3.3*  --   < >  --   < >  --  4.1  --  4.6 4.1  --  3.7  CL 80*  --   < >  --  91*  --   < >  --   < >  --  99  --  103 106  --  109  CO2 19  --   < >  --  23  --   < >  --   < >  --  24  --  28 29  --  31  GLUCOSE 88  --   < >  --  145*  --   < >  --   < >  --  154*  --  132* 105*  --  99  BUN 19  --   < >  --  20  --   < >  --   < >  --  23  --  26* 28*  --  27*  CREATININE 1.53*  --   < >  --  1.58*  --   < >  --   < >  --  1.64*  --  1.50* 1.31  --  1.14  CALCIUM 9.7  --   < >  --  7.7*  --   < >  --   < >  --  7.7*  --  8.3* 8.6  --  8.9  MG  --   --   --   --   --   --   --   --   --   --   --   --   --  2.4  --   --   PHOS  --   --   --   --   --   --   --   --   --   --   --   --   --  2.8  --   --   AST 252*  --   --   --   --  249*   --   --   --   --  282*  --  354* 324*  --   --   ALT 50  --   --   --   --  47  --   --   --   --  52  --  73* 82*  --   --   ALKPHOS 77  --   --   --   --  49  --   --   --   --  44  --  46 34*  --   --   BILITOT 0.9  --   --   --   --  0.4  --   --   --   --  0.3  --  0.2* 0.3  --   --   PROT 7.9  --   --   --   --  5.7*  --   --   --   --  5.7*  --  5.8* 5.4*  --   --   ALBUMIN 4.5  --   --   --   --  3.0*  --   --   --   --  2.9*  --  2.8* 2.7*  --   --   INR  --   --   --   --   --  1.10  --   --   --   --   --   --   --   --   --   --   LATICACIDVEN  --  2.82*  --   --   --   --   --   --   --   --   --   --   --   --   --   --   TROPONINI  --   --   --   --   --   --   --  0.87*  --   --   --   --   --   --   --   --   PROCALCITON  --   --   --   --  3.68  --   --   --   --   --   --   --   --   --   --   --   PHART  --   --   < > 7.302*  --   --   --   --   < > 7.417  --  7.298*  --   --  7.397  --   PCO2ART  --   --   < > 43.4  --   --   --   --   < > 34.1*  --  55.6*  --   --  44.2  --   PO2ART  --   --   < > 121.0*  --   --   --   --   < > 73.0*  --  78.7*  --   --  93.0  --   < > = values in this interval not displayed.  Recent Labs Lab 02/09/13 1219 02/09/13 1519 02/09/13 1924 02/09/13 2338 02/10/13 0347  GLUCAP 134* 92 129* 133* 97    4/18 CXR: big lungs, ETT and Left IJ in place 4/20 rt hilar infiltate? Vs chronic, ett wnl 4/21: emphysema with mild vascular congestion. N o focal disease  ASSESSMENT / PLAN: Principal Problem:   Cardiac arrest Active Problems:   COPD (chronic obstructive pulmonary disease)   Severe protein-calorie malnutrition   Altered mental status   Anoxic encephalopathy   Respiratory failure, acute and chronic   PULMONARY A: Severe emphysema with bullae; COPD      VDRF- emergent intubation following cardiac arrest R/o asp P:   -Duonebs  q4h -IV solumedrol- 40 mg BID -He was attempted sbt cpap5 ps 5, goal 2 hrs>>failed and returned to  vent 4/22.      CARDIOVASCULAR A:  Hx of HTN  Cardiac arrest with 7 min of CPR on 4/18 Shock, post arrest -resolved Tachycardia and HTN  Likely 2/2 alcohol withdrawl P:  Slight positive balance with I/O  Tachycardia and HTN -Would start him on precedex and  d/c fentanyl and versed(except prn dosing) . Also , add low dose bisoprolol.    RENAL A:  ARF - FeNa 0.2%, likely prerenal- Creatinine at baseline( resolved) Hyponatremia on admission  likely beer protomania- hypotonic (serum osmol 263)- Resolved P:   KVO IVF   GASTROINTESTINAL A:  emergent Hartmann's procedure 3/13 2/2 SBO with colostomy non-revised by surgery 2/2 poor nutrition and overall medical status - CT abdomen with no intraabdominal process   Elevated Transaminases -AST elevated likely 2/2 alcohol - improving -4/21 P:   -PPI -continue TF   HEMATOLOGIC A:  Anemia, normocytic. No signs of bleeding, Hg stable, anemia of chronic disease?  Leucocytosis - trending down, no signs of infection, plts stable P:  -sub q heparin  INFECTIOUS HIV - neg C. Diff stool PCR - negative Blood cultures( 4/17)- NGTD x2 A:  R/o aspiration, concern rt hilum. Leucocytosis stable.  P:   -  Unasyn  To end 4/23  ENDOCRINE A:  No active issues P:   -SSI  NEUROLOGIC  A:  AMS( likely delirium) from EtOH withdrawal and possible anoxic brain injury ( s/p cardiac arrest) that can not be ruled out.  CT head was negative.  MRI ( 4/22)- Negative for acute infract  EEG- slowing but no focal disease, seizure seems less likely P:   -Neuro following along  - Keppra for ? possible seizure on EEG - Limit sedation - Start precedex for alcohol withdrawal ( supported by tachycardia and HTN) that could be worsening his delirium - Continue IVThiamine and folic acid daily  TODAY'S SUMMARY: 57 yo WM with possible alcohol withdrawal and hypoxic CNS with hyperreflexia on exam . MRI negative for acute infarct on 4/21.   Pt still VDRF. Started him on  precedex for alcohol withdrawal on 4/22.   I updated son and brother at bedside this am. Son still has questions re ED stay. I reassured son no evidence of anoxic cns damage, only ETOH WD   I have personally obtained a history, examined the patient, evaluated laboratory and imaging results, formulated the assessment and plan and placed orders.  CRITICAL CARE: The patient is critically ill with multiple organ systems failure and requires high complexity decision making for assessment and support, frequent evaluation and titration of therapies, application of advanced monitoring technologies and extensive interpretation of multiple databases. Critical Care Time devoted to patient care services described in this note is 30 minutes.   SAWHNEY,MEGHA 02/10/2013 . Dorcas Carrow Beeper  [phone]  Cell  [phone]  If no response or cell goes to voicemail, call beeper [phone]

## 2013-02-10 NOTE — Progress Notes (Signed)
Subjective: No signs of strkoe on MRI.   Exam: Filed Vitals:   02/10/13 0816  BP: 172/94  Pulse: 141  Temp:   Resp: 12   Gen: In bed, NAD MS: Awake, alert, engages examiner. Does not follow commands.  ZO:XWRUE, EOMI Motor: continues to move all extremities spontaneously,  Sensory:responds to nox stim x 4 AVW:[id number] appears improved today, 3+ in the legs and arms, but no sustained clonus.    Impression: 57 yo M with cardiac arrest following alterd mental status. At this time, his clinical exam appears most consistent with aphasia, but no findings on MRI or previous overnight EEG to support a cause for this. At this time, I suspect tha this current state represents delirium, liekly at least in part due to EtOH withdrawel, though his clinical picture has never clearly fit the typical symptoms seen with DT.   Recommendations: 1) Continue supportive care, after extubation, clinical exam may become more clear.  2)  Would continue keppra while workup continues, but do not think that he will need it long term. The EEG was not diagnositic of seizure due to artifact, but the video did appear to be concerning for seizure. Even if it does represent seizure, likely EtOH withdrawel related.   Ritta Slot, MD [facility] [phone]  If 7pm- 7am, please page neurology on call at [phone].

## 2013-02-11 ENCOUNTER — Inpatient Hospital Stay (HOSPITAL_COMMUNITY): Payer: Medicaid Other

## 2013-02-11 LAB — GLUCOSE, CAPILLARY
Glucose-Capillary: 170 mg/dL — ABNORMAL HIGH (ref 70–99)
Glucose-Capillary: 132 mg/dL — ABNORMAL HIGH (ref 70–99)
Glucose-Capillary: 114 mg/dL — ABNORMAL HIGH (ref 70–99)
Glucose-Capillary: 125 mg/dL — ABNORMAL HIGH (ref 70–99)

## 2013-02-11 LAB — URINALYSIS, ROUTINE W REFLEX MICROSCOPIC
Bilirubin Urine: NEGATIVE
Ketones, ur: NEGATIVE mg/dL
Nitrite: NEGATIVE
Protein, ur: 30 mg/dL — AB
pH: 6 (ref 5.0–8.0)

## 2013-02-11 LAB — POCT I-STAT 3, ART BLOOD GAS (G3+)
Bicarbonate: 28.9 mEq/L — ABNORMAL HIGH (ref 20.0–24.0)
TCO2: 30 mmol/L (ref 0–100)
pH, Arterial: 7.517 — ABNORMAL HIGH (ref 7.350–7.450)
pO2, Arterial: 91 mmHg (ref 80.0–100.0)

## 2013-02-11 LAB — CBC
MCHC: 35 g/dL (ref 30.0–36.0)
Platelets: 210 10*3/uL (ref 150–400)
RDW: 14.6 % (ref 11.5–15.5)
WBC: 9.9 10*3/uL (ref 4.0–10.5)

## 2013-02-11 LAB — BASIC METABOLIC PANEL
BUN: 30 mg/dL — ABNORMAL HIGH (ref 6–23)
Chloride: 111 mEq/L (ref 96–112)
GFR calc Af Amer: 89 mL/min — ABNORMAL LOW (ref >90)
GFR calc non Af Amer: 77 mL/min — ABNORMAL LOW (ref >90)
Potassium: 3.5 mEq/L (ref 3.5–5.1)
Sodium: 147 mEq/L — ABNORMAL HIGH (ref 135–145)

## 2013-02-11 LAB — URINE MICROSCOPIC-ADD ON

## 2013-02-11 MED ORDER — VECURONIUM BROMIDE 10 MG IV SOLR
INTRAVENOUS | Status: AC
  Administered 2013-02-11: 7 mg
  Filled 2013-02-11: qty 10

## 2013-02-11 MED ORDER — VITAMIN B-1 100 MG PO TABS
100.0000 mg | ORAL_TABLET | Freq: Every day | ORAL | Status: DC
Start: 2013-02-11 — End: 2013-02-23
  Administered 2013-02-11 – 2013-02-23 (×12): 100 mg via ORAL
  Filled 2013-02-11 (×13): qty 1

## 2013-02-11 MED ORDER — PROPOFOL 10 MG/ML IV EMUL
5.0000 ug/kg/min | Freq: Once | INTRAVENOUS | Status: DC
  Filled 2013-02-11: qty 100

## 2013-02-11 MED ORDER — MIDAZOLAM HCL 2 MG/2ML IJ SOLN
2.0000 mg | Freq: Once | INTRAMUSCULAR | Status: AC
  Administered 2013-02-11: 2 mg via INTRAVENOUS

## 2013-02-11 MED ORDER — BISOPROLOL FUMARATE 10 MG PO TABS
20.0000 mg | ORAL_TABLET | Freq: Every day | ORAL | Status: DC
  Administered 2013-02-12 – 2013-02-21 (×9): 20 mg via ORAL
  Filled 2013-02-11 (×10): qty 2

## 2013-02-11 MED ORDER — FENTANYL CITRATE 0.05 MG/ML IJ SOLN
100.0000 ug | Freq: Once | INTRAMUSCULAR | Status: AC
  Administered 2013-02-11: 100 ug via INTRAVENOUS

## 2013-02-11 MED ORDER — VECURONIUM BROMIDE 10 MG IV SOLR
10.0000 mg | Freq: Once | INTRAVENOUS | Status: DC
  Filled 2013-02-11: qty 10

## 2013-02-11 MED ORDER — PREDNISONE 5 MG/5ML PO SOLN
20.0000 mg | Freq: Every day | ORAL | Status: DC
  Administered 2013-02-11 – 2013-02-20 (×8): 20 mg
  Filled 2013-02-11 (×11): qty 20

## 2013-02-11 MED ORDER — VECURONIUM BROMIDE 10 MG IV SOLR
7.0000 mg | Freq: Once | INTRAVENOUS | Status: AC
  Filled 2013-02-11: qty 10

## 2013-02-11 MED ORDER — ETOMIDATE 2 MG/ML IV SOLN
INTRAVENOUS | Status: AC
  Filled 2013-02-11: qty 10

## 2013-02-11 MED ORDER — MIDAZOLAM BOLUS VIA INFUSION
2.0000 mg | Freq: Once | INTRAVENOUS | Status: AC
  Filled 2013-02-11: qty 2

## 2013-02-11 MED ORDER — ETOMIDATE 2 MG/ML IV SOLN
40.0000 mg | Freq: Once | INTRAVENOUS | Status: DC
  Filled 2013-02-11: qty 20

## 2013-02-11 MED ORDER — MIDAZOLAM HCL 2 MG/2ML IJ SOLN: 4.0000 mg | Freq: Once | INTRAMUSCULAR | Status: DC

## 2013-02-11 MED ORDER — FENTANYL CITRATE 0.05 MG/ML IJ SOLN: 200.0000 ug | Freq: Once | INTRAMUSCULAR | Status: DC

## 2013-02-11 MED ORDER — ETOMIDATE 2 MG/ML IV SOLN
10.0000 mg | Freq: Once | INTRAVENOUS | Status: AC
  Administered 2013-02-11: 10 mg via INTRAVENOUS

## 2013-02-11 MED ORDER — FENTANYL CITRATE 0.05 MG/ML IJ SOLN: 50.0000 ug | INTRAMUSCULAR | Status: DC | PRN

## 2013-02-11 MED ORDER — MIDAZOLAM HCL 2 MG/2ML IJ SOLN
INTRAMUSCULAR | Status: AC
  Administered 2013-02-11: 2 mg
  Filled 2013-02-11: qty 4

## 2013-02-11 NOTE — Progress Notes (Signed)
PCCM  Pt developed progressive inability to protect airway and thus had to be reintubated by Dr Molli Knock.  Dorcas Carrow Beeper  [phone]  Cell  [phone]  If no response or cell goes to voicemail, call beeper [phone]

## 2013-02-11 NOTE — Procedures (Signed)
Extubation Procedure Note  Patient Details:   Name: Zachary Walls DOB: 05-26-1956 MRN: [id number]   Airway Documentation:     Evaluation  O2 sats: stable throughout Complications: No apparent complications Patient did tolerate procedure well. Bilateral Breath Sounds: Rhonchi;Diminished Suctioning: Airway No: not yet but trying to  Gwendolyn Grant 02/11/2013, 12:59 PM
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
## 2013-02-11 NOTE — Progress Notes (Signed)
NEURO HOSPITALIST PROGRESS NOTE   SUBJECTIVE:                                                                                                                        No new neurological developments. He is alert and awake but doesn't follows commands. MRI brain 4/21 showed no abnormalities. On keppra.  OBJECTIVE:                                                                                                                           Vital signs in last 24 hours: Temp:  [98.7 F (37.1 C)-99.6 F (37.6 C)] 98.7 F (37.1 C) (04/23 0436) Pulse Rate:  [52-141] 73 (04/23 0700) Resp:  [9-42] 12 (04/23 0700) BP: (106-190)/(67-131) 137/84 mmHg (04/23 0700) SpO2:  [96 %-100 %] 99 % (04/23 0700) FiO2 (%):  [40 %] 40 % (04/23 0700)  Intake/Output from previous day: 04/22 0701 - 04/23 0700 In: 2158.1 [I.V.:1001.1; NG/GT:1055; IV Piggyback:102] Out: 1350 [Urine:1220; Emesis/NG output:20; Stool:110] Intake/Output this shift:   Nutritional status:    Past Medical History  Diagnosis Date  . COPD (chronic obstructive pulmonary disease)   . Pneumonia   . Hypertension   . HOH (hard of hearing) 12/01/2011     Neurologic Exam:  MS: alert and awake but doesn't follows commands. CN 2-12: intact. Motor: moves all limbs symmetrically. Sensory: no tested. Coordination and gait: no tested. DTR's: generalized hyperreflexia without clonus. Plantars: downgoing.  Lab Results: Lab Results  Component Value Date/Time   CHOL 79 12/17/2011  5:38 AM   Lipid Panel No results found for this basename: CHOL, TRIG, HDL, CHOLHDL, VLDL, LDLCALC,  in the last 72 hours  Studies/Results: Mr Brain Wo Contrast  02/10/2013  *RADIOLOGY REPORT*  Clinical Data: Hyperreflexia.  Rule out stroke  MRI HEAD WITHOUT CONTRAST  Technique:  Multiplanar, multiecho pulse sequences of the brain and surrounding structures were obtained according to standard protocol without intravenous  contrast.  Comparison: CT 02/05/2013  Findings: Negative for acute infarct.  No significant chronic ischemia.  Cerebral white matter and brainstem are intact.  Negative for hemorrhage or fluid collection.  Negative for mass or edema.  Ventricle size is normal and there is no midline shift.  Mastoid  sinus effusion bilaterally.  Mild mucosal edema left maxillary sinus.  IMPRESSION: No acute intracranial abnormality.  Sinusitis.   Original Report Authenticated By: Janeece Riggers, M.D.     MEDICATIONS                                                                                                                       I have reviewed the patient's current medications.  ASSESSMENT/PLAN:                                                                                                           Hypoactive delirium?. Doubt partial complex SE but agree with maintaining keppra for now. Will follow up.  Wyatt Portela, MD [facility] [phone]  02/11/2013, 8:11 AM

## 2013-02-11 NOTE — Progress Notes (Signed)
RN Progress Note Day Shift (Late Note)  1259 Patient extubated by RT per Dr. Lynelle Doctor order post ABG and bedside assessment. Patient alert. RN at bedside. Oral care performed, patient inline suctioned, mouth suctioned. RT pulled ETT. Patient orally suctioned. Patient did not cough despite deep suctioning attempts. Patient verbalized repeatedly " Relax". Son at the bedside during procedure. VS stable throughout. RN closely monitoring patient's respiratory status. Will continue to monitor and encourage coughing/deep breathing. Patient positioned upright in bed.   1330 RN encouraging patient to cough. Patient will not follow commands. Minimal oral secretions, but audible in throat. RN suctioned patient orally. Patient would not cough. Deep suctioning did not elicit a cough.   1400 RN at bedside to reposition patient and reassess respiratory status. Patient breath sounds course with audible gurgling of secretions. RN encouraged coughing with no patient response. RN discussed concern for aspiration with resident Dr. Burtis Junes. Nasal suctioning ordered and performed by RN. Patient repositioned upright in bed.   1430 Dr. Molli Knock paged to bedside. Patient alert with visible respiratory distress. Accessory muscles and labored breathing witnessed by RN at bedside. RT called to bedside.   1445 Patient VSS. Induction for intubation with versed, fentanyl, etomidate per Dr. Molli Knock. RN witnessed visible chest rise with bag mask ventilation. Vecuronium administered per MD. Patient intubated by Dr. Molli Knock. VSS throughout procedure. Dr. Delford Field assessed patient at bedside and immediately called and updated patient's family. RN at beside to continue to assess.   1530 RN discussed reintubation with patient's family at the bedside. RN offered support. All questions answered.   Plan for tracheostomy at bedside tomorrow morning.   1900 Bedside report given to Elisha Headland. RN updated about todays events.

## 2013-02-11 NOTE — Procedures (Signed)
Intubation Procedure Note MALIK PAAR [id number] February 10, 1956  Procedure: Intubation Indications: Airway protection and maintenance  Procedure Details Consent: Unable to obtain consent because of emergent medical necessity. Time Out: Verified patient identification, verified procedure, site/side was marked, verified correct patient position, special equipment/implants available, medications/allergies/relevent history reviewed, required imaging and test results available.  Performed  Maximum sterile technique was used including gloves, hand hygiene and mask.  MAC    Evaluation Hemodynamic Status: BP stable throughout; O2 sats: stable throughout Patient's Current Condition: stable Complications: No apparent complications Patient did tolerate procedure well. Chest X-ray ordered to verify placement.  CXR: pending.   Gypsy Lore PA-S 02/11/2013  Patient seen and examined, agree with above note.  I dictated the care and orders written for this patient under my direction.  Alyson Reedy, MD [phone]

## 2013-02-12 ENCOUNTER — Inpatient Hospital Stay (HOSPITAL_COMMUNITY): Payer: Medicaid Other

## 2013-02-12 LAB — CULTURE, BLOOD (ROUTINE X 2): Culture: NO GROWTH

## 2013-02-12 LAB — PROTIME-INR
INR: 1.05 (ref 0.00–1.49)
Prothrombin Time: 13.6 seconds (ref 11.6–15.2)

## 2013-02-12 LAB — CBC
MCH: 30.9 pg (ref 26.0–34.0)
MCHC: 34.7 g/dL (ref 30.0–36.0)
Platelets: 199 10*3/uL (ref 150–400)
RDW: 14.6 % (ref 11.5–15.5)

## 2013-02-12 LAB — APTT: aPTT: 28 seconds (ref 24–37)

## 2013-02-12 LAB — BASIC METABOLIC PANEL
Calcium: 9.2 mg/dL (ref 8.4–10.5)
GFR calc Af Amer: 80 mL/min — ABNORMAL LOW (ref >90)
GFR calc non Af Amer: 69 mL/min — ABNORMAL LOW (ref >90)
Glucose, Bld: 102 mg/dL — ABNORMAL HIGH (ref 70–99)
Sodium: 151 mEq/L — ABNORMAL HIGH (ref 135–145)

## 2013-02-12 LAB — GLUCOSE, CAPILLARY
Glucose-Capillary: 100 mg/dL — ABNORMAL HIGH (ref 70–99)
Glucose-Capillary: 120 mg/dL — ABNORMAL HIGH (ref 70–99)

## 2013-02-12 MED ORDER — FENTANYL CITRATE 0.05 MG/ML IJ SOLN
25.0000 ug | INTRAMUSCULAR | Status: DC | PRN
  Filled 2013-02-12: qty 2

## 2013-02-12 MED ORDER — FENTANYL CITRATE 0.05 MG/ML IJ SOLN
200.0000 ug | INTRAMUSCULAR | Status: DC | PRN
  Administered 2013-02-12: 100 ug via INTRAVENOUS

## 2013-02-12 MED ORDER — POTASSIUM CHLORIDE 10 MEQ/50ML IV SOLN
10.0000 meq | INTRAVENOUS | Status: AC
  Administered 2013-02-12 (×4): 10 meq via INTRAVENOUS
  Filled 2013-02-12: qty 200

## 2013-02-12 MED ORDER — FENTANYL CITRATE 0.05 MG/ML IJ SOLN
INTRAMUSCULAR | Status: AC
  Administered 2013-02-12: 100 ug
  Filled 2013-02-12: qty 4

## 2013-02-12 MED ORDER — PROPOFOL 10 MG/ML IV EMUL
5.0000 ug/kg/min | INTRAVENOUS | Status: DC
  Administered 2013-02-12: 35 ug/kg/min via INTRAVENOUS
  Administered 2013-02-13: 30 ug/kg/min via INTRAVENOUS

## 2013-02-12 MED ORDER — MIDAZOLAM HCL 2 MG/2ML IJ SOLN
INTRAMUSCULAR | Status: AC
  Filled 2013-02-12: qty 4

## 2013-02-12 MED ORDER — MIDAZOLAM HCL 2 MG/2ML IJ SOLN
6.0000 mg | Freq: Once | INTRAMUSCULAR | Status: AC
  Administered 2013-02-12: 4 mg via INTRAVENOUS

## 2013-02-12 MED ORDER — PROPOFOL 10 MG/ML IV EMUL
INTRAVENOUS | Status: AC
  Filled 2013-02-12: qty 100

## 2013-02-12 MED ORDER — LEVETIRACETAM 500 MG PO TABS
500.0000 mg | ORAL_TABLET | Freq: Two times a day (BID) | ORAL | Status: DC
  Administered 2013-02-13 – 2013-02-14 (×3): 500 mg via ORAL
  Filled 2013-02-12 (×5): qty 1

## 2013-02-12 MED ORDER — VECURONIUM BROMIDE 10 MG IV SOLR
8.0000 mg | Freq: Once | INTRAVENOUS | Status: AC
Start: 2013-02-12 — End: 2013-02-12
  Administered 2013-02-12: 8 mg via INTRAVENOUS
  Filled 2013-02-12: qty 10

## 2013-02-12 MED ORDER — MIDAZOLAM HCL 2 MG/2ML IJ SOLN
INTRAMUSCULAR | Status: AC
  Filled 2013-02-12: qty 6

## 2013-02-12 MED ORDER — ETOMIDATE 2 MG/ML IV SOLN
20.0000 mg | Freq: Once | INTRAVENOUS | Status: AC
  Administered 2013-02-12: 20 mg via INTRAVENOUS

## 2013-02-12 NOTE — Procedures (Signed)
Bronchoscopy  for Percutaneous  Tracheostomy  Name: TYREEK CLABO MRN: [id number] DOB: April 05, 1956 Procedure: Bronchoscopy for Percutaneous Tracheostomy Indications: Diagnostic evaluation of the airways In conjunction with: Dr. Molli Knock  Procedure Details Consent: Risks of procedure as well as the alternatives and risks of each were explained to the (patient/caregiver).  Consent for procedure obtained. Time Out: Verified patient identification, verified procedure, site/side was marked, verified correct patient position, special equipment/implants available, medications/allergies/relevent history reviewed, required imaging and test results available.  Performed  In preparation for procedure, patient was given 100% FiO2. Sedation: Benzodiazepines, Muscle relaxants and Etomidate  Airway entered and the following bronchi were examined: RML and LLL.   Procedures performed: Endotracheal Tube retracted in 2 cm increments. Cannulation of airway observed. Dilation observed. Placement of trachel tube  observed . No overt complications. Bronchoscope removed.  , Patient placed back on 100% FiO2 at conclusion of procedure.    Evaluation Hemodynamic Status: BP stable throughout; O2 sats: stable throughout Patient's Current Condition: stable Specimens:  None Complications: No apparent complications Patient did tolerate procedure well.   Brett Canales Minor ACNP [facility] Pollack PCCM Pager [phone] till 3 pm If no answer page [phone] 02/12/2013, 12:25 PM  I was present for this procedure. Dorcas Carrow Beeper  [phone]  Cell  [phone]  If no response or cell goes to voicemail, call beeper [phone]

## 2013-02-12 NOTE — Progress Notes (Signed)
02/12/13 2230  Pt extremely agitated attempting to exit bed multiple times. Pt continuously popping off the ventilator. Pt Water engineer at bedside. Pt has received of Fentanyl and 1mg  of Ativan via ng tube. Pt on 1.59mcg Precedex gtt and still climbing out of bed. Pt tossing and turning, ruptured colostomy bag, and disconnected ng tube and tubefeeding. eLink MD and residents called. eLink MD viewed pt and decided to switch pt sedation from Precedex gtt to Propofol gtt.   Will continue to monitor pt  Elisha Headland RN

## 2013-02-12 NOTE — Procedures (Signed)
Bedside Tracheostomy Insertion Procedure Note   Patient Details:   Name: CHISTIAN KASLER DOB: November 28, 1955 MRN: [id number]  Procedure: Tracheostomy  Pre Procedure Assessment Bite block in place: Yes Breath Sounds: Diminished  Post Procedure Assessment: BP 123/76  Pulse 69  Temp(Src) 98.4 F (36.9 C) (Oral)  Resp 12  Ht 5' 6.93" (1.7 m)  Wt 121 lb 0.5 oz (54.9 kg)  BMI 19 kg/m2  SpO2 100% O2 sats: stable throughout Complications: No apparent complications Patient did tolerate procedure well Tracheostomy Brand:Shiley Tracheostomy Style:Cuffed Tracheostomy Size: 6.0 Tracheostomy Secured NWG:[id number] Tracheostomy Placement Confirmation:Trach cuff visualized and in place and Chest X ray ordered for placement    Kendall Flack Royal 02/12/2013, 12:54 PM

## 2013-02-12 NOTE — Procedures (Signed)
Percutaneous Tracheostomy Placement   Consent from son, patient sedated and paralyzed, area cleaned, lidocaine/epi injected, skin incision done followed by blunt dissection, airway entered and visualized bronchoscopically, airway crushed and dilated, tracheostomy placed and wire removed. Visualized bronchoscopically well above carina. CXR ordered and pending. Patient tolerated and procedure well without complications. Size 6 cuffed tracheostomy placed.   Tyquisha Sharps G. Miabella Shannahan, M.D.  [facility].  Pager: [phone].  After hours pager: [phone].  

## 2013-02-12 NOTE — Progress Notes (Signed)
[facility] ADULT ICU REPLACEMENT PROTOCOL FOR AM LAB REPLACEMENT ONLY  The patient does apply for the [facility] Adult ICU Electrolyte Replacment Protocol based on the criteria listed below:   1. Is GFR >/= 40 ml/min? yes  Patient's GFR today is 80 2. Is urine output >/= 0.5 ml/kg/hr for the last 6 hours? yes Patient's UOP is 0.61 ml/kg/hr 3. Is BUN < 60 mg/dL? yes  Patient's BUN today is 29 4. Abnormal electrolyte(s):K 3.1 5. Ordered repletion with: per protocol 6. If a panic level lab has been reported, has the CCM MD in charge been notified? yes.   Physician:  Dr Bea Laura Deterding  [place] 02/12/2013 5:31 AM

## 2013-02-12 NOTE — Progress Notes (Addendum)
PULMONARY  / CRITICAL CARE MEDICINE  Name: Zachary Walls MRN: [id number] DOB: 02-Jan-1956    ADMISSION DATE:  02/05/2013 CONSULTATION DATE:  40981  REFERRING MD :  Dr. Ignacia Palma PRIMARY SERVICE: PCCM  CHIEF COMPLAINT:  AMS  BRIEF PATIENT DESCRIPTION: Pt was last seen normal 4/16 at 4pm. Son found pt down in fetal position complaining of breathing. Pt had suspicious visitor that may have given pt drugs. 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 Pt continues to be altered and PCCM asked to manage.   SIGNIFICANT EVENTS / STUDIES:  4/17- AMS, admitted to ICU 4/18 - cardiac arrest with 7 minutes of CPR, emergent intubation, on pressor 4/18- eeg -slow, no focus 4/18 echo - 65%, WNL, euvolemic 4/19- clinical findings concerning seizures, keppra, more alert 4/22 MRI brain: Negative for acute infract. No acute intracranial abnormality 4/24 pt failed extubation unable to protect airway reintubated  LINES / TUBES: 4/18 ETT>>>4/24>>4/24 4/18 LIJ>> 4/18 Foley>>  CULTURES: 4/17 BCx>>>neg  4/17 resp Cx>>neg 4/17 UCx>>neg 4/18 cdiff>>>neg  ANTIBIOTICS: 4/17 IV zosyn>>>4/20 4/18 unasyn>>>  4/23 4/17 Vanc >>>4/20  SUBJECTIVE: failed extubation yesterday needed to be reintubated unable to protect airway. Continues on precedex. Limited change in neuro status.   VITAL SIGNS: Temp:  [98.3 F (36.8 C)-99.3 F (37.4 C)] 98.4 F (36.9 C) (04/24 0400) Pulse Rate:  [67-121] 67 (04/24 0600) Resp:  [7-24] 12 (04/24 0600) BP: (116-266)/(68-104) 146/80 mmHg (04/24 0600) SpO2:  [91 %-100 %] 99 % (04/24 0600) FiO2 (%):  [40 %] 40 % (04/24 0600) HEMODYNAMICS: CV stable   VENTILATOR SETTINGS: Vent Mode:  [-] PCV FiO2 (%):  [40 %] 40 % Set Rate:  [12 bmp] 12 bmp PEEP:  [5 cmH20] 5 cmH20 Pressure Support:  [5 cmH20] 5 cmH20 Plateau Pressure:  [17 cmH20-25 cmH20] 17 cmH20 INTAKE / OUTPUT: Intake/Output     04/23 0701 - 04/24 0700 04/24 0701 - 04/25 0700   I.V. (mL/kg)  739.3 (13.5)    NG/GT 685    IV Piggyback 315    Total Intake(mL/kg) 1739.3 (31.7)    Urine (mL/kg/hr) 1980 (1.5)    Emesis/NG output     Stool 100 (0.1)    Total Output 2080     Net -340.7            PHYSICAL EXAMINATION: General:  Opens eyes , tracks eyes, perrl, not following commands Neuro:  Not following commands, hyperreflexic but no clonus, slight asterxisis, non purposeful movements HEENT:  PERRL, Increased jvd Cardiovascular:  Tachycardia, RR, no m/r/g/S3/S4 Lungs:  BS distant Abdomen:  Colostomy with brown stool drainage, no r/g Musculoskeletal:  Deconditioned  Skin:  Multiple small non-draining abrasions  LABS:  Recent Labs Lab 02/05/13 1858 02/05/13 1931  02/06/13 0408 02/06/13 0500 02/06/13 0930  02/06/13 1034  02/07/13 0500 02/08/13 0305 02/08/13 0500 02/09/13 0330 02/09/13 0447 02/10/13 0355 02/11/13 0401 02/11/13 1714 02/12/13 0400  HGB 14.3  --   --   --  12.5*  --   --   --   --  10.4*  --  10.1* 9.9*  --  10.2* 10.7*  --  10.7*  WBC 25.5*  --   --   --  18.2*  --   --   --   --  14.0*  --  14.9* 10.9*  --  10.7* 9.9  --  10.0  PLT 279  --   --   --  252  --   --   --   --  224  --  227 202  --  200 210  --  199  NA 120*  --   < >  --  130*  --   < >  --   < > 132*  --  137 139  --  145 147*  --  151*  K 3.8  --   < >  --  3.3*  --   < >  --   < > 4.1  --  4.6 4.1  --  3.7 3.5  --  3.1*  CL 80*  --   < >  --  91*  --   < >  --   < > 99  --  103 106  --  109 111  --  112  CO2 19  --   < >  --  23  --   < >  --   < > 24  --  28 29  --  31 30  --  32  GLUCOSE 88  --   < >  --  145*  --   < >  --   < > 154*  --  132* 105*  --  99 142*  --  102*  BUN 19  --   < >  --  20  --   < >  --   < > 23  --  26* 28*  --  27* 30*  --  29*  CREATININE 1.53*  --   < >  --  1.58*  --   < >  --   < > 1.64*  --  1.50* 1.31  --  1.14 1.06  --  1.15  CALCIUM 9.7  --   < >  --  7.7*  --   < >  --   < > 7.7*  --  8.3* 8.6  --  8.9 8.8  --  9.2  MG  --   --   --   --   --    --   --   --   --   --   --   --  2.4  --   --   --   --   --   PHOS  --   --   --   --   --   --   --   --   --   --   --   --  2.8  --   --   --   --   --   AST 252*  --   --   --   --  249*  --   --   --  282*  --  354* 324*  --   --   --   --   --   ALT 50  --   --   --   --  47  --   --   --  52  --  73* 82*  --   --   --   --   --   ALKPHOS 77  --   --   --   --  49  --   --   --  44  --  46 34*  --   --   --   --   --   BILITOT 0.9  --   --   --   --  0.4  --   --   --  0.3  --  0.2* 0.3  --   --   --   --   --   PROT 7.9  --   --   --   --  5.7*  --   --   --  5.7*  --  5.8* 5.4*  --   --   --   --   --   ALBUMIN 4.5  --   --   --   --  3.0*  --   --   --  2.9*  --  2.8* 2.7*  --   --   --   --   --   APTT  --   --   --   --   --   --   --   --   --   --   --   --   --   --   --   --   --  28  INR  --   --   --   --   --  1.10  --   --   --   --   --   --   --   --   --   --   --  1.05  LATICACIDVEN  --  2.82*  --   --   --   --   --   --   --   --   --   --   --   --   --   --   --   --   TROPONINI  --   --   --   --   --   --   --  0.87*  --   --   --   --   --   --   --   --   --   --   PROCALCITON  --   --   --   --  3.68  --   --   --   --   --   --   --   --   --   --   --   --   --   PHART  --   --   < > 7.302*  --   --   --   --   < >  --  7.298*  --   --  7.397  --   --  7.517*  --   PCO2ART  --   --   < > 43.4  --   --   --   --   < >  --  55.6*  --   --  44.2  --   --  35.7  --   PO2ART  --   --   < > 121.0*  --   --   --   --   < >  --  78.7*  --   --  93.0  --   --  91.0  --   < > = values in this interval not displayed.  Recent Labs Lab 02/11/13 1104 02/11/13 1641 02/11/13 2046 02/12/13 0015 02/12/13 0341  GLUCAP 132* 114* 125* 120* 100*    4/18 CXR: big lungs, ETT and Left IJ in place 4/20 rt hilar infiltate? Vs chronic, ett wnl 4/21: emphysema with mild vascular congestion. N o focal disease 4/24: hyperinflation, flattened diaphragm, ?pulm congestion  R>L  ASSESSMENT / PLAN:  PULMONARY A: Severe emphysema with bullae; COPD      VDRF- emergent intubation following cardiac arrest R/o asp was treated 5 days of unasyn>> 4/24 P:   -Duonebs  q4h -d/c solumedrol 4/23 -oral prednisone 40mg  daily -cont sbt cpap5 ps 5, goal 2 hrs -trach 4/24  CARDIOVASCULAR A:  Hx of HTN  Cardiac arrest with 7 min of CPR on 4/18 Shock, post arrest -resolved Tachycardia and HTN  Likely 2/2 alcohol withdrawal improving on precedex P:  -cont precedex -prn fentanyl  -all versed d/c on 4/24 -cont bisoprolol    RENAL A:  ARF - FeNa 0.2%, likely prerenal- Creatinine at baseline( resolved) Hyponatremia on admission likely beer protomania- hypotonic (serum osmol 263)- Resolved Hypokalemia P:   -KVO IVF - Kcl x 5  GASTROINTESTINAL A:  emergent Hartmann's procedure 3/13 2/2 SBO with colostomy non-revised by surgery 2/2 poor nutrition and overall medical status - CT abdomen with no intraabdominal process   Elevated Transaminases -AST elevated likely 2/2 alcohol - improving -4/21 P:   -PPI -continue TF   HEMATOLOGIC A:  Microcytic Anemia, normocytic. No signs of bleeding, Hg stable, anemia of chronic disease?  INR, plts stable P:  -sub q heparin  INFECTIOUS HIV - neg C. Diff stool PCR - negative Blood cultures( 4/17)- NGTD x2 A:  R/o aspiration treated with 5 dy course of Unasyn completed 4/24. Leucocytosis and vitals stable.  P:   - cont to follow  ENDOCRINE A:  No active issues P:   -SSI  NEUROLOGIC A:  AMS( likely delirium) from EtOH withdrawal and possible anoxic brain injury ( s/p cardiac arrest) that can not be ruled out.  CT head was negative.  MRI ( 4/22)- Negative for acute infract  EEG- slowing but no focal disease, seizure seems less likely P:   -Neuro following along  - Keppra for ? possible seizure on EEG - Limit sedation  - Continue IV Thiamine and folic acid daily -repeat NH4 level -cont reimaging since continued poor  progression of mental status  TODAY'S SUMMARY: Pt failed extubation yesterday 2/2 inability to protect airway and was reintubated. Trach 4/24. Post extubation neurological exam no focal deficits but not oriented or able to follow commands. Continue keppra.   Christen Bame, MD PGY-1 Pgr: [phone]   I have personally obtained a history, examined the patient, evaluated laboratory and imaging results, formulated the assessment and plan and placed orders.  CRITICAL CARE: The patient is critically ill with multiple organ systems failure and requires high complexity decision making for assessment and support, frequent evaluation and titration of therapies, application of advanced monitoring technologies and extensive interpretation of multiple databases. Critical Care Time devoted to patient care services described in this note is 40 minutes.   Dorcas Carrow Beeper  [phone]  Cell  [phone]  If no response or cell goes to voicemail, call beeper [phone]

## 2013-02-13 LAB — BLOOD GAS, ARTERIAL
Drawn by: 34779
PEEP: 5 cmH2O
Patient temperature: 98.6
Pressure control: 25 cmH2O
RATE: 12 resp/min
TCO2: 28.2 mmol/L (ref 0–100)
pH, Arterial: 7.521 — ABNORMAL HIGH (ref 7.350–7.450)

## 2013-02-13 LAB — URINALYSIS, ROUTINE W REFLEX MICROSCOPIC
Glucose, UA: NEGATIVE mg/dL
Leukocytes, UA: NEGATIVE
Protein, ur: 30 mg/dL — AB
Specific Gravity, Urine: 1.017 (ref 1.005–1.030)
Urobilinogen, UA: 0.2 mg/dL (ref 0.0–1.0)

## 2013-02-13 LAB — POCT I-STAT 3, ART BLOOD GAS (G3+)
Acid-Base Excess: 4 mmol/L — ABNORMAL HIGH (ref 0.0–2.0)
Bicarbonate: 28.7 mEq/L — ABNORMAL HIGH (ref 20.0–24.0)
O2 Saturation: 98 %
TCO2: 30 mmol/L (ref 0–100)
pO2, Arterial: 111 mmHg — ABNORMAL HIGH (ref 80.0–100.0)

## 2013-02-13 LAB — GLUCOSE, CAPILLARY: Glucose-Capillary: 98 mg/dL (ref 70–99)

## 2013-02-13 LAB — BASIC METABOLIC PANEL
CO2: 31 mEq/L (ref 19–32)
Calcium: 9.3 mg/dL (ref 8.4–10.5)
Creatinine, Ser: 1.17 mg/dL (ref 0.50–1.35)
Glucose, Bld: 116 mg/dL — ABNORMAL HIGH (ref 70–99)

## 2013-02-13 MED ORDER — QUETIAPINE FUMARATE 50 MG PO TABS
50.0000 mg | ORAL_TABLET | Freq: Every morning | ORAL | Status: DC
  Administered 2013-02-13 – 2013-02-23 (×10): 50 mg via ORAL
  Filled 2013-02-13 (×11): qty 1

## 2013-02-13 MED ORDER — LORAZEPAM 1 MG PO TABS: 1.0000 mg | ORAL_TABLET | Freq: Two times a day (BID) | ORAL | Status: DC

## 2013-02-13 MED ORDER — LORAZEPAM 2 MG/ML PO CONC: 1.0000 mg | Freq: Two times a day (BID) | ORAL | Status: DC

## 2013-02-13 MED ORDER — ALBUTEROL SULFATE (5 MG/ML) 0.5% IN NEBU
2.5000 mg | INHALATION_SOLUTION | Freq: Four times a day (QID) | RESPIRATORY_TRACT | Status: DC
  Administered 2013-02-13 – 2013-02-21 (×33): 2.5 mg via RESPIRATORY_TRACT
  Filled 2013-02-13 (×35): qty 0.5

## 2013-02-13 MED ORDER — OSMOLITE 1.2 CAL PO LIQD
1000.0000 mL | ORAL | Status: DC
  Administered 2013-02-13 – 2013-02-23 (×7): 1000 mL
  Filled 2013-02-13 (×17): qty 1000

## 2013-02-13 MED ORDER — LORAZEPAM 2 MG/ML IJ SOLN
1.0000 mg | INTRAMUSCULAR | Status: DC | PRN
  Administered 2013-02-14 – 2013-02-19 (×9): 2 mg via INTRAVENOUS
  Filled 2013-02-13 (×13): qty 1

## 2013-02-13 MED ORDER — FREE WATER
250.0000 mL | Freq: Three times a day (TID) | Status: DC
  Administered 2013-02-13 (×3): 250 mL

## 2013-02-13 MED ORDER — FENTANYL CITRATE 0.05 MG/ML IJ SOLN
50.0000 ug | INTRAMUSCULAR | Status: DC | PRN
  Administered 2013-02-13 – 2013-02-18 (×14): 100 ug via INTRAVENOUS
  Filled 2013-02-13 (×15): qty 2

## 2013-02-13 MED ORDER — POTASSIUM CHLORIDE 20 MEQ/15ML (10%) PO LIQD
30.0000 meq | ORAL | Status: AC
  Administered 2013-02-13: 30 meq
  Filled 2013-02-13 (×2): qty 22.5

## 2013-02-13 MED ORDER — IBUPROFEN 100 MG/5ML PO SUSP
400.0000 mg | Freq: Four times a day (QID) | ORAL | Status: DC | PRN
  Filled 2013-02-13: qty 20

## 2013-02-13 MED ORDER — IPRATROPIUM BROMIDE 0.02 % IN SOLN
0.5000 mg | Freq: Four times a day (QID) | RESPIRATORY_TRACT | Status: DC
  Administered 2013-02-13 – 2013-02-21 (×33): 0.5 mg via RESPIRATORY_TRACT
  Filled 2013-02-13 (×35): qty 2.5

## 2013-02-13 MED ORDER — POTASSIUM CHLORIDE 20 MEQ/15ML (10%) PO LIQD
ORAL | Status: AC
  Filled 2013-02-13: qty 30

## 2013-02-13 MED ORDER — FENTANYL CITRATE 0.05 MG/ML IJ SOLN
50.0000 ug | Freq: Once | INTRAMUSCULAR | Status: AC
  Administered 2013-02-13: 50 ug via INTRAVENOUS
  Filled 2013-02-13: qty 2

## 2013-02-13 MED ORDER — DEXTROSE 5 % IV SOLN
INTRAVENOUS | Status: DC
  Administered 2013-02-13: 50 mL via INTRAVENOUS

## 2013-02-13 MED ORDER — PRO-STAT SUGAR FREE PO LIQD
30.0000 mL | Freq: Every day | ORAL | Status: DC
  Administered 2013-02-13 – 2013-02-23 (×11): 30 mL
  Filled 2013-02-13 (×11): qty 30

## 2013-02-13 MED ORDER — LORAZEPAM 1 MG PO TABS
1.0000 mg | ORAL_TABLET | ORAL | Status: DC | PRN
  Administered 2013-02-13 (×3): 2 mg via ORAL
  Filled 2013-02-13 (×3): qty 2

## 2013-02-13 MED ORDER — FENTANYL 25 MCG/HR TD PT72
100.0000 ug | MEDICATED_PATCH | TRANSDERMAL | Status: DC
  Administered 2013-02-13: 100 ug via TRANSDERMAL
  Filled 2013-02-13: qty 2

## 2013-02-13 MED ORDER — DEXTROSE 5 % IV SOLN: INTRAVENOUS | Status: DC

## 2013-02-13 MED ORDER — QUETIAPINE FUMARATE 100 MG PO TABS
100.0000 mg | ORAL_TABLET | Freq: Every day | ORAL | Status: DC
  Administered 2013-02-13 – 2013-02-22 (×8): 100 mg via ORAL
  Filled 2013-02-13 (×12): qty 1

## 2013-02-13 NOTE — Progress Notes (Signed)
[facility] ADULT ICU REPLACEMENT PROTOCOL FOR AM LAB REPLACEMENT ONLY  The patient does apply for the [facility] Adult ICU Electrolyte Replacment Protocol based on the criteria listed below:   1. Is GFR >/= 40 ml/min? yes  Patient's GFR today is 68 2. Is urine output >/= 0.5 ml/kg/hr for the last 6 hours? yes Patient's UOP is .65 ml/kg/hr 3. Is BUN < 60 mg/dL? yes  Patient's BUN today is 31 4. Abnormal electrolyte(s): K 3.1 5. Ordered repletion with:per protocol 6. If a panic level lab has been reported, has the CCM MD in charge been notified? yes.   Physician:  Dr Bea Laura deterding  [place] 02/13/2013 5:32 AM

## 2013-02-13 NOTE — Progress Notes (Signed)
Pt placed back on full support at this time due to high RR And increased agitation. No other complications noted at this time. RT will monitor .

## 2013-02-13 NOTE — Progress Notes (Signed)
NEURO HOSPITALIST PROGRESS NOTE   SUBJECTIVE:                                                                                                                        Very agitated last night requiring ativan and starting propofol. Otherwise, no reported seizures or other neurological events. He remains on keppra. Mildly hypernatremic.  OBJECTIVE:                                                                                                                           Vital signs in last 24 hours: Temp:  [98.6 F (37 C)-99.6 F (37.6 C)] 98.9 F (37.2 C) (04/25 0400) Pulse Rate:  [50-87] 83 (04/25 0712) Resp:  [12-20] 15 (04/25 0712) BP: (75-171)/(48-97) 118/64 mmHg (04/25 0700) SpO2:  [94 %-100 %] 99 % (04/25 0712) FiO2 (%):  [40 %] 40 % (04/25 0600) Weight:  [52.8 kg (116 lb 6.5 oz)] 52.8 kg (116 lb 6.5 oz) (04/25 0440)  Intake/Output from previous day: 04/24 0701 - 04/25 0700 In: 1403.4 [I.V.:708.4; NG/GT:590; IV Piggyback:105] Out: 2555 [Urine:2325; Stool:230] Intake/Output this shift:   Nutritional status: NPO  Past Medical History  Diagnosis Date  . COPD (chronic obstructive pulmonary disease)   . Pneumonia   . Hypertension   . HOH (hard of hearing) 12/01/2011     Neurologic Exam:  Mental status: alert and awake but doesn't follows commands. CN 2-12: pupils 2-3 mm bilaterally, reactive to light. No gaze preference. EOM  Full without nystagmus. Face symmetric. Motor: moves all limbs spontaneously and symmetrically. Sensory: no tested. Coordination and gait: no tested. DTR's: 1 = all over. Plantars: downgoing No meningeal irritation signs.  Lab Results: Lab Results  Component Value Date/Time   CHOL 79 12/17/2011  5:38 AM   Lipid Panel No results found for this basename: CHOL, TRIG, HDL, CHOLHDL, VLDL, LDLCALC,  in the last 72 hours  Studies/Results: Chest Portable 1 View To Assess Tube Placement And Rule-out  Pneumothorax  02/12/2013  *RADIOLOGY REPORT*  Clinical Data: New tracheostomy tube placement.  Rule out pneumothorax.  PORTABLE CHEST - 1 VIEW  Comparison: One-view chest 02/12/2013.  Findings: A tracheostomy has been placed.  The tip is 7.5 cm above the carina.  Heart size is normal.  Mild interstitial disease is evident at both lung bases, left greater than right.  The upper lung fields are clear.  The visualized soft tissues and bony thorax are unremarkable.  There is no pneumothorax.  IMPRESSION:  1.  Satisfactory positioning of the tracheostomy tube without evidence for pneumothorax. 2.  Persistent interstitial and airspace disease at the lung bases bilaterally, left greater than right.  This remains concerning for infection or less likely edema.   Original Report Authenticated By: Marin Roberts, M.D.    Dg Chest Port 1 View  02/12/2013  *RADIOLOGY REPORT*  Clinical Data: COPD, hypertension, evaluate endotracheal tube position  PORTABLE CHEST - 1 VIEW  Comparison: Portable exam 0533 hours compared to 02/11/2013  Findings: Tip of endotracheal tube projects 4.2 cm above carina. Nasogastric tube extends into abdomen. Left jugular central venous catheter tip projects over SVC. Normal heart size, mediastinal contours, and pulmonary vascularity. Emphysematous changes with bilateral perihilar basilar infiltrates, question pulmonary edema less likely infection. No gross pleural effusion or pneumothorax.  IMPRESSION: COPD with superimposed pulmonary infiltrates, question pulmonary edema or less likely infection. When compared to previous exam, aeration in the left lung is slightly improved.   Original Report Authenticated By: Ulyses Southward, M.D.    Dg Chest Port 1 View  02/11/2013  *RADIOLOGY REPORT*  Clinical Data: Status post thoracentesis  PORTABLE CHEST - 1 VIEW  Comparison: One-view chest 02/09/2013.  Findings: Endotracheal tube is stable position.  A left IJ line is stable as well.  The interstitial  pattern has increased slightly, suggesting mild edema.  The lung bases are incompletely imaged. There is no pneumothorax.  IMPRESSION:  1.  Slight increase in a diffuse interstitial pattern, suggesting edema. 2.  The support apparatus is stable. 3.  Emphysematous changes are again noted.   Original Report Authenticated By: Marin Roberts, M.D.    Dg Abd Portable 1v  02/12/2013  *RADIOLOGY REPORT*  Clinical Data: NG tube placement.  PORTABLE ABDOMEN - 1 VIEW  Comparison: CT scan 02/05/2013.  Findings: The NG tube tip is in the antral region of the stomach. The bowel gas pattern is unremarkable.  A left lower quadrant colostomy is noted.  The lung bases are clear.  IMPRESSION: NG tube tip is in the antral region of the stomach.   Original Report Authenticated By: Rudie Meyer, M.D.     MEDICATIONS                                                                                                                       I have reviewed the patient's current medications.  ASSESSMENT/PLAN:  Agitated delirium. Non focal exam and thus will defer neuro-imaging at this moment. I wonder if keppra could be playing a role on patient's agitated behavior. Will get EEG today and then decide about stopping keppra. Will follow up.   Wyatt Portela, MD [facility] [phone]  02/13/2013, 7:58 AM

## 2013-02-13 NOTE — Progress Notes (Signed)
Clinical Dietitian from [employer] dtr; CSW returned call and left voicemail message with pt's dtr phone.    Angelia Mould, MSW, [place] [phone]

## 2013-02-13 NOTE — Progress Notes (Signed)
EEG completed.

## 2013-02-13 NOTE — Progress Notes (Signed)
PULMONARY  / CRITICAL CARE MEDICINE  Name: Zachary Walls MRN: [id number] DOB: Oct 19, 1956    ADMISSION DATE:  02/05/2013 CONSULTATION DATE:  40981  REFERRING MD :  Dr. Ignacia Palma PRIMARY SERVICE: PCCM  CHIEF COMPLAINT:  AMS  BRIEF PATIENT DESCRIPTION: Pt was last seen normal 4/16 at 4pm. Son found pt down in fetal position complaining of breathing. Pt had suspicious visitor that may have given pt drugs. 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 Pt continues to be altered and PCCM asked to manage.   SIGNIFICANT EVENTS / STUDIES:  4/17- AMS, admitted to ICU 4/18 - cardiac arrest with 7 minutes of CPR, emergent intubation, on pressor 4/18- eeg -slow, no focus 4/18 echo - 65%, WNL, euvolemic 4/19- clinical findings concerning seizures, keppra, more alert 4/22 MRI brain: Negative for acute infract. No acute intracranial abnormality 4/24 pt failed extubation unable to protect airway reintubated  LINES / TUBES: 4/18 ETT>>>4/24>>4/24 4/18 LIJ>>4/24 4/18 Foley>> (failed extraction 2/2 residual)  CULTURES: 4/17 BCx>>>neg  4/17 resp Cx>>neg 4/17 UCx>>neg 4/18 cdiff>>>neg 4/24 BCx>>> 4/24 UCx>>> 4/24 Resp Cx >>>  ANTIBIOTICS: 4/17 IV zosyn>>>4/20 4/18 unasyn>>>  4/23 4/17 Vanc >>>4/20  SUBJECTIVE: severe agitation overnight on 1.6 mg Precidex required Propofol. New fever 101.7.  VITAL SIGNS: Temp:  [98.6 F (37 C)-99.6 F (37.6 C)] 98.9 F (37.2 C) (04/25 0400) Pulse Rate:  [50-87] 79 (04/25 0820) Resp:  [12-20] 20 (04/25 0820) BP: (75-171)/(48-97) 112/62 mmHg (04/25 0820) SpO2:  [94 %-100 %] 100 % (04/25 0820) FiO2 (%):  [40 %] 40 % (04/25 0820) Weight:  [116 lb 6.5 oz (52.8 kg)] 116 lb 6.5 oz (52.8 kg) (04/25 0440) HEMODYNAMICS: CV stable   VENTILATOR SETTINGS: Vent Mode:  [-] PSV;CPAP FiO2 (%):  [40 %] 40 % Set Rate:  [12 bmp] 12 bmp PEEP:  [5 cmH20] 5 cmH20 Pressure Support:  [5 cmH20] 5 cmH20 Plateau Pressure:  [19 cmH20-25 cmH20]  21 cmH20 INTAKE / OUTPUT: Intake/Output     04/24 0701 - 04/25 0700 04/25 0701 - 04/26 0700   I.V. (mL/kg) 708.4 (13.4)    NG/GT 590    IV Piggyback 105    Total Intake(mL/kg) 1403.4 (26.6)    Urine (mL/kg/hr) 2325 (1.8)    Stool 230 (0.2)    Total Output 2555     Net -1151.6          Stool Occurrence 1 x      PHYSICAL EXAMINATION: General:  Opens eyes , tracks eyes, perrl, not following commands Neuro:  Not following commands, hyperreflexic but no clonus, slight asterxisis, non purposeful movements HEENT:  PERRL, Increased jvd Cardiovascular:  Tachycardia, RR, no m/r/g/S3/S4 Lungs:  BS distant Abdomen:  Colostomy with brown stool drainage, no r/g Musculoskeletal:  Deconditioned  Skin:  Multiple small non-draining abrasions  LABS:  Recent Labs Lab 02/06/13 0930  02/06/13 1034  02/07/13 0500  02/08/13 0500 02/09/13 0330 02/09/13 0447 02/10/13 0355 02/11/13 0401 02/11/13 1714 02/12/13 0400 02/13/13 0035 02/13/13 0300  HGB  --   --   --   < > 10.4*  --  10.1* 9.9*  --  10.2* 10.7*  --  10.7*  --   --   WBC  --   --   --   < > 14.0*  --  14.9* 10.9*  --  10.7* 9.9  --  10.0  --   --   PLT  --   --   --   < >  224  --  227 202  --  200 210  --  199  --   --   NA  --   < >  --   < > 132*  --  137 139  --  145 147*  --  151*  --  153*  K  --   < >  --   < > 4.1  --  4.6 4.1  --  3.7 3.5  --  3.1*  --  3.1*  CL  --   < >  --   < > 99  --  103 106  --  109 111  --  112  --  115*  CO2  --   < >  --   < > 24  --  28 29  --  31 30  --  32  --  31  GLUCOSE  --   < >  --   < > 154*  --  132* 105*  --  99 142*  --  102*  --  116*  BUN  --   < >  --   < > 23  --  26* 28*  --  27* 30*  --  29*  --  31*  CREATININE  --   < >  --   < > 1.64*  --  1.50* 1.31  --  1.14 1.06  --  1.15  --  1.17  CALCIUM  --   < >  --   < > 7.7*  --  8.3* 8.6  --  8.9 8.8  --  9.2  --  9.3  MG  --   --   --   --   --   --   --  2.4  --   --   --   --   --   --   --   PHOS  --   --   --   --   --   --    --  2.8  --   --   --   --   --   --   --   AST 249*  --   --   --  282*  --  354* 324*  --   --   --   --   --   --   --   ALT 47  --   --   --  52  --  73* 82*  --   --   --   --   --   --   --   ALKPHOS 49  --   --   --  44  --  46 34*  --   --   --   --   --   --   --   BILITOT 0.4  --   --   --  0.3  --  0.2* 0.3  --   --   --   --   --   --   --   PROT 5.7*  --   --   --  5.7*  --  5.8* 5.4*  --   --   --   --   --   --   --   ALBUMIN 3.0*  --   --   --  2.9*  --  2.8* 2.7*  --   --   --   --   --   --   --  APTT  --   --   --   --   --   --   --   --   --   --   --   --  28  --   --   INR 1.10  --   --   --   --   --   --   --   --   --   --   --  1.05  --   --   TROPONINI  --   --  0.87*  --   --   --   --   --   --   --   --   --   --   --   --   PHART  --   --   --   < >  --   < >  --   --  7.397  --   --  7.517*  --  7.521*  --   PCO2ART  --   --   --   < >  --   < >  --   --  44.2  --   --  35.7  --  33.4*  --   PO2ART  --   --   --   < >  --   < >  --   --  93.0  --   --  91.0  --  124.0*  --   < > = values in this interval not displayed.  Recent Labs Lab 02/12/13 1403 02/12/13 1658 02/12/13 2015 02/13/13 0021 02/13/13 0438  GLUCAP 123* 100* 97 122* 115*    4/18 CXR: big lungs, ETT and Left IJ in place 4/20 rt hilar infiltate? Vs chronic, ett wnl 4/21: emphysema with mild vascular congestion. N o focal disease 4/24: hyperinflation, flattened diaphragm, ?pulm congestion R>L  ASSESSMENT / PLAN:  PULMONARY A: Severe emphysema with bullae; COPD      VDRF- emergent intubation following cardiac arrest R/o asp was treated 5 days of unasyn>> 4/24 P:   -Duonebs  q4h -d/c solumedrol 4/23 -oral prednisone 40mg  daily -cont sbt cpap5 ps 5, goal 2 hrs -trach 4/24  CARDIOVASCULAR A:  Hx of HTN  Cardiac arrest with 7 min of CPR on 4/18 Shock, post arrest -resolved Tachycardia and HTN  Likely 2/2 alcohol withdrawal improving on precedex P:  -prn fentanyl  -all versed  d/c on 4/24 -cont bisoprolol   RENAL A:  ARF - FeNa 0.2%, likely prerenal- Creatinine at baseline( resolved) Hyponatremia on admission likely beer protomania- hypotonic (serum osmol 263)- Resolved Hypokalemia P:   -KVO IVF - free water q8  GASTROINTESTINAL A:  emergent Hartmann's procedure 3/13 2/2 SBO with colostomy non-revised by surgery 2/2 poor nutrition and overall medical status - CT abdomen with no intraabdominal process   Elevated Transaminases -AST elevated likely 2/2 alcohol - improving -4/21 P:   -PPI -continue TF   HEMATOLOGIC A:  Microcytic Anemia, normocytic. No signs of bleeding, Hg stable, anemia of chronic disease?  INR, plts stable P:  -sub q heparin  INFECTIOUS HIV - neg C. Diff stool PCR - negative Blood cultures( 4/17)- NGTD x2 A:  R/o aspiration treated with 5 dy course of Unasyn completed 4/24. Leucocytosis and vitals stable. Spiked new fever 101.7 on 4/25 P:   - pancultures pending  ENDOCRINE A:  No active issues P:   -SSI  NEUROLOGIC A:  AMS( likely delirium) from EtOH  withdrawal vs opidio w/d vs. possible anoxic brain injury ( s/p cardiac arrest) that can not be ruled out.  CT head was negative.  MRI ( 4/22)- Negative for acute infract  EEG- slowing but no focal disease, seizure seems less likely P:   -d/c propofol -fentanyl 100 mcg patch q72h -seroquel 50mg  AM and 100mg  QHS -ativan 1-2 q4h -Neuro following along  - Keppra for ? possible seizure on EEG- may substitute given agitation repeat EEG 4/25 - Limit sedation  - Continue IV Thiamine and folic acid daily -consider reimaging since continued poor progression of mental status  TODAY'S SUMMARY: Trach placed 4/24 w/o complication. Pt increasingly agitated overnight. Repeat EEG. Sedation and mood stabilizer started.   Christen Bame, MD PGY-1 Pgr: [phone]   I have personally obtained a history, examined the patient, evaluated laboratory and imaging results, formulated the assessment  and plan and placed orders.  CRITICAL CARE: The patient is critically ill with multiple organ systems failure and requires high complexity decision making for assessment and support, frequent evaluation and titration of therapies, application of advanced monitoring technologies and extensive interpretation of multiple databases. Critical Care Time devoted to patient care services described in this note is 40 minutes.   Dorcas Carrow Beeper  [phone]  Cell  [phone]  If no response or cell goes to voicemail, call beeper [phone]

## 2013-02-13 NOTE — Progress Notes (Signed)
PT Cancellation Note  Patient Details Name: Zachary Walls MRN: [id number] DOB: November 03, 1955   Cancelled Treatment:    Reason Eval/Treat Not Completed: Medical issues which prohibited therapy;Patient not medically ready.  Patient continues to have agitation - back on full support of vent and sedated.  Will hold PT today, and return tomorrow for PT evaluation if appropriate.   Vena Austria 02/13/2013, 2:07 PM [phone]

## 2013-02-13 NOTE — Progress Notes (Signed)
NUTRITION FOLLOW UP  Intervention:    To better meet re-estimated needs, increase Osmolite 1.2 rate to 60 ml/h with Prostat 30 ml once daily to increase intake to 1828 kcals, 95 gm protein, 1181 ml free water daily.  Nutrition Dx:   Inadequate oral intake related to inability to eat as evidenced by NPO status. Ongoing.  Goal:   Intake to meet >90% of estimated nutrition needs, met.  Monitor:   TF tolerance/adequacy, weight trend, labs, vent status.  Assessment:   Patient failed extubation and required a trach on 4/24.  Patient remains on ventilator support.  MV: 9.6 Temp:Temp (24hrs), Avg:99.7 F (37.6 C), Min:98.6 F (37 C), Max:101.7 F (38.7 C)  Patient is tolerating TF well to meet 100% of estimated calorie and protein needs. Potassium is low today.  Height: Ht Readings from Last 1 Encounters:  02/05/13 5' 6.93" (1.7 m)    Weight Status:  stable Wt Readings from Last 1 Encounters:  02/13/13 116 lb 6.5 oz (52.8 kg)  02/09/13  118 lb 13.3 oz (53.9 kg) 02/06/13  117 lb 1 oz (53.1 kg)   Body mass index is 18.27 kg/(m^2). Underweight   Re-estimated needs:  Kcal: 1815 Protein: 80-100 gm Fluid: 1.8-1.9 L  Skin: no problems noted  Diet Order: NPO  TF Order: Osmolite 1.2 at 50 ml/h with Prostat 30 ml BID for a total intake of 1640 kcals, 97 gm protein, 984 ml free water daily. Free water flushes: 250 ml every 8 hours MVI via tube daily.    Intake/Output Summary (Last 24 hours) at 02/13/13 1114 Last data filed at 02/13/13 1100  Gross per 24 hour  Intake 1373.77 ml  Output   1670 ml  Net -296.23 ml    Last BM: 4/25 (colostomy)   Labs:   Recent Labs Lab 02/09/13 0330  02/11/13 0401 02/12/13 0400 02/13/13 0300  NA 139  < > 147* 151* 153*  K 4.1  < > 3.5 3.1* 3.1*  CL 106  < > 111 112 115*  CO2 29  < > 30 32 31  BUN 28*  < > 30* 29* 31*  CREATININE 1.31  < > 1.06 1.15 1.17  CALCIUM 8.6  < > 8.8 9.2 9.3  MG 2.4  --   --   --   --   PHOS 2.8  --    --   --   --   GLUCOSE 105*  < > 142* 102* 116*  < > = values in this interval not displayed.  CBG (last 3)   Recent Labs  02/13/13 0021 02/13/13 0438 02/13/13 0733  GLUCAP 122* 115* 98    Scheduled Meds: . albuterol  2.5 mg Nebulization Q4H  . antiseptic oral rinse  15 mL Mouth Rinse QID  . bisoprolol  20 mg Oral Daily  . chlorhexidine  15 mL Mouth Rinse BID  . feeding supplement  30 mL Per Tube BID  . fentaNYL  100 mcg Transdermal Q72H  . folic acid  1 mg Per Tube Daily  . free water  250 mL Per Tube Q8H  . heparin  5,000 Units Subcutaneous Q8H  . insulin aspart  0-9 Units Subcutaneous Q4H  . ipratropium  0.5 mg Nebulization QID  . levETIRAcetam  500 mg Oral BID  . multivitamin  5 mL Per Tube Daily  . pantoprazole sodium  40 mg Per Tube Q1200  . potassium chloride  30 mEq Per Tube Q4H  . predniSONE  20 mg  Per Tube Q breakfast  . QUEtiapine  100 mg Oral QHS  . QUEtiapine  50 mg Oral q morning - 10a  . thiamine  100 mg Oral Daily    Continuous Infusions: . feeding supplement (OSMOLITE 1.2 CAL) 1 mL (02/13/13 0848)    Joaquin Courts, RD, LDN, CNSC Pager [phone] After Hours Pager [phone]

## 2013-02-13 NOTE — Progress Notes (Signed)
Trach Team  Pt with new trach 4/24. Per char currently sedated on vent. Will continue to follow for readiness for PMSV. Harlon Ditty, MA CCC-SLP [phone]

## 2013-02-14 ENCOUNTER — Inpatient Hospital Stay (HOSPITAL_COMMUNITY): Payer: Medicaid Other

## 2013-02-14 LAB — COMPREHENSIVE METABOLIC PANEL
Albumin: 2.3 g/dL — ABNORMAL LOW (ref 3.5–5.2)
Alkaline Phosphatase: 43 U/L (ref 39–117)
BUN: 27 mg/dL — ABNORMAL HIGH (ref 6–23)
Creatinine, Ser: 1.24 mg/dL (ref 0.50–1.35)
GFR calc Af Amer: 73 mL/min — ABNORMAL LOW (ref >90)
Glucose, Bld: 106 mg/dL — ABNORMAL HIGH (ref 70–99)
Potassium: 3.5 mEq/L (ref 3.5–5.1)
Total Bilirubin: 0.3 mg/dL (ref 0.3–1.2)
Total Protein: 5.7 g/dL — ABNORMAL LOW (ref 6.0–8.3)

## 2013-02-14 LAB — BLOOD GAS, ARTERIAL
Acid-Base Excess: 7 mmol/L — ABNORMAL HIGH (ref 0.0–2.0)
Bicarbonate: 28.4 mEq/L — ABNORMAL HIGH (ref 20.0–24.0)
Drawn by: 13898
Drawn by: 13898
FIO2: 0.35 %
FIO2: 0.4 %
O2 Saturation: 99.6 %
O2 Saturation: 98.7 %
PEEP: 5 cmH2O
Patient temperature: 98.6
RATE: 12 resp/min
TCO2: 32.1 mmol/L (ref 0–100)
pH, Arterial: 7.438 (ref 7.350–7.450)
pO2, Arterial: 143 mmHg — ABNORMAL HIGH (ref 80.0–100.0)

## 2013-02-14 LAB — CBC
HCT: 29.4 % — ABNORMAL LOW (ref 39.0–52.0)
Hemoglobin: 10 g/dL — ABNORMAL LOW (ref 13.0–17.0)
MCH: 30.9 pg (ref 26.0–34.0)
MCHC: 34 g/dL (ref 30.0–36.0)
MCV: 90.7 fL (ref 78.0–100.0)
RDW: 15 % (ref 11.5–15.5)

## 2013-02-14 LAB — GLUCOSE, CAPILLARY
Glucose-Capillary: 128 mg/dL — ABNORMAL HIGH (ref 70–99)
Glucose-Capillary: 91 mg/dL (ref 70–99)
Glucose-Capillary: 77 mg/dL (ref 70–99)
Glucose-Capillary: 85 mg/dL (ref 70–99)

## 2013-02-14 LAB — BASIC METABOLIC PANEL
BUN: 25 mg/dL — ABNORMAL HIGH (ref 6–23)
CO2: 30 mEq/L (ref 19–32)
Chloride: 115 mEq/L — ABNORMAL HIGH (ref 96–112)
GFR calc Af Amer: 84 mL/min — ABNORMAL LOW (ref >90)
Glucose, Bld: 109 mg/dL — ABNORMAL HIGH (ref 70–99)
Potassium: 3.4 mEq/L — ABNORMAL LOW (ref 3.5–5.1)

## 2013-02-14 LAB — URINE CULTURE: Special Requests: NORMAL

## 2013-02-14 LAB — MAGNESIUM: Magnesium: 1.9 mg/dL (ref 1.5–2.5)

## 2013-02-14 MED ORDER — FREE WATER
250.0000 mL | Freq: Four times a day (QID) | Status: DC
  Administered 2013-02-14 – 2013-02-23 (×29): 250 mL

## 2013-02-14 MED ORDER — LORAZEPAM 2 MG/ML IJ SOLN
2.0000 mg | Freq: Once | INTRAMUSCULAR | Status: AC
  Administered 2013-02-14: 2 mg via INTRAVENOUS
  Filled 2013-02-14: qty 1
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
## 2013-02-14 NOTE — Evaluation (Signed)
Physical Therapy Evaluation Patient Details Name: Zachary Walls MRN: [id number] DOB: [birth year] Today's Date: 02/14/2013 Time: 1478-2956 PT Time Calculation (min): 13 min  PT Assessment / Plan / Recommendation Clinical Impression  Pt adm with AMS.  Pt with VDRF and had cardiac arrest. Needs skilled PT to maximize I and safety to decr burden of care.  Currently pt not following any commands even with cues or gestures.  Recommend SNF.  From old chart pt was at [facility] in March of 2013.    PT Assessment  Patient needs continued PT services    Follow Up Recommendations  SNF    Does the patient have the potential to tolerate intense rehabilitation      Barriers to Discharge Decreased caregiver support      Equipment Recommendations  None recommended by PT    Recommendations for Other Services     Frequency Min 2X/week    Precautions / Restrictions Precautions Precautions: Fall Restrictions Weight Bearing Restrictions: No   Pertinent Vitals/Pain No signs of pain.      Mobility  Bed Mobility Bed Mobility: Supine to Sit;Sitting - Scoot to Edge of Bed Supine to Sit: 1: +2 Total assist Supine to Sit: Patient Percentage: 0% Sitting - Scoot to Edge of Bed: 1: +1 Total assist Details for Bed Mobility Assistance: Pt not following commands or gestures to assist. Transfers Transfers: Sit to Stand;Stand to Sit;Stand Pivot Transfers Sit to Stand: 1: +2 Total assist Sit to Stand: Patient Percentage: 20% Stand to Sit: 1: +2 Total assist Stand to Sit: Patient Percentage: 20% Stand Pivot Transfers: 1: +2 Total assist Stand Pivot Transfers: Patient Percentage: 20% Details for Transfer Assistance: On first stand pt began assisting and came into full stand.  On second attempt pt kept legs spread extremely wide and assisted less.  Never came to full stand and hips had to be pivoted to chair.    Exercises     PT Diagnosis: Difficulty walking;Generalized weakness;Altered mental  status  PT Problem List: Decreased strength;Decreased balance;Decreased mobility;Decreased cognition;Decreased knowledge of use of DME;Decreased safety awareness;Decreased knowledge of precautions PT Treatment Interventions: DME instruction;Gait training;Patient/family education;Functional mobility training;Therapeutic activities;Therapeutic exercise;Balance training;Cognitive remediation   PT Goals Acute Rehab PT Goals PT Goal Formulation: Patient unable to participate in goal setting Time For Goal Achievement: 02/28/13 Potential to Achieve Goals: Fair Pt will go Supine/Side to Sit: with min assist PT Goal: Supine/Side to Sit - Progress: Goal set today Pt will go Sit to Supine/Side: with min assist PT Goal: Sit to Supine/Side - Progress: Goal set today Pt will go Sit to Stand: with min assist PT Goal: Sit to Stand - Progress: Goal set today Pt will go Stand to Sit: with min assist PT Goal: Stand to Sit - Progress: Goal set today Pt will Ambulate: 16 - 50 feet;with +2 total assist PT Goal: Ambulate - Progress: Goal set today  Visit Information  Last PT Received On: 02/14/13    Subjective Data  Subjective: No verbalizations.  Pt on trach collar. Patient Stated Goal: Unable to state.   Prior Functioning  Home Living Additional Comments: Pt unable to give any information.    Cognition  Cognition Arousal/Alertness: Awake/alert Behavior During Therapy: Restless Overall Cognitive Status: Impaired/Different from baseline Area of Impairment: Safety/judgement;Attention Current Attention Level: Focused Safety/Judgement: Decreased awareness of safety;Decreased awareness of deficits General Comments: Does not follow any commands. Difficult to assess due to: Tracheostomy    Extremity/Trunk Assessment Right Lower Extremity Assessment RLE ROM/Strength/Tone: Unable to  fully assess;Due to impaired cognition;Deficits RLE ROM/Strength/Tone Deficits: Active movement against gravity  noted. Left Lower Extremity Assessment LLE ROM/Strength/Tone: Unable to fully assess;Due to impaired cognition;Deficits LLE ROM/Strength/Tone Deficits: Active movement against gravity noted.   Balance Balance Balance Assessed: Yes Static Sitting Balance Static Sitting - Balance Support: Bilateral upper extremity supported Static Sitting - Level of Assistance: 4: Min assist;2: Max assist Static Sitting - Comment/# of Minutes: Sat x 5 minutes in preparation for transfer.  Assist varied due to cognition.  At times pt trying to push anteriorly and at others sitting fairly calmly.  End of Session PT - End of Session Activity Tolerance: Other (comment) (limited due to cognition.) Patient left: in chair;with call bell/phone within reach (with mitts on hands) Nurse Communication: Mobility status  GP     [facility] 02/14/2013, 10:08 AM  Skip Mayer PT [phone]

## 2013-02-14 NOTE — Progress Notes (Signed)
Increased work of breathing   Rest on vent overnight

## 2013-02-14 NOTE — Procedures (Signed)
EEG report.  Brief clinical history:57 years old male admitted to [facility] with altered mental status and possible ETOH related seizure. Extreme agitation in the past 24 hours.   Technique: this is a 17 channel routine scalp EEG performed at the bedside with bipolar and monopolar montages arranged in accordance to the international 10/20 system of electrode placement. One channel was dedicated to EKG recording.  Patient is intubated on the vent. No sleep was achieved during the test. No activating procedures.  Description: patient is very restless for the most part of the test which precludes a reliable assessment of the EEG background. In addition, the study is contaminated by significant amount of artifacts. Overall, there is not evidence of focal or generalized slowing or epileptiform discharges  EKG showed sinus rhythm.  Impression: this is a normal awake EEG. Please, be aware that a normal EEG does not exclude the possibility of epilepsy.  Clinical correlation is advised.  Wyatt Portela, MD

## 2013-02-14 NOTE — Progress Notes (Signed)
Pt. Transferred to room 2601.   Prior to transfer, pt. Pulled out NG tube, despite use of hand mitts.  Is making motions with his hands that he wants to have a "drink".  Affect inappropriate / smiling continuously

## 2013-02-14 NOTE — Progress Notes (Signed)
Report called to [facility].  Transported pt. In chair with oxygen accompanied by RN and tech.  VSS.  No distress noted.  RT aware of pt. Transfer.  Family notified

## 2013-02-14 NOTE — Progress Notes (Signed)
#  12 Fr NG tube inserted for tube feedings post self-removal.  X Ray called to confirm placement prior to re-initiating.

## 2013-02-14 NOTE — Progress Notes (Signed)
Pt's son updated on pt's condition requiring pt to be restrained. Son is also notified that pt is currently back on the ventilator.

## 2013-02-14 NOTE — Progress Notes (Signed)
NEURO HOSPITALIST PROGRESS NOTE   SUBJECTIVE:                                                                                                                        Pleasant at this moment, but still with burst of agitation and inappropriate behavior towards nursing staff. Just removed his NGT. No other neurological developments. EEG normal.  Keppra 500 mg BID.  OBJECTIVE:                                                                                                                           Vital signs in last 24 hours: Temp:  [98 F (36.7 C)-100.2 F (37.9 C)] 100.2 F (37.9 C) (04/26 1230) Pulse Rate:  [71-90] 72 (04/26 1522) Resp:  [12-29] 22 (04/26 1522) BP: (85-139)/(39-75) 99/52 mmHg (04/26 1522) SpO2:  [94 %-100 %] 95 % (04/26 1522) FiO2 (%):  [35 %-40 %] 35 % (04/26 1522) Weight:  [52 kg (114 lb 10.2 oz)] 52 kg (114 lb 10.2 oz) (04/26 1230)  Intake/Output from previous day: 04/25 0701 - 04/26 0700 In: 2518.5 [I.V.:458.5; NG/GT:2060] Out: 1495 [Urine:1195; Stool:300] Intake/Output this shift: Total I/O In: 1020 [I.V.:100; NG/GT:920] Out: 300 [Urine:300] Nutritional status: NPO  Past Medical History  Diagnosis Date  . COPD (chronic obstructive pulmonary disease)   . Pneumonia   . Hypertension   . HOH (hard of hearing) 12/01/2011      Neurologic Exam:  Mental status: alert and awake but doesn't follows commands consistently.  CN 2-12: pupils 2-3 mm bilaterally, reactive to light. No gaze preference. EOM Full without nystagmus. Face symmetric.  Motor: moves all limbs spontaneously and symmetrically.  Sensory: no tested.  Coordination and gait: no tested.  DTR's: 1 all over.  Plantars: downgoing  No meningeal irritation signs.  Lab Results: Lab Results  Component Value Date/Time   CHOL 79 12/17/2011  5:38 AM   Lipid Panel No results found for this basename: CHOL, TRIG, HDL, CHOLHDL, VLDL, LDLCALC,  in the last 72  hours  Studies/Results: Dg Abd Portable 1v  02/12/2013  *RADIOLOGY REPORT*  Clinical Data: NG tube placement.  PORTABLE ABDOMEN - 1 VIEW  Comparison: CT scan 02/05/2013.  Findings: The NG tube tip is in  the antral region of the stomach. The bowel gas pattern is unremarkable.  A left lower quadrant colostomy is noted.  The lung bases are clear.  IMPRESSION: NG tube tip is in the antral region of the stomach.   Original Report Authenticated By: Rudie Meyer, M.D.     MEDICATIONS                                                                                                                       I have reviewed the patient's current medications.  ASSESSMENT/PLAN:                                                                                                           Probable ETOH withdrawal seizure. Normal EEG. On keppra. I am still suspicious that keppra could be implicated on patient's altered behavior. Will stop keppra. Will continue to follow.  Wyatt Portela ,MD [facility] [phone]  02/14/2013, 5:01 PM

## 2013-02-14 NOTE — Progress Notes (Signed)
CRITICAL VALUE ALERT  Critical value received:  Trop=0.38  Date of notification:  02/14/13  Time of notification:  2213  Critical value read back:yes  Nurse who received alert:  Suhailah Kwan. RN  MD notified (1st page):  Dr. Frederico Hamman  Time of first page:  2215  MD notified (2nd page):  Time of second page:  Responding MD:  Dr. Frederico Hamman  Time MD responded:  2215

## 2013-02-15 ENCOUNTER — Inpatient Hospital Stay (HOSPITAL_COMMUNITY): Payer: Medicaid Other

## 2013-02-15 LAB — BASIC METABOLIC PANEL
BUN: 27 mg/dL — ABNORMAL HIGH (ref 6–23)
Chloride: 111 mEq/L (ref 96–112)
Creatinine, Ser: 1.21 mg/dL (ref 0.50–1.35)
GFR calc Af Amer: 76 mL/min — ABNORMAL LOW (ref >90)
GFR calc non Af Amer: 65 mL/min — ABNORMAL LOW (ref >90)
Glucose, Bld: 108 mg/dL — ABNORMAL HIGH (ref 70–99)

## 2013-02-15 LAB — CBC
HCT: 28.9 % — ABNORMAL LOW (ref 39.0–52.0)
MCH: 30.7 pg (ref 26.0–34.0)
MCHC: 33.9 g/dL (ref 30.0–36.0)
RDW: 15.2 % (ref 11.5–15.5)

## 2013-02-15 NOTE — Progress Notes (Signed)
PULMONARY  / CRITICAL CARE MEDICINE  Name: Zachary Walls MRN: [id number] DOB: [birth year]    ADMISSION DATE:  02/05/2013 CONSULTATION DATE:  30865  REFERRING MD :  Dr. Ignacia Palma PRIMARY SERVICE: PCCM  CHIEF COMPLAINT:  AMS  BRIEF PATIENT DESCRIPTION: Pt was last seen normal 4/16 at 4pm. Son found pt down in fetal position complaining of breathing. Pt had suspicious visitor that may have given pt drugs. 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 Pt continues to be altered and PCCM asked to manage.   SIGNIFICANT EVENTS / STUDIES:  4/17- AMS, admitted to ICU 4/18 - cardiac arrest with 7 minutes of CPR, emergent intubation, on pressor 4/18- eeg -slow, no focus 4/18 echo - 65%, WNL, euvolemic 4/19- clinical findings concerning seizures, keppra, more alert 4/22 MRI brain: Negative for acute infract. No acute intracranial abnormality 4/24 pt failed extubation unable to protect airway reintubated  LINES / TUBES: 4/18 ETT>>>4/24>>4/24 4/18 LIJ>>4/24 4/18 Foley>> (failed extraction 2/2 residual)  CULTURES: 4/17 BCx>>>neg  4/17 resp Cx>>neg 4/17 UCx>>neg 4/18 cdiff>>>neg 4/24 BCx>>> 4/24 UCx>>> 4/24 Resp Cx >>>  ANTIBIOTICS: 4/17 IV zosyn>>>4/20 4/18 unasyn>>>  4/23 4/17 Vanc >>>4/20  SUBJECTIVE: Rested on vent overnight, and now TC and looks comfortable.  Gets agitated at times, but controlled currently.   VITAL SIGNS: Temp:  [98.6 F (37 C)-100.7 F (38.2 C)] 99.8 F (37.7 C) (04/27 1115) Pulse Rate:  [70-89] 70 (04/27 1200) Resp:  [15-29] 24 (04/27 1200) BP: (99-142)/(52-94) 116/61 mmHg (04/27 1200) SpO2:  [94 %-100 %] 100 % (04/27 1200) FiO2 (%):  [35 %-40 %] 35 % (04/27 1115) Weight:  [49 kg (108 lb 0.4 oz)-52 kg (114 lb 10.2 oz)] 49 kg (108 lb 0.4 oz) (04/27 0500) HEMODYNAMICS: CV stable   VENTILATOR SETTINGS: Vent Mode:  [-] PCV FiO2 (%):  [35 %-40 %] 35 % Set Rate:  [12 bmp] 12 bmp PEEP:  [5 cmH20] 5 cmH20 Plateau Pressure:  [17  cmH20-24 cmH20] 19 cmH20 INTAKE / OUTPUT: Intake/Output     04/26 0701 - 04/27 0700 04/27 0701 - 04/28 0700   I.V. (mL/kg) 470 (9.6)    NG/GT 1270 680   Total Intake(mL/kg) 1740 (35.5) 680 (13.9)   Urine (mL/kg/hr) 1225 (1) 450 (1.8)   Stool     Total Output 1225 450   Net +515 +230          PHYSICAL EXAMINATION: General: arousable male in nad Neuro:  Not following commands, somewhat arousable HEENT:  Nose without purulence or d/c noted.  Trach in place Cardiovascular:  Mild tachy but regular Lungs:  BS distant, a few basilar crackles, no wheezing Abdomen:  Colostomy with brown stool drainage, no r/g LE without edema, no cyanosis  LABS:  Recent Labs Lab 02/09/13 0330  02/11/13 0401  02/12/13 0400 02/13/13 0035  02/14/13 0555 02/14/13 1933 02/14/13 2045 02/14/13 2124 02/15/13 0143 02/15/13 0144 02/15/13 1013  HGB 9.9*  < > 10.7*  --  10.7*  --   --   --   --   --  10.0* 9.8*  --   --   WBC 10.9*  < > 9.9  --  10.0  --   --   --   --   --  16.7* 15.9*  --   --   PLT 202  < > 210  --  199  --   --   --   --   --  258  241  --   --   NA 139  < > 147*  --  151*  --   < > 153*  --   --  153* 150*  --   --   K 4.1  < > 3.5  --  3.1*  --   < > 3.4*  --   --  3.5 3.3*  --   --   CL 106  < > 111  --  112  --   < > 115*  --   --  115* 111  --   --   CO2 29  < > 30  --  32  --   < > 30  --   --  32 31  --   --   GLUCOSE 105*  < > 142*  --  102*  --   < > 109*  --   --  106* 108*  --   --   BUN 28*  < > 30*  --  29*  --   < > 25*  --   --  27* 27*  --   --   CREATININE 1.31  < > 1.06  --  1.15  --   < > 1.11  --   --  1.24 1.21  --   --   CALCIUM 8.6  < > 8.8  --  9.2  --   < > 9.4  --   --  9.2 9.2  --   --   MG 2.4  --   --   --   --   --   --   --   --   --  1.9  --   --   --   PHOS 2.8  --   --   --   --   --   --   --   --   --   --   --   --   --   AST 324*  --   --   --   --   --   --   --   --   --  96*  --   --   --   ALT 82*  --   --   --   --   --   --   --   --   --   78*  --   --   --   ALKPHOS 34*  --   --   --   --   --   --   --   --   --  43  --   --   --   BILITOT 0.3  --   --   --   --   --   --   --   --   --  0.3  --   --   --   PROT 5.4*  --   --   --   --   --   --   --   --   --  5.7*  --   --   --   ALBUMIN 2.7*  --   --   --   --   --   --   --   --   --  2.3*  --   --   --   APTT  --   --   --   --  28  --   --   --   --   --   --   --   --   --   INR  --   --   --   --  1.05  --   --   --   --   --   --   --   --   --   TROPONINI  --   --   --   --   --   --   --   --   --   --  0.38*  --  0.41* <0.30  PHART  --   < >  --   < >  --  7.521*  --   --  7.477* 7.438  --   --   --   --   PCO2ART  --   < >  --   < >  --  33.4*  --   --  42.1 42.7  --   --   --   --   PO2ART  --   < >  --   < >  --  124.0*  --   --  143.0* 85.2  --   --   --   --   < > = values in this interval not displayed.  Recent Labs Lab 02/14/13 1651 02/14/13 2007 02/14/13 2318 02/15/13 0433 02/15/13 0728  GLUCAP 94 77 85 107* 87    CXR: no film 4/26  ASSESSMENT / PLAN: Principal Problem:   Cardiac arrest Active Problems:   COPD (chronic obstructive pulmonary disease)   Severe protein-calorie malnutrition   Altered mental status   Anoxic encephalopathy   Respiratory failure, acute and chronic   PULMONARY A: Severe emphysema with bullae; COPD      VDRF- emergent intubation following cardiac arrest R/o asp was treated 5 days of unasyn>> 4/24 S/p trach 4/24 He is tolerating TC well today with no increased wob  P:   -Duonebs  q6h -on tapering prednisone -TC trials as tolerated.    CARDIOVASCULAR A:  Hx of HTN  Cardiac arrest with 7 min of CPR on 4/18 Shock, post arrest -resolved Tachycardia and HTN  Likely 2/2 alcohol withdrawal?  P:  -cont bisoprolol    GASTROINTESTINAL A:  emergent Hartmann's procedure 3/13 2/2 SBO with colostomy non-revised by surgery 2/2 poor nutrition and overall medical status - CT abdomen with no intraabdominal process    Elevated Transaminases -AST elevated likely 2/2 alcohol - improving -4/21 P:   -PPI -continue TF     ENDOCRINE A:  No active issues P:   -SSI  NEUROLOGIC A:  AMS( likely delirium) from EtOH withdrawal vs opidio w/d vs. possible anoxic brain injury ( s/p cardiac arrest) that can not be ruled out.  CT head was negative.  MRI ( 4/22)- Negative for acute infract  EEG- slowing but no focal disease, seizure seems less likely P:   -cont-fentanyl 100 mcg patch q72h -cont seroquel 50mg  AM and 100mg  QHS -Neuro following along  -- Continue IV Thiamine and folic acid daily

## 2013-02-15 NOTE — Progress Notes (Signed)
Restraints released briefly for patient care and assessment. Son visited and wanted [place] to try off restraints.Son talked to the patient and the patient acknowledged that he will not pull out tubes.Patient pulled out NGT and IV. NGT tube replaced and KUB ordered per protocol.

## 2013-02-16 DIAGNOSIS — Z930 Tracheostomy status: Secondary | ICD-10-CM

## 2013-02-16 LAB — BASIC METABOLIC PANEL
BUN: 28 mg/dL — ABNORMAL HIGH (ref 6–23)
CO2: 30 mEq/L (ref 19–32)
Chloride: 110 mEq/L (ref 96–112)
Creatinine, Ser: 1.07 mg/dL (ref 0.50–1.35)
GFR calc Af Amer: 88 mL/min — ABNORMAL LOW (ref >90)
Potassium: 3.2 mEq/L — ABNORMAL LOW (ref 3.5–5.1)

## 2013-02-16 LAB — GLUCOSE, CAPILLARY
Glucose-Capillary: 94 mg/dL (ref 70–99)
Glucose-Capillary: 117 mg/dL — ABNORMAL HIGH (ref 70–99)
Glucose-Capillary: 102 mg/dL — ABNORMAL HIGH (ref 70–99)
Glucose-Capillary: 95 mg/dL (ref 70–99)

## 2013-02-16 MED ORDER — POTASSIUM CHLORIDE 20 MEQ/15ML (10%) PO LIQD
40.0000 meq | Freq: Three times a day (TID) | ORAL | Status: AC
  Administered 2013-02-16: 40 meq
  Filled 2013-02-16: qty 30

## 2013-02-16 MED ORDER — FENTANYL 100 MCG/HR TD PT72
100.0000 ug | MEDICATED_PATCH | TRANSDERMAL | Status: DC
  Administered 2013-02-16 – 2013-02-22 (×3): 100 ug via TRANSDERMAL
  Filled 2013-02-16 (×3): qty 1

## 2013-02-16 NOTE — Clinical Social Work Psychosocial (Signed)
     Clinical Social Work Department BRIEF PSYCHOSOCIAL ASSESSMENT 02/16/2013  Patient:  Zachary Walls, Zachary Walls     Account Number:  [id number]     Admit date:  02/05/2013  Clinical Social Worker:  Margaree Mackintosh  Date/Time:  02/16/2013 12:00 M  Referred by:  Physician  Date Referred:  02/16/2013 Referred for  SNF Placement   Other Referral:   Interview type:  Patient Other interview type:   with DTR-in-Law present.    PSYCHOSOCIAL DATA Living Status:  ALONE Admitted from facility:   Level of care:   Primary support name:  TJ Kaupp: [phone] Primary support relationship to patient:  CHILD, ADULT Degree of support available:   Unknown.    CURRENT CONCERNS Current Concerns  Post-Acute Placement   Other Concerns:    SOCIAL WORK ASSESSMENT / PLAN Clinicval Social Worker received referral for potential Trach SNF placement at [facility].  CSW reviewed chart and met with pt and pt's dtr in law at bedside.  CSW introduced self, explained role, and provided support.  CSW relayed that this CSW has been attempting to get in touch with pt's children; Marcelino Duster (Dtr-in-law) stated, "They are hard to get in touch with, you can give me a call if needed [phone]".  CSW thanked dtr-in-law for information.  CSW reviewed PT recommendations and provided SNF list.  CSW reviewed which facilities are trach facilties.  Dtr in law stated understanding.  CSW provided contact information and encouraged family to call with questions.  CSW to continue to follow and assist as needed.   Assessment/plan status:  Information/Referral to [facility] Other assessment/ plan:   Information/referral to community resources:   SNF    PATIENTS/FAMILYS RESPONSE TO PLAN OF CARE: Pt did not actively engage in conversation.  Dtr in law thanked CSW for intervention.

## 2013-02-16 NOTE — Progress Notes (Signed)
Pt is on 28% ATC and is tolerating well. Pt will remain on ATC and off vent as long as tolerated according to note by Dr. Molli Knock.

## 2013-02-16 NOTE — Evaluation (Signed)
Occupational Therapy Evaluation Patient Details Name: NYSHAUN STANDAGE MRN: [id number] DOB: 30-Jul-1956 Today's Date: 02/16/2013 Time: 2956-2130 OT Time Calculation (min): 10 min  OT Assessment / Plan / Recommendation Clinical Impression   This 57 y.o. Male with significant ETOH abuse history was found at home in fetal position with difficulty breating.  Pt with cardiac arrest 4/18 with 7 mins of CPR and emergent intubation.  MRI neg for acute infarct.  Pt currently on trach collar 28% FIO2.  Pt. Following commands today and is moving well to EOBl, but is disoriented and confused with no awareness of deficits.  Pt is limited by cognitive deficits.  He will benefit from acute OT to maximize safety and independence with BADLs and facillitate transfer to next venue of care.  He will likely require SNF level rehab at discharge.     OT Assessment  Patient needs continued OT Services    Follow Up Recommendations  SNF;Supervision/Assistance - 24 hour    Barriers to Discharge Decreased caregiver support per chart  Equipment Recommendations  None recommended by OT    Recommendations for Other Services    Frequency  Min 2X/week    Precautions / Restrictions Precautions Precautions: Fall Restrictions Weight Bearing Restrictions: No       ADL  Eating/Feeding: NPO Grooming: Wash/dry face;Minimal assistance Where Assessed - Grooming: Supine, head of bed up Upper Body Bathing: +1 Total assistance Where Assessed - Upper Body Bathing: Supine, head of bed up Lower Body Bathing: +1 Total assistance Where Assessed - Lower Body Bathing: Rolling right and/or left;Supine, head of bed flat;Supine, head of bed up Upper Body Dressing: +1 Total assistance Where Assessed - Upper Body Dressing: Supine, head of bed up Lower Body Dressing: +1 Total assistance Where Assessed - Lower Body Dressing: Supine, head of bed up;Supine, head of bed flat;Rolling right and/or left ADL Comments: Pt sitting EOB in  restraints with bed alarm going off.  Pt. assisted back to bed with nursing assist.  Pt. is following one step commands today, but is unsafe for functional mobility or further ADLs without second person for safety and 2nd person assist not available this date    OT Diagnosis: Generalized weakness;Cognitive deficits;Altered mental status  OT Problem List: Decreased strength;Decreased activity tolerance;Impaired balance (sitting and/or standing);Decreased cognition;Decreased safety awareness;Decreased knowledge of use of DME or AE;Cardiopulmonary status limiting activity OT Treatment Interventions: Self-care/ADL training;DME and/or AE instruction;Patient/family education;Balance training;Cognitive remediation/compensation;Therapeutic activities   OT Goals Acute Rehab OT Goals OT Goal Formulation: Patient unable to participate in goal setting Time For Goal Achievement: 03/02/13 Potential to Achieve Goals: Good ADL Goals Pt Will Perform Grooming: with min assist;Standing at sink ADL Goal: Grooming - Progress: Goal set today Pt Will Perform Upper Body Bathing: with min assist;Sitting, chair ADL Goal: Upper Body Bathing - Progress: Goal set today Pt Will Perform Lower Body Bathing: with min assist;Sit to stand from chair;Sit to stand from bed ADL Goal: Lower Body Bathing - Progress: Goal set today Pt Will Perform Lower Body Dressing: with min assist;Sit to stand from chair;Sit to stand from bed ADL Goal: Lower Body Dressing - Progress: Goal set today Additional ADL Goal #1: Pt will be oriented x 4 with min cues and use of external cues ADL Goal: Additional Goal #1 - Progress: Goal set today  Visit Information  Last OT Received On: 02/16/13 Assistance Needed: +2    Subjective Data  Subjective: I'm in [place] Patient Stated Goal: Pt unable   Prior Functioning  Home Living Additional Comments: Pt unable to give any information. Prior Function Comments: Pt unabel to provide info and  no family present Communication Communication: Tracheostomy;HOH         Vision/Perception     Cognition  Cognition Arousal/Alertness: Awake/alert Behavior During Therapy: Restless Overall Cognitive Status: Impaired/Different from baseline Area of Impairment: Orientation;Attention;Following commands;Safety/judgement;Awareness Orientation Level: Disoriented to;Place;Time;Situation Current Attention Level: Sustained Following Commands: Follows one step commands consistently Safety/Judgement: Decreased awareness of safety;Decreased awareness of deficits General Comments: Pt impulsive with no awareness of deficits  Difficult to assess due to: Tracheostomy    Extremity/Trunk Assessment Right Upper Extremity Assessment RUE ROM/Strength/Tone: Within functional levels RUE Coordination: WFL - gross/fine motor Left Upper Extremity Assessment LUE ROM/Strength/Tone: Within functional levels LUE Coordination: WFL - gross/fine motor     Mobility Bed Mobility Bed Mobility: Supine to Sit Supine to Sit: 6: Modified independent (Device/Increase time) Details for Bed Mobility Assistance: Pt moved to EOB with restraints in place     Exercise     Balance     End of Session OT - End of Session Activity Tolerance: Other (comment) (limited by cognition and safety issues) Patient left: in bed;with call bell/phone within reach;with bed alarm set;with restraints reapplied  GO     Maricia Scotti M 02/16/2013, 3:29 PM

## 2013-02-16 NOTE — Progress Notes (Signed)
2 RNs attempted to replace NGT, unsuccessful. MD Molli Knock notified. No new orders at this time.

## 2013-02-16 NOTE — Progress Notes (Addendum)
Pt bed alarm going off. Walk in room to find pt with legs over side of bed, NG removed.  MD Molli Knock notified.  MD says to wait for Speech to do swallow eval before replacing NG. Will replace if fails eval. Will cont to monitor. Daryan Cagley L

## 2013-02-16 NOTE — Progress Notes (Signed)
Pt does not have capacity to answer remaining admission questions. Zachary Walls

## 2013-02-16 NOTE — Progress Notes (Signed)
PULMONARY  / CRITICAL CARE MEDICINE  Name: Zachary Walls MRN: [id number] DOB: 02-09-1956    ADMISSION DATE:  02/05/2013 CONSULTATION DATE:  40981  REFERRING MD :  Dr. Ignacia Palma PRIMARY SERVICE: PCCM  CHIEF COMPLAINT:  AMS  BRIEF PATIENT DESCRIPTION: Pt was last seen normal 4/16 at 4pm. Son found pt down in fetal position complaining of breathing. Pt had suspicious visitor that may have given pt drugs. 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 Pt continues to be altered and PCCM asked to manage.   SIGNIFICANT EVENTS / STUDIES:  4/17- AMS, admitted to ICU 4/18 - cardiac arrest with 7 minutes of CPR, emergent intubation, on pressor 4/18- eeg -slow, no focus 4/18 echo - 65%, WNL, euvolemic 4/19- clinical findings concerning seizures, keppra, more alert 4/22 MRI brain: Negative for acute infract. No acute intracranial abnormality 4/24 pt failed extubation unable to protect airway reintubated  LINES / TUBES: 4/18 ETT>>>4/24>>4/24 4/18 LIJ>>4/24 4/18 Foley>> (failed extraction 2/2 residual)  CULTURES: 4/17 BCx>>>neg  4/17 resp Cx>>neg 4/17 UCx>>neg 4/18 cdiff>>>neg 4/24 BCx>>> 4/24 UCx>>> 4/24 Resp Cx >>>  ANTIBIOTICS: 4/17 IV zosyn>>>4/20 4/18 unasyn>>>  4/23 4/17 Vanc >>>4/20  SUBJECTIVE: Awake, interactive and angry.   VITAL SIGNS: Temp:  [98.8 F (37.1 C)-101.3 F (38.5 C)] 98.9 F (37.2 C) (04/28 0800) Pulse Rate:  [70-95] 74 (04/28 0800) Resp:  [13-26] 23 (04/28 0800) BP: (103-146)/(56-89) 120/57 mmHg (04/28 0800) SpO2:  [95 %-100 %] 95 % (04/28 0800) FiO2 (%):  [35 %-40 %] 40 % (04/28 0800) Weight:  [48 kg (105 lb 13.1 oz)] 48 kg (105 lb 13.1 oz) (04/28 0100) HEMODYNAMICS: CV stable   VENTILATOR SETTINGS: Vent Mode:  [-] PCV FiO2 (%):  [35 %-40 %] 40 % Set Rate:  [12 bmp] 12 bmp PEEP:  [5 cmH20] 5 cmH20 Plateau Pressure:  [23 cmH20-31 cmH20] 23 cmH20 INTAKE / OUTPUT: Intake/Output     04/27 0701 - 04/28 0700 04/28 0701 -  04/29 0700   I.V. (mL/kg)     NG/GT 2080 60   Total Intake(mL/kg) 2080 (43.3) 60 (1.3)   Urine (mL/kg/hr) 1650 (1.4) 350 (3.4)   Total Output 1650 350   Net +430 -290         PHYSICAL EXAMINATION: General: arousable male in nad, agitated. Neuro:  Not following commands, somewhat arousable HEENT:  Nose without purulence or d/c noted.  Trach in place Cardiovascular:  Mild tachy but regular Lungs:  BS distant, a few basilar crackles, no wheezing Abdomen:  Colostomy with brown stool drainage, no r/g LE without edema, no cyanosis  LABS:  Recent Labs Lab 02/11/13 0401  02/12/13 0400 02/13/13 0035  02/14/13 0555 02/14/13 1933 02/14/13 2045 02/14/13 2124 02/15/13 0143 02/15/13 0144 02/15/13 1013  HGB 10.7*  --  10.7*  --   --   --   --   --  10.0* 9.8*  --   --   WBC 9.9  --  10.0  --   --   --   --   --  16.7* 15.9*  --   --   PLT 210  --  199  --   --   --   --   --  258 241  --   --   NA 147*  --  151*  --   < > 153*  --   --  153* 150*  --   --   K 3.5  --  3.1*  --   < > 3.4*  --   --  3.5 3.3*  --   --   CL 111  --  112  --   < > 115*  --   --  115* 111  --   --   CO2 30  --  32  --   < > 30  --   --  32 31  --   --   GLUCOSE 142*  --  102*  --   < > 109*  --   --  106* 108*  --   --   BUN 30*  --  29*  --   < > 25*  --   --  27* 27*  --   --   CREATININE 1.06  --  1.15  --   < > 1.11  --   --  1.24 1.21  --   --   CALCIUM 8.8  --  9.2  --   < > 9.4  --   --  9.2 9.2  --   --   MG  --   --   --   --   --   --   --   --  1.9  --   --   --   AST  --   --   --   --   --   --   --   --  96*  --   --   --   ALT  --   --   --   --   --   --   --   --  23*  --   --   --   ALKPHOS  --   --   --   --   --   --   --   --  43  --   --   --   BILITOT  --   --   --   --   --   --   --   --  0.3  --   --   --   PROT  --   --   --   --   --   --   --   --  5.7*  --   --   --   ALBUMIN  --   --   --   --   --   --   --   --  2.3*  --   --   --   APTT  --   --  28  --   --   --   --    --   --   --   --   --   INR  --   --  1.05  --   --   --   --   --   --   --   --   --   TROPONINI  --   --   --   --   --   --   --   --  0.38*  --  0.41* <0.30  PHART  --   < >  --  7.521*  --   --  7.477* 7.438  --   --   --   --   PCO2ART  --   < >  --  33.4*  --   --  42.1 42.7  --   --   --   --  PO2ART  --   < >  --  124.0*  --   --  143.0* 85.2  --   --   --   --   < > = values in this interval not displayed.  Recent Labs Lab 02/15/13 0433 02/15/13 0728 02/15/13 1210 02/15/13 1544 02/15/13 2040  GLUCAP 107* 87 154* 103* 85   CXR: no film 4/26  ASSESSMENT / PLAN: Principal Problem:   Cardiac arrest Active Problems:   COPD (chronic obstructive pulmonary disease)   Severe protein-calorie malnutrition   Altered mental status   Anoxic encephalopathy   Respiratory failure, acute and chronic  PULMONARY A: Severe emphysema with bullae; COPD      VDRF- emergent intubation following cardiac arrest R/o asp was treated 5 days of unasyn>> 4/24 S/p trach 4/24 He is tolerating TC well, will attempt to decrease to 28% FiO2 and keep off vent as tolerated.  P:   - Duonebs  q6h. - On tapering prednisone, currently on 20 mg daily, will decrease in AM to 10 and maintain for 5 days and drop to 5 for 5 days then d/c. - TC as tolerated, attempt to get to 28%.   CARDIOVASCULAR A:  Hx of HTN  Cardiac arrest with 7 min of CPR on 4/18 Shock, post arrest -resolved Tachycardia and HTN  Likely 2/2 alcohol withdrawal?  P:  - Cont bisoprolol.  GASTROINTESTINAL A:  emergent Hartmann's procedure 3/13 2/2 SBO with colostomy non-revised by surgery 2/2 poor nutrition and overall medical status - CT abdomen with no intraabdominal process   Elevated Transaminases -AST elevated likely 2/2 alcohol - improving -4/21 P:   - PPI. - Continue TF. - Depending on progression from a respiratory standpoint will determine need for PEG. - Swallow evaluation.  ENDOCRINE A:  No active issues P:   -  SSI.  NEUROLOGIC A:  AMS( likely delirium) from EtOH withdrawal vs opidio w/d vs. possible anoxic brain injury ( s/p cardiac arrest) that can not be ruled out.  CT head was negative.  MRI ( 4/22)- Negative for acute infract  EEG- slowing but no focal disease, seizure seems less likely P:   - Cont-fentanyl 100 mcg patch q72h - Cont seroquel 50mg  AM and 100mg  QHS - Neuro following along, appreciate input. - Continue IV Thiamine and folic acid daily  Alyson Reedy, M.D. [facility] Pulmonary/Critical Care Medicine. Pager: [phone]. After hours pager: [phone].

## 2013-02-16 NOTE — Progress Notes (Signed)
MD Vassie Loll notified of failed attempts at reinserting NGT. Told to hold per tube meds tonight and reevaluate in am. Lorren Rossetti L

## 2013-02-17 ENCOUNTER — Inpatient Hospital Stay (HOSPITAL_COMMUNITY): Payer: Medicaid Other

## 2013-02-17 LAB — BASIC METABOLIC PANEL
CO2: 26 mEq/L (ref 19–32)
Calcium: 9.6 mg/dL (ref 8.4–10.5)
Glucose, Bld: 94 mg/dL (ref 70–99)
Sodium: 147 mEq/L — ABNORMAL HIGH (ref 135–145)

## 2013-02-17 LAB — CBC
Hemoglobin: 11.6 g/dL — ABNORMAL LOW (ref 13.0–17.0)
MCH: 30.6 pg (ref 26.0–34.0)
MCV: 90.2 fL (ref 78.0–100.0)
RBC: 3.79 MIL/uL — ABNORMAL LOW (ref 4.22–5.81)

## 2013-02-17 LAB — GLUCOSE, CAPILLARY
Glucose-Capillary: 74 mg/dL (ref 70–99)
Glucose-Capillary: 90 mg/dL (ref 70–99)

## 2013-02-17 LAB — PHOSPHORUS: Phosphorus: 3.7 mg/dL (ref 2.3–4.6)

## 2013-02-17 MED ORDER — LEVOFLOXACIN IN D5W 750 MG/150ML IV SOLN
750.0000 mg | INTRAVENOUS | Status: DC
  Administered 2013-02-17 – 2013-02-19 (×3): 750 mg via INTRAVENOUS
  Filled 2013-02-17 (×4): qty 150

## 2013-02-17 MED ORDER — POTASSIUM CHLORIDE 10 MEQ/100ML IV SOLN: 10.0000 meq | INTRAVENOUS | Status: DC

## 2013-02-17 MED ORDER — POTASSIUM CHLORIDE 10 MEQ/100ML IV SOLN
10.0000 meq | INTRAVENOUS | Status: AC
  Administered 2013-02-17 (×2): 10 meq via INTRAVENOUS

## 2013-02-17 MED ORDER — POTASSIUM CHLORIDE 10 MEQ/100ML IV SOLN
INTRAVENOUS | Status: AC
  Filled 2013-02-17: qty 200

## 2013-02-17 NOTE — Progress Notes (Signed)
Physical Therapy Treatment Patient Details Name: Zachary Walls MRN: [id number] DOB: Feb 27, 1956 Today's Date: 02/17/2013 Time: 4098-1191 PT Time Calculation (min): 32 min  PT Assessment / Plan / Recommendation Comments on Treatment Session  Pt progressing well but remains to be at increased falls risk, decreased safety awareness, and decreased attn span. Pt con't to benefit from SNF placement upon d/c to maximize functional recovery to achieve safe mod I function prior to transition home.    Follow Up Recommendations  SNF     Does the patient have the potential to tolerate intense rehabilitation     Barriers to Discharge        Equipment Recommendations       Recommendations for Other Services    Frequency Min 3X/week   Plan Discharge plan remains appropriate;Frequency needs to be updated    Precautions / Restrictions Precautions Precautions: Fall Precaution Comments: trach collar, bilat mittens & soft restraints Restrictions Weight Bearing Restrictions: No   Pertinent Vitals/Pain Pt denies pain at this time    Mobility  Bed Mobility Bed Mobility: Supine to Sit Supine to Sit: 4: Min assist Details for Bed Mobility Assistance: verbal and tactile cues to stay on task Transfers Transfers: Sit to Stand;Stand to Sit Sit to Stand: 3: Mod assist;With upper extremity assist;From bed Stand to Sit: 4: Min assist;With upper extremity assist;To chair/3-in-1 Details for Transfer Assistance: max directional v/c's for safety and hand placement Ambulation/Gait Ambulation/Gait Assistance: 4: Min assist (2nd person for lines/chair follow) Ambulation Distance (Feet): 150 Feet Assistive device: Rolling walker Ambulation/Gait Assistance Details: pt impulsive, max directional v/c's for walker management and to stay on task. Pt impulsive. Pt with mild ataxia but could also be decreased attn span and inability to focus on task at hand. Gait Pattern: Step-through pattern;Scissoring;Ataxic;Narrow  base of support Gait velocity: wfl Stairs: No    Exercises     PT Diagnosis:    PT Problem List:   PT Treatment Interventions:     PT Goals Acute Rehab PT Goals PT Goal: Supine/Side to Sit - Progress: Progressing toward goal PT Goal: Sit to Supine/Side - Progress: Progressing toward goal PT Goal: Sit to Stand - Progress: Progressing toward goal PT Goal: Stand to Sit - Progress: Progressing toward goal PT Goal: Ambulate - Progress: Progressing toward goal  Visit Information  Last PT Received On: 02/17/13 Assistance Needed: +2 (for lines)    Subjective Data  Subjective: Pt received supine in bed agreeable to PT.    Cognition  Cognition Arousal/Alertness: Awake/alert Overall Cognitive Status: Impaired/Different from baseline Area of Impairment: Attention;Safety/judgement Current Attention Level: Sustained Following Commands: Follows one step commands consistently Safety/Judgement: Decreased awareness of safety    Balance  Static Sitting Balance Static Sitting - Balance Support: No upper extremity supported Static Sitting - Level of Assistance: 5: Stand by assistance Static Sitting - Comment/# of Minutes: 5 min  End of Session PT - End of Session Equipment Utilized During Treatment: Gait belt;Oxygen (28%) Activity Tolerance: Patient tolerated treatment well Patient left: in chair;with call bell/phone within reach;with nursing in room (mitten donned back on per RN request) Nurse Communication: Mobility status   GP     Marcene Brawn 02/17/2013, 5:26 PM  Lewis Shock, PT, DPT Pager #: [phone] Office #: [phone]

## 2013-02-17 NOTE — Progress Notes (Signed)
SPEECH LANGUAGE PATHOLOGY Passy-Muir speaking valve and FEES evals completed this a.m.  Pt with poor airway protection with secretions;  + aspiration with POs .  Will need IR for NG replacement per Dr. Molli Knock.  Full reports to follow.    Ahriana Gunkel L. Samson Frederic, [place] CCC/SLP Pager [phone]

## 2013-02-17 NOTE — Progress Notes (Signed)
eLink Physician-Brief Progress Note Patient Name: Zachary Walls DOB: [birth year] MRN: [id number]  Date of Service  02/17/2013   HPI/Events of Note     eICU Interventions  Bilateral wrist restraints renewed for patient safety   Intervention Category Minor Interventions: Agitation / anxiety - evaluation and management  Anisa Leanos 02/17/2013, 12:48 AM

## 2013-02-17 NOTE — Progress Notes (Signed)
NUTRITION FOLLOW UP  Intervention:    Continue Osmolite 1.2 rate to 60 ml/h with Prostat 30 ml once daily to increase intake to 1828 kcals, 95 gm protein, 1181 ml free water daily.  Pt progressing slowly with PMSV and PO diet.  Question whether pt will need long-term nutrition support including PEG.  Pt trach dependent and is not able to meet needs PO based on performance with SLP.  Pt pulled NGT yesterday- requiring replacement in IR today.  Per RN this is 4-5th NGT pt has pulled.   Nutrition Dx:   Inadequate oral intake related to inability to eat as evidenced by NPO status. Ongoing.  Goal:   Intake to meet >90% of estimated nutrition needs, met.  Monitor:   TF tolerance/adequacy, weight trend, labs, vent status.  Assessment:   Patient failed extubation and required a trach on 4/24.  Patient remains on ventilator support.  MV: 9.6 Temp:Temp (24hrs), Avg:98.8 F (37.1 C), Min:97.8 F (36.6 C), Max:99.9 F (37.7 C)  Pt pulled NGT yesterday and replacement was held for PMSV and FEES evals with SLP this am.  Pt with poor airway protection and aspiration.   Patient previously tolerating TFs well, however has not received since pulling NGT yesterday.  Orders remain once accessed obtained.   Discussed with RN who reports pt's neck/throat is inflammed and once resolved may perform better with POs.   Height: Ht Readings from Last 1 Encounters:  02/14/13 5\' 7"  (1.702 m)    Weight Status:  stable Wt Readings from Last 1 Encounters:  02/17/13 110 lb 14.3 oz (50.3 kg)  02/09/13  118 lb 13.3 oz (53.9 kg) 02/06/13  117 lb 1 oz (53.1 kg)   Body mass index is 17.36 kg/(m^2). Underweight   Re-estimated needs:  Kcal: 1610-9604 Protein: 80-100 gm Fluid: 1.8-1.9 L  Skin: intact  Diet Order: NPO  TF Order: Osmolite 1.2 at 50 ml/h with Prostat 30 ml BID for a total intake of 1640 kcals, 97 gm protein, 984 ml free water daily. Free water flushes: 250 ml every 8 hours MVI via tube  daily.    Intake/Output Summary (Last 24 hours) at 02/17/13 1118 Last data filed at 02/17/13 0958  Gross per 24 hour  Intake    160 ml  Output   1800 ml  Net  -1640 ml    Last BM: 4/29 (colostomy)   Labs:   Recent Labs Lab 02/14/13 2124 02/15/13 0143 02/16/13 0805 02/17/13 0645  NA 153* 150* 147* 147*  K 3.5 3.3* 3.2* 3.3*  CL 115* 111 110 108  CO2 32 31 30 26   BUN 27* 27* 28* 28*  CREATININE 1.24 1.21 1.07 1.01  CALCIUM 9.2 9.2 9.2 9.6  MG 1.9  --   --  2.3  PHOS  --   --   --  3.7  GLUCOSE 106* 108* 133* 94    CBG (last 3)   Recent Labs  02/17/13 0052 02/17/13 0416 02/17/13 0723  GLUCAP 74 85 83    Scheduled Meds: . albuterol  2.5 mg Nebulization Q6H  . antiseptic oral rinse  15 mL Mouth Rinse QID  . bisoprolol  20 mg Oral Daily  . chlorhexidine  15 mL Mouth Rinse BID  . feeding supplement  30 mL Per Tube Q1200  . fentaNYL  100 mcg Transdermal Q72H  . folic acid  1 mg Per Tube Daily  . free water  250 mL Per Tube Q6H  . heparin  5,000 Units  Subcutaneous Q8H  . insulin aspart  0-9 Units Subcutaneous Q4H  . ipratropium  0.5 mg Nebulization Q6H  . multivitamin  5 mL Per Tube Daily  . pantoprazole sodium  40 mg Per Tube Q1200  . potassium chloride  10 mEq Intravenous Q1 Hr x 2  . predniSONE  20 mg Per Tube Q breakfast  . QUEtiapine  100 mg Oral QHS  . QUEtiapine  50 mg Oral q morning - 10a  . thiamine  100 mg Oral Daily    Continuous Infusions: . dextrose 50 mL (02/13/13 1252)  . feeding supplement (OSMOLITE 1.2 CAL) 1,000 mL (02/15/13 1438)    Loyce Dys, MS RD LDN Clinical Inpatient Dietitian Pager: [phone] Weekend/After hours pager: [phone]
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
## 2013-02-17 NOTE — Progress Notes (Signed)
Clinical Social Worker left voicemail messages with pt's son and dtr requesting a return call to review bed offers.  At this time, pt has only one bed offer-[facility].  Pt currently out of room in a procedure.    Angelia Mould, MSW, [place] [phone]

## 2013-02-17 NOTE — Evaluation (Signed)
Passy-Muir Speaking Valve - Evaluation Patient Details  Name: Zachary Walls MRN: [id number] Date of Birth: 04-22-1956  Today's Date: 02/17/2013 Time: 0930-1000 SLP Time Calculation (min): 30 min  Past Medical History:  Past Medical History  Diagnosis Date  . COPD (chronic obstructive pulmonary disease)   . Pneumonia   . Hypertension   . HOH (hard of hearing) 12/01/2011   Past Surgical History:  Past Surgical History  Procedure Laterality Date  . Hernia repair      inguinal  . 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 Colostomy revision  12/11/2011    Procedure: COLON RESECTION SIGMOID;  Surgeon: Emelia Loron, MD;  Location: [facility];  Service: General;;  . Bowel resection  12/11/2011    Procedure: SMALL BOWEL RESECTION;  Surgeon: Emelia Loron, MD;  Location: [facility];  Service: General;;  times 2  . Cystoscopy w/ ureteral stent placement  12/11/2011    Procedure: CYSTOSCOPY WITH STENT REPLACEMENT;  Surgeon: Anner Crete, MD;  Location: [facility];  Service: Urology;  Laterality: Left;  ureteral catheter placement   HPI:  57 y.o. male with significant ETOH abuse history was found at home in fetal position with difficulty breathing.  Pt with cardiac arrest 4/18 with 7 mins of CPR and emergent intubation. VDRF 4/18-4/24.  Trach 4/24. MRI neg for acute infarct.     Assessment / Plan / Recommendation Clinical Impression  Pt evaluated for potential for PMSV in preparation for swallow assessment.  Currently with #6 cuffed trach. Copious, frothy secretions expectorated orally and tracheally; cuff deflated and RN provided tracheal suctioning.  Pt achieved hoarse phonation; quality improved as session progressed.  Sp02 remained at 97%; HR and RR were stable throughout use of valve.  Occasional removal of valve required in order to suction secretions from around trach.  Recommend use of PMV only under  the direct supervision of RN and staff at this time.  Cuff MUST BE deflated prior to valve placement to ensure ventilatory exchange.  SLP will follow for goals.     SLP Assessment  Patient needs continued Speech Language Pathology Services    Follow Up Recommendations  Other (comment) (tba)    Frequency and Duration min 3x week  2 weeks   Pertinent Vitals/Pain No pain    SLP Goals Potential to Achieve Goals: Good Potential Considerations: Ability to learn/carryover information Progress/Goals/Alternative treatment plan discussed with pt/caregiver and they: Agree SLP Goal #1: Pt will tolerate PMSV for 30-60 minute durations with stable vital signs SLP Goal #2: Pt will achieve oral expectoration with use of valve 100% of trials.   PMSV Trial  PMSV was placed for: 30 minutes Able to redirect subglottic air through upper airway: Yes Able to Attain Phonation: Yes Voice Quality: Hoarse Able to Expectorate Secretions: Yes Level of Secretion Expectoration with PMSV: Tracheal;Oral Breath Support for Phonation: Mildly decreased Intelligibility: Intelligible Respirations During Trial: 20 SpO2 During Trial: 100 % Behavior: Alert;Cooperative;Confused   Tracheostomy Tube  Additional Tracheostomy Tube Assessment Trach Collar Period: 24 hours Secretion Description: copious; frothy Level of Secretion Expectoration: Tracheal;Oral    Vent Dependency  Vent Dependent: No FiO2 (%): 28 %    Cuff Deflation Trial Tolerated Cuff Deflation: Yes Length of Time for Cuff Deflation Trial: 30 min Behavior: Alert;Confused   [name] 02/17/2013, 12:46 PM

## 2013-02-17 NOTE — Progress Notes (Signed)
Called to set up time for NGT placement per [facility] request. Pt failed bedside swallow and FEES study. Zachary Walls

## 2013-02-17 NOTE — Progress Notes (Signed)
Molli Knock notified about resp culture results. Pharmacy notified about dosing Levaquin IV. Radiology notified RN that they will replace NGT around 1400. MD Molli Knock aware. Will cont to monitor pt.  Eular Panek L

## 2013-02-17 NOTE — Procedures (Signed)
Objective Swallowing Evaluation: Fiberoptic Endoscopic Evaluation of Swallowing  Patient Details  Name: Zachary Walls MRN: [id number] Date of Birth: May 19, 1956  Today's Date: 02/17/2013 Time: 1010-1040 SLP Time Calculation (min): 30 min  Past Medical History:  Past Medical History  Diagnosis Date  . COPD (chronic obstructive pulmonary disease)   . Pneumonia   . Hypertension   . HOH (hard of hearing) 12/01/2011   Past Surgical History:  Past Surgical History  Procedure Laterality Date  . Hernia repair      inguinal  . 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 Colostomy revision  12/11/2011    Procedure: COLON RESECTION SIGMOID;  Surgeon: Emelia Loron, MD;  Location: [facility];  Service: General;;  . Bowel resection  12/11/2011    Procedure: SMALL BOWEL RESECTION;  Surgeon: Emelia Loron, MD;  Location: [facility];  Service: General;;  times 2  . Cystoscopy w/ ureteral stent placement  12/11/2011    Procedure: CYSTOSCOPY WITH STENT REPLACEMENT;  Surgeon: Anner Crete, MD;  Location: [facility];  Service: Urology;  Laterality: Left;  ureteral catheter placement   HPI:  57 y.o. male with significant ETOH abuse history was found at home in fetal position with difficulty breathing.  Pt with cardiac arrest 4/18 with 7 mins of CPR and emergent intubation.  VDRF 4/17-4/24; Trach 4/24 (#6 cuffed).  ATC 24 hours.  Pt has pulled NG multiple times; currently with restraints; asking for coffee.  Tolerated PMSV and deemed appropriate for FEES.   Study completed with cuff deflated and PMSV in place.    Assessment / Plan / Recommendation Clinical Impression  Dysphagia Diagnosis: Severe pharyngeal phase dysphagia  Clinical impression: Pt presents with severe dysphagia.  Larynx with edema throughout.  Copious, frothy secretions were present in laryngeal vestibule throughout exam.  Pt achieved delayed swallow  response; piecemeal bolusing of all POs observed with frequent, repetitive expectoration from pharynx to oral cavity.  Residuals in pharynx mixed with secretions, with subsequent penetration and/or aspiration.  Minimal UES patency noted, likely due to edema.  Pt not ready for POs - high aspiration risk.   Rec continued NPO status.  SLP to follow for PMSV and readiness to begin oral diet.          Treatment Recommendation  Therapy as outlined in treatment plan below    Diet Recommendation NPO;Alternative means - temporary        Other  Recommendations     Follow Up Recommendations  Other (comment) (tba)    Frequency and Duration min 3x week  2 weeks   Pertinent Vitals/Pain No pain    SLP Swallow Goals Patient will utilize recommended strategies during swallow to increase swallowing safety with: Minimal assistance Goal #3: Pt will initiate a swallow response within  5-10 seconds of PO presentation on 80% of trials.   General Date of Onset: 02/05/13 HPI: 57 y.o. male with significant ETOH abuse history was found at home in fetal position with difficulty breating.  Pt with cardiac arrest 4/18 with 7 mins of CPR and emergent intubation.  VDRF 4/17-4/24; Trach 4/24 (#6 cuffed).  ATC 24 hours.  Pt has pulled NG multiple times; currently with restraints; asking for coffee.  Tolerated PMSV and deemed appropriate for FEES.    Type of Study: Fiberoptic Endoscopic Evaluation of Swallowing Reason for Referral: Objectively evaluate swallowing function Diet Prior to this Study: NPO Temperature  Spikes Noted: No Respiratory Status: Trach Trach Size and Type: Cuff;#6;With PMSV in place History of Recent Intubation: Yes Length of Intubations (days): 6 days Date extubated: 02/12/13 Behavior/Cognition: Alert;Confused;Impulsive;Hard of hearing (wears hearing aid right ear) Oral Cavity - Dentition: Poor condition Oral Motor / Sensory Function: Within functional limits Self-Feeding Abilities: Needs  assist Patient Positioning: Upright in bed Baseline Vocal Quality: Hoarse Volitional Cough: Strong Volitional Swallow: Able to elicit Anatomy: Other (Comment) (edema throughout larynx; no erythema; ) Pharyngeal Secretions: Standing secretions in (comment)    Reason for Referral Objectively evaluate swallowing function   Oral Phase Oral Preparation/Oral Phase Oral Phase: WFL   Pharyngeal Phase Pharyngeal Phase Pharyngeal Phase: Impaired Pharyngeal - Nectar Pharyngeal - Nectar Teaspoon: Premature spillage to valleculae;Reduced airway/laryngeal closure;Penetration/Aspiration after swallow;Pharyngeal residue - pyriform sinuses;Pharyngeal residue - posterior pharnyx;Pharyngeal residue - cp segment Penetration/Aspiration details (nectar teaspoon): Material enters airway, remains ABOVE vocal cords and not ejected out Pharyngeal - Thin Pharyngeal - Ice Chips: Premature spillage to valleculae;Reduced airway/laryngeal closure;Penetration/Aspiration after swallow;Pharyngeal residue - pyriform sinuses;Pharyngeal residue - posterior pharnyx;Pharyngeal residue - cp segment;Penetration/Aspiration during swallow Penetration/Aspiration details (ice chips): Material enters airway, remains ABOVE vocal cords and not ejected out Pharyngeal - Thin Teaspoon: Premature spillage to valleculae;Reduced airway/laryngeal closure;Pharyngeal residue - pyriform sinuses;Pharyngeal residue - posterior pharnyx;Pharyngeal residue - cp segment;Penetration/Aspiration before swallow Penetration/Aspiration details (thin teaspoon): Material enters airway, passes BELOW cords without attempt by patient to eject out (silent aspiration) Pharyngeal - Solids Pharyngeal - Puree: Premature spillage to valleculae;Reduced airway/laryngeal closure;Penetration/Aspiration after swallow;Pharyngeal residue - pyriform sinuses;Pharyngeal residue - posterior pharnyx;Pharyngeal residue - cp segment Penetration/Aspiration details (puree): Material  enters airway, CONTACTS cords then ejected out  Cervical Esophageal Phase    GO   Cassity Christian L. Samson Frederic, [place] CCC/SLP Pager [phone]            [name] 02/17/2013, 1:12 PM

## 2013-02-18 ENCOUNTER — Encounter (HOSPITAL_COMMUNITY): Payer: Self-pay | Admitting: Radiology

## 2013-02-18 LAB — BASIC METABOLIC PANEL
CO2: 33 mEq/L — ABNORMAL HIGH (ref 19–32)
Chloride: 108 mEq/L (ref 96–112)
Glucose, Bld: 117 mg/dL — ABNORMAL HIGH (ref 70–99)
Sodium: 146 mEq/L — ABNORMAL HIGH (ref 135–145)

## 2013-02-18 LAB — CBC
HCT: 29 % — ABNORMAL LOW (ref 39.0–52.0)
Hemoglobin: 10.2 g/dL — ABNORMAL LOW (ref 13.0–17.0)
MCV: 88.7 fL (ref 78.0–100.0)
RBC: 3.27 MIL/uL — ABNORMAL LOW (ref 4.22–5.81)
WBC: 8 10*3/uL (ref 4.0–10.5)

## 2013-02-18 LAB — GLUCOSE, CAPILLARY
Glucose-Capillary: 135 mg/dL — ABNORMAL HIGH (ref 70–99)
Glucose-Capillary: 122 mg/dL — ABNORMAL HIGH (ref 70–99)
Glucose-Capillary: 134 mg/dL — ABNORMAL HIGH (ref 70–99)
Glucose-Capillary: 92 mg/dL (ref 70–99)

## 2013-02-18 MED ORDER — POTASSIUM CHLORIDE 20 MEQ/15ML (10%) PO LIQD
40.0000 meq | Freq: Three times a day (TID) | ORAL | Status: AC
  Administered 2013-02-18: 40 meq
  Filled 2013-02-18: qty 30

## 2013-02-18 MED ORDER — FUROSEMIDE 10 MG/ML IJ SOLN
40.0000 mg | Freq: Once | INTRAMUSCULAR | Status: AC
  Administered 2013-02-18: 40 mg via INTRAVENOUS
  Filled 2013-02-18: qty 4

## 2013-02-18 MED ORDER — CEFAZOLIN SODIUM 1-5 GM-% IV SOLN
1.0000 g | INTRAVENOUS | Status: AC
  Administered 2013-02-19: 1 g via INTRAVENOUS
  Filled 2013-02-18: qty 50

## 2013-02-18 NOTE — Progress Notes (Signed)
Passy-Muir Speaking Valve - Treatment Patient Details  Name: Zachary Walls MRN: [id number] Date of Birth: Mar 07, 1956  Today's Date: 02/18/2013 Time: 1109-1120 SLP Time Calculation (min): 11 min  Past Medical History:  Past Medical History  Diagnosis Date  . COPD (chronic obstructive pulmonary disease)   . Pneumonia   . Hypertension   . HOH (hard of hearing) 12/01/2011   Past Surgical History:  Past Surgical History  Procedure Laterality Date  . Hernia repair      inguinal  . 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 Colostomy revision  12/11/2011    Procedure: COLON RESECTION SIGMOID;  Surgeon: Emelia Loron, MD;  Location: [facility];  Service: General;;  . Bowel resection  12/11/2011    Procedure: SMALL BOWEL RESECTION;  Surgeon: Emelia Loron, MD;  Location: [facility];  Service: General;;  times 2  . Cystoscopy w/ ureteral stent placement  12/11/2011    Procedure: CYSTOSCOPY WITH STENT REPLACEMENT;  Surgeon: Anner Crete, MD;  Location: [facility];  Service: Urology;  Laterality: Left;  ureteral catheter placement    Assessment / Plan / Recommendation Clinical Impression  Pt. seen for brief session with PMSV.  He tolerated valve placement for 10 minute session with mod-max verbal cues to utilize speech strategies.  Intelligibility in phrases was decreased as pt. needs to decrease rate of speech.  All vital signs stable.  ST will continue to increase speech intelligibility with PMSV as well as dysphagia facilitation.    Plan  Continue with current plan of care    Follow Up Recommendations   (tbd)    Pertinent Vitals/Pain none    SLP Goals Potential to Achieve Goals: Good Potential Considerations: Ability to learn/carryover information SLP Goal #1: Pt will tolerate PMSV for 30-60 minute durations with stable vital signs SLP Goal #1 - Progress: Progressing toward goal SLP Goal #2: Pt  will achieve oral expectoration with use of valve 100% of trials. SLP Goal #2 - Progress: Progressing toward goal   PMSV Trial  PMSV was placed for: 10 min Able to redirect subglottic air through upper airway: Yes Able to Attain Phonation: Yes Voice Quality: Hoarse Able to Expectorate Secretions: Yes Level of Secretion Expectoration with PMSV: Oral;Tracheal Breath Support for Phonation: Mildly decreased Intelligibility: Intelligibility reduced Word: 75-100% accurate Phrase: 75-100% accurate Respirations During Trial: 25 SpO2 During Trial: 98 % Pulse During Trial: 78   Tracheostomy Tube       Vent Dependency  FiO2 (%): 28 %    Cuff Deflation Trial  GO     Tolerated Cuff Deflation:  (cuff deflated on ST arrival) Behavior: Alert;Responsive to questions   Royce Macadamia M.Ed [employer] [phone]  02/18/2013

## 2013-02-18 NOTE — Progress Notes (Signed)
In room on rounds..noted feeding tub out. Scheduled for PEG placement tomorrow. Pt with restraints and mittens bil. Dr. Vassie Loll notified; meds verified and ok to leave out.

## 2013-02-18 NOTE — H&P (Signed)
HPI: Zachary Walls is an 57 y.o. male who has been admitted with cardiac arrest and resp failure. He also has COPD, PCM, and hx of EtOH abuse. He has now had tracheostomy placed, but failed a swallow study. IR is now requested to place G-tube for long term enteral nutrition needs. Chart, PMHx, and meds reviewed. No acute or ongoing infectious issues. Pt with hx of prior sigmoid colectomy with end colostomy and Partial SB resection in 2013. He had a Stamm gastrostomy placed at that time and it was removed a few months later.  Past Medical History:  Past Medical History  Diagnosis Date  . COPD (chronic obstructive pulmonary disease)   . Pneumonia   . Hypertension   . HOH (hard of hearing) 12/01/2011    Past Surgical History:  Past Surgical History  Procedure Laterality Date  . Hernia repair      inguinal  . 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 Colostomy revision  12/11/2011    Procedure: COLON RESECTION SIGMOID;  Surgeon: Emelia Loron, MD;  Location: [facility];  Service: General;;  . Bowel resection  12/11/2011    Procedure: SMALL BOWEL RESECTION;  Surgeon: Emelia Loron, MD;  Location: [facility];  Service: General;;  times 2  . Cystoscopy w/ ureteral stent placement  12/11/2011    Procedure: CYSTOSCOPY WITH STENT REPLACEMENT;  Surgeon: Anner Crete, MD;  Location: [facility];  Service: Urology;  Laterality: Left;  ureteral catheter placement    Family History:  Family History  Problem Relation Age of Onset  . Heart attack Father   . Cancer Mother   . HIV Brother     Social History:  reports that he has been smoking.  He has never used smokeless tobacco. He reports that  drinks alcohol. He reports that he does not use illicit drugs.  Allergies:  Allergies  Allergen Reactions  . Codeine Rash    Medications: Medications Prior to Admission  Medication Sig Dispense Refill  .  escitalopram (LEXAPRO) 10 MG tablet Take 10 mg by mouth daily.      [name] HYDROcodone-acetaminophen (NORCO/VICODIN) 5-325 MG per tablet Take 1 tablet by mouth every 6 (six) hours as needed for pain.      . Ipratropium-Albuterol (COMBIVENT RESPIMAT) 20-100 MCG/ACT AERS respimat Inhale 1 puff into the lungs every 6 (six) hours.      [name] lamoTRIgine (LAMICTAL) 25 MG tablet Take 25 mg by mouth 2 (two) times daily.       [name] lisinopril-hydrochlorothiazide (PRINZIDE,ZESTORETIC) 20-25 MG per tablet Take 1 tablet by mouth daily.      [name] LORazepam (ATIVAN) 0.5 MG tablet Take 0.5 mg by mouth 4 (four) times daily as needed for anxiety.      [name] losartan (COZAAR) 50 MG tablet Take 50 mg by mouth daily.      [name] tiotropium (SPIRIVA) 18 MCG inhalation capsule Place 18 mcg into inhaler and inhale daily.        Please HPI for pertinent positives, otherwise complete 10 system ROS negative.  Physical Exam: Blood pressure 123/67, pulse 67, temperature 98.6 F (37 C), temperature source Oral, resp. rate 18, height 5\' 7"  (1.702 m), weight 110 lb 14.3 oz (50.3 kg), SpO2 96.00%. Body mass index is 17.36 kg/(m^2).   General Appearance:  Alert, cooperative, no distress, appears stated age  Head:  Normocephalic, without obvious abnormality, atraumatic  ENT: Unremarkable  Neck: Supple, symmetrical, tracheostomy intact, site clean.  Lungs:   Clear to auscultation except few crackles at bases.  Heart:  Regular rate and rhythm, S1, S2 normal, no murmur, rub or gallop. Carotids 2+ without bruit.  Abdomen:   Soft, NT, well healed midline incision, (L)abd ostomy pink, viable.  Neurologic: Normal affect, no gross deficits.   Results for orders placed during the hospital encounter of 02/05/13 (from the past 48 hour(s))  GLUCOSE, CAPILLARY     Status: Abnormal   Collection Time    02/16/13 12:38 PM      Result Value Range   Glucose-Capillary 158 (*) 70 - 99 mg/dL   Comment 1 Notify RN    CULTURE, RESPIRATORY (NON-EXPECTORATED)      Status: None   Collection Time    02/16/13  1:59 PM      Result Value Range   Specimen Description TRACHEAL ASPIRATE     Special Requests NONE     Gram Stain       Value: MODERATE WBC PRESENT, PREDOMINANTLY PMN     MODERATE SQUAMOUS EPITHELIAL CELLS PRESENT     ABUNDANT GRAM POSITIVE COCCI     IN PAIRS FEW GRAM POSITIVE RODS     FEW GRAM NEGATIVE RODS   Culture Culture reincubated for better growth     Report Status PENDING    GLUCOSE, CAPILLARY     Status: Abnormal   Collection Time    02/16/13  4:55 PM      Result Value Range   Glucose-Capillary 102 (*) 70 - 99 mg/dL   Comment 1 Notify RN    GLUCOSE, CAPILLARY     Status: None   Collection Time    02/16/13  7:27 PM      Result Value Range   Glucose-Capillary 95  70 - 99 mg/dL  GLUCOSE, CAPILLARY     Status: None   Collection Time    02/17/13 12:52 AM      Result Value Range   Glucose-Capillary 74  70 - 99 mg/dL  GLUCOSE, CAPILLARY     Status: None   Collection Time    02/17/13  4:16 AM      Result Value Range   Glucose-Capillary 85  70 - 99 mg/dL  CBC     Status: Abnormal   Collection Time    02/17/13  6:45 AM      Result Value Range   WBC 14.8 (*) 4.0 - 10.5 K/uL   RBC 3.79 (*) 4.22 - 5.81 MIL/uL   Hemoglobin 11.6 (*) 13.0 - 17.0 g/dL   HCT 44.0 (*) 34.7 - 42.5 %   MCV 90.2  78.0 - 100.0 fL   MCH 30.6  26.0 - 34.0 pg   MCHC 33.9  30.0 - 36.0 g/dL   RDW 95.6  38.7 - 56.4 %   Platelets 399  150 - 400 K/uL  BASIC METABOLIC PANEL     Status: Abnormal   Collection Time    02/17/13  6:45 AM      Result Value Range   Sodium 147 (*) 135 - 145 mEq/L   Potassium 3.3 (*) 3.5 - 5.1 mEq/L   Chloride 108  96 - 112 mEq/L   CO2 26  19 - 32 mEq/L   Glucose, Bld 94  70 - 99 mg/dL   BUN 28 (*) 6 - 23 mg/dL   Creatinine, Ser 3.32  0.50 - 1.35 mg/dL   Calcium 9.6  8.4 - 95.1 mg/dL  GFR calc non Af Amer 81 (*) >90 mL/min   GFR calc Af Amer >90  >90 mL/min   Comment:            The eGFR has been calculated     using the  CKD EPI equation.     This calculation has not been     validated in all clinical     situations.     eGFR's persistently     <90 mL/min signify     possible Chronic Kidney Disease.  MAGNESIUM     Status: None   Collection Time    02/17/13  6:45 AM      Result Value Range   Magnesium 2.3  1.5 - 2.5 mg/dL  PHOSPHORUS     Status: None   Collection Time    02/17/13  6:45 AM      Result Value Range   Phosphorus 3.7  2.3 - 4.6 mg/dL  GLUCOSE, CAPILLARY     Status: None   Collection Time    02/17/13  7:23 AM      Result Value Range   Glucose-Capillary 83  70 - 99 mg/dL  GLUCOSE, CAPILLARY     Status: None   Collection Time    02/17/13 11:43 AM      Result Value Range   Glucose-Capillary 90  70 - 99 mg/dL   Comment 1 Documented in Chart     Comment 2 Notify RN    GLUCOSE, CAPILLARY     Status: None   Collection Time    02/17/13  3:57 PM      Result Value Range   Glucose-Capillary 87  70 - 99 mg/dL  GLUCOSE, CAPILLARY     Status: Abnormal   Collection Time    02/17/13  8:11 PM      Result Value Range   Glucose-Capillary 130 (*) 70 - 99 mg/dL   Comment 1 Notify RN    GLUCOSE, CAPILLARY     Status: Abnormal   Collection Time    02/18/13 12:17 AM      Result Value Range   Glucose-Capillary 115 (*) 70 - 99 mg/dL  GLUCOSE, CAPILLARY     Status: Abnormal   Collection Time    02/18/13  3:45 AM      Result Value Range   Glucose-Capillary 135 (*) 70 - 99 mg/dL  BASIC METABOLIC PANEL     Status: Abnormal   Collection Time    02/18/13  7:04 AM      Result Value Range   Sodium 146 (*) 135 - 145 mEq/L   Potassium 3.0 (*) 3.5 - 5.1 mEq/L   Chloride 108  96 - 112 mEq/L   CO2 33 (*) 19 - 32 mEq/L   Glucose, Bld 117 (*) 70 - 99 mg/dL   BUN 29 (*) 6 - 23 mg/dL   Creatinine, Ser 4.09  0.50 - 1.35 mg/dL   Calcium 9.0  8.4 - 81.1 mg/dL   GFR calc non Af Amer 77 (*) >90 mL/min   GFR calc Af Amer 89 (*) >90 mL/min   Comment:            The eGFR has been calculated     using the CKD  EPI equation.     This calculation has not been     validated in all clinical     situations.     eGFR's persistently     <90 mL/min signify  possible Chronic Kidney Disease.  CBC     Status: Abnormal   Collection Time    02/18/13  7:04 AM      Result Value Range   WBC 8.0  4.0 - 10.5 K/uL   RBC 3.27 (*) 4.22 - 5.81 MIL/uL   Hemoglobin 10.2 (*) 13.0 - 17.0 g/dL   HCT 16.1 (*) 09.6 - 04.5 %   MCV 88.7  78.0 - 100.0 fL   MCH 31.2  26.0 - 34.0 pg   MCHC 35.2  30.0 - 36.0 g/dL   RDW 40.9  81.1 - 91.4 %   Platelets 379  150 - 400 K/uL  MAGNESIUM     Status: None   Collection Time    02/18/13  7:04 AM      Result Value Range   Magnesium 2.3  1.5 - 2.5 mg/dL  PHOSPHORUS     Status: None   Collection Time    02/18/13  7:04 AM      Result Value Range   Phosphorus 3.7  2.3 - 4.6 mg/dL  GLUCOSE, CAPILLARY     Status: Abnormal   Collection Time    02/18/13  7:25 AM      Result Value Range   Glucose-Capillary 122 (*) 70 - 99 mg/dL   Dg Abd 1 View  7/82/9562  *RADIOLOGY REPORT*  Clinical Data: NG tube placement.  ABDOMEN - 1 VIEW  Comparison: 02/15/2013  Findings: An NG tube is identified with tip overlying the mid stomach.  IMPRESSION: NG tube tip overlying the mid stomach.   Original Report Authenticated By: Harmon Pier, M.D.    Dg Naso G Tube Plc W/fl-no Rad  02/17/2013  CLINICAL DATA: NPO, failed bedside swallow eval/ FEES   NASO G TUBE PLACEMENT WITH FLUORO  Fluoroscopy was utilized by the requesting physician.  No radiographic  interpretation.      Assessment/Plan Dysphagia, PCM, failed swallow study Discussed need for G-tube with dtr Kaycee. She is agreeable to procedure as long as there is possibility for his swallowing fxn to improve and feeding tube can eventually be removed. Reviewed imaging with IR MD, who feels we should be able to safely place G-tube given his prior hx of open G-tube placement. We can give barium via ostomy to define colon under Fluoro. Will plan for  procedure tomorrow. Stop TF at [place] Dtr will sign consent when she arrives.  Brayton El PA-C 02/18/2013, 11:52 AM

## 2013-02-18 NOTE — H&P (Signed)
Agree with PA note.    Signed,  Yuvaan Olander K. Alcus Bradly, MD Vascular & Interventional Radiologist [facility]  

## 2013-02-18 NOTE — Progress Notes (Signed)
PULMONARY  / CRITICAL CARE MEDICINE  Name: Zachary Walls MRN: [id number] DOB: 1956-09-22    ADMISSION DATE:  02/05/2013 CONSULTATION DATE:  14782  REFERRING MD :  Dr. Ignacia Palma PRIMARY SERVICE: PCCM  CHIEF COMPLAINT:  AMS  BRIEF PATIENT DESCRIPTION: Pt was last seen normal 4/16 at 4pm. Son found pt down in fetal position complaining of breathing. Pt had suspicious visitor that may have given pt drugs. 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 Pt continues to be altered and PCCM asked to manage.   SIGNIFICANT EVENTS / STUDIES:  4/17- AMS, admitted to ICU 4/18 - cardiac arrest with 7 minutes of CPR, emergent intubation, on pressor 4/18- eeg -slow, no focus 4/18 echo - 65%, WNL, euvolemic 4/19- clinical findings concerning seizures, keppra, more alert 4/22 MRI brain: Negative for acute infract. No acute intracranial abnormality 4/24 pt failed extubation unable to protect airway reintubated  LINES / TUBES: 4/18 ETT>>>4/24>>4/24 4/18 LIJ>>4/24 4/18 Foley>> (failed extraction 2/2 residual)  CULTURES: 4/17 BCx>>>neg  4/17 resp Cx>>neg 4/17 UCx>>neg 4/18 cdiff>>>neg 4/24 BCx>>> 4/24 UCx>>> 4/24 Resp Cx >>>  ANTIBIOTICS: 4/17 IV zosyn>>>4/20 4/18 unasyn>>>  4/23 4/17 Vanc >>>4/20  SUBJECTIVE: Continues to remove his NGT, were unable to replace yesterday.   VITAL SIGNS: Temp:  [97.6 F (36.4 C)-98.6 F (37 C)] 97.6 F (36.4 C) (04/30 0722) Pulse Rate:  [66-76] 67 (04/30 0722) Resp:  [14-21] 18 (04/30 0722) BP: (121-158)/(63-122) 123/63 mmHg (04/30 0722) SpO2:  [93 %-100 %] 95 % (04/30 0722) FiO2 (%):  [28 %-35 %] 35 % (04/30 0722) HEMODYNAMICS: CV stable   VENTILATOR SETTINGS: Vent Mode:  [-]  FiO2 (%):  [28 %-35 %] 35 % INTAKE / OUTPUT: Intake/Output     04/29 0701 - 04/30 0700 04/30 0701 - 05/01 0700   NG/GT 1250    IV Piggyback 250    Total Intake(mL/kg) 1500 (29.8)    Urine (mL/kg/hr) 1550 (1.3)    Stool 50 (0)    Total  Output 1600     Net -100          Urine Occurrence 1 x     PHYSICAL EXAMINATION: General: arousable male in nad, agitated. Neuro:  Not following commands, somewhat arousable HEENT:  Nose without purulence or d/c noted.  Trach in place Cardiovascular:  Mild tachy but regular Lungs:  BS distant, a few basilar crackles, no wheezing Abdomen:  Colostomy with brown stool drainage, no r/g LE without edema, no cyanosis  LABS:  Recent Labs Lab 02/12/13 0400 02/13/13 0035  02/14/13 1933 02/14/13 2045 02/14/13 2124 02/15/13 0143 02/15/13 0144 02/15/13 1013 02/16/13 0805 02/17/13 0645 02/18/13 0704  HGB 10.7*  --   --   --   --  10.0* 9.8*  --   --   --  11.6* 10.2*  WBC 10.0  --   --   --   --  16.7* 15.9*  --   --   --  14.8* 8.0  PLT 199  --   --   --   --  258 241  --   --   --  399 379  NA 151*  --   < >  --   --  153* 150*  --   --  147* 147* 146*  K 3.1*  --   < >  --   --  3.5 3.3*  --   --  3.2* 3.3* 3.0*  CL 112  --   < >  --   --  115* 111  --   --  110 108 108  CO2 32  --   < >  --   --  32 31  --   --  30 26 33*  GLUCOSE 102*  --   < >  --   --  106* 108*  --   --  133* 94 117*  BUN 29*  --   < >  --   --  27* 27*  --   --  28* 28* 29*  CREATININE 1.15  --   < >  --   --  1.24 1.21  --   --  1.07 1.01 1.06  CALCIUM 9.2  --   < >  --   --  9.2 9.2  --   --  9.2 9.6 9.0  MG  --   --   --   --   --  1.9  --   --   --   --  2.3 2.3  PHOS  --   --   --   --   --   --   --   --   --   --  3.7 3.7  AST  --   --   --   --   --  96*  --   --   --   --   --   --   ALT  --   --   --   --   --  78*  --   --   --   --   --   --   ALKPHOS  --   --   --   --   --  43  --   --   --   --   --   --   BILITOT  --   --   --   --   --  0.3  --   --   --   --   --   --   PROT  --   --   --   --   --  5.7*  --   --   --   --   --   --   ALBUMIN  --   --   --   --   --  2.3*  --   --   --   --   --   --   APTT 28  --   --   --   --   --   --   --   --   --   --   --   INR 1.05  --   --   --    --   --   --   --   --   --   --   --   TROPONINI  --   --   --   --   --  0.38*  --  0.41* <0.30  --   --   --   PHART  --  7.521*  --  7.477* 7.438  --   --   --   --   --   --   --   PCO2ART  --  33.4*  --  42.1 42.7  --   --   --   --   --   --   --   PO2ART  --  124.0*  --  143.0* 85.2  --   --   --   --   --   --   --   < > = values in this interval not displayed.  Recent Labs Lab 02/17/13 1557 02/17/13 2011 02/18/13 0017 02/18/13 0345 02/18/13 0725  GLUCAP 87 130* 115* 135* 122*   CXR: no film 4/26  ASSESSMENT / PLAN: Principal Problem:   Cardiac arrest Active Problems:   COPD (chronic obstructive pulmonary disease)   Severe protein-calorie malnutrition   Altered mental status   Anoxic encephalopathy   Respiratory failure, acute and chronic   Tracheostomy status  PULMONARY A: Severe emphysema with bullae; COPD      VDRF- emergent intubation following cardiac arrest R/o asp was treated 5 days of unasyn>> 4/24 S/p trach 4/24 He is tolerating TC well, will attempt to decrease to 28% FiO2 and keep off vent as tolerated specially that was able to tolerate TC overnight.  P:   - Duonebs  q6h. - On tapering prednisone, currently on 10 mg daily and maintain for 5 days and drop to 5 for 5 days on 5/3 then d/c on 5/8. - TC as tolerated, and FiO2 of 28%.  Ok to bring cuff down.  CARDIOVASCULAR A:  Hx of HTN  Cardiac arrest with 7 min of CPR on 4/18 Shock, post arrest -resolved Tachycardia and HTN  Likely 2/2 alcohol withdrawal?  P:  - Cont bisoprolol. - Added low dose norvasc with adequate BP control, will maintain current regiment.  GASTROINTESTINAL A:  emergent Hartmann's procedure 3/13 2/2 SBO with colostomy non-revised by surgery 2/2 poor nutrition and overall medical status - CT abdomen with no intraabdominal process   Elevated Transaminases -AST elevated likely 2/2 alcohol - improving -4/21 P:   - PPI. - Failed swallow evaluation, will ask IR to insert  G-tube after ok with family.  Renal:  A: Hypokalemia. P: Replace and recheck.  ENDOCRINE A:  No active issues P:   - SSI.  NEUROLOGIC A:  AMS( likely delirium) from EtOH withdrawal vs opidio w/d vs. possible anoxic brain injury ( s/p cardiac arrest) that can not be ruled out.  CT head was negative.  MRI ( 4/22)- Negative for acute infract  EEG- slowing but no focal disease, seizure seems less likely P:   - Cont-fentanyl 100 mcg patch q72h - Cont seroquel 50mg  AM and 100mg  QHS - Neuro following along, appreciate input. - Continue IV Thiamine and folic acid daily  Alyson Reedy, M.D. [facility] Pulmonary/Critical Care Medicine. Pager: [phone]. After hours pager: [phone].

## 2013-02-18 NOTE — Progress Notes (Signed)
Clinical Dietitian from [employer], Press photographer, stating she works until 2:30pm.  CSW attempted to phone pt's dtr and left voicemail message indicating pt's only bed offer at this time is [facility].  CSW to continue to follow and assist as needed.    Angelia Mould, MSW, [place] [phone]

## 2013-02-18 NOTE — Progress Notes (Signed)
Agitation   Restrains ordered 

## 2013-02-18 NOTE — Progress Notes (Signed)
Occupational Therapy Treatment Patient Details Name: Zachary Walls MRN: [id number] DOB: [birth year] Today's Date: 02/18/2013 Time: 4098-1191 OT Time Calculation (min): 20 min  OT Assessment / Plan / Recommendation Comments on Treatment Session  Pt improving with ability to participate in familiar activities such as BADLs; however, continues to be confused, disoriented, and impulsive with poor safety awareness.     Follow Up Recommendations  SNF;Supervision/Assistance - 24 hour    Barriers to Discharge       Equipment Recommendations  None recommended by OT    Recommendations for Other Services    Frequency Min 2X/week   Plan Discharge plan remains appropriate    Precautions / Restrictions Precautions Precautions: Fall Precaution Comments: trach collar, bilat mittens & soft restraints Restrictions Weight Bearing Restrictions: No   Pertinent Vitals/Pain     ADL  Eating/Feeding: NPO Grooming: Wash/dry face;Supervision/safety Where Assessed - Grooming: Unsupported sitting Lower Body Dressing: Supervision/safety (to don socks) Where Assessed - Lower Body Dressing: Unsupported sit to stand Transfers/Ambulation Related to ADLs: min guard - min A for sit to stand  and ambulation in room ADL Comments: Pt remains very confused.  Able to perform familiar ADL taks with supervision to min A.      OT Diagnosis:    OT Problem List:   OT Treatment Interventions:     OT Goals Acute Rehab OT Goals Time For Goal Achievement: 03/02/13 ADL Goals Pt Will Perform Grooming: with min assist;Standing at sink ADL Goal: Grooming - Progress: Progressing toward goals Pt Will Perform Upper Body Bathing: with min assist;Sitting, chair Pt Will Perform Lower Body Bathing: with min assist;Sit to stand from chair;Sit to stand from bed Pt Will Perform Lower Body Dressing: with min assist;Sit to stand from chair;Sit to stand from bed ADL Goal: Lower Body Dressing - Progress: Progressing toward  goals Additional ADL Goal #1: Pt will be oriented x 4 with min cues and use of external cues  Visit Information  Last OT Received On: 02/18/13 Assistance Needed: +2 (safety)    Subjective Data      Prior Functioning       Cognition  Cognition Arousal/Alertness: Awake/alert Behavior During Therapy: Restless Overall Cognitive Status: Impaired/Different from baseline Area of Impairment: Attention;Following commands;Safety/judgement;Awareness Orientation Level: Place;Time;Situation;Disoriented to Current Attention Level: Sustained Following Commands: Follows one step commands consistently Safety/Judgement: Decreased awareness of safety;Decreased awareness of deficits General Comments: Pt impulsive with no awareness of deficits  Difficult to assess due to: Tracheostomy    Mobility  Bed Mobility Bed Mobility: Supine to Sit Supine to Sit: 4: Min assist;HOB elevated Sitting - Scoot to Edge of Bed: 5: Supervision Sit to Supine: 5: Supervision Details for Bed Mobility Assistance: Pt requires min A to initiate movement to EOB Transfers Transfers: Sit to Stand;Stand to Sit Sit to Stand: 4: Min assist;With upper extremity assist;From bed Stand to Sit: 4: Min guard;With upper extremity assist;To bed Details for Transfer Assistance: max directional and verbal cues for safety and lines    Exercises      Balance Balance Balance Assessed: Yes Static Sitting Balance Static Sitting - Balance Support: No upper extremity supported Static Sitting - Level of Assistance: 5: Stand by assistance Static Standing Balance Static Standing - Balance Support: No upper extremity supported Static Standing - Level of Assistance: 4: Min assist   End of Session OT - End of Session Activity Tolerance: Patient tolerated treatment well Patient left: in bed;with call bell/phone within reach;with bed alarm set;with family/visitor present;with restraints reapplied Nurse Communication:  Mobility status   GO     Jeani Hawking M 02/18/2013, 2:49 PM

## 2013-02-19 ENCOUNTER — Inpatient Hospital Stay (HOSPITAL_COMMUNITY): Payer: Medicaid Other

## 2013-02-19 LAB — CBC
Hemoglobin: 11.2 g/dL — ABNORMAL LOW (ref 13.0–17.0)
MCHC: 34 g/dL (ref 30.0–36.0)
RBC: 3.67 MIL/uL — ABNORMAL LOW (ref 4.22–5.81)
WBC: 8.5 10*3/uL (ref 4.0–10.5)

## 2013-02-19 LAB — BASIC METABOLIC PANEL
BUN: 25 mg/dL — ABNORMAL HIGH (ref 6–23)
GFR calc Af Amer: >90 mL/min (ref >90)
GFR calc non Af Amer: 79 mL/min — ABNORMAL LOW (ref >90)
Potassium: 3.2 mEq/L — ABNORMAL LOW (ref 3.5–5.1)
Sodium: 148 mEq/L — ABNORMAL HIGH (ref 135–145)

## 2013-02-19 LAB — GLUCOSE, CAPILLARY: Glucose-Capillary: 93 mg/dL (ref 70–99)

## 2013-02-19 LAB — CULTURE, RESPIRATORY W GRAM STAIN

## 2013-02-19 LAB — CULTURE, BLOOD (ROUTINE X 2)
Culture: NO GROWTH
Culture: NO GROWTH

## 2013-02-19 LAB — MAGNESIUM: Magnesium: 2.2 mg/dL (ref 1.5–2.5)

## 2013-02-19 LAB — PHOSPHORUS: Phosphorus: 3.5 mg/dL (ref 2.3–4.6)

## 2013-02-19 MED ORDER — MIDAZOLAM HCL 2 MG/2ML IJ SOLN
INTRAMUSCULAR | Status: AC
  Filled 2013-02-19: qty 4

## 2013-02-19 MED ORDER — GLUCAGON HCL (RDNA) 1 MG IJ SOLR
INTRAMUSCULAR | Status: AC | PRN
  Administered 2013-02-19: 1 mg via INTRAVENOUS

## 2013-02-19 MED ORDER — MIDAZOLAM HCL 2 MG/2ML IJ SOLN
INTRAMUSCULAR | Status: AC | PRN
  Administered 2013-02-19 (×6): 1 mg via INTRAVENOUS

## 2013-02-19 MED ORDER — FENTANYL CITRATE 0.05 MG/ML IJ SOLN
INTRAMUSCULAR | Status: AC
  Filled 2013-02-19: qty 4

## 2013-02-19 MED ORDER — IOHEXOL 300 MG/ML  SOLN
50.0000 mL | Freq: Once | INTRAMUSCULAR | Status: AC | PRN
  Administered 2013-02-19: 50 mL via INTRAVENOUS

## 2013-02-19 MED ORDER — FENTANYL CITRATE 0.05 MG/ML IJ SOLN
INTRAMUSCULAR | Status: AC | PRN
  Administered 2013-02-19 (×6): 50 ug via INTRAVENOUS

## 2013-02-19 MED ORDER — POTASSIUM CHLORIDE 20 MEQ/15ML (10%) PO LIQD
40.0000 meq | Freq: Three times a day (TID) | ORAL | Status: AC
  Administered 2013-02-19: 40 meq
  Filled 2013-02-19: qty 30

## 2013-02-19 NOTE — Progress Notes (Signed)
PT Cancellation Note  Patient Details Name: Zachary Walls MRN: [id number] DOB: [birth year]   Cancelled Treatment:    Reason Eval/Treat Not Completed: Patient at procedure or test/unavailable.  Pt getting PEG place this AM.  Will attempt to see this PM if able. Thanks!!   Lotus Santillo 02/19/2013, 10:43 AM Jake Shark, PT DPT [phone]

## 2013-02-19 NOTE — Progress Notes (Signed)
Pt wrist restraints off at this time. Bilateral mittens on. Pt not trying to get out of bed or pulling at lines. Appears to be comfortable at this time. Family at bedside. Will call to continue restraints if needed. Will monitor.  M.Foster Simpson, RN

## 2013-02-19 NOTE — Progress Notes (Signed)
Travel setup for ATC prepared

## 2013-02-19 NOTE — Progress Notes (Signed)
ATC setup changed 

## 2013-02-19 NOTE — Progress Notes (Signed)
Pt keeps pulling at trach collar/attempting to get OOB. Call placed to MD. Bilateral wrist restraints renewed at this time. Will monitor.  M.Foster Simpson, RN

## 2013-02-19 NOTE — Procedures (Signed)
Successful fluoroscopic guided insertion of gastrostomy tube without immediate post procedural complicatoin.   The gastrostomy tube may be used immediately for medications.  Otherwise, place gastrostomy tube to low wall suction for 24 hrs.  Tube feeds may be initiated in 24 hours as per the primary team.   

## 2013-02-19 NOTE — Progress Notes (Signed)
Constantly pulling and picking at tubes. Bil restraints and mittens in place.  Med x 1 for restlesness

## 2013-02-19 NOTE — Progress Notes (Signed)
PULMONARY  / CRITICAL CARE MEDICINE  Name: Zachary Walls MRN: [id number] DOB: 1956-02-29    ADMISSION DATE:  02/05/2013 CONSULTATION DATE:  46962  REFERRING MD :  Dr. Ignacia Palma PRIMARY SERVICE: PCCM  CHIEF COMPLAINT:  AMS  BRIEF PATIENT DESCRIPTION: Pt was last seen normal 4/16 at 4pm. Son found pt down in fetal position complaining of breathing. Pt had suspicious visitor that may have given pt drugs. 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 Pt continues to be altered and PCCM asked to manage.   SIGNIFICANT EVENTS / STUDIES:  4/17- AMS, admitted to ICU 4/18 - cardiac arrest with 7 minutes of CPR, emergent intubation, on pressor 4/18- eeg -slow, no focus 4/18 echo - 65%, WNL, euvolemic 4/19- clinical findings concerning seizures, keppra, more alert 4/22 MRI brain: Negative for acute infract. No acute intracranial abnormality 4/24 pt failed extubation unable to protect airway reintubated  LINES / TUBES: 4/18 ETT>>>4/24>>4/24 4/18 LIJ>>4/24 4/18 Foley>> (failed extraction 2/2 residual)  CULTURES: 4/17 BCx>>>neg  4/17 resp Cx>>neg 4/17 UCx>>neg 4/18 cdiff>>>neg 4/24 BCx>>> 4/24 UCx>>> 4/24 Resp Cx >>>  ANTIBIOTICS: 4/17 IV zosyn>>>4/20 4/18 unasyn>>>  4/23 4/17 Vanc >>>4/20  SUBJECTIVE: Continues to remove his NGT, were unable to replace yesterday.   VITAL SIGNS: Temp:  [97.6 F (36.4 C)-98.6 F (37 C)] 97.8 F (36.6 C) (05/01 1059) Pulse Rate:  [62-82] 62 (05/01 1059) Resp:  [11-20] 14 (05/01 1059) BP: (113-152)/(57-87) 113/62 mmHg (05/01 1059) SpO2:  [93 %-100 %] 99 % (05/01 1059) FiO2 (%):  [28 %] 28 % (05/01 1059) Weight:  [50.2 kg (110 lb 10.7 oz)] 50.2 kg (110 lb 10.7 oz) (05/01 0356) HEMODYNAMICS: CV stable   VENTILATOR SETTINGS: Vent Mode:  [-]  FiO2 (%):  [28 %] 28 % INTAKE / OUTPUT: Intake/Output     04/30 0701 - 05/01 0700 05/01 0701 - 05/02 0700   NG/GT     IV Piggyback 50    Total Intake(mL/kg) 50 (1)    Urine  (mL/kg/hr) 2400 (2)    Stool     Total Output 2400     Net -2350           PHYSICAL EXAMINATION: General: arousable male in nad. Neuro:  Not following commands, somewhat arousable HEENT:  Nose without purulence or d/c noted.  Trach in place Cardiovascular:  Mild tachy but regular Lungs:  BS distant, a few basilar crackles, no wheezing Abdomen:  Colostomy with brown stool drainage, no r/g LE without edema, no cyanosis  LABS:  Recent Labs Lab 02/13/13 0035  02/14/13 1933 02/14/13 2045  02/14/13 2124  02/15/13 0144 02/15/13 1013  02/17/13 0645 02/18/13 0704 02/19/13 0455  HGB  --   --   --   --   --  10.0*  < >  --   --   --  11.6* 10.2* 11.2*  WBC  --   --   --   --   --  16.7*  < >  --   --   --  14.8* 8.0 8.5  PLT  --   --   --   --   --  258  < >  --   --   --  399 379 472*  NA  --   < >  --   --   --  153*  < >  --   --   < > 147* 146* 148*  K  --   < >  --   --   --  3.5  < >  --   --   < > 3.3* 3.0* 3.2*  CL  --   < >  --   --   --  115*  < >  --   --   < > 108 108 107  CO2  --   < >  --   --   --  32  < >  --   --   < > 26 33* 30  GLUCOSE  --   < >  --   --   --  106*  < >  --   --   < > 94 117* 98  BUN  --   < >  --   --   --  27*  < >  --   --   < > 28* 29* 25*  CREATININE  --   < >  --   --   --  1.24  < >  --   --   < > 1.01 1.06 1.03  CALCIUM  --   < >  --   --   --  9.2  < >  --   --   < > 9.6 9.0 9.6  MG  --   --   --   --   < > 1.9  --   --   --   --  2.3 2.3 2.2  PHOS  --   --   --   --   --   --   --   --   --   --  3.7 3.7 3.5  AST  --   --   --   --   --  96*  --   --   --   --   --   --   --   ALT  --   --   --   --   --  78*  --   --   --   --   --   --   --   ALKPHOS  --   --   --   --   --  43  --   --   --   --   --   --   --   BILITOT  --   --   --   --   --  0.3  --   --   --   --   --   --   --   PROT  --   --   --   --   --  5.7*  --   --   --   --   --   --   --   ALBUMIN  --   --   --   --   --  2.3*  --   --   --   --   --   --   --    TROPONINI  --   --   --   --   --  0.38*  --  0.41* <0.30  --   --   --   --   PHART 7.521*  --  7.477* 7.438  --   --   --   --   --   --   --   --   --   PCO2ART 33.4*  --  42.1 42.7  --   --   --   --   --   --   --   --   --  PO2ART 124.0*  --  143.0* 85.2  --   --   --   --   --   --   --   --   --   < > = values in this interval not displayed.  Recent Labs Lab 02/18/13 1541 02/18/13 1928 02/19/13 0042 02/19/13 0355 02/19/13 0727  GLUCAP 115* 92 86 83 91   CXR: no film 4/26  ASSESSMENT / PLAN: Principal Problem:   Cardiac arrest Active Problems:   COPD (chronic obstructive pulmonary disease)   Severe protein-calorie malnutrition   Altered mental status   Anoxic encephalopathy   Respiratory failure, acute and chronic   Tracheostomy status  PULMONARY A: Severe emphysema with bullae; COPD      VDRF- emergent intubation following cardiac arrest R/o asp was treated 5 days of unasyn>> 4/24 S/p trach 4/24 He is tolerating TC well, will attempt to decrease to 28% FiO2 and keep off vent as tolerated specially that was able to tolerate TC overnight.  P:   - Duonebs  q6h. - On tapering prednisone, currently on 10 mg daily and maintain for 5 days and drop to 5 for 5 days on 5/3 then d/c on 5/8. - TC as tolerated, and FiO2 of 28%.  Ok to bring cuff down.  CARDIOVASCULAR A:  Hx of HTN  Cardiac arrest with 7 min of CPR on 4/18 Shock, post arrest -resolved Tachycardia and HTN  Likely 2/2 alcohol withdrawal?  P:  - Cont bisoprolol. - Added low dose norvasc with adequate BP control, will maintain current regiment.  GASTROINTESTINAL A:  emergent Hartmann's procedure 3/13 2/2 SBO with colostomy non-revised by surgery 2/2 poor nutrition and overall medical status - CT abdomen with no intraabdominal process   Elevated Transaminases -AST elevated likely 2/2 alcohol - improving -4/21 P:   - PPI. - G-tube per IR today.  Renal:  A: Hypokalemia. P: Replace and  recheck.  ENDOCRINE A:  No active issues P:   - SSI.  NEUROLOGIC A:  AMS( likely delirium) from EtOH withdrawal vs opidio w/d vs. possible anoxic brain injury ( s/p cardiac arrest) that can not be ruled out.  CT head was negative.  MRI ( 4/22)- Negative for acute infract  EEG- slowing but no focal disease, seizure seems less likely P:   - Cont-fentanyl 100 mcg patch q72h - Cont seroquel 50mg  AM and 100mg  QHS - Neuro following along, appreciate input. - Continue IV Thiamine and folic acid daily  Alyson Reedy, M.D. [facility] Pulmonary/Critical Care Medicine. Pager: [phone]. After hours pager: [phone].

## 2013-02-20 LAB — MAGNESIUM: Magnesium: 2.1 mg/dL (ref 1.5–2.5)

## 2013-02-20 LAB — CBC
HCT: 32.6 % — ABNORMAL LOW (ref 39.0–52.0)
RDW: 14.8 % (ref 11.5–15.5)
WBC: 6.2 10*3/uL (ref 4.0–10.5)

## 2013-02-20 LAB — GLUCOSE, CAPILLARY
Glucose-Capillary: 72 mg/dL (ref 70–99)
Glucose-Capillary: 85 mg/dL (ref 70–99)
Glucose-Capillary: 110 mg/dL — ABNORMAL HIGH (ref 70–99)
Glucose-Capillary: 113 mg/dL — ABNORMAL HIGH (ref 70–99)
Glucose-Capillary: 103 mg/dL — ABNORMAL HIGH (ref 70–99)

## 2013-02-20 LAB — BASIC METABOLIC PANEL
BUN: 22 mg/dL (ref 6–23)
Chloride: 111 mEq/L (ref 96–112)
GFR calc Af Amer: 89 mL/min — ABNORMAL LOW (ref >90)
Potassium: 3.2 mEq/L — ABNORMAL LOW (ref 3.5–5.1)

## 2013-02-20 MED ORDER — LEVOFLOXACIN 25 MG/ML PO SOLN
500.0000 mg | Freq: Every day | ORAL | Status: DC
  Filled 2013-02-20: qty 20

## 2013-02-20 MED ORDER — POTASSIUM CHLORIDE 20 MEQ/15ML (10%) PO LIQD
40.0000 meq | Freq: Three times a day (TID) | ORAL | Status: AC
  Administered 2013-02-20 (×2): 40 meq
  Filled 2013-02-20: qty 30

## 2013-02-20 MED ORDER — PREDNISONE 5 MG/5ML PO SOLN
10.0000 mg | Freq: Every day | ORAL | Status: DC
  Administered 2013-02-21 – 2013-02-23 (×3): 10 mg
  Filled 2013-02-20 (×4): qty 10

## 2013-02-20 MED ORDER — LEVOFLOXACIN IN D5W 500 MG/100ML IV SOLN
500.0000 mg | INTRAVENOUS | Status: DC
  Administered 2013-02-20 – 2013-02-22 (×3): 500 mg via INTRAVENOUS
  Filled 2013-02-20 (×4): qty 100

## 2013-02-20 MED ORDER — LEVOFLOXACIN 25 MG/ML PO SOLN
500.0000 mg | Freq: Every day | ORAL | Status: DC
  Administered 2013-02-23: 500 mg via ORAL
  Filled 2013-02-20: qty 20

## 2013-02-20 NOTE — Progress Notes (Signed)
NUTRITION FOLLOW UP  Intervention:    Continue Osmolite 1.2 rate to 60 ml/h with Prostat 30 ml once daily to increase intake to 1828 kcals, 95 gm protein, 1181 ml free water daily.  Minimize time away from pump.    RD to follow wt trend for estimated needs.  Nutrition Dx:   Inadequate oral intake related to inability to eat as evidenced by NPO status. Ongoing.  Goal:   Intake to meet >90% of estimated nutrition needs, met.  Monitor:   TF tolerance/adequacy, weight trend, labs, vent status.  Assessment:   Patient failed extubation and required a trach on 4/24.  Patient remains on ventilator support.  MV: 9.6 Temp:Temp (24hrs), Avg:97.9 F (36.6 C), Min:97.5 F (36.4 C), Max:98 F (36.7 C)  Pt with new wt of 104 lbs.  Pt with several pulled NGTs and hours away from continuous feeds r/t decreased access.  Pt now s/p PEG which is ready for use per IR.  RN has resumed Osmolite 1.2 @ 60 mL/hr.  Pt tolerating.   Height: Ht Readings from Last 1 Encounters:  02/14/13 5\' 7"  (1.702 m)    Weight Status:  stable Wt Readings from Last 1 Encounters:  02/20/13 104 lb 11.5 oz (47.5 kg)  02/09/13  118 lb 13.3 oz (53.9 kg) 02/06/13  117 lb 1 oz (53.1 kg)   Body mass index is 16.4 kg/(m^2). Underweight   Re-estimated needs:  Kcal: 1610-9604 Protein: 80-100 gm Fluid: 1.8-1.9 L  Skin: intact  Diet Order: NPO  TF Order: Osmolite 1.2 at 60 ml/h with Prostat 30 ml daily for a total intake of 1828 kcals, 95 gm protein, 1180 ml free water daily. Free water flushes: 250 ml every 8 hours MVI via tube daily.    Intake/Output Summary (Last 24 hours) at 02/20/13 1145 Last data filed at 02/20/13 0754  Gross per 24 hour  Intake      0 ml  Output   1400 ml  Net  -1400 ml    Last BM: 5/2 (colostomy)   Labs:   Recent Labs Lab 02/18/13 0704 02/19/13 0455 02/20/13 0445  NA 146* 148* 150*  K 3.0* 3.2* 3.2*  CL 108 107 111  CO2 33* 30 31  BUN 29* 25* 22  CREATININE 1.06 1.03  1.06  CALCIUM 9.0 9.6 9.0  MG 2.3 2.2 2.1  PHOS 3.7 3.5 4.0  GLUCOSE 117* 98 93    CBG (last 3)   Recent Labs  02/20/13 0028 02/20/13 0418 02/20/13 0755  GLUCAP 76 85 68*    Scheduled Meds: . albuterol  2.5 mg Nebulization Q6H  . antiseptic oral rinse  15 mL Mouth Rinse QID  . bisoprolol  20 mg Oral Daily  . chlorhexidine  15 mL Mouth Rinse BID  . feeding supplement  30 mL Per Tube Q1200  . fentaNYL  100 mcg Transdermal Q72H  . folic acid  1 mg Per Tube Daily  . free water  250 mL Per Tube Q6H  . heparin  5,000 Units Subcutaneous Q8H  . insulin aspart  0-9 Units Subcutaneous Q4H  . ipratropium  0.5 mg Nebulization Q6H  . levofloxacin (LEVAQUIN) IV  750 mg Intravenous Q24H  . multivitamin  5 mL Per Tube Daily  . pantoprazole sodium  40 mg Per Tube Q1200  . predniSONE  20 mg Per Tube Q breakfast  . QUEtiapine  100 mg Oral QHS  . QUEtiapine  50 mg Oral q morning - 10a  . thiamine  100 mg Oral Daily    Continuous Infusions: . dextrose 50 mL (02/13/13 1252)  . feeding supplement (OSMOLITE 1.2 CAL) 1,000 mL (02/20/13 0815)    Loyce Dys, MS RD LDN Clinical Inpatient Dietitian Pager: [phone] Weekend/After hours pager: [phone]

## 2013-02-20 NOTE — Progress Notes (Signed)
Clinical Social Worker received notification from MD and [facility] that pt is ready for dc today.  CSW spoke with Zachary Walls at [facility].  CSW staffed case with Zachary Walls; pt will go with LOG for Room and Board.  Just to review with Zachary Walls and phone this CSW back if they have a bed available.  CSW spoke with pt's dtr in law, Zachary Walls.  CSW reviewed that pt is ready for dc and this CSW has attempted to get in touch with pt's son and dtr unsuccessfully.  CSW requested that Zachary Walls have pt's son or dtr phone this CSW back.  CSW stressed the importance of assisting with dc planning needs as pt is not fully oriented at this time, Zachary Walls stated understanding.  Zachary Walls stated, "I'll call my husband and have him give you a call".  CSW provided location and contact information for [facility] to [place].  CSW to continue to follow and assist as needed.   Angelia Mould, MSW, [place] [phone]

## 2013-02-20 NOTE — Progress Notes (Signed)
Clinical Social Worker received phone call from pt's son, TJ [phone]).  TJ stated that "No one has told me nothing about my dad going to a facility or being ready to leave the hospital".  CSW provided support to son.  CSW reviewed that this CSW spoke in person with TJ's wife, Marcelino Duster, and has left several voicemail messages with son and dtr and have not received return call until today. Son stated, "Elvera Bicker been trying to reach me on my home number, I don't get home until 9pm".  CSW inquired if son had received voicemail messages left on son's machine; son did not reply.  CSW reviewed SNF information with son and addressed questions/concerns.  Son stated, "Me and my sister are leaving to go out of town today at 2 and we are now rushing to try and get this done".  CSW validated son's feelings.  Son had several questions related to pt's medical care.  CSW offered to have MD phone pt's son at number most convivent to son; son agreeable.  CSW provided contact information to MD.     Angelia Mould, MSW, [place] [phone]

## 2013-02-20 NOTE — Progress Notes (Signed)
Clinical Social Worker spoke with pt's son in pt's room.  Son reports he spoke with MD and dc will be on Monday.  CSW stressed that on Monday he or his sister will need to be available to sign paperwork at [facility]; son stated agreement.  CSW provided opportunity for son to address questions/concerns.  CSW updated SNF.  CSW to continue to follow and assist as needed.    Angelia Mould, MSW, [place] [phone]

## 2013-02-20 NOTE — Progress Notes (Signed)
Clinical Child psychotherapist received phone call from pt's dtr, Yvonne Kendall.  Yvonne Kendall reports that she will phone this CSW on Monday to continue assistance with dc to [facility] on Monday.  CSW to continue to follow and assist as needed.    Angelia Mould, MSW, [place] [phone]

## 2013-02-20 NOTE — Progress Notes (Signed)
Bilateral wrist restraints removed per family request who will be at bedside and monitor pt. Requested that family tell nurse before they leave.   2330-Family leaving. Pt comfortable and sleeping in bed. Bilateral mitts on, side rails up x4. Will leave wrist restraints off at this time. Bed alarm on. Will continue to monitor pt and re-assess need for restraints.  M.Foster Simpson, RN

## 2013-02-20 NOTE — Progress Notes (Signed)
Physical Therapy Treatment Patient Details Name: Zachary Walls MRN: [id number] DOB: 1955-12-07 Today's Date: 02/20/2013 Time: 4098-1191 PT Time Calculation (min): 26 min  PT Assessment / Plan / Recommendation Comments on Treatment Session  Pt able to increase ambulation distance however continues to be unsafe and needs constant cues for redirection to task.  Pt will continue to benefit from SNF placement upon d/c.    Follow Up Recommendations  SNF     Does the patient have the potential to tolerate intense rehabilitation     Barriers to Discharge        Equipment Recommendations       Recommendations for Other Services    Frequency Min 3X/week   Plan Discharge plan remains appropriate;Frequency needs to be updated    Precautions / Restrictions Precautions Precautions: Fall Precaution Comments: trach colloar   Pertinent Vitals/Pain No c/o pain    Mobility  Bed Mobility Bed Mobility: Supine to Sit Supine to Sit: 5: Supervision Sitting - Scoot to Edge of Bed: 5: Supervision Details for Bed Mobility Assistance: supervision for safety Transfers Transfers: Sit to Stand;Stand to Sit Sit to Stand: 4: Min assist;With upper extremity assist;From bed Stand to Sit: 4: Min guard;With upper extremity assist;To bed Details for Transfer Assistance: (A) to initiate transfer and max cues for hand placement and safety. Ambulation/Gait Ambulation/Gait Assistance: 4: Min assist (2nd person for lines/chair) Ambulation Distance (Feet): 200 Feet Assistive device: Rolling walker Ambulation/Gait Assistance Details: Pt impulsive and needs max cues to stay on task and manage on RW.  Pt with mild ataxic gait with cues for heel to toe. Gait Pattern: Step-through pattern;Scissoring;Ataxic;Narrow base of support Gait velocity: wfl    Exercises     PT Diagnosis:    PT Problem List:   PT Treatment Interventions:     PT Goals Acute Rehab PT Goals PT Goal Formulation: Patient unable to  participate in goal setting Time For Goal Achievement: 02/28/13 Potential to Achieve Goals: Fair Pt will go Supine/Side to Sit: with min assist PT Goal: Supine/Side to Sit - Progress: Met Pt will go Sit to Stand: with min assist PT Goal: Sit to Stand - Progress: Met Pt will go Stand to Sit: with min assist PT Goal: Stand to Sit - Progress: Met Pt will Ambulate: 16 - 50 feet;with +2 total assist PT Goal: Ambulate - Progress: Progressing toward goal  Visit Information  Last PT Received On: 02/20/13 Assistance Needed: +2 (safety and lines)    Subjective Data  Subjective: Pt on trach collar but agreeable to get OOB. Patient Stated Goal: Unable to state.   Cognition  Cognition Arousal/Alertness: Awake/alert Behavior During Therapy: Restless Overall Cognitive Status: Impaired/Different from baseline Area of Impairment: Attention;Following commands;Safety/judgement;Awareness Current Attention Level: Sustained Following Commands: Follows one step commands consistently Safety/Judgement: Decreased awareness of safety;Decreased awareness of deficits General Comments: Pt impulsive with no awareness of deficits  Difficult to assess due to: Tracheostomy    Balance  Balance Balance Assessed: Yes Static Sitting Balance Static Sitting - Balance Support: No upper extremity supported Static Sitting - Level of Assistance: 5: Stand by assistance  End of Session PT - End of Session Equipment Utilized During Treatment: Gait belt;Oxygen (28%, 6L) Activity Tolerance: Patient tolerated treatment well Patient left: in chair;with call bell/phone within reach;with nursing in room Nurse Communication: Mobility status   GP     Zaheer Wageman 02/20/2013, 3:45 PM Jake Shark, PT DPT [phone]

## 2013-02-20 NOTE — Progress Notes (Signed)
Wrist restraints have been off since 2230. Pt was comfortable and slept through the night. Pt resting at this time. Bilateral mitts on and side rails up x4. Will continue to leave wrist restraints off. Will report to AM nurse and continue restraints if needed.   M.Foster Simpson, RN

## 2013-02-20 NOTE — Progress Notes (Addendum)
PULMONARY  / CRITICAL CARE MEDICINE  Name: Zachary Walls MRN: [id number] DOB: May 29, 1956    ADMISSION DATE:  02/05/2013 CONSULTATION DATE:  40981  REFERRING MD :  Dr. Ignacia Palma PRIMARY SERVICE: PCCM  CHIEF COMPLAINT:  AMS  BRIEF PATIENT DESCRIPTION: Pt was last seen normal 4/16 at 4pm. Son found pt down in fetal position complaining of breathing. Pt had suspicious visitor that may have given pt drugs. 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 Pt continues to be altered and PCCM asked to manage.   SIGNIFICANT EVENTS / STUDIES:  4/17- AMS, admitted to ICU 4/18 - cardiac arrest with 7 minutes of CPR, emergent intubation, on pressor 4/18- eeg -slow, no focus 4/18 echo - 65%, WNL, euvolemic 4/19- clinical findings concerning seizures, keppra, more alert 4/22 MRI brain: Negative for acute infract. No acute intracranial abnormality 4/24 pt failed extubation unable to protect airway reintubated  LINES / TUBES: 4/18 ETT>>>4/24>>4/24 4/18 LIJ>>4/24 4/18 Foley>> (failed extraction 2/2 residual)  CULTURES: 4/17 BCx>>>neg  4/17 resp Cx>>neg 4/17 UCx>>neg 4/18 cdiff>>>neg 4/24 BCx>>>NTD 4/24 UCx>>>NTD 4/24 Resp Cx >>>NTD  ANTIBIOTICS: 4/17 IV zosyn>>>4/20 4/18 unasyn>>>  4/23 4/17 Vanc >>>4/20 Levaquin 4/28>>> changed to PO on 5/2, will continue an 8 day course.  SUBJECTIVE: Continues to remove his NGT, were unable to replace yesterday.   VITAL SIGNS: Temp:  [97.5 F (36.4 C)-98.3 F (36.8 C)] 98.3 F (36.8 C) (05/02 1122) Pulse Rate:  [56-86] 75 (05/02 1122) Resp:  [12-21] 14 (05/02 1122) BP: (108-155)/(59-80) 146/80 mmHg (05/02 1122) SpO2:  [97 %-100 %] 100 % (05/02 1122) FiO2 (%):  [28 %] 28 % (05/02 1122) Weight:  [47.5 kg (104 lb 11.5 oz)] 47.5 kg (104 lb 11.5 oz) (05/02 0419) HEMODYNAMICS: CV stable   VENTILATOR SETTINGS: Vent Mode:  [-]  FiO2 (%):  [28 %] 28 % INTAKE / OUTPUT: Intake/Output     05/01 0701 - 05/02 0700 05/02 0701  - 05/03 0700   IV Piggyback     Total Intake(mL/kg)     Urine (mL/kg/hr) 1050 (0.9) 700 (2.8)   Stool  50 (0.2)   Total Output 1050 750   Net -1050 -750         PHYSICAL EXAMINATION: General: arousable male in nad. Neuro:  Not following commands, somewhat arousable HEENT:  Nose without purulence or d/c noted.  Trach in place Cardiovascular:  Mild tachy but regular Lungs:  BS distant, a few basilar crackles, no wheezing Abdomen:  Colostomy with brown stool drainage, no r/g LE without edema, no cyanosis  LABS:  Recent Labs Lab 02/14/13 1933 02/14/13 2045 02/14/13 2124  02/15/13 0144 02/15/13 1013  02/18/13 0704 02/19/13 0455 02/20/13 0445  HGB  --   --  10.0*  < >  --   --   < > 10.2* 11.2* 10.9*  WBC  --   --  16.7*  < >  --   --   < > 8.0 8.5 6.2  PLT  --   --  258  < >  --   --   < > 379 472* 408*  NA  --   --  153*  < >  --   --   < > 146* 148* 150*  K  --   --  3.5  < >  --   --   < > 3.0* 3.2* 3.2*  CL  --   --  115*  < >  --   --   < >  108 107 111  CO2  --   --  32  < >  --   --   < > 33* 30 31  GLUCOSE  --   --  106*  < >  --   --   < > 117* 98 93  BUN  --   --  27*  < >  --   --   < > 29* 25* 22  CREATININE  --   --  1.24  < >  --   --   < > 1.06 1.03 1.06  CALCIUM  --   --  9.2  < >  --   --   < > 9.0 9.6 9.0  MG  --   --  1.9  --   --   --   < > 2.3 2.2 2.1  PHOS  --   --   --   --   --   --   < > 3.7 3.5 4.0  AST  --   --  96*  --   --   --   --   --   --   --   ALT  --   --  78*  --   --   --   --   --   --   --   ALKPHOS  --   --  43  --   --   --   --   --   --   --   BILITOT  --   --  0.3  --   --   --   --   --   --   --   PROT  --   --  5.7*  --   --   --   --   --   --   --   ALBUMIN  --   --  2.3*  --   --   --   --   --   --   --   TROPONINI  --   --  0.38*  --  0.41* <0.30  --   --   --   --   PHART 7.477* 7.438  --   --   --   --   --   --   --   --   PCO2ART 42.1 42.7  --   --   --   --   --   --   --   --   PO2ART 143.0* 85.2  --   --   --    --   --   --   --   --   < > = values in this interval not displayed.  Recent Labs Lab 02/19/13 1543 02/19/13 1956 02/20/13 0028 02/20/13 0418 02/20/13 0755  GLUCAP 100* 72 76 85 68*   CXR: no film 4/26  ASSESSMENT / PLAN: Principal Problem:   Cardiac arrest Active Problems:   COPD (chronic obstructive pulmonary disease)   Severe protein-calorie malnutrition   Altered mental status   Anoxic encephalopathy   Respiratory failure, acute and chronic   Tracheostomy status  PULMONARY A: Severe emphysema with bullae; COPD      VDRF- emergent intubation following cardiac arrest R/o asp was treated 5 days of unasyn>> 4/24 S/p trach 4/24 - Tolerating TC well and down to 28%.  PMV occasionally.  P:   - Duonebs  q6h. - On tapering prednisone, currently  on 10 mg daily and maintain for 5 days and drop to 5 for 5 days on 5/3 then d/c on 5/8. - TC as tolerated, and FiO2 of 28%.  Ok to bring cuff down and PMV. - Change IV to PO levaquin for tracheobronchitis.  CARDIOVASCULAR A:  Hx of HTN  Cardiac arrest with 7 min of CPR on 4/18 Shock, post arrest -resolved Tachycardia and HTN  Likely 2/2 alcohol withdrawal?  P:  - Cont bisoprolol. - Added low dose norvasc with adequate BP control, will maintain current regiment.  GASTROINTESTINAL A:  emergent Hartmann's procedure 3/13 2/2 SBO with colostomy non-revised by surgery 2/2 poor nutrition and overall medical status - CT abdomen with no intraabdominal process   Elevated Transaminases -AST elevated likely 2/2 alcohol - improving -4/21 P:   - PPI. - G-tube per IR, ok to restart TF.  Renal:  A: Hypokalemia. P: Replace and recheck.  Hold further lasix.  ENDOCRINE A:  No active issues P:   - SSI.  NEUROLOGIC A:  AMS( likely delirium) from EtOH withdrawal vs opidio w/d vs. possible anoxic brain injury ( s/p cardiac arrest) that can not be ruled out.  CT head was negative.  MRI ( 4/22)- Negative for acute infract  EEG- slowing  but no focal disease, seizure seems less likely P:   - Cont-fentanyl 100 mcg patch q72h - Cont seroquel 50mg  AM and 100mg  QHS - Neuro following along, appreciate input. - Continue IV Thiamine and folic acid daily  Patient ready for discharge from a medical standpoint, remains delirious at times but I suspect leaving the hospital to more of a rehab situation would be beneficial for him.  Spoke to son TJ over the phone and is very concerned about patient going to SNF since his grandmother was evidently "ignored" there and died.  I advised him to speak with CSW and to inspect SNFs...etc.  They are to contact me with final decision.    Alyson Reedy, M.D. [facility] Pulmonary/Critical Care Medicine. Pager: [phone]. After hours pager: [phone].

## 2013-02-20 NOTE — Progress Notes (Signed)
Patient ID: Zachary Walls, male   DOB: [birth year], 57 y.o.   MRN: [id number]   G tube placed 5/1 afeb +BS Site clean and dry No bleeding  May use now

## 2013-02-21 DIAGNOSIS — I10 Essential (primary) hypertension: Secondary | ICD-10-CM

## 2013-02-21 LAB — BASIC METABOLIC PANEL
CO2: 30 mEq/L (ref 19–32)
Calcium: 8.8 mg/dL (ref 8.4–10.5)
Chloride: 109 mEq/L (ref 96–112)
Glucose, Bld: 159 mg/dL — ABNORMAL HIGH (ref 70–99)
Sodium: 146 mEq/L — ABNORMAL HIGH (ref 135–145)

## 2013-02-21 LAB — PHOSPHORUS: Phosphorus: 3.4 mg/dL (ref 2.3–4.6)

## 2013-02-21 LAB — MAGNESIUM: Magnesium: 2.3 mg/dL (ref 1.5–2.5)

## 2013-02-21 LAB — GLUCOSE, CAPILLARY: Glucose-Capillary: 134 mg/dL — ABNORMAL HIGH (ref 70–99)

## 2013-02-21 LAB — CBC
Hemoglobin: 10.7 g/dL — ABNORMAL LOW (ref 13.0–17.0)
MCH: 30.7 pg (ref 26.0–34.0)
Platelets: 375 10*3/uL (ref 150–400)
RBC: 3.48 MIL/uL — ABNORMAL LOW (ref 4.22–5.81)
WBC: 6.9 10*3/uL (ref 4.0–10.5)

## 2013-02-21 MED ORDER — BISOPROLOL FUMARATE 5 MG PO TABS
5.0000 mg | ORAL_TABLET | Freq: Every day | ORAL | Status: DC
  Administered 2013-02-22 – 2013-02-23 (×2): 5 mg via ORAL
  Filled 2013-02-21 (×2): qty 1

## 2013-02-21 NOTE — Progress Notes (Signed)
PULMONARY  / CRITICAL CARE MEDICINE  Name: Zachary Walls MRN: [id number] DOB: 1956/04/02    ADMISSION DATE:  02/05/2013 CONSULTATION DATE:  02/05/13  REFERRING MD :  Dr. Ignacia Palma PRIMARY SERVICE: PCCM  CHIEF COMPLAINT:  AMS  BRIEF PATIENT DESCRIPTION: Pt was last seen normal 4/16 at 4pm. Son found pt down in fetal position complaining of breathing difficulty. Pt had suspicious visitor that may have given pt drugs. 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 Pt continued to be altered in ER and PCCM asked to manage.   SIGNIFICANT EVENTS / STUDIES:  4/17- AMS, admitted to ICU 4/18 - cardiac arrest with 7 minutes of CPR, emergent intubation, on pressor 4/18- eeg -slow, no focus 4/18 echo - 65%, WNL, euvolemic 4/19- clinical findings concerning seizures, keppra, more alert 4/22 MRI brain: Negative for acute infract. No acute intracranial abnormality 4/24 pt failed extubation unable to protect airway reintubated > tached 5/1 G tube    LINES / TUBES: 4/18 ETT>>>4/24>>4/24 4/18 LIJ>>4/24 4/18 Foley>> (failed extraction 2/2 residual) 4/24/ Trach >>> 5/1 G Tube >>>  CULTURES: 4/17 BCx>>>neg  4/17 resp Cx>>neg 4/17 UCx>>neg 4/18 cdiff>>>neg 4/24 BCx>>>NTD 4/24 UCx>>>NTD 4/24 Resp Cx >>>NTD  ANTIBIOTICS: 4/17 IV zosyn>>>4/20 4/18 unasyn>>>  4/23 4/17 Vanc >>>4/20 Levaquin 4/28>>> changed to PO on 5/2, plan stop 5/6 (8 days total)  SUBJECTIVE: Alert, mouths no complaints   VITAL SIGNS: Temp:  [97.5 F (36.4 C)-98.2 F (36.8 C)] 98 F (36.7 C) (05/03 1200) Pulse Rate:  [64-90] 64 (05/03 1200) Resp:  [9-23] 14 (05/03 1200) BP: (94-125)/(51-72) 94/51 mmHg (05/03 1200) SpO2:  [95 %-100 %] 100 % (05/03 1350) FiO2 (%):  [28 %] 28 % (05/03 1350) Weight:  [104 lb 0.9 oz (47.2 kg)] 104 lb 0.9 oz (47.2 kg) (05/03 0444)      VENTILATOR SETTINGS: Vent Mode:  [-]  FiO2 (%):  [28 %] 28 % INTAKE / OUTPUT: Intake/Output     05/02 0701 - 05/03 0700  05/03 0701 - 05/04 0700   Other 720 480   NG/GT 500 250   IV Piggyback  100   Total Intake(mL/kg) 1220 (25.8) 830 (17.6)   Urine (mL/kg/hr) 1250 (1.1) 200 (0.5)   Stool 100 (0.1) 25 (0.1)   Total Output 1350 225   Net -130 +605         PHYSICAL EXAMINATION: General: alert male in nad. Neuro:  Mouths words, adjusts TV but hard to follow HEENT:  Nose without purulence or d/c noted.  Trach in place Cardiovascular:  Mild tachy but regular Lungs:  BS distant, a few basilar crackles, no wheezing Abdomen:  Colostomy with brown stool drainage, no r/g LE without edema, no cyanosis  LABS:  Recent Labs Lab 02/14/13 1933 02/14/13 2045 02/14/13 2124  02/15/13 0144 02/15/13 1013  02/19/13 0455 02/20/13 0445 02/21/13 0625  HGB  --   --  10.0*  < >  --   --   < > 11.2* 10.9* 10.7*  WBC  --   --  16.7*  < >  --   --   < > 8.5 6.2 6.9  PLT  --   --  258  < >  --   --   < > 472* 408* 375  NA  --   --  153*  < >  --   --   < > 148* 150* 146*  K  --   --  3.5  < >  --   --   < >  3.2* 3.2* 3.3*  CL  --   --  115*  < >  --   --   < > 107 111 109  CO2  --   --  32  < >  --   --   < > 30 31 30   GLUCOSE  --   --  106*  < >  --   --   < > 98 93 159*  BUN  --   --  27*  < >  --   --   < > 25* 22 21  CREATININE  --   --  1.24  < >  --   --   < > 1.03 1.06 1.07  CALCIUM  --   --  9.2  < >  --   --   < > 9.6 9.0 8.8  MG  --   --  1.9  --   --   --   < > 2.2 2.1 2.3  PHOS  --   --   --   --   --   --   < > 3.5 4.0 3.4  AST  --   --  96*  --   --   --   --   --   --   --   ALT  --   --  78*  --   --   --   --   --   --   --   ALKPHOS  --   --  43  --   --   --   --   --   --   --   BILITOT  --   --  0.3  --   --   --   --   --   --   --   PROT  --   --  5.7*  --   --   --   --   --   --   --   ALBUMIN  --   --  2.3*  --   --   --   --   --   --   --   TROPONINI  --   --  0.38*  --  0.41* <0.30  --   --   --   --   PHART 7.477* 7.438  --   --   --   --   --   --   --   --   PCO2ART 42.1 42.7  --    --   --   --   --   --   --   --   PO2ART 143.0* 85.2  --   --   --   --   --   --   --   --   < > = values in this interval not displayed.  Recent Labs Lab 02/20/13 1204 02/20/13 1602 02/20/13 1935 02/20/13 2352 02/21/13 1239  GLUCAP 110* 113* 103* 134* 137*   pcxr  4/26 > Slight increase in course and lung markings over the bilateral  upper lobes. Differential considerations include developing  pneumonia, aspiration, or other alveolar filling processes such as  edema or hemorrhage.   ASSESSMENT / PLAN: Principal Problem:   Cardiac arrest Active Problems:   COPD (chronic obstructive pulmonary disease)   Severe protein-calorie malnutrition   Altered mental status   Anoxic encephalopathy   Respiratory failure, acute and chronic   Tracheostomy  status  PULMONARY A: Severe emphysema with bullae; COPD      VDRF- emergent intubation following cardiac arrest  S/p trach 4/24 - Tolerating TC well and down to 28%.  PMV occasionally.  P:   - Duonebs  q6h. - On tapering prednisone, currently on 10 mg daily and maintain for 5 days and drop to 5 for 5 days on 5/3 then d/c on 5/8. - TC 24/7 and FiO2 of 28%.  Ok to bring cuff down and PMV. - Change IV to PO levaquin for tracheobronchitis.  CARDIOVASCULAR A:  Hx of HTN  Cardiac arrest with 7 min of CPR on 4/18 Shock, post arrest -resolved Tachycardia and HTN  Likely 2/2 alcohol withdrawal?  P:  - reduced bioprolol 5/3 - also on llow dose norvasc with adequate BP control, will maintain current regimen   GASTROINTESTINAL A:  emergent Hartmann's procedure 3/13 2/2 SBO with colostomy non-revised by surgery 2/2 poor nutrition and overall medical status - CT abdomen with no intraabdominal process   Elevated Transaminases -AST elevated likely 2/2 alcohol - improving -4/21 P:   - PPI. - G-tube done per IR 5/1,  tol TF.  Renal:  A: Hypokalemia. P: Replace and recheck.  Hold further lasix.  ENDOCRINE A:  No active issues P:    - SSI.  NEUROLOGIC A:  AMS( likely delirium) from EtOH withdrawal vs opioid w/d vs. possible anoxic brain injury ( s/p cardiac arrest) that can not be ruled out.  CT head was negative.  MRI ( 4/22)- Negative for acute infract  EEG- slowing but no focal disease, seizure seems less likely P:   - Cont-fentanyl 100 mcg patch q72h - Cont seroquel 50mg  AM and 100mg  QHS - Neuro following along, appreciate input. - Continue IV Thiamine and folic acid daily    Sandrea Hughs, MD Pulmonary and Critical Care Medicine [facility] Cell [phone] After 5:30 PM or weekends, call [phone]    .

## 2013-02-22 DIAGNOSIS — E870 Hyperosmolality and hypernatremia: Secondary | ICD-10-CM | POA: Diagnosis not present

## 2013-02-22 MED ORDER — ALBUTEROL SULFATE (5 MG/ML) 0.5% IN NEBU
2.5000 mg | INHALATION_SOLUTION | Freq: Three times a day (TID) | RESPIRATORY_TRACT | Status: DC
  Administered 2013-02-22 (×3): 2.5 mg via RESPIRATORY_TRACT
  Filled 2013-02-22 (×4): qty 0.5

## 2013-02-22 MED ORDER — PNEUMOCOCCAL VAC POLYVALENT 25 MCG/0.5ML IJ INJ
0.5000 mL | INJECTION | INTRAMUSCULAR | Status: AC
  Administered 2013-02-23: 0.5 mL via INTRAMUSCULAR
  Filled 2013-02-22: qty 0.5

## 2013-02-22 MED ORDER — POTASSIUM CHLORIDE 20 MEQ/15ML (10%) PO LIQD
40.0000 meq | Freq: Once | ORAL | Status: AC
  Administered 2013-02-22: 40 meq
  Filled 2013-02-22: qty 30

## 2013-02-22 MED ORDER — IPRATROPIUM BROMIDE 0.02 % IN SOLN
0.5000 mg | Freq: Three times a day (TID) | RESPIRATORY_TRACT | Status: DC
  Administered 2013-02-22 (×3): 0.5 mg via RESPIRATORY_TRACT
  Filled 2013-02-22 (×4): qty 2.5

## 2013-02-22 NOTE — Progress Notes (Addendum)
PULMONARY  / CRITICAL CARE MEDICINE  Name: Zachary Walls MRN: [id number] DOB: 1956-08-24    ADMISSION DATE:  02/05/2013 CONSULTATION DATE:  02/05/13  REFERRING MD :  Dr. Dede Query PRIMARY SERVICE: PCCM  CHIEF COMPLAINT:  AMS  BRIEF PATIENT DESCRIPTION: Pt was last seen normal 4/16 at 4pm. Son found pt down in fetal position complaining of breathing difficulty. Pt had suspicious visitor that may have given pt drugs. 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 Pt continued to be altered in ER 4/17 pm and PCCM asked to adlmit.   SIGNIFICANT EVENTS / STUDIES:  4/17- AMS, admitted to ICU 4/18 - cardiac arrest with 7 minutes of CPR, emergent intubation, on pressor 4/18- eeg -slow, no focus 4/18 echo - 65%, WNL, euvolemic 4/19- clinical findings concerning seizures, keppra, more alert 4/22 MRI brain: Negative for acute infract. No acute intracranial abnormality 4/24 pt failed extubation unable to protect airway reintubated > tached 5/1 G tube    LINES / TUBES: 4/18 ETT>>>4/24>>4/24 4/18 LIJ>>4/24 4/18 Foley>> (failed extraction 2/2 residual) 4/24/ Trach >>> 5/1 G Tube >>>  CULTURES: 4/17 BCx>>>neg  4/17 resp Cx>>neg 4/17 UCx>>neg 4/18 cdiff>>>neg 4/25 BCx>>>Neg 4/25 UCx>>>Neg 4/28 Resp Cx >> neg  ANTIBIOTICS: 4/17 IV zosyn>>>4/20 4/18 unasyn>>>  4/23 4/17 Vanc >>>4/20 Levaquin 4/28>>> changed to PO on 5/2, plan stop 5/6 (8 days total)  SUBJECTIVE: Alert,sitting on side of bed, frustrated re npo status  VITAL SIGNS: Temp:  [97.1 F (36.2 C)-98 F (36.7 C)] 97.5 F (36.4 C) (05/04 0840) Pulse Rate:  [57-121] 63 (05/04 0747) Resp:  [9-20] 9 (05/04 0747) BP: (78-125)/(39-64) 110/56 mmHg (05/04 0747) SpO2:  [98 %-100 %] 100 % (05/04 0840) FiO2 (%):  [28 %] 28 % (05/04 0747) Weight:  [103 lb 9.9 oz (47 kg)] 103 lb 9.9 oz (47 kg) (05/04 0416)      VENTILATOR SETTINGS: Vent Mode:  [-]  FiO2 (%):  [28 %] 28 % INTAKE /  OUTPUT: Intake/Output     05/03 0701 - 05/04 0700 05/04 0701 - 05/05 0700   Other 1500 60   NG/GT 1035    IV Piggyback 100    Total Intake(mL/kg) 2635 (56.1) 60 (1.3)   Urine (mL/kg/hr) 1075 (1) 350 (3.4)   Stool 75 (0.1)    Total Output 1150 350   Net +1485 -290         PHYSICAL EXAMINATION: General: alert male in nad. Neuro:  Mouths words,  Sitting up s difficulty on side of bed HEENT:  Nose without purulence or d/c noted.  Trach in place Cardiovascular:  Mild tachy but regular Lungs:  BS distant, a few basilar crackles, no wheezing Abdomen:  Colostomy with brown stool drainage  LE without edema, no cyanosis  LABS:  Recent Labs Lab 02/15/13 1013  02/19/13 0455 02/20/13 0445 02/21/13 0625  HGB  --   < > 11.2* 10.9* 10.7*  WBC  --   < > 8.5 6.2 6.9  PLT  --   < > 472* 408* 375  NA  --   < > 148* 150* 146*  K  --   < > 3.2* 3.2* 3.3*  CL  --   < > 107 111 109  CO2  --   < > 30 31 30   GLUCOSE  --   < > 98 93 159*  BUN  --   < > 25* 22 21  CREATININE  --   < >  1.03 1.06 1.07  CALCIUM  --   < > 9.6 9.0 8.8  MG  --   < > 2.2 2.1 2.3  PHOS  --   < > 3.5 4.0 3.4  TROPONINI <0.30  --   --   --   --   < > = values in this interval not displayed.  Recent Labs Lab 02/20/13 1935 02/20/13 2352 02/21/13 1239 02/21/13 1606 02/21/13 2035  GLUCAP 103* 134* 137* 94 86   pcxr  4/26 > Slight increase in course and lung markings over the bilateral  upper lobes. Differential considerations include developing  pneumonia, aspiration, or other alveolar filling processes such as  edema or hemorrhage.   ASSESSMENT / PLAN: Principal Problem:   Cardiac arrest Active Problems:   COPD (chronic obstructive pulmonary disease)   Severe protein-calorie malnutrition   Altered mental status   Anoxic encephalopathy   Respiratory failure, acute and chronic   Tracheostomy status  PULMONARY A: Severe emphysema with bullae; COPD      VDRF- emergent intubation following cardiac  arrest  S/p trach 4/24 - Tolerating TC well and down to 28%.  PMV traiing ongoing, very difficult to understand  P:   - Duonebs  q6h. - On tapering prednisone,  5mg  daily started on 5/3 then d/c on 5/8. - TC 24/7 and FiO2 of 28%.  Ok to bring cuff down and push PMV to help with communication - Change IV to PO levaquin for tracheobronchitis.  CARDIOVASCULAR A:  Hx of HTN  Cardiac arrest with 7 min of CPR on 4/18 Shock, post arrest -resolved Tachycardia and HTN  Likely 2/2 alcohol withdrawal?, resolved  P:  - reduced bioprolol 5/3 - also on llow dose norvasc with adequate BP control, will maintain current regimen   GASTROINTESTINAL A:  emergent Hartmann's procedure 3/13 2/2 SBO with colostomy non-revised by surgery 2/2 poor nutrition and overall medical status - CT abdomen with no intraabdominal process   Elevated Transaminases -AST elevated likely 2/2 alcohol - improving -4/21 P:   - PPI. - G-tube done per IR 5/1,  tol TF.  Renal:  A: Hypokalemia. P: Replace and recheck as needed  A Hypernatremia, improving P continue D5W and sterile water per G tube  ENDOCRINE A:  No active issues P:   - SSI.  NEUROLOGIC A:  AMS( likely delirium) from EtOH withdrawal vs opioid w/d vs. possible anoxic brain injury ( s/p cardiac arrest) that can not be ruled out.  CT head was negative.  MRI ( 4/22)- Negative for acute infarct  EEG- slowing but no focal disease, seizure seems less likely P:   - Cont-fentanyl 100 mcg patch q72h - Cont seroquel 50mg  AM and 100mg  QHS - Neuro no longer following - Continue Thiamine and folic acid daily    Sandrea Hughs, MD Pulmonary and Critical Care Medicine [facility] Cell [phone] After 5:30 PM or weekends, call [phone]    .

## 2013-02-23 MED ORDER — PANTOPRAZOLE SODIUM 40 MG PO PACK: 40.0000 mg | PACK | Freq: Every day | ORAL | Status: DC

## 2013-02-23 MED ORDER — QUETIAPINE FUMARATE 100 MG PO TABS: 100.0000 mg | ORAL_TABLET | Freq: Every day | ORAL | Status: DC

## 2013-02-23 MED ORDER — ADULT MULTIVITAMIN LIQUID CH: 5.0000 mL | Freq: Every day | ORAL | Status: DC

## 2013-02-23 MED ORDER — LEVOFLOXACIN 25 MG/ML PO SOLN: ORAL | Status: DC

## 2013-02-23 MED ORDER — PREDNISONE 10 MG PO TABS
10.0000 mg | ORAL_TABLET | Freq: Every day | ORAL | Status: DC
  Filled 2013-02-23: qty 1

## 2013-02-23 MED ORDER — THIAMINE HCL 100 MG PO TABS: 100.0000 mg | ORAL_TABLET | Freq: Every day | ORAL | Status: DC

## 2013-02-23 MED ORDER — FREE WATER: 250.0000 mL | Freq: Four times a day (QID) | Status: DC

## 2013-02-23 MED ORDER — INSULIN ASPART 100 UNIT/ML ~~LOC~~ SOLN: 0.0000 [IU] | SUBCUTANEOUS | Status: DC

## 2013-02-23 MED ORDER — FOLIC ACID 1 MG PO TABS: 1.0000 mg | ORAL_TABLET | Freq: Every day | ORAL | Status: DC

## 2013-02-23 MED ORDER — QUETIAPINE FUMARATE 50 MG PO TABS: 50.0000 mg | ORAL_TABLET | Freq: Every morning | ORAL | Status: DC

## 2013-02-23 MED ORDER — PRO-STAT SUGAR FREE PO LIQD: 30.0000 mL | Freq: Every day | ORAL | Status: DC

## 2013-02-23 MED ORDER — OSMOLITE 1.2 CAL PO LIQD: ORAL | Status: DC

## 2013-02-23 MED ORDER — BISOPROLOL FUMARATE 5 MG PO TABS: 5.0000 mg | ORAL_TABLET | Freq: Every day | ORAL | Status: DC

## 2013-02-23 MED ORDER — IBUPROFEN 100 MG/5ML PO SUSP: 400.0000 mg | Freq: Four times a day (QID) | ORAL | Status: DC | PRN

## 2013-02-23 MED ORDER — IPRATROPIUM BROMIDE 0.02 % IN SOLN: 0.5000 mg | Freq: Three times a day (TID) | RESPIRATORY_TRACT | Status: DC

## 2013-02-23 MED ORDER — FENTANYL 100 MCG/HR TD PT72: 1.0000 | MEDICATED_PATCH | TRANSDERMAL | Status: DC

## 2013-02-23 MED ORDER — PREDNISONE 5 MG/5ML PO SOLN: ORAL | Status: DC

## 2013-02-23 NOTE — Progress Notes (Signed)
Physical Therapy Treatment Patient Details Name: Zachary Walls MRN: [id number] DOB: [birth year] Today's Date: 02/23/2013 Time: 1041-1101 PT Time Calculation (min): 20 min  PT Assessment / Plan / Recommendation Comments on Treatment Session  Pt very agitated limiting ambulation this date however pt functioning at mod I level in room in limited space. Suspect patient to remain unsteady during ambulation and would require AD. Pt very irritated regarding inability to hear well and speak well despite having passy-mur valve. Pt reports son is coming to pick pt up to go home.    Follow Up Recommendations  SNF- for trach, colostomy, and PEG tube management, in addition to achieving gross mod I function for safe transition home     Does the patient have the potential to tolerate intense rehabilitation     Barriers to Discharge        Equipment Recommendations  None recommended by PT    Recommendations for Other Services    Frequency Min 3X/week   Plan Discharge plan remains appropriate    Precautions / Restrictions Precautions Precautions:  (trach) Restrictions Weight Bearing Restrictions: No   Pertinent Vitals/Pain Pt denies pain    Mobility  Bed Mobility Bed Mobility: Not assessed Transfers Transfers: Sit to Stand;Stand to Sit Sit to Stand: 6: Modified independent (Device/Increase time);With upper extremity assist;From bed Stand to Sit: 6: Modified independent (Device/Increase time);With upper extremity assist Ambulation/Gait Ambulation/Gait Assistance: Not tested (comment) (pt refused) Ambulation/Gait Assistance Details: observed patient ambulating in room within area of line length. Pt very agitated and desires to leave. Pt able to don new socks on and allow PT to assist in changing gown due to it being soiled.     Exercises     PT Diagnosis:    PT Problem List:   PT Treatment Interventions:     PT Goals Acute Rehab PT Goals PT Goal: Sit to Stand - Progress: Met PT Goal:  Stand to Sit - Progress: Met PT Goal: Ambulate - Progress: Met  Visit Information  Last PT Received On: 02/23/13 Assistance Needed: +1    Subjective Data  Subjective: Pt recieved standing at sink very irritated with desire to leave. Patient Stated Goal: home   Cognition  Cognition Arousal/Alertness: Awake/alert Behavior During Therapy: Agitated Overall Cognitive Status: Within Functional Limits for tasks assessed Area of Impairment: Safety/judgement Current Attention Level: Sustained Following Commands: Follows one step commands consistently Safety/Judgement: Decreased awareness of safety;Decreased awareness of deficits    Balance     End of Session PT - End of Session Activity Tolerance: Treatment limited secondary to agitation Patient left: in bed;with call bell/phone within reach Nurse Communication: Mobility status   GP     Marcene Brawn 02/23/2013, 11:36 AM  Lewis Shock, PT, DPT Pager #: [phone] Office #: [phone]

## 2013-02-23 NOTE — Progress Notes (Signed)
Pt sitting on side of bed very irritated waving arms.  Rh BBS, refused tx and suction.  SPO2 98% on RA.  Advised nurse of pts status.  Will check back in with pt.

## 2013-02-23 NOTE — Progress Notes (Signed)
Clinical Social Worker attempted to meet with pt at bedside.  Pt appeared agitated and was unable/unwilling to state where he is or what the current year is.  CSW encouraged pt to write to assist with communication; pt wrote "Wait till my kids get here" and "I know my kids".  CSW provided emotional support.  CSW offered to phone pt's dtr and request that she come to hospital prior to dc.  Kaysee agreeable to arriving to hospital after signing paperwork at [facility].     Angelia Mould, MSW, [place] [phone]

## 2013-02-23 NOTE — Progress Notes (Signed)
Clinical Social Worker submitted dc summary and AVS via CFP to [facility].  CSW spoke with Yvonne Kendall and informed Yvonne Kendall [phone]) that Marylene Land at [facility] will be calling her shortly to arrange paperwork; Yvonne Kendall stated understanding.  CSW to continue to follow and assist as needed.    Angelia Mould, MSW, [place] [phone]

## 2013-02-23 NOTE — Progress Notes (Signed)
Passy-Muir Speaking Valve - Treatment Patient Details  Name: Zachary Walls MRN: [id number] Date of Birth: 1956-06-06  Today's Date: 02/23/2013 Time: 4098-1191 SLP Time Calculation (min): 16 min  Past Medical History:  Past Medical History  Diagnosis Date  . COPD (chronic obstructive pulmonary disease)   . Pneumonia   . Hypertension   . HOH (hard of hearing) 12/01/2011   Past Surgical History:  Past Surgical History  Procedure Laterality Date  . Hernia repair      inguinal  . 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 Colostomy revision  12/11/2011    Procedure: COLON RESECTION SIGMOID;  Surgeon: Emelia Loron, MD;  Location: [facility];  Service: General;;  . Bowel resection  12/11/2011    Procedure: SMALL BOWEL RESECTION;  Surgeon: Emelia Loron, MD;  Location: [facility];  Service: General;;  times 2  . Cystoscopy w/ ureteral stent placement  12/11/2011    Procedure: CYSTOSCOPY WITH STENT REPLACEMENT;  Surgeon: Anner Crete, MD;  Location: [facility];  Service: Urology;  Laterality: Left;  ureteral catheter placement    Assessment / Plan / Recommendation Clinical Impression  Pt seen for PMSV tx:  RN present and valve in place upon entering room.  RN states pt has been unable to vocalize with PMSV.  Pt achieves brief phonation with initial exhalation, but unable to achieve sustained phonation (change in function since last speech tx session.)  He appears angry today.  He presents with no signs of C02 retention; is achieving oral airflow subjectively, but there are concerns given decreased ability to phonate.  Pt for possible D/C to SNF today.  Consider changing trach to cuffless prior to D/C.    Rec full supervision with use of PMSV; Cuff must be deflated; continue to monitor Sp02, RR, and HR during use.  Pt may benefit from ENT referral if phonation does not improve.       Plan  Continue with  current plan of care    Follow Up Recommendations       Pertinent Vitals/Pain     SLP Goals SLP Goal #1: Pt will tolerate PMSV for 30-60 minute durations with stable vital signs SLP Goal #1 - Progress: Progressing toward goal SLP Goal #2: Pt will achieve oral expectoration with use of valve 100% of trials. SLP Goal #2 - Progress: Progressing toward goal   PMSV Trial  PMSV was placed for: 10 min Able to redirect subglottic air through upper airway: Yes Able to Attain Phonation:  (decreased ability to achieve phonation) Voice Quality: Aphonic;Hoarse Able to Expectorate Secretions: Yes Level of Secretion Expectoration with PMSV: Oral;Tracheal Breath Support for Phonation: Mildly decreased Intelligibility: Intelligibility reduced Word: 25-49% accurate Conversation: 0-24% accurate Respirations During Trial: 20 SpO2 During Trial: 96 % Pulse During Trial: 72 Behavior: Angry   Tracheostomy Tube    #6 Shiley cuffed with cuff deflated   Vent Dependency  FiO2 (%): 28 %    Cuff Deflation Trial  GO    Ceaser Ebeling L. [place], [place] CCC/SLP Pager [phone]  Tolerated Cuff Deflation: Yes   [name] 02/23/2013, 2:24 PM

## 2013-02-23 NOTE — Discharge Summary (Signed)
Physician Discharge Summary  Patient ID: Zachary Walls MRN: [id number] DOB/AGE: 07/07/1956 57 y.o.  Admit date: 02/05/2013 Discharge date: 02/23/2013    Discharge Diagnoses:  Principal Problem:   Cardiac arrest Active Problems:   COPD (chronic obstructive pulmonary disease)   Severe protein-calorie malnutrition   Altered mental status   Anoxic encephalopathy   Respiratory failure, acute and chronic   Tracheostomy status   Hypernatremia    Brief Summary: Zachary Walls is a 57 y.o. y/o male with a PMH of significant ETOH, COPD, HTN found down by son for unknown period of time, last seen normal 24 hours prior to be found.  Admitted with AMS, respiratory failure and required intubation after cardiac arrest.  Subsequently failed extubation and required trach/Gtube. Course c/b by ETOH withdrawal, delirium. Now liberated from vent on ATC and ready for d/c to SNF.   SIGNIFICANT EVENTS / STUDIES:  4/17- AMS, admitted to ICU  4/18 - cardiac arrest with 7 minutes of CPR, emergent intubation, on pressor  4/18- eeg -slow, no focus  4/18 echo - 65%, WNL, euvolemic  4/19- clinical findings concerning seizures, keppra, more alert  4/22 MRI brain: Negative for acute infract. No acute intracranial abnormality  4/24 pt failed extubation unable to protect airway reintubated > trached  5/1 G tube   LINES / TUBES:  4/18 ETT>>>4/24>>4/24  4/18 LIJ>>4/24  4/18 Foley>>  4/24/ Trach >>>  5/1 G Tube >>>   CULTURES:  4/17 BCx>>>neg  4/17 resp Cx>>neg  4/17 UCx>>neg  4/18 cdiff>>>neg  4/25 BCx>>>Neg  4/25 UCx>>>Neg  4/28 Resp Cx >> neg   ANTIBIOTICS:  4/17 IV zosyn>>>4/20  4/18 unasyn>>> 4/23  4/17 Vanc >>>4/20  Levaquin 4/28>>> changed to PO on 5/2, plan stop 5/6 (8 days total)                                                                    Hospital Summary by Discharge Diagnosis  Acute respiratory failure/VDRF (emergent intubation following cardiac arrest)- s/p trach 4/24 Severe  bullous emphysema  AECOPD Difficult to ventilate after intubation with very little air movement r/t AECOPD in setting severe emphysema - this likely cause of cardiac arrest.  Failed extubation as above, now s/p trach and liberated from vent tol ATC at all times.   Tol some intermittent PMV but will need cont ST f/u.  His AECOPD has been rx with steroids, abx, nebs.  Will cont nebs at d/c and complete steroid taper.  Received abx for ?aspiration and new fevers.  Will complete second course abx on 5/6. All cultures neg as above.   Cardiac arrest - CPR  Shock - post arrest - likely r/t autopeep and severe resp and metabolic acidosis  Tachycardia/HTN - likely r/t ETOH withdrawal  Likely respiratory event which precipitated cardiac arrest.  See cardiac studies listed above.   Recent SBO (12/2011) with emergent exp lap and colostomy  Severe protein calorie malnutrition  Transaminitis - in setting severe ETOH - improving Severe Dysphagia/ aspiration  Followed by ST.  FEES with severe dysphagia, not safe for PO's.  Now s/p G tube with TF and protein supplement.  Previously had revision of colostomy planned.  Will need to f/u with gen surgery, ?he is still a  candidate for this.   Hypernatremia  Improving.  Cont free water with frequent f/u chem.   AMS/ delirium  ETOH withdrawal  Slowly improving.  Cont fent patch, seroquel, thiamine/folate.  Seen in consultation by Neuro.  CT head and MRI head negative.  EEG showed slowing but no focal disease and not likely seizure.   Filed Vitals:   02/23/13 0656 02/23/13 0657 02/23/13 0720 02/23/13 0758  BP:    88/53  Pulse: 68 62 67 66  Temp:    97.7 F (36.5 C)  TempSrc:    Oral  Resp: 10 10 20 14   Height:      Weight:      SpO2: 93% 96% 98% 98%     Discharge Labs  BMET  Recent Labs Lab 02/17/13 0645 02/18/13 0704 02/19/13 0455 02/20/13 0445 02/21/13 0625  NA 147* 146* 148* 150* 146*  K 3.3* 3.0* 3.2* 3.2* 3.3*  CL 108 108 107 111  109  CO2 26 33* 30 31 30   GLUCOSE 94 117* 98 93 159*  BUN 28* 29* 25* 22 21  CREATININE 1.01 1.06 1.03 1.06 1.07  CALCIUM 9.6 9.0 9.6 9.0 8.8  MG 2.3 2.3 2.2 2.1 2.3  PHOS 3.7 3.7 3.5 4.0 3.4     CBC   Recent Labs Lab 02/19/13 0455 02/20/13 0445 02/21/13 0625  HGB 11.2* 10.9* 10.7*  HCT 32.9* 32.6* 31.4*  WBC 8.5 6.2 6.9  PLT 472* 408* 375   Anti-Coagulation No results found for this basename: INR,  in the last 168 hours         Follow-up Information   Follow up with Siy-Hian, Whitman Hero, MD. Schedule an appointment as soon as possible for a visit in 57 weeks. (post d/c from SNF )    Contact information:   [address] [place] [phone]       Follow up with Emelia Loron, MD. Call in 1 month.   Contact information:   [address] [place] [address] [phone]          Medication List    STOP taking these medications       COMBIVENT RESPIMAT 20-100 MCG/ACT Aers respimat  Generic drug:  Ipratropium-Albuterol     escitalopram 10 MG tablet  Commonly known as:  LEXAPRO     HYDROcodone-acetaminophen 5-325 MG per tablet  Commonly known as:  NORCO/VICODIN     lamoTRIgine 25 MG tablet  Commonly known as:  LAMICTAL     lisinopril-hydrochlorothiazide 20-25 MG per tablet  Commonly known as:  PRINZIDE,ZESTORETIC     LORazepam 0.5 MG tablet  Commonly known as:  ATIVAN     losartan 50 MG tablet  Commonly known as:  COZAAR     tiotropium 18 MCG inhalation capsule  Commonly known as:  SPIRIVA      TAKE these medications       bisoprolol 5 MG tablet  Commonly known as:  ZEBETA  Take 1 tablet (5 mg total) by mouth daily.     feeding supplement (OSMOLITE 1.2 CAL) Liqd  53ml/hr     feeding supplement Liqd  Place 30 mLs into feeding tube daily at 12 noon.     fentaNYL 100 MCG/HR  Commonly known as:  DURAGESIC - dosed mcg/hr  Place 1 patch (100 mcg total) onto the skin every 3 (three) days.     folic  acid 1 MG tablet  Commonly known as:  FOLVITE  Place 1 tablet (1  mg total) into feeding tube daily.     free water Soln  Place 250 mLs into feeding tube every 6 (six) hours.     ibuprofen 100 MG/5ML suspension  Commonly known as:  ADVIL,MOTRIN  Place 20 mLs (400 mg total) into feeding tube every 6 (six) hours as needed for fever.     insulin aspart 100 UNIT/ML injection  Commonly known as:  novoLOG  Inject 0-9 Units into the skin every 4 (four) hours.     ipratropium 0.02 % nebulizer solution  Commonly known as:  ATROVENT  Take 2.5 mLs (0.5 mg total) by nebulization 3 (three) times daily.     levofloxacin 25 MG/ML solution  Commonly known as:  LEVAQUIN  500mg  per tube daily - stop date 5/6     multivitamin Liqd  Place 5 mLs into feeding tube daily.     pantoprazole sodium 40 mg/20 mL Pack  Commonly known as:  PROTONIX  Place 20 mLs (40 mg total) into feeding tube daily at 12 noon.     predniSONE 5 MG/5ML solution  5mg  per tube daily x 3 days then stop     QUEtiapine 100 MG tablet  Commonly known as:  SEROQUEL  Take 1 tablet (100 mg total) by mouth at bedtime.     QUEtiapine 50 MG tablet  Commonly known as:  SEROQUEL  Take 1 tablet (50 mg total) by mouth every morning.     thiamine 100 MG tablet  Take 1 tablet (100 mg total) by mouth daily.         Disposition: 01-Home or Self Care  Discharged Condition: ORVIN NETTER has met maximum benefit of inpatient care and is medically stable and cleared for discharge.  Patient is pending follow up as above.      Time spent on disposition:  Greater than 35 minutes.   SignedDanford Bad, NP 02/23/2013  11:31 AM Pager: [phone] or [phone]  *Care during the described time interval was provided by me and/or other providers on the critical care team. I have reviewed this patient's available data, including medical history, events of note, physical examination and test results as part of my  evaluation.

## 2013-02-23 NOTE — Progress Notes (Signed)
15:10 Trach changed to #6 uncuffed trach.  Good color change on ETCO2, SPO2 100%, HR 74 pt able to verbalize after change with PMV tolerated well, RT to monitor

## 2013-02-24 ENCOUNTER — Other Ambulatory Visit: Payer: Self-pay | Admitting: Geriatric Medicine

## 2013-02-24 LAB — GLUCOSE, CAPILLARY
Glucose-Capillary: 100 mg/dL — ABNORMAL HIGH (ref 70–99)
Glucose-Capillary: 121 mg/dL — ABNORMAL HIGH (ref 70–99)
Glucose-Capillary: 77 mg/dL (ref 70–99)
Glucose-Capillary: 97 mg/dL (ref 70–99)

## 2013-02-24 MED ORDER — FENTANYL 100 MCG/HR TD PT72: 1.0000 | MEDICATED_PATCH | TRANSDERMAL | Status: DC

## 2013-02-25 ENCOUNTER — Other Ambulatory Visit: Payer: Self-pay | Admitting: Geriatric Medicine

## 2013-02-25 MED ORDER — FENTANYL 100 MCG/HR TD PT72: 1.0000 | MEDICATED_PATCH | TRANSDERMAL | Status: DC

## 2013-02-25 NOTE — Discharge Summary (Signed)
Patient seen and examined, agree with above note.  I dictated the care and orders written for this patient under my direction.  Rocky Gladden G Zelta Enfield, MD [phone] 

## 2013-02-28 ENCOUNTER — Non-Acute Institutional Stay (SKILLED_NURSING_FACILITY): Payer: Medicaid Other | Admitting: Internal Medicine

## 2013-02-28 DIAGNOSIS — J9620 Acute and chronic respiratory failure, unspecified whether with hypoxia or hypercapnia: Secondary | ICD-10-CM

## 2013-02-28 DIAGNOSIS — J441 Chronic obstructive pulmonary disease with (acute) exacerbation: Secondary | ICD-10-CM

## 2013-02-28 DIAGNOSIS — G609 Hereditary and idiopathic neuropathy, unspecified: Secondary | ICD-10-CM

## 2013-02-28 DIAGNOSIS — I469 Cardiac arrest, cause unspecified: Secondary | ICD-10-CM

## 2013-03-05 ENCOUNTER — Other Ambulatory Visit (HOSPITAL_COMMUNITY): Payer: Self-pay | Admitting: Internal Medicine

## 2013-03-05 DIAGNOSIS — R1310 Dysphagia, unspecified: Secondary | ICD-10-CM

## 2013-03-06 NOTE — Progress Notes (Signed)
Patient ID: Zachary Walls, male   DOB: 1956/04/23, 57 y.o.   MRN: [id number]           HISTORY & PHYSICAL  DATE:  02/27/2013  FACILITY: [facility]   LEVEL OF CARE:   SNF   CHIEF COMPLAINT:  Status post admission to [facility], 02/05/2013 through 02/23/2013.   HISTORY OF PRESENT ILLNESS:  Mr. Allnutt is a 57 year-old man who lives on his own property in [place].  He has a history of COPD, alcohol abuse, tobacco abuse.  He was found down in his own home for an unknown period of time.  He was admitted to the hospital in respiratory failure and was ultimately intubated after suffering a cardiac arrest  apparently on his way from the ER up to the floor.  He subsequently failed extubation and required a trach tube.  His hospitalization was complicated by alcohol withdrawal and delirium.    Other issues include abdominal swelling, apparent failure of a Speech Therapy review in hospital, currently PEG dependent.    PROCEDURES/INVESTIGATIONS:   Echo showed 65% ejection fraction, within normal limits.   MRI of the brain was negative.  No acute abnormality.    PAST MEDICAL HISTORY/PROBLEM LIST:  Respiratory failure secondary to COPD acute.   Cardiac arrest, probably secondary to #1, requiring intubation and tracheostomy.     Severe protein calorie malnutrition in the setting of chronic alcohol abuse.    Altered mental status, acute delirium, alcohol withdrawal, now improving.    Anoxic encephalopathy.    Hypernatremia.    Dysphagia, the exact nature of this uncertain.  Apparently failed a swallowing study and now is PEG tube dependent.    He has apparently a colostomy dating back a year ago.  He  apparently had a partial colectomy secondary to "an infection in his colon", per his daughter.   There is no information on his current chart about this.    Hard of hearing.  Uses hearing aids.    History of hernia repairs, inguinal.    History of small bowel resection.   Cystoscopy with  stent replacement (?ureteral).     SOCIAL HISTORY: HOUSING:  As mentioned, the patient lived on his own property in [place].   FUNCTIONAL STATUS:  He required some assistance from family members for grocery shopping, financial management.  No prior history of gait ataxia.  He was not on oxygen.   CODE STATUS:  He comes here as a Full Code.    FAMILY HISTORY: None related by the patient.    REVIEW OF SYSTEMS:   CHEST/RESPIRATORY:  He is not complaining of shortness of breath.   CARDIAC:   No clear chest pain.   GI:  No abdominal pain.  No vomiting.  However, he has tender lower abdominal swelling.    PHYSICAL EXAMINATION:   VITAL SIGNS:   RESPIRATIONS:  20.   PULSE:  82.   GENERAL APPEARANCE:  Frail man who looks older than his stated age.   HEENT:  MOUTH/THROAT:   Poor dentition.  No lesions seen.   NECK/THYROID:  Tracheostomy with a Passy-Muir valve.   LYMPHATICS:  None palpable in the cervical, clavicular or axillary areas.   CHEST/RESPIRATORY:  Air entry is shallow.  Slightly prolonged expiratory phase, mild expiratory wheezing.   CARDIOVASCULAR:  CARDIAC:   Heart sounds are distant.  No murmurs.  No increase in jugular venous pressure.  He appears to be euvolemic.    GASTROINTESTINAL:  ABDOMEN:  Large midline abdominal scar.  He has a colostomy in the left lower quadrant which is draining liquid material.   HERNIA:  On the right side, he has an indirect inguinal hernia which is not reducing.   CIRCULATION:   EDEMA/VARICOSITIES:  Extremities:  No edema.  NEUROLOGICAL:   CRANIAL NERVES:  Cranial nerves seem intact.   MOTOR:  No pronator drift.  SENSATION/STRENGTH:  Strength in his lower extremities is normal.   DEEP TENDON REFLEXES:  Reflexes are present.  Toes are downgoing.  BALANCE/GAIT:  He gets himself out of bed.   Mildly wide-based.  Balance testing is normal.  No Romberg's.   PSYCHIATRIC:   MENTAL STATUS:   Makes a lot of errors.  For instance, says he had the  colostomy 10 years ago which in reality was last year.   Family states there were memory issues even prior to this acute hospitalization.     ASSESSMENT/PLAN:  Chronic respiratory failure, now with a tracheostomy secondary to advanced COPD.  He is on an Atrovent nebulizer three times a day.  I do not see why he is not on a beta agonist and I will add this, as well.  He completed a prednisone taper.   Alcoholism.  He is on thiamine.  Went through withdrawal.    Cognitive issues.  This predated this admission.  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    Gait ataxia.  I suspect this is perhaps medical illness neuropathy.  He appears to be making recovery.  Still complains of lower extremity numbness and some balance problems.    Cardiac arrest in the setting of acute respiratory failure, which was probably the issue.  He does not have listed cardiac issues.     I am going to send him to the Tracheostomy Clinic at [facility].  He has a PEG tube.  He will need Speech Therapy to determine whether he can actually transition to a diet, currently completely PEG tube dependent.  He apparently failed a swallowing test in the hospital.  I will order cognitive testing.  I will need to look up exactly what prompted the colostomy.  Apparently, he was felt to be a candidate for reverse at one point.  However, this was not done due to alcohol abuse, tobacco abuse, etc.     CPT CODE: 16109

## 2013-03-08 ENCOUNTER — Emergency Department (HOSPITAL_COMMUNITY)
Admission: EM | Admit: 2013-03-08 | Discharge: 2013-03-08 | Disposition: A | Discharge status: Home / Self Care | Payer: Medicaid Other | Attending: Emergency Medicine | Admitting: Emergency Medicine

## 2013-03-08 ENCOUNTER — Encounter (HOSPITAL_COMMUNITY): Payer: Self-pay

## 2013-03-08 DIAGNOSIS — J4489 Other specified chronic obstructive pulmonary disease: Secondary | ICD-10-CM | POA: Insufficient documentation

## 2013-03-08 DIAGNOSIS — Z794 Long term (current) use of insulin: Secondary | ICD-10-CM | POA: Insufficient documentation

## 2013-03-08 DIAGNOSIS — Z8669 Personal history of other diseases of the nervous system and sense organs: Secondary | ICD-10-CM | POA: Insufficient documentation

## 2013-03-08 DIAGNOSIS — Z8701 Personal history of pneumonia (recurrent): Secondary | ICD-10-CM | POA: Insufficient documentation

## 2013-03-08 DIAGNOSIS — Z79899 Other long term (current) drug therapy: Secondary | ICD-10-CM | POA: Insufficient documentation

## 2013-03-08 DIAGNOSIS — IMO0002 Reserved for concepts with insufficient information to code with codable children: Secondary | ICD-10-CM | POA: Insufficient documentation

## 2013-03-08 DIAGNOSIS — I10 Essential (primary) hypertension: Secondary | ICD-10-CM | POA: Insufficient documentation

## 2013-03-08 DIAGNOSIS — F17200 Nicotine dependence, unspecified, uncomplicated: Secondary | ICD-10-CM | POA: Insufficient documentation

## 2013-03-08 DIAGNOSIS — Z430 Encounter for attention to tracheostomy: Secondary | ICD-10-CM | POA: Insufficient documentation

## 2013-03-08 DIAGNOSIS — J449 Chronic obstructive pulmonary disease, unspecified: Secondary | ICD-10-CM | POA: Insufficient documentation

## 2013-03-08 NOTE — ED Notes (Signed)
Called for transport

## 2013-03-08 NOTE — ED Notes (Signed)
Pt presents via EMS from [facility] with c/o trach dislodging. Pt says his trach came out some time in the middle of the night and the facility put another one in but it isn't the right size. Pt here to get the right size trach put back in.  Pt c/o sore throat.

## 2013-03-08 NOTE — ED Notes (Signed)
[facility] called for transport.  

## 2013-03-08 NOTE — ED Provider Notes (Signed)
History    57 year old male sent from [facility] for evaluation after tracheostomy was dislodged. This came out at some point during the middle of night. He previously had a #6 Shiley cuffless trach in place. Staff was only able to place a #4. Patient currently with no acute complaints.   CSN: [id number]  Arrival date & time 03/08/13  4098   First MD Initiated Contact with Patient 03/08/13 0813      Chief Complaint  Patient presents with  . Tracheostomy Tube Change    (Consider location/radiation/quality/duration/timing/severity/associated sxs/prior treatment) HPI  Past Medical History  Diagnosis Date  . COPD (chronic obstructive pulmonary disease)   . Pneumonia   . Hypertension   . HOH (hard of hearing) 12/01/2011    Past Surgical History  Procedure Laterality Date  . Hernia repair      inguinal  . 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 Colostomy revision  12/11/2011    Procedure: COLON RESECTION SIGMOID;  Surgeon: Emelia Loron, MD;  Location: [facility];  Service: General;;  . Bowel resection  12/11/2011    Procedure: SMALL BOWEL RESECTION;  Surgeon: Emelia Loron, MD;  Location: [facility];  Service: General;;  times 2  . Cystoscopy w/ ureteral stent placement  12/11/2011    Procedure: CYSTOSCOPY WITH STENT REPLACEMENT;  Surgeon: Anner Crete, MD;  Location: [facility];  Service: Urology;  Laterality: Left;  ureteral catheter placement    Family History  Problem Relation Age of Onset  . Heart attack Father   . Cancer Mother   . HIV Brother     History  Substance Use Topics  . Smoking status: Current Every Day Smoker    Last Attempt to Quit: 11/03/2011  . Smokeless tobacco: Never Used  . Alcohol Use: Yes     Comment: occasionally      Review of Systems  All systems reviewed and negative, other than as noted in HPI.   Allergies  Bee venom; Shellfish allergy; and  Codeine  Home Medications   Current Outpatient Rx  Name  Route  Sig  Dispense  Refill  . bisoprolol (ZEBETA) 5 MG tablet   Oral   Take 1 tablet (5 mg total) by mouth daily.         . feeding supplement (PRO-STAT SUGAR FREE 64) LIQD   Per Tube   Place 30 mLs into feeding tube daily at 12 noon.   900 mL      . fentaNYL (DURAGESIC - DOSED MCG/HR) 100 MCG/HR   Transdermal   Place 1 patch (100 mcg total) onto the skin every 3 (three) days.   10 patch   0   . folic acid (FOLVITE) 1 MG tablet   Per Tube   Place 1 tablet (1 mg total) into feeding tube daily.         [name] ibuprofen (ADVIL,MOTRIN) 100 MG/5ML suspension   Per Tube   Place 20 mLs (400 mg total) into feeding tube every 6 (six) hours as needed for fever.         . insulin aspart (NOVOLOG) 100 UNIT/ML injection   Subcutaneous   Inject 0-9 Units into the skin every 4 (four) hours.   1 vial      . ipratropium (ATROVENT) 0.02 % nebulizer solution   Nebulization   Take 2.5 mLs (0.5 mg total) by nebulization 3 (three) times daily.  75 mL      . levofloxacin (LEVAQUIN) 25 MG/ML solution      500mg  per tube daily - stop date 5/6   75 mL      . Multiple Vitamin (MULTIVITAMIN) LIQD   Per Tube   Place 5 mLs into feeding tube daily.         . Nutritional Supplements (FEEDING SUPPLEMENT, OSMOLITE 1.2 CAL,) LIQD      46ml/hr         . pantoprazole sodium (PROTONIX) 40 mg/20 mL PACK   Per Tube   Place 20 mLs (40 mg total) into feeding tube daily at 12 noon.   30 each      . predniSONE 5 MG/5ML solution      5mg  per tube daily x 3 days then stop   100 mL      . QUEtiapine (SEROQUEL) 100 MG tablet   Oral   Take 1 tablet (100 mg total) by mouth at bedtime.         [name] QUEtiapine (SEROQUEL) 50 MG tablet   Oral   Take 1 tablet (50 mg total) by mouth every morning.         . thiamine 100 MG tablet   Oral   Take 1 tablet (100 mg total) by mouth daily.         . Water For Irrigation, Sterile  (FREE WATER) SOLN   Per Tube   Place 250 mLs into feeding tube every 6 (six) hours.           BP 133/80  Pulse 65  Temp(Src) 98.4 F (36.9 C) (Oral)  Resp 26  SpO2 99%  Physical Exam  Nursing note and vitals reviewed. Constitutional: He appears well-developed and well-nourished. No distress.  HENT:  Head: Normocephalic.  #4 Shiley cuffless trach in place and capped. Phonation somewhat harsh but clearly understandable.   Eyes: Conjunctivae are normal. Right eye exhibits no discharge. Left eye exhibits no discharge.  Neck: Neck supple.  Cardiovascular: Normal rate, regular rhythm and normal heart sounds.  Exam reveals no gallop and no friction rub.   No murmur heard. Pulmonary/Chest: Effort normal. No respiratory distress. He has wheezes.  Scattered wheezing b/l. No increased wob.   Abdominal: Soft. He exhibits no distension. There is no tenderness.  Gastric tube, colostomy  Musculoskeletal: He exhibits no edema and no tenderness.  Neurological: He is alert.  Skin: Skin is warm and dry.  Psychiatric: He has a normal mood and affect. His behavior is normal. Thought content normal.    ED Course  Procedures (including critical care time)  Labs Reviewed - No data to display No results found.    1. Encounter for tracheostomy tube change       MDM  57 year old male presenting for trach change. A #6 Shiley uncuffed trach was easily replaced.        Raeford Razor, MD 03/10/13 1030

## 2013-03-09 ENCOUNTER — Ambulatory Visit (HOSPITAL_COMMUNITY)
Admission: RE | Admit: 2013-03-09 | Discharge: 2013-03-09 | Disposition: A | Discharge status: Home / Self Care | Payer: Medicaid Other | Source: Ambulatory Visit | Attending: Internal Medicine | Admitting: Internal Medicine

## 2013-03-09 DIAGNOSIS — R1313 Dysphagia, pharyngeal phase: Secondary | ICD-10-CM | POA: Insufficient documentation

## 2013-03-09 DIAGNOSIS — R1319 Other dysphagia: Secondary | ICD-10-CM | POA: Insufficient documentation

## 2013-03-09 DIAGNOSIS — R1310 Dysphagia, unspecified: Secondary | ICD-10-CM

## 2013-03-09 NOTE — Procedures (Signed)
Objective Swallowing Evaluation: Modified Barium Swallowing Study  Patient Details  Name: Zachary Walls MRN: [id number] Date of Birth: June 22, 1956  Today's Date: 03/09/2013 Time: 3235-5732 SLP Time Calculation (min): 34 min  Past Medical History:  Past Medical History  Diagnosis Date  . COPD (chronic obstructive pulmonary disease)   . Pneumonia   . Hypertension   . HOH (hard of hearing) 12/01/2011   Past Surgical History:  Past Surgical History  Procedure Laterality Date  . Hernia repair      inguinal  . 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 Colostomy revision  12/11/2011    Procedure: COLON RESECTION SIGMOID;  Surgeon: Emelia Loron, MD;  Location: [facility];  Service: General;;  . Bowel resection  12/11/2011    Procedure: SMALL BOWEL RESECTION;  Surgeon: Emelia Loron, MD;  Location: [facility];  Service: General;;  times 2  . Cystoscopy w/ ureteral stent placement  12/11/2011    Procedure: CYSTOSCOPY WITH STENT REPLACEMENT;  Surgeon: Anner Crete, MD;  Location: [facility];  Service: Urology;  Laterality: Left;  ureteral catheter placement   HPI:  57 y.o. male with significant ETOH abuse, recent hospitalization 01/2013 for cardiac arrest 4/18 with seven minutes of CPR, VDRF, with subsequent tracheostomy (now capped), dysphagia. Most recent FEES complete 4/29 while inpatient and indicated a severe oropharyngeal dysphagia with pharyngeal edema and recommendations for NPO. PEG placed.  Current MBS ordered by SNF due to general non-compliance with NPO diet recommendations, to determine least restrictive diet.      Assessment / Plan / Recommendation Clinical Impression  Dysphagia Diagnosis: Moderate pharyngeal phase dysphagia;Mild cervical esophageal phase dysphagia Clinical impression: Patient presents with a moderate pharyngeal and mild cervical esophageal based dysphagia characterized by  delalyed swallow initiation combined with laryngeal weakness and midly decreased UES relaxation resulting in mild-moderate pharyngeal residuals post swallow which intermittently are penetrated post swallow. Cued throat clear and dry swallow effective to clear penetrates however patient requires max cueing for completion due to poor sustained attention to task. Additonally, SLP cues for multiple dry swallows assists to clear pharyngeal residuals. Aspiration risk remains moderately high given silent nature of penetration and inconsistent ability to follow through with compensatory strategies. Thorough education complete with patient who verblized understanding of risk however wishes to proceed with a po diet. Recommend mechanical soft, thin liquid with strict aspiration precautions and f/u with SNF SLP for training on compensatory strategies.     Treatment Recommendation  Defer treatment plan to SLP at (Comment) (SNF)    Diet Recommendation Dysphagia 3 (Mechanical Soft);Thin liquid   Liquid Administration via: Cup;No straw Medication Administration: Crushed with puree Supervision: Patient able to self feed;Full supervision/cueing for compensatory strategies Compensations: Slow rate;Small sips/bites;Multiple dry swallows after each bite/sip;Clear throat intermittently (clear throat and dry swallow every 1-2 sips of liquid) Postural Changes and/or Swallow Maneuvers: Seated upright 90 degrees    Other  Recommendations Oral Care Recommendations: Oral care BID   Follow Up Recommendations  Skilled Nursing facility       Pertinent Vitals/Pain None reported        General HPI: 57 y.o. male with significant ETOH abuse, recent hospitalization 01/2013 for cardiac arrest 4/18 with seven minutes of CPR, VDRF, with subsequent tracheostomy (now capped), dysphagia. Most recent FEES complete 4/29 while inpatient and indicated a severe oropharyngeal dysphagia with pharyngeal edema and recommendations for NPO. PEG  placed.  Current MBS ordered by SNF due to general non-compliance with NPO diet recommendations, to determine least restrictive diet.  Type of Study: Modified Barium Swallowing Study Reason for Referral: Objectively evaluate swallowing function Previous Swallow Assessment: see HPI Diet Prior to this Study: NPO (although non-compliant, reports drinking thin liquids) Temperature Spikes Noted: No Respiratory Status: Trach (no O2) History of Recent Intubation: Yes Length of Intubations (days): 6 days Date extubated: 02/12/13 Behavior/Cognition: Alert;Cooperative;Pleasant mood;Distractible;Requires cueing;Decreased sustained attention (verbose) Oral Cavity - Dentition: Poor condition Oral Motor / Sensory Function: Within functional limits Self-Feeding Abilities: Able to feed self Patient Positioning: Upright in chair Baseline Vocal Quality: Hoarse Volitional Cough: Strong Volitional Swallow: Able to elicit Anatomy: Within functional limits Pharyngeal Secretions: Not observed secondary MBS    Reason for Referral Objectively evaluate swallowing function   Oral Phase Oral Preparation/Oral Phase Oral Phase: WFL   Pharyngeal Phase Pharyngeal Phase Pharyngeal Phase: Impaired Pharyngeal - Nectar Pharyngeal - Nectar Teaspoon: Delayed swallow initiation;Premature spillage to valleculae;Reduced anterior laryngeal mobility;Reduced laryngeal elevation;Reduced tongue base retraction;Penetration/Aspiration after swallow;Pharyngeal residue - valleculae;Pharyngeal residue - pyriform sinuses Penetration/Aspiration details (nectar teaspoon): Material enters airway, remains ABOVE vocal cords and not ejected out Pharyngeal - Nectar Cup: Delayed swallow initiation;Premature spillage to valleculae;Reduced anterior laryngeal mobility;Reduced laryngeal elevation;Reduced tongue base retraction;Penetration/Aspiration after swallow;Pharyngeal residue - valleculae;Pharyngeal residue - pyriform  sinuses Penetration/Aspiration details (nectar cup): Material enters airway, remains ABOVE vocal cords and not ejected out Pharyngeal - Thin Pharyngeal - Ice Chips: Not tested Pharyngeal - Thin Teaspoon: Delayed swallow initiation;Premature spillage to pyriform sinuses;Reduced anterior laryngeal mobility;Reduced laryngeal elevation;Reduced airway/laryngeal closure;Reduced tongue base retraction;Penetration/Aspiration after swallow;Pharyngeal residue - valleculae;Pharyngeal residue - pyriform sinuses Penetration/Aspiration details (thin teaspoon): Material enters airway, remains ABOVE vocal cords and not ejected out Pharyngeal - Thin Cup: Delayed swallow initiation;Premature spillage to pyriform sinuses;Reduced anterior laryngeal mobility;Reduced laryngeal elevation;Reduced airway/laryngeal closure;Penetration/Aspiration during swallow;Penetration/Aspiration after swallow;Pharyngeal residue - valleculae;Pharyngeal residue - pyriform sinuses Penetration/Aspiration details (thin cup): Material enters airway, CONTACTS cords and not ejected out Pharyngeal - Solids Pharyngeal - Puree: Delayed swallow initiation;Premature spillage to valleculae;Pharyngeal residue - valleculae;Reduced anterior laryngeal mobility;Reduced laryngeal elevation;Reduced tongue base retraction Penetration/Aspiration details (puree): Material does not enter airway Pharyngeal - Mechanical Soft: Delayed swallow initiation;Premature spillage to valleculae;Reduced anterior laryngeal mobility;Reduced laryngeal elevation;Reduced tongue base retraction;Pharyngeal residue - valleculae  Cervical Esophageal Phase    GO    Cervical Esophageal Phase Cervical Esophageal Phase: Impaired Cervical Esophageal Phase - Nectar Nectar Teaspoon: Reduced cricopharyngeal relaxation Nectar Cup: Reduced cricopharyngeal relaxation Cervical Esophageal Phase - Thin Thin Teaspoon: Reduced cricopharyngeal relaxation Thin Cup: Esophageal backflow into  the pharynx Cervical Esophageal Phase - Solids Puree: Reduced cricopharyngeal relaxation Mechanical Soft: Reduced cricopharyngeal relaxation    Functional Assessment Tool Used: skilled clinical judgement Functional Limitations: Swallowing Swallow Current Status ([id number]): At least 40 percent but less than 60 percent impaired, limited or restricted Swallow Goal Status [phone]): At least 40 percent but less than 60 percent impaired, limited or restricted Swallow Discharge Status [phone]): At least 40 percent but less than 60 percent impaired, limited or restricted   [facility] MA, CCC-SLP [phone]  Ferdinand Lango Meryl 03/09/2013, 1:08 PM

## 2013-03-25 ENCOUNTER — Ambulatory Visit (INDEPENDENT_AMBULATORY_CARE_PROVIDER_SITE_OTHER)
Admission: RE | Admit: 2013-03-25 | Discharge: 2013-03-25 | Disposition: A | Discharge status: Home / Self Care | Payer: Medicaid Other | Source: Ambulatory Visit | Attending: Internal Medicine | Admitting: Internal Medicine

## 2013-03-25 ENCOUNTER — Ambulatory Visit (INDEPENDENT_AMBULATORY_CARE_PROVIDER_SITE_OTHER): Payer: Medicaid Other | Admitting: Internal Medicine

## 2013-03-25 ENCOUNTER — Encounter: Payer: Self-pay | Admitting: Internal Medicine

## 2013-03-25 VITALS — BP 132/70 | HR 82 | Temp 100.4°F | Ht 66.75 in | Wt 107.2 lb

## 2013-03-25 DIAGNOSIS — J9620 Acute and chronic respiratory failure, unspecified whether with hypoxia or hypercapnia: Secondary | ICD-10-CM

## 2013-03-25 DIAGNOSIS — J449 Chronic obstructive pulmonary disease, unspecified: Secondary | ICD-10-CM

## 2013-03-25 DIAGNOSIS — Z930 Tracheostomy status: Secondary | ICD-10-CM

## 2013-03-25 DIAGNOSIS — J4489 Other specified chronic obstructive pulmonary disease: Secondary | ICD-10-CM

## 2013-03-25 NOTE — Patient Instructions (Signed)
Zachary Walls at [facility] interventional radiology is the one who needs to remove your G tube since he put it in call  [phone]  Ok to remove the trach and place gauze dressing over it.  The key is to stop smoking completely before smoking completely stops you!   Please remember to go to the  x-ray department downstairs for your tests - we will call you with the results when they are available.    Return to Dr Cliffton Asters for your primary care, pulmonary follow up is as needed and shouldn't be needed if you quit smoking

## 2013-03-25 NOTE — Progress Notes (Signed)
  Subjective:    Patient ID: Zachary Walls, male    DOB: March 23, 1956  MRN: [id number]  HPI  28 yowm smoker seen 03/25/2013 p hosp  Admit date: 02/05/2013  Discharge date: 02/23/2013  Discharge Diagnoses:  Principal Problem:  Cardiac arrest  Active Problems:  COPD (chronic obstructive pulmonary disease)  Severe protein-calorie malnutrition  Altered mental status  Anoxic encephalopathy  Respiratory failure, acute and chronic  Tracheostomy status  Hypernatremia  03/26/2013 f/u ov/Zachary Walls post hosp f/u  Chief Complaint  Patient presents with  . Pulmonary Consult    Pt here for trach removal    denies sob, keeps trach plugged 24/7 and does not require suctioning  Sleeping ok without nocturnal  or early am exacerbation  of respiratory  c/o's or need for noct saba. Also denies any obvious fluctuation of symptoms with weather or environmental changes or other aggravating or alleviating factors except as outlined above    Review of Systems  Constitutional: Negative for fever, chills, activity change, appetite change and unexpected weight change.  HENT: Positive for trouble swallowing. Negative for congestion, sore throat, rhinorrhea, sneezing, dental problem, voice change and postnasal drip.   Eyes: Negative for visual disturbance.  Respiratory: Positive for cough and shortness of breath. Negative for choking.   Cardiovascular: Negative for chest pain and leg swelling.  Gastrointestinal: Negative for nausea, vomiting and abdominal pain.  Genitourinary: Negative for difficulty urinating.  Musculoskeletal: Negative for arthralgias.  Skin: Negative for rash.  Psychiatric/Behavioral: Negative for behavioral problems and confusion.       Objective:   Physical Exam  dissheveled amb talkative wm with fully plugged trach and no stridor, good voice texture  Wt Readings from Last 3 Encounters:  03/25/13 107 lb 3.2 oz (48.626 kg)  02/23/13 107 lb 12.9 oz (48.9 kg)  01/15/13 115 lb (52.164 kg)     HEENT mild turbinate edema.  Oropharynx no thrush or excess pnd or cobblestoning.  No JVD or cervical adenopathy. Mild accessory muscle hypertrophy. Trachea midline, nl thryroid. Chest was hyperinflated by percussion with diminished breath sounds and slt  increased exp time with trace wheeze. Hoover sign positive at mid inspiration. Regular rate and rhythm without murmur gallop or rub or increase P2 or edema.  Abd: no hsm, nl excursion. Ext warm without cyanosis or clubbing.     CXR  03/25/2013 :  1. Chronic emphysematous changes. 2. Resolution of upper lobe infiltrates.      Assessment & Plan:

## 2013-03-26 ENCOUNTER — Telehealth (HOSPITAL_COMMUNITY): Payer: Self-pay | Admitting: Interventional Radiology

## 2013-03-26 ENCOUNTER — Other Ambulatory Visit (HOSPITAL_COMMUNITY): Payer: Self-pay | Admitting: Interventional Radiology

## 2013-03-26 DIAGNOSIS — J441 Chronic obstructive pulmonary disease with (acute) exacerbation: Secondary | ICD-10-CM

## 2013-03-26 DIAGNOSIS — Z930 Tracheostomy status: Secondary | ICD-10-CM

## 2013-03-26 DIAGNOSIS — I10 Essential (primary) hypertension: Secondary | ICD-10-CM

## 2013-03-26 DIAGNOSIS — Z933 Colostomy status: Secondary | ICD-10-CM

## 2013-03-26 DIAGNOSIS — R633 Feeding difficulties: Secondary | ICD-10-CM

## 2013-03-26 NOTE — Assessment & Plan Note (Signed)
Adequate control on present rx, reviewed  

## 2013-03-26 NOTE — Assessment & Plan Note (Signed)
Appears to have resolved with no indication for trach > ok to d/c

## 2013-03-27 ENCOUNTER — Ambulatory Visit (HOSPITAL_COMMUNITY)
Admission: RE | Admit: 2013-03-27 | Discharge: 2013-03-27 | Disposition: A | Discharge status: Home / Self Care | Payer: Medicaid Other | Source: Ambulatory Visit | Attending: Interventional Radiology | Admitting: Interventional Radiology

## 2013-03-27 DIAGNOSIS — R633 Feeding difficulties: Secondary | ICD-10-CM

## 2013-03-30 ENCOUNTER — Telehealth: Payer: Self-pay | Admitting: *Deleted

## 2013-03-30 NOTE — Telephone Encounter (Signed)
Zachary Walls([facility])  stated that speech therapy will not be going out to see patient. There is no diagnose to treat patient per Medicaid gidemlines. Nursing evaluation will continue therapy.

## 2013-04-01 ENCOUNTER — Ambulatory Visit (HOSPITAL_COMMUNITY)
Admission: RE | Admit: 2013-04-01 | Discharge: 2013-04-01 | Disposition: A | Discharge status: Home / Self Care | Payer: Medicaid Other | Source: Ambulatory Visit | Attending: Internal Medicine | Admitting: Internal Medicine

## 2013-04-01 DIAGNOSIS — Z430 Encounter for attention to tracheostomy: Secondary | ICD-10-CM | POA: Insufficient documentation

## 2013-04-01 DIAGNOSIS — Z930 Tracheostomy status: Secondary | ICD-10-CM

## 2013-04-01 NOTE — Progress Notes (Signed)
Trach Clinic Progress Note  S: Feels much better, keeps trach capped 24/7 for the past week.  O: Thin but well appearing, NAD. VSS-AF, sat of 98% on RA with trach capped. Hrt: RRR, Nl S1/S2, -M/R/G. Lung: Bibasilar crackles. Abd: Soft, NT, ND and +BS. Ext: -edema and -tenderness. Neuro: Stable, moving all ext to command.  A/P: 57 year old male s/p cardiac arrest with prolonged support requiring tracheostomy placement, now much improved.  Patient takes care of himself and has had the trach capped for over a week now with a  cuffless 6.   Plan: - Decannulate patient.  - Dressing changes provided for the next 5 days.  - Patient advised and expressed understanding about dressing change and how to speak with the stoma open.  - Hold for observation x1 hr.  - Patient advised if stoma not closing in the next 48 hours to come and be evaluated and given the multiple signs of negative outcome such as infection, bleeding and change in color.  - F/U arranged with Dr. Sherene Sires in the [facility] [address], [address], [place] at 1:30 PM.  - Patient may be discharged from the trach clinic.  Alyson Reedy, M.D. [facility] Pulmonary/Critical Care Medicine.

## 2013-04-01 NOTE — Progress Notes (Addendum)
Tracheostomy Procedure Note  Zachary Walls [id number] 02-06-1956   Procedure:Decannulation     Tracheostomy Information  Trach Brand: Shiley Size: 6.0 Style: Uncuffed Secured by: Velcro  Evaluation:  ETCO2 positive color change from yellow to purple : no/ pt decannulated Vital signs:blood pressure  136/97  Pulse 89 , respirations  16 and pulse oximetry 98 % Patients current condition: stable Complications: No apparent complications Trach site exam: clean, dry Wound care done: 4 x 4 gauze Patient did tolerate procedure well.  Patient decannulated ,vitals stable,able to cough up secretions and spit in cup. HR 90 RR 15 BP 128/82 O2 SAT 94%  On RA after decannulation. Prior to discharge HR  85,RR 12 BP 130/77 O2 SAT 99% on RA.    Education: Patient shown how to change dressing and given vasoline  dressing and extra gauze and tape to take home.  Additional needs:  Patient has appointment with Dr. Sherene Sires Monday 04/06/2013 at 1330 in [place] office.

## 2013-04-02 NOTE — Progress Notes (Signed)
[place] Note:  lmomtcb on pt's home and cell #s ______ 

## 2013-04-06 ENCOUNTER — Ambulatory Visit: Payer: Medicaid Other | Admitting: Internal Medicine

## 2013-04-10 ENCOUNTER — Ambulatory Visit (HOSPITAL_COMMUNITY)
Admission: RE | Admit: 2013-04-10 | Discharge: 2013-04-10 | Disposition: A | Discharge status: Home / Self Care | Payer: Medicaid Other | Source: Ambulatory Visit | Attending: Interventional Radiology | Admitting: Interventional Radiology

## 2013-04-10 ENCOUNTER — Encounter: Payer: Self-pay | Admitting: Internal Medicine

## 2013-04-10 DIAGNOSIS — IMO0002 Reserved for concepts with insufficient information to code with codable children: Secondary | ICD-10-CM | POA: Insufficient documentation

## 2013-04-10 DIAGNOSIS — Z431 Encounter for attention to gastrostomy: Secondary | ICD-10-CM | POA: Insufficient documentation

## 2013-04-15 ENCOUNTER — Telehealth: Payer: Self-pay | Admitting: Internal Medicine

## 2013-04-15 NOTE — Telephone Encounter (Signed)
Notes Recorded by Nyoka Cowden, MD on 03/25/2013 at 4:36 PM Call pt: Reviewed cxr and no acute change so no change in recommendations made at [facility] with the pt's daughter and notified of results and she verbalized understanding and will inform the pt

## 2013-05-03 ENCOUNTER — Other Ambulatory Visit: Payer: Self-pay | Admitting: Adult Health

## 2013-05-18 ENCOUNTER — Other Ambulatory Visit: Payer: Self-pay | Admitting: Adult Health

## 2013-08-10 ENCOUNTER — Encounter (HOSPITAL_COMMUNITY): Payer: Self-pay | Admitting: Emergency Medicine

## 2013-08-10 ENCOUNTER — Emergency Department (HOSPITAL_COMMUNITY): Payer: Medicaid Other

## 2013-08-10 ENCOUNTER — Emergency Department (HOSPITAL_COMMUNITY)
Admission: EM | Admit: 2013-08-10 | Discharge: 2013-08-10 | Disposition: A | Discharge status: Home / Self Care | Payer: Medicaid Other | Attending: Emergency Medicine | Admitting: Emergency Medicine

## 2013-08-10 DIAGNOSIS — J4489 Other specified chronic obstructive pulmonary disease: Secondary | ICD-10-CM | POA: Insufficient documentation

## 2013-08-10 DIAGNOSIS — R509 Fever, unspecified: Secondary | ICD-10-CM | POA: Insufficient documentation

## 2013-08-10 DIAGNOSIS — F1010 Alcohol abuse, uncomplicated: Secondary | ICD-10-CM | POA: Insufficient documentation

## 2013-08-10 DIAGNOSIS — R209 Unspecified disturbances of skin sensation: Secondary | ICD-10-CM | POA: Insufficient documentation

## 2013-08-10 DIAGNOSIS — Z79899 Other long term (current) drug therapy: Secondary | ICD-10-CM | POA: Insufficient documentation

## 2013-08-10 DIAGNOSIS — R451 Restlessness and agitation: Secondary | ICD-10-CM

## 2013-08-10 DIAGNOSIS — Z91199 Patient's noncompliance with other medical treatment and regimen due to unspecified reason: Secondary | ICD-10-CM | POA: Insufficient documentation

## 2013-08-10 DIAGNOSIS — Z8701 Personal history of pneumonia (recurrent): Secondary | ICD-10-CM | POA: Insufficient documentation

## 2013-08-10 DIAGNOSIS — Z9114 Patient's other noncompliance with medication regimen: Secondary | ICD-10-CM

## 2013-08-10 DIAGNOSIS — Z9119 Patient's noncompliance with other medical treatment and regimen: Secondary | ICD-10-CM | POA: Insufficient documentation

## 2013-08-10 DIAGNOSIS — Z885 Allergy status to narcotic agent status: Secondary | ICD-10-CM | POA: Insufficient documentation

## 2013-08-10 DIAGNOSIS — I10 Essential (primary) hypertension: Secondary | ICD-10-CM | POA: Insufficient documentation

## 2013-08-10 DIAGNOSIS — R259 Unspecified abnormal involuntary movements: Secondary | ICD-10-CM | POA: Insufficient documentation

## 2013-08-10 DIAGNOSIS — F17200 Nicotine dependence, unspecified, uncomplicated: Secondary | ICD-10-CM | POA: Insufficient documentation

## 2013-08-10 DIAGNOSIS — H9190 Unspecified hearing loss, unspecified ear: Secondary | ICD-10-CM | POA: Insufficient documentation

## 2013-08-10 DIAGNOSIS — Z933 Colostomy status: Secondary | ICD-10-CM | POA: Insufficient documentation

## 2013-08-10 DIAGNOSIS — L299 Pruritus, unspecified: Secondary | ICD-10-CM | POA: Insufficient documentation

## 2013-08-10 DIAGNOSIS — IMO0002 Reserved for concepts with insufficient information to code with codable children: Secondary | ICD-10-CM | POA: Insufficient documentation

## 2013-08-10 DIAGNOSIS — R079 Chest pain, unspecified: Secondary | ICD-10-CM | POA: Insufficient documentation

## 2013-08-10 DIAGNOSIS — J449 Chronic obstructive pulmonary disease, unspecified: Secondary | ICD-10-CM | POA: Insufficient documentation

## 2013-08-10 LAB — BASIC METABOLIC PANEL
BUN: <3 mg/dL — ABNORMAL LOW (ref 6–23)
Calcium: 9.8 mg/dL (ref 8.4–10.5)
Creatinine, Ser: 0.8 mg/dL (ref 0.50–1.35)
GFR calc non Af Amer: >90 mL/min (ref >90)
Glucose, Bld: 114 mg/dL — ABNORMAL HIGH (ref 70–99)
Sodium: 133 mEq/L — ABNORMAL LOW (ref 135–145)

## 2013-08-10 LAB — CBC WITH DIFFERENTIAL/PLATELET
Eosinophils Absolute: 0.1 10*3/uL (ref 0.0–0.7)
Eosinophils Relative: 1 % (ref 0–5)
HCT: 44.8 % (ref 39.0–52.0)
Lymphs Abs: 1.9 10*3/uL (ref 0.7–4.0)
MCH: 31.4 pg (ref 26.0–34.0)
MCV: 90.9 fL (ref 78.0–100.0)
Monocytes Absolute: 0.7 10*3/uL (ref 0.1–1.0)
Platelets: 204 10*3/uL (ref 150–400)
RBC: 4.93 MIL/uL (ref 4.22–5.81)

## 2013-08-10 LAB — HEPATIC FUNCTION PANEL
Albumin: 4 g/dL (ref 3.5–5.2)
Alkaline Phosphatase: 88 U/L (ref 39–117)
Bilirubin, Direct: 0.3 mg/dL (ref 0.0–0.3)
Indirect Bilirubin: 0.8 mg/dL (ref 0.3–0.9)
Total Bilirubin: 1.1 mg/dL (ref 0.3–1.2)

## 2013-08-10 LAB — AMMONIA: Ammonia: 17 umol/L (ref 11–60)

## 2013-08-10 LAB — URINALYSIS, ROUTINE W REFLEX MICROSCOPIC
Glucose, UA: NEGATIVE mg/dL
Leukocytes, UA: NEGATIVE
Protein, ur: NEGATIVE mg/dL
Urobilinogen, UA: 1 mg/dL (ref 0.0–1.0)

## 2013-08-10 LAB — TROPONIN I: Troponin I: <0.3 ng/mL (ref <0.30)

## 2013-08-10 MED ORDER — SODIUM CHLORIDE 0.9 % IV BOLUS (SEPSIS)
1000.0000 mL | Freq: Once | INTRAVENOUS | Status: AC
  Administered 2013-08-10: 1000 mL via INTRAVENOUS

## 2013-08-10 MED ORDER — IPRATROPIUM-ALBUTEROL 20-100 MCG/ACT IN AERS: 2.0000 | INHALATION_SPRAY | Freq: Four times a day (QID) | RESPIRATORY_TRACT | Status: DC

## 2013-08-10 MED ORDER — RISPERIDONE 0.5 MG PO TABS: 0.5000 mg | ORAL_TABLET | Freq: Two times a day (BID) | ORAL | Status: DC

## 2013-08-10 MED ORDER — LOSARTAN POTASSIUM 50 MG PO TABS: 50.0000 mg | ORAL_TABLET | Freq: Every day | ORAL | Status: DC

## 2013-08-10 MED ORDER — LISINOPRIL-HYDROCHLOROTHIAZIDE 20-25 MG PO TABS: 1.0000 | ORAL_TABLET | Freq: Every day | ORAL | Status: DC

## 2013-08-10 MED ORDER — QUETIAPINE FUMARATE 50 MG PO TABS: 50.0000 mg | ORAL_TABLET | Freq: Every morning | ORAL | Status: DC

## 2013-08-10 MED ORDER — LORAZEPAM 0.5 MG PO TABS: 0.5000 mg | ORAL_TABLET | Freq: Three times a day (TID) | ORAL | Status: DC | PRN

## 2013-08-10 MED ORDER — ESCITALOPRAM OXALATE 10 MG PO TABS: 10.0000 mg | ORAL_TABLET | Freq: Every day | ORAL | Status: DC

## 2013-08-10 MED ORDER — LORAZEPAM 2 MG/ML IJ SOLN
2.0000 mg | Freq: Once | INTRAMUSCULAR | Status: AC
  Administered 2013-08-10: 2 mg via INTRAVENOUS
  Filled 2013-08-10: qty 1

## 2013-08-10 MED ORDER — CLOTRIMAZOLE 1 % EX CREA: TOPICAL_CREAM | CUTANEOUS | Status: DC

## 2013-08-10 MED ORDER — TIOTROPIUM BROMIDE MONOHYDRATE 18 MCG IN CAPS: 18.0000 ug | ORAL_CAPSULE | Freq: Every day | RESPIRATORY_TRACT | Status: DC

## 2013-08-10 NOTE — ED Provider Notes (Signed)
CSN: [id number]     Arrival date & time 08/10/13  1135 History   First MD Initiated Contact with Patient 08/10/13 1148     Chief Complaint  Patient presents with  . Altered Mental Status   (Consider location/radiation/quality/duration/timing/severity/associated sxs/prior Treatment) HPI  Zachary Walls is a 57 y.o. male with past medical history significant for COPD, alcohol abuse. Patient is accompanied by his granddaughter who helps supply the history. Patient is a very poor historian. Patient states he developed a stutter over the last few days he's having all over body jerks and a sensation of pruritus all over his body. He reports subjective fever and chills moderate left-sided chest pain onset 3 days ago, constant, not associated with shortness of breath or diaphoresis. As per the granddaughter patient is a heavy drinker as per the patient he states he only drinks beer on the weekends. Patient states he has been out of most of his medications for variable lengths of time. Patient denies any suicidal ideation, homicidal ideation, illicit drug use. Patient defers detox from alcohol at this time.  Past Medical History  Diagnosis Date  . COPD (chronic obstructive pulmonary disease)   . Pneumonia   . Hypertension   . HOH (hard of hearing) 12/01/2011   Past Surgical History  Procedure Laterality Date  . Hernia repair      inguinal  . 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 Colostomy revision  12/11/2011    Procedure: COLON RESECTION SIGMOID;  Surgeon: Emelia Loron, MD;  Location: [facility];  Service: General;;  . Bowel resection  12/11/2011    Procedure: SMALL BOWEL RESECTION;  Surgeon: Emelia Loron, MD;  Location: [facility];  Service: General;;  times 2  . Cystoscopy w/ ureteral stent placement  12/11/2011    Procedure: CYSTOSCOPY WITH STENT REPLACEMENT;  Surgeon: Anner Crete, MD;  Location:  [facility];  Service: Urology;  Laterality: Left;  ureteral catheter placement   Family History  Problem Relation Age of Onset  . Heart attack Father   . Cancer Father     unknown type  . HIV Brother    History  Substance Use Topics  . Smoking status: Current Every Day Smoker -- 0.50 packs/day for 44 years    Types: Cigarettes    Last Attempt to Quit: 11/03/2011  . Smokeless tobacco: Never Used  . Alcohol Use: No    Review of Systems 10 systems reviewed and found to be negative, except as noted in the HPI  Allergies  Bee venom; Shellfish allergy; and Codeine  Home Medications   Current Outpatient Rx  Name  Route  Sig  Dispense  Refill  . albuterol (PROVENTIL) (2.5 MG/3ML) 0.083% nebulizer solution   Nebulization   Take 2.5 mg by nebulization 3 (three) times daily.         . bisoprolol (ZEBETA) 5 MG tablet   Oral   Take 1 tablet (5 mg total) by mouth daily.         . Multiple Vitamin (MULTIVITAMIN) LIQD   Per Tube   Place 5 mLs into feeding tube daily.         . Nutritional Supplements (FEEDING SUPPLEMENT, OSMOLITE 1.2 CAL,) LIQD      41ml/hr         . pantoprazole sodium (PROTONIX) 40 mg/20 mL PACK   Per Tube   Place 20 mLs (40  mg total) into feeding tube daily at 12 noon.   30 each      . QUEtiapine (SEROQUEL) 100 MG tablet   Oral   Take 1 tablet (100 mg total) by mouth at bedtime.         [name] QUEtiapine (SEROQUEL) 50 MG tablet   Oral   Take 1 tablet (50 mg total) by mouth every morning.         . thiamine 100 MG tablet   Oral   Take 1 tablet (100 mg total) by mouth daily.          BP 164/100  Pulse 93  Temp(Src) 98.7 F (37.1 C) (Oral)  Resp 26  SpO2 97% Physical Exam  Nursing note and vitals reviewed. Constitutional: He is oriented to person, place, and time. He appears well-developed and well-nourished.  Hard of hearing, patient has stuttering.  HENT:  Head: Normocephalic.  Mouth/Throat: Oropharynx is clear and moist.  Tongue  fasciculations  Eyes: Conjunctivae and EOM are normal. Pupils are equal, round, and reactive to light.  Neck: Normal range of motion. Neck supple.  Cardiovascular: Normal rate, regular rhythm and intact distal pulses.   Pulmonary/Chest: Effort normal and breath sounds normal. No stridor. No respiratory distress. He has no wheezes. He has no rales. He exhibits no tenderness.  Abdominal: Soft. Bowel sounds are normal. He exhibits no distension and no mass. There is no tenderness. There is no rebound and no guarding.  Colostomy in place with stool output, no skin irritation or breakdown.   Musculoskeletal: Normal range of motion.  Neurological: He is alert and oriented to person, place, and time. No cranial nerve deficit.  Psychiatric:  Agitated    ED Course  Procedures (including critical care time) Labs Review Labs Reviewed  CBC WITH DIFFERENTIAL - Abnormal; Notable for the following:    Basophils Relative 2 (*)    All other components within normal limits  BASIC METABOLIC PANEL - Abnormal; Notable for the following:    Sodium 133 (*)    Chloride 91 (*)    Glucose, Bld 114 (*)    BUN <3 (*)    All other components within normal limits  HEPATIC FUNCTION PANEL - Abnormal; Notable for the following:    AST 119 (*)    ALT 96 (*)    All other components within normal limits  CG4 I-STAT (LACTIC ACID) - Abnormal; Notable for the following:    Lactic Acid, Venous 2.46 (*)    All other components within normal limits  AMMONIA  TROPONIN I  ETHANOL  URINALYSIS, ROUTINE W REFLEX MICROSCOPIC   Imaging Review Dg Chest 2 View  08/10/2013   CLINICAL DATA:  Smoker, difficulty speaking  EXAM: CHEST  2 VIEW  COMPARISON:  03/25/2013  FINDINGS: Hyperinflation again noted. Stable central chronic mild bronchitic changes. No acute infiltrate the or pulmonary edema. Bony thorax is stable.  IMPRESSION: No active disease. Hyperinflation again noted. Stable central chronic mild bronchitic changes.    Electronically Signed   By: Natasha Mead M.D.   On: 08/10/2013 12:35   Ct Head Wo Contrast  08/10/2013   CLINICAL DATA:  Headache  EXAM: CT HEAD WITHOUT CONTRAST  TECHNIQUE: Contiguous axial images were obtained from the base of the skull through the vertex without intravenous contrast.  COMPARISON:  02/09/2013; 02/05/2013  FINDINGS: The brainstem, cerebellum, cerebral peduncles, thalamus, basal ganglia, basilar cisterns, and ventricular system appear within normal limits. No intracranial hemorrhage, mass lesion, or acute CVA. Mild  bifrontal atrophy. Small left mastoid effusion.  IMPRESSION: 1. No acute intracranial findings. 2. Mild bifrontal atrophy 3. Small left mastoid effusion.   Electronically Signed   By: Herbie Baltimore M.D.   On: 08/10/2013 13:25    EKG Interpretation     Ventricular Rate:  94 PR Interval:  128 QRS Duration: 90 QT Interval:  356 QTC Calculation: 445 R Axis:   91 Text Interpretation:  Sinus rhythm with marked sinus arrhythmia Biatrial enlargement Rightward axis Pulmonary disease pattern No significant change since last tracing            MDM   1. Agitation   2. Non compliance w medication regimen   3. Alcohol abuse      Filed Vitals:   08/10/13 1143 08/10/13 1501  BP: 164/100 136/88  Pulse: 93 94  Temp: 98.7 F (37.1 C)   TempSrc: Oral   Resp: 26 18  SpO2: 97% 100%     Zachary Walls is a 57 y.o. male with multiple complaints, noncompliant with medication, history of alcohol abuse, appears to be withdrawing from alcohol. Patient is given Ativan with improvement in his symptoms. Neuro exam is nonfocal. Head CT is negative EKG is nonischemic, troponin negative blood work shows no gross abnormalities. This is a shared visit with the attending physician who personally evaluated the patient and agrees with the care plan. I have refilled all of the patient's medication and have advised the granddaughter that he will need to follow with primary care. Return  cautions discussed.  Medications  LORazepam (ATIVAN) injection 2 mg (2 mg Intravenous Given 08/10/13 1413)  sodium chloride 0.9 % bolus 1,000 mL (0 mLs Intravenous Stopped 08/10/13 1525)    Pt is hemodynamically stable, appropriate for, and amenable to discharge at this time. Pt verbalized understanding and agrees with care plan. All questions answered. Outpatient follow-up and specific return precautions discussed.    Discharge Medication List as of 08/10/2013  2:54 PM    START taking these medications   Details  clotrimazole (LOTRIMIN) 1 % cream Apply to affected area 2 times daily, Print    escitalopram (LEXAPRO) 10 MG tablet Take 1 tablet (10 mg total) by mouth daily., Starting 08/10/2013, Until Discontinued, Print    Ipratropium-Albuterol (COMBIVENT RESPIMAT) 20-100 MCG/ACT AERS respimat Inhale 2 puffs into the lungs 4 (four) times daily., Starting 08/10/2013, Until Discontinued, Print    lisinopril-hydrochlorothiazide (PRINZIDE,ZESTORETIC) 20-25 MG per tablet Take 1 tablet by mouth daily., Starting 08/10/2013, Until Discontinued, Print    LORazepam (ATIVAN) 0.5 MG tablet Take 1 tablet (0.5 mg total) by mouth every 8 (eight) hours as needed for anxiety., Starting 08/10/2013, Until Discontinued, Print    losartan (COZAAR) 50 MG tablet Take 1 tablet (50 mg total) by mouth daily., Starting 08/10/2013, Until Discontinued, Print    QUEtiapine (SEROQUEL) 50 MG tablet Take 1 tablet (50 mg total) by mouth every morning., Starting 08/10/2013, Until Discontinued, Print    risperiDONE (RISPERDAL) 0.5 MG tablet Take 1 tablet (0.5 mg total) by mouth 2 (two) times daily., Starting 08/10/2013, Until Discontinued, Print    tiotropium (SPIRIVA HANDIHALER) 18 MCG inhalation capsule Place 1 capsule (18 mcg total) into inhaler and inhale daily., Starting 08/10/2013, Until Discontinued, Print        Note: Portions of this report may have been transcribed using voice recognition software. Every  effort was made to ensure accuracy; however, inadvertent computerized transcription errors may be present      Wynetta Emery, PA-C 08/11/13 [phone]

## 2013-08-10 NOTE — ED Notes (Signed)
Patient transported to X-ray 

## 2013-08-10 NOTE — ED Notes (Signed)
Pt states he is having all over body jerks, feeling hot and cold, trouble sleeping due to pt feels he may not wake up, pt states he feels like he can't grt his words out, increase bruising, bilateral leg weakness, increase weakness to Right leg, pt also reports pain to his Left chest starting Saturday.  Per granddaughter; Amber pt was recently admitted here and states "he died in the elevator and they had to give him a shot when he arrived to the floor." They were also concerned he may have been poisoned with rat poison but no they were never given any results.  Amber also states her "grandfather is an alcoholic and will drink anything to get a buzz."

## 2013-08-10 NOTE — ED Notes (Signed)
Patient is resting comfortably. 

## 2013-08-11 NOTE — ED Provider Notes (Signed)
Medical screening examination/treatment/procedure(s) were conducted as a shared visit with non-physician practitioner(s) and myself.  I personally evaluated the patient during the encounter Patient presenting with multiple vague complaints over multiple systems and is a poor historian. Patient is a heavy consumer of alcohol that is ongoing and long-standing. Today he it appears that patient has run out of his medications and has not been taking any for at least several days and does not have an appointment with his PCP. He is complaining of stuttering, feeling his skin crawl and scrotal itching.  On physical exam no abnormalities could be found. Feel that his symptoms are probably most related to his heavy alcohol consumption and not taking his medications. After a dose of Ativan his stuttering completely resolved.  Gwyneth Sprout, MD 08/11/13 1022

## 2013-08-25 ENCOUNTER — Encounter (HOSPITAL_COMMUNITY): Payer: Self-pay | Admitting: Nurse Practitioner

## 2013-08-25 ENCOUNTER — Inpatient Hospital Stay (HOSPITAL_COMMUNITY)
Admission: EM | Admit: 2013-08-25 | Discharge: 2013-08-27 | DRG: 392 | Disposition: A | Discharge status: Home / Self Care | Payer: Medicaid Other | Source: Ambulatory Visit | Attending: Cardiovascular Disease | Admitting: Cardiovascular Disease

## 2013-08-25 ENCOUNTER — Encounter (HOSPITAL_COMMUNITY)
Admission: EM | Disposition: A | Discharge status: Home / Self Care | Payer: Self-pay | Source: Ambulatory Visit | Attending: Cardiovascular Disease

## 2013-08-25 DIAGNOSIS — I200 Unstable angina: Secondary | ICD-10-CM

## 2013-08-25 DIAGNOSIS — R079 Chest pain, unspecified: Secondary | ICD-10-CM | POA: Insufficient documentation

## 2013-08-25 DIAGNOSIS — K219 Gastro-esophageal reflux disease without esophagitis: Principal | ICD-10-CM | POA: Diagnosis present

## 2013-08-25 DIAGNOSIS — F1010 Alcohol abuse, uncomplicated: Secondary | ICD-10-CM | POA: Diagnosis present

## 2013-08-25 DIAGNOSIS — J438 Other emphysema: Secondary | ICD-10-CM | POA: Diagnosis present

## 2013-08-25 DIAGNOSIS — R4182 Altered mental status, unspecified: Secondary | ICD-10-CM

## 2013-08-25 DIAGNOSIS — H9190 Unspecified hearing loss, unspecified ear: Secondary | ICD-10-CM | POA: Diagnosis present

## 2013-08-25 DIAGNOSIS — J449 Chronic obstructive pulmonary disease, unspecified: Secondary | ICD-10-CM | POA: Diagnosis present

## 2013-08-25 DIAGNOSIS — Z79899 Other long term (current) drug therapy: Secondary | ICD-10-CM

## 2013-08-25 DIAGNOSIS — Z91038 Other insect allergy status: Secondary | ICD-10-CM

## 2013-08-25 DIAGNOSIS — F1920 Other psychoactive substance dependence, uncomplicated: Secondary | ICD-10-CM

## 2013-08-25 DIAGNOSIS — I2510 Atherosclerotic heart disease of native coronary artery without angina pectoris: Secondary | ICD-10-CM | POA: Diagnosis present

## 2013-08-25 DIAGNOSIS — F1210 Cannabis abuse, uncomplicated: Secondary | ICD-10-CM | POA: Diagnosis present

## 2013-08-25 DIAGNOSIS — Z8674 Personal history of sudden cardiac arrest: Secondary | ICD-10-CM

## 2013-08-25 DIAGNOSIS — R631 Polydipsia: Secondary | ICD-10-CM | POA: Diagnosis not present

## 2013-08-25 DIAGNOSIS — Z91013 Allergy to seafood: Secondary | ICD-10-CM

## 2013-08-25 DIAGNOSIS — Z23 Encounter for immunization: Secondary | ICD-10-CM

## 2013-08-25 DIAGNOSIS — F17200 Nicotine dependence, unspecified, uncomplicated: Secondary | ICD-10-CM | POA: Diagnosis present

## 2013-08-25 DIAGNOSIS — I10 Essential (primary) hypertension: Secondary | ICD-10-CM | POA: Diagnosis present

## 2013-08-25 DIAGNOSIS — E46 Unspecified protein-calorie malnutrition: Secondary | ICD-10-CM | POA: Diagnosis present

## 2013-08-25 DIAGNOSIS — Z681 Body mass index (BMI) 19 or less, adult: Secondary | ICD-10-CM

## 2013-08-25 DIAGNOSIS — E871 Hypo-osmolality and hyponatremia: Secondary | ICD-10-CM

## 2013-08-25 HISTORY — DX: Atherosclerotic heart disease of native coronary artery without angina pectoris: I25.10

## 2013-08-25 HISTORY — DX: Failed or difficult intubation, initial encounter: T88.4XXA

## 2013-08-25 HISTORY — DX: Other psychoactive substance abuse, uncomplicated: F19.10

## 2013-08-25 HISTORY — PX: CARDIAC CATHETERIZATION: SHX172

## 2013-08-25 HISTORY — DX: Tobacco use: Z72.0

## 2013-08-25 HISTORY — DX: Hypo-osmolality and hyponatremia: E87.1

## 2013-08-25 HISTORY — DX: Alcohol abuse, uncomplicated: F10.10

## 2013-08-25 HISTORY — DX: Cardiac arrest, cause unspecified: I46.9

## 2013-08-25 HISTORY — DX: Personal history of pneumonia (recurrent): Z87.01

## 2013-08-25 HISTORY — PX: LEFT HEART CATHETERIZATION WITH CORONARY ANGIOGRAM: SHX5451

## 2013-08-25 HISTORY — DX: Unspecified protein-calorie malnutrition: E46

## 2013-08-25 HISTORY — DX: Diverticulitis of intestine, part unspecified, without perforation or abscess without bleeding: K57.92

## 2013-08-25 LAB — ETHANOL: Alcohol, Ethyl (B): 14 mg/dL — ABNORMAL HIGH (ref 0–11)

## 2013-08-25 LAB — COMPREHENSIVE METABOLIC PANEL
ALT: 21 U/L (ref 0–53)
AST: 41 U/L — ABNORMAL HIGH (ref 0–37)
Albumin: 3.5 g/dL (ref 3.5–5.2)
Alkaline Phosphatase: 62 U/L (ref 39–117)
CO2: 21 mEq/L (ref 19–32)
Chloride: 86 mEq/L — ABNORMAL LOW (ref 96–112)
GFR calc Af Amer: >90 mL/min (ref >90)
Glucose, Bld: 71 mg/dL (ref 70–99)
Potassium: 3.6 mEq/L (ref 3.5–5.1)
Sodium: 125 mEq/L — ABNORMAL LOW (ref 135–145)
Total Bilirubin: 0.6 mg/dL (ref 0.3–1.2)

## 2013-08-25 LAB — PROTIME-INR: Prothrombin Time: 13.4 seconds (ref 11.6–15.2)

## 2013-08-25 LAB — APTT: aPTT: 31 seconds (ref 24–37)

## 2013-08-25 LAB — CBC
HCT: 39.8 % (ref 39.0–52.0)
Hemoglobin: 14.6 g/dL (ref 13.0–17.0)
MCV: 87.1 fL (ref 78.0–100.0)
Platelets: 297 10*3/uL (ref 150–400)
RBC: 4.57 MIL/uL (ref 4.22–5.81)
RDW: 13.4 % (ref 11.5–15.5)
WBC: 8.4 10*3/uL (ref 4.0–10.5)

## 2013-08-25 LAB — TROPONIN I: Troponin I: <0.3 ng/mL (ref <0.30)

## 2013-08-25 LAB — LIPID PANEL
HDL: 107 mg/dL (ref >39)
LDL Cholesterol: 44 mg/dL (ref 0–99)
Total CHOL/HDL Ratio: 1.6 RATIO
Triglycerides: 79 mg/dL (ref <150)

## 2013-08-25 LAB — CK TOTAL AND CKMB (NOT AT ARMC)
Relative Index: 2.8 — ABNORMAL HIGH (ref 0.0–2.5)
Total CK: 103 U/L (ref 7–232)

## 2013-08-25 LAB — HEMOGLOBIN A1C: Mean Plasma Glucose: 100 mg/dL (ref <117)

## 2013-08-25 LAB — MRSA PCR SCREENING: MRSA by PCR: NEGATIVE

## 2013-08-25 SURGERY — LEFT HEART CATHETERIZATION WITH CORONARY ANGIOGRAM
Anesthesia: LOCAL

## 2013-08-25 MED ORDER — VERAPAMIL HCL 2.5 MG/ML IV SOLN
INTRAVENOUS | Status: AC
  Filled 2013-08-25: qty 2

## 2013-08-25 MED ORDER — ADULT MULTIVITAMIN W/MINERALS CH
1.0000 | ORAL_TABLET | Freq: Every day | ORAL | Status: DC
  Administered 2013-08-25 – 2013-08-27 (×3): 1 via ORAL
  Filled 2013-08-25 (×3): qty 1

## 2013-08-25 MED ORDER — HEPARIN SODIUM (PORCINE) 1000 UNIT/ML IJ SOLN
INTRAMUSCULAR | Status: AC
  Filled 2013-08-25: qty 1

## 2013-08-25 MED ORDER — NITROGLYCERIN 0.4 MG SL SUBL: 0.4000 mg | SUBLINGUAL_TABLET | SUBLINGUAL | Status: DC | PRN

## 2013-08-25 MED ORDER — NITROGLYCERIN 0.2 MG/ML ON CALL CATH LAB
INTRAVENOUS | Status: AC
  Filled 2013-08-25: qty 1

## 2013-08-25 MED ORDER — TIOTROPIUM BROMIDE MONOHYDRATE 18 MCG IN CAPS
18.0000 ug | ORAL_CAPSULE | Freq: Every day | RESPIRATORY_TRACT | Status: DC
  Administered 2013-08-26 – 2013-08-27 (×2): 18 ug via RESPIRATORY_TRACT
  Filled 2013-08-25: qty 5

## 2013-08-25 MED ORDER — LIDOCAINE HCL (PF) 1 % IJ SOLN
INTRAMUSCULAR | Status: AC
  Filled 2013-08-25: qty 30

## 2013-08-25 MED ORDER — HEPARIN SODIUM (PORCINE) 5000 UNIT/ML IJ SOLN
5000.0000 [IU] | Freq: Three times a day (TID) | INTRAMUSCULAR | Status: DC
  Administered 2013-08-25 – 2013-08-27 (×5): 5000 [IU] via SUBCUTANEOUS
  Filled 2013-08-25 (×8): qty 1

## 2013-08-25 MED ORDER — HEPARIN (PORCINE) IN NACL 2-0.9 UNIT/ML-% IJ SOLN
INTRAMUSCULAR | Status: AC
  Filled 2013-08-25: qty 1000

## 2013-08-25 MED ORDER — LORAZEPAM 0.5 MG PO TABS: 0.5000 mg | ORAL_TABLET | Freq: Three times a day (TID) | ORAL | Status: DC | PRN

## 2013-08-25 MED ORDER — ASPIRIN EC 81 MG PO TBEC
81.0000 mg | DELAYED_RELEASE_TABLET | Freq: Every day | ORAL | Status: DC
  Administered 2013-08-26 – 2013-08-27 (×2): 81 mg via ORAL
  Filled 2013-08-25 (×2): qty 1

## 2013-08-25 MED ORDER — IPRATROPIUM-ALBUTEROL 20-100 MCG/ACT IN AERS
2.0000 | INHALATION_SPRAY | Freq: Four times a day (QID) | RESPIRATORY_TRACT | Status: DC
  Administered 2013-08-25 – 2013-08-27 (×5): 2 via RESPIRATORY_TRACT
  Filled 2013-08-25 (×2): qty 4

## 2013-08-25 MED ORDER — SODIUM CHLORIDE 0.9 % IV SOLN: 250.0000 mL | INTRAVENOUS | Status: DC | PRN

## 2013-08-25 MED ORDER — THIAMINE HCL 100 MG/ML IJ SOLN
100.0000 mg | Freq: Every day | INTRAMUSCULAR | Status: DC
  Filled 2013-08-25 (×3): qty 1

## 2013-08-25 MED ORDER — ESCITALOPRAM OXALATE 10 MG PO TABS
10.0000 mg | ORAL_TABLET | Freq: Every day | ORAL | Status: DC
Start: 2013-08-25 — End: 2013-08-27
  Administered 2013-08-25 – 2013-08-27 (×3): 10 mg via ORAL
  Filled 2013-08-25 (×3): qty 1

## 2013-08-25 MED ORDER — SODIUM CHLORIDE 0.9 % IV SOLN: INTRAVENOUS | Status: AC

## 2013-08-25 MED ORDER — FOLIC ACID 1 MG PO TABS
1.0000 mg | ORAL_TABLET | Freq: Every day | ORAL | Status: DC
  Administered 2013-08-25 – 2013-08-27 (×3): 1 mg via ORAL
  Filled 2013-08-25 (×3): qty 1

## 2013-08-25 MED ORDER — SODIUM CHLORIDE 0.9 % IJ SOLN
3.0000 mL | Freq: Two times a day (BID) | INTRAMUSCULAR | Status: DC
  Administered 2013-08-26 – 2013-08-27 (×3): 3 mL via INTRAVENOUS

## 2013-08-25 MED ORDER — ACETAMINOPHEN 325 MG PO TABS
650.0000 mg | ORAL_TABLET | ORAL | Status: DC | PRN
  Administered 2013-08-25 – 2013-08-26 (×3): 650 mg via ORAL
  Filled 2013-08-25 (×3): qty 2

## 2013-08-25 MED ORDER — VITAMIN B-1 100 MG PO TABS
100.0000 mg | ORAL_TABLET | Freq: Every day | ORAL | Status: DC
  Administered 2013-08-25 – 2013-08-27 (×3): 100 mg via ORAL
  Filled 2013-08-25 (×3): qty 1

## 2013-08-25 MED ORDER — ONDANSETRON HCL 4 MG/2ML IJ SOLN: 4.0000 mg | Freq: Four times a day (QID) | INTRAMUSCULAR | Status: DC | PRN

## 2013-08-25 MED ORDER — ALBUTEROL SULFATE (5 MG/ML) 0.5% IN NEBU: 2.5000 mg | INHALATION_SOLUTION | RESPIRATORY_TRACT | Status: DC | PRN

## 2013-08-25 MED ORDER — SODIUM CHLORIDE 0.9 % IJ SOLN: 3.0000 mL | INTRAMUSCULAR | Status: DC | PRN

## 2013-08-25 NOTE — H&P (Signed)
Zachary Walls ID: Zachary Walls MRN: [id number], DOB/AGE: [birth year]   Admit date: 08/25/2013  Primary Physician: Viann Shove, MD Primary Cardiologist: New - seen by M. Kirke Corin, MD   Pt. Profile:  57 y/o male with prior h/o cardiac arrest in the setting of severe COPD in 01/2013, who presented via EMS today 2/2 chest pain and Inferior ST elevation on ECG.  Problem List  Past Medical History  Diagnosis Date  . COPD (chronic obstructive pulmonary disease)   . History of pneumonia   . Hypertension   . HOH (hard of hearing)   . Protein calorie malnutrition   . ETOH abuse   . Tobacco abuse   . Diverticulitis     a. s/p colon resection and colostomy  . Cardiac arrest     a. 01/2013, felt to be 2/2 severe COPD req trach;  b. 01/2013 Echo: EF 65-70% w/o rwma.    Past Surgical History  Procedure Laterality Date  . Hernia repair      inguinal  . 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 Colostomy revision  12/11/2011    Procedure: COLON RESECTION SIGMOID;  Surgeon: Emelia Loron, MD;  Location: [facility];  Service: General;;  . Bowel resection  12/11/2011    Procedure: SMALL BOWEL RESECTION;  Surgeon: Emelia Loron, MD;  Location: [facility];  Service: General;;  times 2  . Cystoscopy w/ ureteral stent placement  12/11/2011    Procedure: CYSTOSCOPY WITH STENT REPLACEMENT;  Surgeon: Anner Crete, MD;  Location: [facility];  Service: Urology;  Laterality: Left;  ureteral catheter placement     Allergies  Allergies  Allergen Reactions  . Bee Venom Shortness Of Breath and Swelling  . Shellfish Allergy Anaphylaxis  . Codeine Itching and Rash    HPI  57 year old male without prior cardiac history. In April of this year, he was admitted following cardiac arrest in the setting of severe exacerbation of COPD and emphysema. He was on the critical care service for roughly 20 days and  required emergent tracheostomy in the setting of an inability to intubate him on admission. He was discharged to a skilled nursing facility on May 5 and and subsequently discharged to home. Zachary Walls lives by himself and continues to smoke cigarettes though he says he has cut back to 2 cigarettes a day. He does have a history of alcohol abuse and says that he no longer drinks heavily but will still have one, 40 ounces beer most days of the week. He also uses marijuana most days of the week. He was seen in the emergency department on October 20 secondary to complaints of stuttering and shaking and was subsequently discharged home after no acute findings were noted.  He is somewhat of a difficult historian however he says that over the past 3 days, he's been having intermittent chest discomfort that feels like heartburn and improved when he belches. He does not think these have any associated symptoms. Today, he has been having difficulty with walking and has had multiple falls apparently. As a result, he called EMS. Upon EMS arrival, he was noted to have inferior ST segment elevation on ECG. Zachary Walls says he was not having chest pain at that time. A code STEMI was called and the Zachary Walls was taken emergently to [facility]. Here, he denies chest pain. Repeat ECG continues to show inferior ST segment  elevation.  Home Medications  Prior to Admission medications   Medication Sig Start Date End Date Taking? Authorizing Provider  albuterol (PROVENTIL) (2.5 MG/3ML) 0.083% nebulizer solution Take 2.5 mg by nebulization 3 (three) times daily.    Historical Provider, MD  clotrimazole (LOTRIMIN) 1 % cream Apply to affected area 2 times daily 08/10/13   Joni Reining Pisciotta, PA-C  escitalopram (LEXAPRO) 10 MG tablet Take 1 tablet (10 mg total) by mouth daily. 08/10/13   Nicole Pisciotta, PA-C  Ipratropium-Albuterol (COMBIVENT RESPIMAT) 20-100 MCG/ACT AERS respimat Inhale 2 puffs into the lungs 4 (four) times daily.  08/10/13   Nicole Pisciotta, PA-C  lisinopril-hydrochlorothiazide (PRINZIDE,ZESTORETIC) 20-25 MG per tablet Take 1 tablet by mouth daily. 08/10/13   Nicole Pisciotta, PA-C  LORazepam (ATIVAN) 0.5 MG tablet Take 1 tablet (0.5 mg total) by mouth every 8 (eight) hours as needed for anxiety. 08/10/13   Nicole Pisciotta, PA-C  losartan (COZAAR) 50 MG tablet Take 1 tablet (50 mg total) by mouth daily. 08/10/13   Nicole Pisciotta, PA-C  QUEtiapine (SEROQUEL) 50 MG tablet Take 1 tablet (50 mg total) by mouth every morning. 08/10/13   Nicole Pisciotta, PA-C  risperiDONE (RISPERDAL) 0.5 MG tablet Take 1 tablet (0.5 mg total) by mouth 2 (two) times daily. 08/10/13   Nicole Pisciotta, PA-C  tiotropium (SPIRIVA HANDIHALER) 18 MCG inhalation capsule Place 1 capsule (18 mcg total) into inhaler and inhale daily. 08/10/13   Joni Reining Pisciotta, PA-C    Family History  Family History  Problem Relation Age of Onset  . Heart attack Father   . Cancer Father     unknown type  . HIV Brother    Social History  History   Social History  . Marital Status: Divorced    Spouse Name: N/A    Number of Children: 2  . Years of Education: N/A   Occupational History  . Contractor     Social History Main Topics  . Smoking status: Current Every Day Smoker -- 0.50 packs/day for 44 years    Types: Cigarettes    Last Attempt to Quit: 11/03/2011  . Smokeless tobacco: Never Used     Comment: Has smoked as much as 2-3 packs/day.  Now can make a pack last a week.  . Alcohol Use: Yes     Comment: Previously drank heavily.  Admits to drinking 1-3 40 oz beers most days of the week.  . Drug Use: Yes     Comment: Smokes Marijuana "when I can get it," (most days of the week).  . Sexual Activity: Not Currently   Other Topics Concern  . Not on file   Social History Narrative   Lives in [place] by himself.  He does not work.     Review of Systems General:  No chills, fever, night sweats or weight changes.    Cardiovascular:  Positive chest pain, no dyspnea on exertion, edema, orthopnea, palpitations, paroxysmal nocturnal dyspnea. Dermatological: No rash, lesions/masses Respiratory: No cough, dyspnea Urologic: No hematuria, dysuria Abdominal:   No nausea, vomiting, diarrhea, bright red blood per rectum, melena, or hematemesis Neurologic:  No visual changes, wkns, changes in mental status. Notable for unsteady gait with falls. All other systems reviewed and are otherwise negative except as noted above.  Physical Exam  Afebrile, and 82, 16, 108/72, 97% on 2 L.  General: Pleasant, NAD. Difficult historian. Psych: Normal affect. Neuro: Alert and oriented X 3. Moves all extremities spontaneously. HEENT: Normal  Neck: Supple without bruits or JVD. Lungs:  Resp  regular diminished breath sounds bilaterally. Heart: RRR no s3, s4, or murmurs. Abdomen: Soft, non-tender, non-distended, BS + x 4.  Extremities: No clubbing, cyanosis or edema. DP/PT/Radials 2+ and equal bilaterally.  Labs  Pending   Radiology/Studies  pending  ECG  Regular sinus rhythm, 82, 1-2 mm inferior ST segment elevation with less pronounced J-point elevation in anterolateral leads.  T wave inversion in aVL. These changes are new.  ASSESSMENT AND PLAN  1. Acute inferior ST segment elevation myocardial infarction: Zachary Walls presents with a three-day history of stuttering chest pain and has been found to have inferior ST elevation. He is undergoing emergent catheterization. Is currently pain-free. We'll plan to admit him post catheterization and cycle cardiac markers. Plan to add aspirin only at this time.  No BB 2/2 h/o severe copd and emphysema s/p arrest and difficult intubation/trach in April of this year. Given history of EtOH abuse we'll await LFTs prior to initiating statin therapy.  2. Hypertension: Stable. We'll review his home meds.   3. COPD: Currently oxygenating well without dyspnea. He appears to be on inhalers  at home and we will continue these.  4. Polysubstance abuse: He will need additional counseling. Cessation has been advised.  5.  HTN:  Home meds are listed as both acei and arb.  Await pharmacy review.  Will not order at this time.  BP currently stable.  Signed, Nicolasa Ducking, NP 08/25/2013, 4:53 PM

## 2013-08-25 NOTE — CV Procedure (Signed)
    Cardiac Catheterization Procedure Note  Name: Zachary Walls MRN: [id number] DOB: March 27, 1956  Procedure: Left Heart Cath, Selective Coronary Angiography, LV angiography  Indication: Suspected inferior ST elevation myocardial infarction   Medications:  Sedation:   Contrast:  105 ml Omnipaque  Procedural details: The right groin was prepped, draped, and anesthetized with 1% lidocaine. Using modified Seldinger technique, a 5 French sheath was introduced into the right femoral artery. Standard Judkins catheters were used for coronary angiography and left ventriculography. An AR1 was used to engage the RCA which had an anterior take off.  Catheter exchanges were performed over a guidewire. There were no immediate procedural complications. The patient was transferred to the post catheterization recovery area for further monitoring.   Procedural Findings:  Hemodynamics: AO:  105/64   mmHg LV:  110/4    mmHg LVEDP: 10  mmHg  Coronary angiography: Coronary dominance: right   Left Main:  Normal.   Left Anterior Descending (LAD):  Normal in size and absent on the apex. The vessel has no significant disease. 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   1st diagonal (D1):  Small in size with minor irregularities.  2nd diagonal (D2):  Small in size with minor irregularities.  3rd diagonal (D3):  Small in size with minor irregularities.  Circumflex (LCx):  Small in size and nondominant. The vessel has no significant disease.  Ramus Intermedius:  Large in size and supplies most of the lateral wall. The vessel has no significant disease.  Right Coronary Artery: Normal in size with 60% tubular mid stenosis. The vessel is moderately calcified and  Posterior descending artery: Normal in size with no significant disease.  Posterior AV segment: Minor irregularities  Posterolateral branchs:  No significant disease  Left ventriculography: Left ventricular systolic  function is hyperdynamic , LVEF is estimated at 70 %, there is no significant mitral regurgitation . No significant wall motion abnormalities.  Final Conclusions:   1. No evidence of obstructive artery disease. 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 2. hyperdynamic LV systolic function with normal left ventricular end-diastolic pressure.  Recommendations:  The patient's presentation is not clear given his mental status. No culprit was identified for potential ST elevation MI. Would admit to ICU and try to obtain more history and labs.  Lorine Bears MD, [facility] 08/25/2013, 5:34 PM

## 2013-08-25 NOTE — H&P (Signed)
   I have seen and examined the patient. I agree with the above note with the addition of : no prior cardiac history. History of COPD and ETOH abuse. Called EMS DUE to weakness and falls. He also reported chest pain of few days duration. It was difficult to obtain accurate history from the patient as he appeared to be intoxicated. EKG was suggestive of inferior ST elevation with 1-2 mm. The EKG was repeated in the cath lab and these changes were persistent. I proceeded with cardiac catheterization which showed no evidence of obstructive CAD with hyperdynamic LV systolic function. Continue observation. Check labs and toxicology screen.  Lorine Bears MD, [facility] 08/25/2013 5:45 PM

## 2013-08-26 ENCOUNTER — Encounter (HOSPITAL_COMMUNITY): Payer: Self-pay | Admitting: Neurology

## 2013-08-26 DIAGNOSIS — R4182 Altered mental status, unspecified: Secondary | ICD-10-CM

## 2013-08-26 LAB — DRUGS OF ABUSE SCREEN W/O ALC, ROUTINE URINE
Cocaine Metabolites: NEGATIVE
Creatinine,U: 28.5 mg/dL
Phencyclidine (PCP): NEGATIVE
Propoxyphene: NEGATIVE

## 2013-08-26 LAB — LIPID PANEL
Cholesterol: 169 mg/dL (ref 0–200)
HDL: 94 mg/dL (ref >39)

## 2013-08-26 LAB — POCT I-STAT, CHEM 8
BUN: 6 mg/dL (ref 6–23)
Calcium, Ion: 1.1 mmol/L — ABNORMAL LOW (ref 1.12–1.23)
Chloride: 90 mEq/L — ABNORMAL LOW (ref 96–112)
HCT: 46 % (ref 39.0–52.0)
Hemoglobin: 15.6 g/dL (ref 13.0–17.0)
Potassium: 3.6 mEq/L (ref 3.5–5.1)
Sodium: 124 mEq/L — ABNORMAL LOW (ref 135–145)

## 2013-08-26 LAB — CBC
MCH: 32.2 pg (ref 26.0–34.0)
MCV: 88.2 fL (ref 78.0–100.0)
Platelets: 302 10*3/uL (ref 150–400)
RBC: 4.51 MIL/uL (ref 4.22–5.81)
RDW: 14.1 % (ref 11.5–15.5)
WBC: 6.6 10*3/uL (ref 4.0–10.5)

## 2013-08-26 LAB — BASIC METABOLIC PANEL
BUN: 8 mg/dL (ref 6–23)
CO2: 27 mEq/L (ref 19–32)
Calcium: 8.9 mg/dL (ref 8.4–10.5)
Creatinine, Ser: 0.97 mg/dL (ref 0.50–1.35)
GFR calc non Af Amer: >90 mL/min (ref >90)
Glucose, Bld: 78 mg/dL (ref 70–99)
Sodium: 128 mEq/L — ABNORMAL LOW (ref 135–145)

## 2013-08-26 LAB — URINALYSIS, ROUTINE W REFLEX MICROSCOPIC
Bilirubin Urine: NEGATIVE
Glucose, UA: NEGATIVE mg/dL
Hgb urine dipstick: NEGATIVE
Nitrite: NEGATIVE
Specific Gravity, Urine: 1.009 (ref 1.005–1.030)
pH: 6 (ref 5.0–8.0)

## 2013-08-26 LAB — URINE MICROSCOPIC-ADD ON

## 2013-08-26 LAB — TSH: TSH: 2.132 u[IU]/mL (ref 0.350–4.500)

## 2013-08-26 LAB — SODIUM, URINE, RANDOM: Sodium, Ur: 32 mEq/L

## 2013-08-26 LAB — TROPONIN I: Troponin I: <0.3 ng/mL (ref <0.30)

## 2013-08-26 MED ORDER — ATORVASTATIN CALCIUM 20 MG PO TABS
20.0000 mg | ORAL_TABLET | Freq: Every day | ORAL | Status: DC
Start: 2013-08-26 — End: 2013-08-27
  Administered 2013-08-26: 20 mg via ORAL
  Filled 2013-08-26 (×2): qty 1

## 2013-08-26 MED ORDER — INFLUENZA VAC SPLIT QUAD 0.5 ML IM SUSP
0.5000 mL | INTRAMUSCULAR | Status: AC
  Administered 2013-08-27: 0.5 mL via INTRAMUSCULAR
  Filled 2013-08-26: qty 0.5

## 2013-08-26 NOTE — Progress Notes (Addendum)
Subjective:  Pt states that he is feeling better this morning. He states that he feels nauseous this morning but that he always feels nauseous in the mornings. He denies chest pain.  Objective:  Vital Signs in the last 24 hours: Temp:  [98.3 F (36.8 C)-98.5 F (36.9 C)] 98.4 F (36.9 C) (11/05 0400) Pulse Rate:  [64-86] 78 (11/05 0600) Resp:  [9-23] 14 (11/05 0600) BP: (81-122)/(43-75) 102/57 mmHg (11/05 0600) SpO2:  [93 %-97 %] 95 % (11/05 0600) Weight:  [103 lb 13.4 oz (47.1 kg)-104 lb 8 oz (47.4 kg)] 103 lb 13.4 oz (47.1 kg) (11/05 0500)  Intake/Output from previous day: 11/04 0701 - 11/05 0700 In: 840 [P.O.:240; I.V.:600] Out: 1175 [Urine:1050; Stool:125]  Physical Exam: Pt is alert and oriented, NAD HEENT: Pt with difficulty hearing Neck: JVP - normal, carotids 2+= without bruits Lungs: Diffuse end expiratory wheezes with rales CV: RRR without murmur or gallop Abd: soft, NT, multiple well healed surgical scars, ostomy present with stool int eh bag Ext: no C/C/E, distal pulses intact and equal Skin: warm/dry no rash   Lab Results:  Recent Labs  08/25/13 1700 08/26/13 0159  WBC 8.4 6.6  HGB 14.6 14.5  PLT 297 302    Recent Labs  08/25/13 1700 08/26/13 0159  NA 125* 128*  K 3.6 3.5  CL 86* 91*  CO2 21 27  GLUCOSE 71 78  BUN 7 8  CREATININE 0.96 0.97    Recent Labs  08/25/13 2035 08/26/13 0159  TROPONINI <0.30 <0.30    Cardiac Studies: 08/25/13: Left heart catheterization Final Conclusions:  1. No evidence of obstructive artery disease. 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  2. hyperdynamic LV systolic function with normal left ventricular end-diastolic pressure.  Tele: Sinus rhythm, rate 85  Assessment/Plan:  57 y/o male with prior h/o cardiac arrest in the setting of severe COPD in 01/2013 and EtOH abuse who presented via EMS on 08/25/13 2/2 chest pain and was found to have inferior ST  elevation on ECG and underwent left heart catheterization.  1. Acute inferior STEMI: Patient presented with a three-day history of stuttering chest pain and has been found to have inferior ST elevation. He underwent emergent catheterization where no evidence of obstructive artery disease was noted; hyperdynamic systolic function was noted. The pt is currently pain-free, and troponins are negative. Pt is on ASA only. Holding BB 2/2 h/o severe copd and emphysema s/p arrest and difficult intubation/trach in April of this year. Only mild AST elevation, so starting statin.  3. COPD: Currently oxygenating well without dyspnea. Continued home Spirva.  4. Polysubstance abuse: UDS was positive for THC only. EtOH mildly elevated. He will need additional counseling by SW. Cessation has been advised.   5. HTN: BP currently stable. Home meds are listed as both acei and arb. Await pharmacy review. Will not order at this time.    Genelle Gather, M.D. 08/26/2013, 7:20 AM  Patient seen with resident.  Patient did not have STEMI, his ECG showed slight inferior ST elevation but looks like a variant of his typical ECG.  Cardiac enzymes not elevated, cardiac cath with nonobstructive CAD.  He is hyponatremic, likely due to drinking copious fluids and not eating much (polydipsia).  Says he is not drinking much ETOH now but may have been intoxicated yesterday. Hyponatremia may have contributed to his altered mental status yesterday.  - Would keep fluid restricted to 1.5 L  today and make sure that sodium comes up with BMET tomorrow morning.  Would obtain urine sodium and UA for SG.  - Continue ASA 81, add statin given nonobstructive CAD.  - To floor today, likely home tomorrow.   Marca Ancona 08/26/2013 8:55 AM

## 2013-08-26 NOTE — Care Management Note (Signed)
    Page 1 of 1   08/26/2013     8:14:40 AM   CARE MANAGEMENT NOTE 08/26/2013  Patient:  Zachary Walls, Zachary Walls   Account Number:  [id number]  Date Initiated:  08/26/2013  Documentation initiated by:  Junius Creamer  Subjective/Objective Assessment:   adm w angina     Action/Plan:   lives w son, pcp dr sh-haan   Anticipated DC Date:     Anticipated DC Plan:           Choice offered to / List presented to:             Status of service:   Medicare Important Message given?   (If response is "NO", the following Medicare IM given date fields will be blank) Date Medicare IM given:   Date Additional Medicare IM given:    Discharge Disposition:    Per UR Regulation:  Reviewed for med. necessity/level of care/duration of stay  If discussed at Long Length of Stay Meetings, dates discussed:    Comments:

## 2013-08-27 ENCOUNTER — Encounter (HOSPITAL_COMMUNITY): Payer: Self-pay | Admitting: Physician Assistant

## 2013-08-27 ENCOUNTER — Inpatient Hospital Stay (HOSPITAL_COMMUNITY): Payer: Medicaid Other

## 2013-08-27 DIAGNOSIS — F1920 Other psychoactive substance dependence, uncomplicated: Secondary | ICD-10-CM

## 2013-08-27 DIAGNOSIS — I2510 Atherosclerotic heart disease of native coronary artery without angina pectoris: Secondary | ICD-10-CM

## 2013-08-27 DIAGNOSIS — E871 Hypo-osmolality and hyponatremia: Secondary | ICD-10-CM

## 2013-08-27 LAB — URINE CULTURE

## 2013-08-27 LAB — BASIC METABOLIC PANEL
BUN: 6 mg/dL (ref 6–23)
CO2: 31 mEq/L (ref 19–32)
Calcium: 9.2 mg/dL (ref 8.4–10.5)
Chloride: 89 mEq/L — ABNORMAL LOW (ref 96–112)
GFR calc Af Amer: >90 mL/min (ref >90)
Glucose, Bld: 119 mg/dL — ABNORMAL HIGH (ref 70–99)
Potassium: 4 mEq/L (ref 3.5–5.1)
Sodium: 129 mEq/L — ABNORMAL LOW (ref 135–145)

## 2013-08-27 LAB — THC (MARIJUANA), URINE, CONFIRMATION: Marijuana, Ur-Confirmation: 60 ng/mL

## 2013-08-27 MED ORDER — ASPIRIN 81 MG PO TBEC: 81.0000 mg | DELAYED_RELEASE_TABLET | Freq: Every day | ORAL | Status: DC

## 2013-08-27 MED ORDER — PANTOPRAZOLE SODIUM 40 MG PO TBEC
40.0000 mg | DELAYED_RELEASE_TABLET | Freq: Every day | ORAL | Status: DC
  Administered 2013-08-27: 40 mg via ORAL
  Filled 2013-08-27: qty 1

## 2013-08-27 MED ORDER — ATORVASTATIN CALCIUM 20 MG PO TABS: 20.0000 mg | ORAL_TABLET | Freq: Every evening | ORAL | Status: DC

## 2013-08-27 MED ORDER — ADULT MULTIVITAMIN W/MINERALS CH: 1.0000 | ORAL_TABLET | Freq: Every day | ORAL | Status: DC

## 2013-08-27 MED ORDER — NITROGLYCERIN 0.4 MG SL SUBL: 0.4000 mg | SUBLINGUAL_TABLET | SUBLINGUAL | Status: DC | PRN

## 2013-08-27 MED ORDER — OMEPRAZOLE 20 MG PO CPDR: 20.0000 mg | DELAYED_RELEASE_CAPSULE | Freq: Every day | ORAL | Status: DC

## 2013-08-27 MED ORDER — FOLIC ACID 1 MG PO TABS: 1.0000 mg | ORAL_TABLET | Freq: Every day | ORAL | Status: DC

## 2013-08-27 NOTE — Discharge Summary (Signed)
Discharge Summary   Patient ID: Zachary Walls MRN: [id number], DOB/AGE: [birth year] 57 y.o. Admit date: 08/25/2013 D/C date:     08/27/2013  Primary Cardiologist: New - seen by Gaye Pollack  Primary Discharge Diagnoses:  1. Chest pain, noncardiac, possible GERD 2. 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 Hyponatremia felt due to polydipsia 4. Polysubstance abuse with alcohol abuse, tobacco abuse, THC 5. H/o HTN, normotensive this adm off meds 6. COPD  Secondary Discharge Diagnoses:  1. History of pneumonia  2. HOH (hard of hearing)   3. Protein calorie malnutrition  4. Diverticulitis s/p colon resection and colostomy  5. Cardiac arrest 01/2013, felt to be 2/2 severe COPD req trach; 01/2013 Echo: EF 65-70% w/o rwma.   Hospital Course: Mr. Busk is a 57 y/o M with history of COPD, cardiac arrest in 01/2013 secondary to severe COPD, polysubstance abuse with EtOH/tobacco/THC who presented to [facility] with chest pain on 08/25/13 and ECG concerning for interior STEMI. He presented with intermittent chest discomfort that feels like heartburn and improved when he belches without any associated symptoms. On day of admission he had difficulty with walking with multiple falls. EMS was called. EtOH level was elevated. 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 He had hyperdynamic LV systolic function EF 70% with normal left ventricular end-diastolic pressure. There was no culprit idenitifed for his potential STEMI. He was placed on PPI given his EtOH in case this was GERD or gastric inflammation related. He was not hypoxic. Of note sodium level was 124 on admission. He had mild confusion associated with this. The patient admitted to polydipsia at home. Fluids were restricted and sodium improved to 129, as did his mental status which was felt attributed to the  hyponatremia. CXR was without acute process. On day of discharge he is feeling well. He remained normotensive off any antihypertensives this admission (had previously run out of lisinopril/HCTZ during admission, and pressure generally ran upper 90s-120s thus did not re-prescribe at discharge.)  Dr. Shirlee Latch has seen and examined the patient today and feels he is stable for discharge.   Would consider outpatient f/u labs in 6-8 weeks given statin initiation. He was counseled on polysubstance cessation. He will f/u in our office in about 2 weeks. He was also instructed to f/u PCP as well (in the system). He was counseled on cutting down the amount of fluids he drinks and we suggested he try to chew gum instead.   Discharge Vitals: Blood pressure 118/58, pulse 69, temperature 97.7 F (36.5 C), temperature source Oral, resp. rate 18, height 5\' 6"  (1.676 m), weight 108 lb 7.5 oz (49.2 kg), SpO2 97.00%.  Labs: Lab Results  Component Value Date   WBC 6.6 08/26/2013   HGB 14.5 08/26/2013   HCT 39.8 08/26/2013   MCV 88.2 08/26/2013   PLT 302 08/26/2013    Recent Labs Lab 08/25/13 1700  08/27/13 0800  NA 125*  < > 129*  K 3.6  < > 4.0  CL 86*  < > 89*  CO2 21  < > 31  BUN 7  < > 6  CREATININE 0.96  < > 0.77  CALCIUM 8.6  < > 9.2  PROT 6.7  --   --   BILITOT 0.6  --   --   ALKPHOS 62  --   --   ALT  21  --   --   AST 41*  --   --   GLUCOSE 71  < > 119*  < > = values in this interval not displayed.  Recent Labs  08/25/13 1700 08/25/13 2035 08/26/13 0159 08/26/13 0843  CKTOTAL 103  --   --   --   CKMB 2.9  --   --   --   TROPONINI <0.30 <0.30 <0.30 <0.30   Lab Results  Component Value Date   CHOL 169 08/26/2013   HDL 94 08/26/2013   LDLCALC 59 08/26/2013   TRIG 81 08/26/2013    Diagnostic Studies/Procedures   Cardiac catheterization this admission, please see full report and above for summary.   Dg Chest 2 View  08/27/2013   CLINICAL DATA:  Hyponatremia. COPD. Chest pain and  weakness.  EXAM: CHEST  2 VIEW  COMPARISON:  07/2013  FINDINGS: The heart size and mediastinal contours are within normal limits. Coarse reticular opacities are again noted centrally consistent with bronchitic change/scarring. This is stable. The lungs are mildly hyperexpanded but otherwise clear. No pleural effusion or pneumothorax. The bony thorax is intact.  IMPRESSION: No active cardiopulmonary disease.   Electronically Signed   By: Amie Portland M.D.   On: 08/27/2013 08:35   Discharge Medications     Medication List    STOP taking these medications       lisinopril-hydrochlorothiazide 20-25 MG per tablet  Commonly known as:  PRINZIDE,ZESTORETIC     losartan 50 MG tablet  Commonly known as:  COZAAR      TAKE these medications       albuterol (2.5 MG/3ML) 0.083% nebulizer solution  Commonly known as:  PROVENTIL  Take 2.5 mg by nebulization 3 (three) times daily.     aspirin 81 MG EC tablet  Take 1 tablet (81 mg total) by mouth daily.     atorvastatin 20 MG tablet  Commonly known as:  LIPITOR  Take 1 tablet (20 mg total) by mouth every evening.     clotrimazole 1 % cream  Commonly known as:  LOTRIMIN  Apply to affected area 2 times daily     escitalopram 10 MG tablet  Commonly known as:  LEXAPRO  Take 1 tablet (10 mg total) by mouth daily.     folic acid 1 MG tablet  Commonly known as:  FOLVITE  Take 1 tablet (1 mg total) by mouth daily.     HYDROcodone-acetaminophen 5-325 MG per tablet  Commonly known as:  NORCO/VICODIN  Take 1 tablet by mouth every 6 (six) hours as needed for moderate pain or severe pain.     Ipratropium-Albuterol 20-100 MCG/ACT Aers respimat  Commonly known as:  COMBIVENT RESPIMAT  Inhale 2 puffs into the lungs 4 (four) times daily.     LORazepam 0.5 MG tablet  Commonly known as:  ATIVAN  Take 1 tablet (0.5 mg total) by mouth every 8 (eight) hours as needed for anxiety.     multivitamin with minerals Tabs tablet  Take 1 tablet by mouth  daily.     nitroGLYCERIN 0.4 MG SL tablet  Commonly known as:  NITROSTAT  Place 1 tablet (0.4 mg total) under the tongue every 5 (five) minutes as needed for chest pain (up to 3 doses).     omeprazole 20 MG capsule  Commonly known as:  PRILOSEC  Take 1 capsule (20 mg total) by mouth daily.     QUEtiapine 50 MG tablet  Commonly known as:  SEROQUEL  Take 1 tablet (50 mg total) by mouth every morning.     risperiDONE 0.5 MG tablet  Commonly known as:  RISPERDAL  Take 1 tablet (0.5 mg total) by mouth 2 (two) times daily.     tiotropium 18 MCG inhalation capsule  Commonly known as:  SPIRIVA HANDIHALER  Place 1 capsule (18 mcg total) into inhaler and inhale daily.        Disposition   The patient will be discharged in stable condition to home. Discharge Orders   Future Appointments Provider Department Dept Phone   09/09/2013 8:30 AM Rosalio Macadamia, NP [facility] [phone]   Future Orders Complete By Expires   Diet - low sodium heart healthy  As directed    Scheduling Instructions:     Please cut down on the amount of fluids you are drinking. Try chewing gum instead.   Increase activity slowly  As directed    Comments:     No driving for 2 days. Do NOT drive if you have been drinking ANY alcohol. No lifting over 5 lbs for 1 week. No sexual activity for 1 week. You may return to work in 1 week if you are feeling well as long as you have not been drinking. Keep procedure site clean & dry. If you notice increased pain, swelling, bleeding or pus, call/return!  You may shower, but no soaking baths/hot tubs/pools for 1 week.     Follow-up Information   Follow up with Siy-Hian, Whitman Hero, MD. (Follow up with your primary care doctor. Please discuss strategies to help you stop drinking.)    Specialty:  Internal Medicine   Contact information:   [address] [place] [phone]       Follow up with Norma Fredrickson, NP. [facility] - 09/09/13 at 8:30am)    Specialty:  Nurse Practitioner   Contact information:   [address] [place] [address] [phone]         Duration of Discharge Encounter: Greater than 30 minutes including physician and PA time.  Signed, Ronie Spies PA-C 08/27/2013, 9:29 AM

## 2013-08-27 NOTE — Progress Notes (Signed)
Patient: Zachary Walls / Admit Date: 08/25/2013 / Date of Encounter: 08/27/2013, 7:34 AM   Subjective  Denies CP. Chronic SOB is stable, feeling OK today.  Objective   Telemetry: NSR (sinus tach yesterday that resolved)  Physical Exam: Blood pressure 107/63, pulse 59, temperature 97.8 F (36.6 C), temperature source Oral, resp. rate 18, height 5\' 6"  (1.676 m), weight 108 lb 7.5 oz (49.2 kg), SpO2 97.00%. General: Well developed, well nourished thin WM in no acute distress. Head: Normocephalic, atraumatic, sclera non-icteric, no xanthomas, nares are without discharge. Neck: JVP not elevated. Lungs: Diminished BS througout, no wheezes, rales or rhonchi. Breathing is unlabored. Heart: RRR S1 S2 without murmurs, rubs, or gallops.  Abdomen: Soft, non-tender, non-distended with normoactive bowel sounds. No rebound/guarding. S/p colostomy Extremities: No clubbing or cyanosis. No edema. Distal pedal pulses are 2+ and equal bilaterally. R groin site without ecchymosis hematoma or bruit. Neuro: Alert and oriented X 3. Moves all extremities spontaneously. Extremely hard of hearing. Psych:  Responds to questions appropriately with a tangential affect.   Intake/Output Summary (Last 24 hours) at 08/27/13 0734 Last data filed at 08/26/13 1800  Gross per 24 hour  Intake    560 ml  Output    375 ml  Net    185 ml    Inpatient Medications:  . aspirin EC  81 mg Oral Daily  . atorvastatin  20 mg Oral q1800  . escitalopram  10 mg Oral Daily  . folic acid  1 mg Oral Daily  . heparin  5,000 Units Subcutaneous Q8H  . influenza vac split quadrivalent PF  0.5 mL Intramuscular Tomorrow-1000  . Ipratropium-Albuterol  2 puff Inhalation QID  . multivitamin with minerals  1 tablet Oral Daily  . sodium chloride  3 mL Intravenous Q12H  . thiamine  100 mg Oral Daily   Or  . thiamine  100 mg Intravenous Daily  . tiotropium  18 mcg Inhalation Daily   Infusions:    Labs:  Recent Labs  08/25/13 1700  08/25/13 1806 08/26/13 0159  NA 125* 124* 128*  K 3.6 3.6 3.5  CL 86* 90* 91*  CO2 21  --  27  GLUCOSE 71 71 78  BUN 7 6 8   CREATININE 0.96 1.30 0.97  CALCIUM 8.6  --  8.9    Recent Labs  08/25/13 1700  AST 41*  ALT 21  ALKPHOS 62  BILITOT 0.6  PROT 6.7  ALBUMIN 3.5    Recent Labs  08/25/13 1700 08/25/13 1806 08/26/13 0159  WBC 8.4  --  6.6  HGB 14.6 15.6 14.5  HCT 39.8 46.0 39.8  MCV 87.1  --  88.2  PLT 297  --  302    Recent Labs  08/25/13 1700 08/25/13 2035 08/26/13 0159 08/26/13 0843  CKTOTAL 103  --   --   --   CKMB 2.9  --   --   --   TROPONINI <0.30 <0.30 <0.30 <0.30   No components found with this basename: POCBNP,   Recent Labs  08/25/13 1700  HGBA1C 5.1     Radiology/Studies:  Dg Chest 2 View  08/10/2013   CLINICAL DATA:  Smoker, difficulty speaking  EXAM: CHEST  2 VIEW  COMPARISON:  03/25/2013  FINDINGS: Hyperinflation again noted. Stable central chronic mild bronchitic changes. No acute infiltrate the or pulmonary edema. Bony thorax is stable.  IMPRESSION: No active disease. Hyperinflation again noted. Stable central chronic mild bronchitic changes.   Electronically Signed  By: Natasha Mead M.D.   On: 08/10/2013 12:35   Ct Head Wo Contrast  08/10/2013   CLINICAL DATA:  Headache  EXAM: CT HEAD WITHOUT CONTRAST  TECHNIQUE: Contiguous axial images were obtained from the base of the skull through the vertex without intravenous contrast.  COMPARISON:  02/09/2013; 02/05/2013  FINDINGS: The brainstem, cerebellum, cerebral peduncles, thalamus, basal ganglia, basilar cisterns, and ventricular system appear within normal limits. No intracranial hemorrhage, mass lesion, or acute CVA. Mild bifrontal atrophy. Small left mastoid effusion.  IMPRESSION: 1. No acute intracranial findings. 2. Mild bifrontal atrophy 3. Small left mastoid effusion.   Electronically Signed   By: Herbie Baltimore M.D.   On: 08/10/2013 13:25     Assessment and Plan  1. Chest pain,  noncardiac - initial concern for inferior STEMI but troponins negative and cath showing nonobstructive CAD. Continue low dose ASA, statin. 2. Hyponatremia - AMS felt related to this on adm. His hyponatremia was felt possibly due to polydipsia. Will order CXR to r/o lung process given h/o lung dz. Order BMET for this morning. Will defer to MD regarding further eval.  3. COPD - cont home regimen. Not hypoxic. 4. Polysubstance with THC/EtOH abuse - EtOH elevated on adm. Counseled regarding cessation.  5. HTN - he is normotensive off meds.   Signed, Zachary Spies PA-C  Patient seen with PA.  Na up to 129 today.  Needs to cut back on fluid intake (polydipsia).  Advised him to try chewing gum.  Can go home today, does not need cardiology followup.  Make sure he has a PCP.   Zachary Walls 08/27/2013

## 2013-08-28 ENCOUNTER — Encounter: Payer: Self-pay | Admitting: Physician Assistant

## 2013-08-28 NOTE — Progress Notes (Signed)
I received a call from [place], RN on 2w. I discharged this patient yesterday. The patient contacted the unit today because he went to his pharmacy today expecting a prescription for Xanax and Vicodin. I did not prescribe these nor did I intend to. He was admitted for noncardiac CP. We just elected to continue his Vicodin on his home med list as previously listed but did not write a new rx. Xanax was not listed on his home med list. I told Nego to please let the patient know if he needs refills on those medicines that cardiology does not prescribe them and he should contact his regular doctor who prescribes them for refill. They will pass this info onto the patient. Dayna Dunn PA-C

## 2013-09-09 ENCOUNTER — Encounter: Payer: Medicaid Other | Admitting: Nurse Practitioner

## 2013-10-16 ENCOUNTER — Encounter: Payer: Self-pay | Admitting: Nurse Practitioner

## 2013-11-25 ENCOUNTER — Inpatient Hospital Stay (HOSPITAL_COMMUNITY)
Admission: EM | Admit: 2013-11-25 | Discharge: 2013-11-26 | DRG: 640 | Disposition: A | Discharge status: Home / Self Care | Payer: Medicaid Other | Attending: Internal Medicine | Admitting: Internal Medicine

## 2013-11-25 DIAGNOSIS — I2510 Atherosclerotic heart disease of native coronary artery without angina pectoris: Secondary | ICD-10-CM | POA: Diagnosis present

## 2013-11-25 DIAGNOSIS — F1010 Alcohol abuse, uncomplicated: Secondary | ICD-10-CM

## 2013-11-25 DIAGNOSIS — R531 Weakness: Secondary | ICD-10-CM

## 2013-11-25 DIAGNOSIS — F39 Unspecified mood [affective] disorder: Secondary | ICD-10-CM | POA: Diagnosis present

## 2013-11-25 DIAGNOSIS — E785 Hyperlipidemia, unspecified: Secondary | ICD-10-CM | POA: Diagnosis present

## 2013-11-25 DIAGNOSIS — F1910 Other psychoactive substance abuse, uncomplicated: Secondary | ICD-10-CM | POA: Diagnosis present

## 2013-11-25 DIAGNOSIS — E43 Unspecified severe protein-calorie malnutrition: Secondary | ICD-10-CM | POA: Diagnosis present

## 2013-11-25 DIAGNOSIS — R634 Abnormal weight loss: Secondary | ICD-10-CM

## 2013-11-25 DIAGNOSIS — Z7982 Long term (current) use of aspirin: Secondary | ICD-10-CM

## 2013-11-25 DIAGNOSIS — Z91038 Other insect allergy status: Secondary | ICD-10-CM

## 2013-11-25 DIAGNOSIS — H9190 Unspecified hearing loss, unspecified ear: Secondary | ICD-10-CM | POA: Diagnosis present

## 2013-11-25 DIAGNOSIS — R079 Chest pain, unspecified: Secondary | ICD-10-CM

## 2013-11-25 DIAGNOSIS — I10 Essential (primary) hypertension: Secondary | ICD-10-CM | POA: Diagnosis present

## 2013-11-25 DIAGNOSIS — F17200 Nicotine dependence, unspecified, uncomplicated: Secondary | ICD-10-CM | POA: Diagnosis present

## 2013-11-25 DIAGNOSIS — Z885 Allergy status to narcotic agent status: Secondary | ICD-10-CM

## 2013-11-25 DIAGNOSIS — E162 Hypoglycemia, unspecified: Principal | ICD-10-CM | POA: Diagnosis present

## 2013-11-25 DIAGNOSIS — J449 Chronic obstructive pulmonary disease, unspecified: Secondary | ICD-10-CM | POA: Diagnosis present

## 2013-11-25 DIAGNOSIS — R4182 Altered mental status, unspecified: Secondary | ICD-10-CM

## 2013-11-25 DIAGNOSIS — Z8249 Family history of ischemic heart disease and other diseases of the circulatory system: Secondary | ICD-10-CM

## 2013-11-25 DIAGNOSIS — Z79899 Other long term (current) drug therapy: Secondary | ICD-10-CM

## 2013-11-25 DIAGNOSIS — Z91013 Allergy to seafood: Secondary | ICD-10-CM

## 2013-11-25 DIAGNOSIS — F1920 Other psychoactive substance dependence, uncomplicated: Secondary | ICD-10-CM

## 2013-11-25 DIAGNOSIS — J4489 Other specified chronic obstructive pulmonary disease: Secondary | ICD-10-CM | POA: Diagnosis present

## 2013-11-25 DIAGNOSIS — Z933 Colostomy status: Secondary | ICD-10-CM

## 2013-11-25 DIAGNOSIS — Z681 Body mass index (BMI) 19 or less, adult: Secondary | ICD-10-CM

## 2013-11-25 HISTORY — DX: Anxiety disorder, unspecified: F41.9

## 2013-11-25 HISTORY — DX: Shortness of breath: R06.02

## 2013-11-25 HISTORY — DX: Peripheral vascular disease, unspecified: I73.9

## 2013-11-25 LAB — GLUCOSE, CAPILLARY: Glucose-Capillary: 140 mg/dL — ABNORMAL HIGH (ref 70–99)

## 2013-11-25 NOTE — ED Notes (Addendum)
Pt sts that he drank a quart of beer today. Pt has been drinking heavily his entire life. Pt reports he has been forgeting what he is going to do. sts he was going to go to liquor store but went out and forgot he wanted to go for example. Pt sts he will be walking then next thing he knows he will wake up on the floor. sts these episodes have probably been going on for a couple weeks. Pt has been out of medications for at least 2 weeks as well.

## 2013-11-25 NOTE — ED Provider Notes (Addendum)
CSN: [id number]     Arrival date & time 11/25/13  2126 History   First MD Initiated Contact with Patient 11/25/13 2308     Chief Complaint  Patient presents with  . Hypoglycemia   (Consider location/radiation/quality/duration/timing/severity/associated sxs/prior Treatment) Patient is a 58 y.o. male presenting with hypoglycemia. The history is provided by the patient and the EMS personnel.  Hypoglycemia Associated symptoms: no shortness of breath, no speech difficulty and no vomiting   pt with hx etoh abuse, states drinks quart of beer/liquor everyday, states in past few weeks has felt generally weak. States poor po intake/poor diet.  Unspecified wt loss. States feels forgetful at times. Intermittent frontal headaches. Pt very difficult historian, describing multiple unrelated symptoms. States also achy all over for several weeks. Indicates at times will feel lightheaded/faint. Also states out of his meds for 2 weeks. ems was called this evening due to altered mental status, and found pt w low blood sugar of 42, gave d50, cbg 157.  Pt etoh abuse, denies hx seizures, dts, or complicated etoh withdrawal. States not interested in detox/rehab, states he only drinks as much as he always has. Has ostomy, is functioning normally, light brown stool - pt states unsure why he has ostomy.  Denies fever or chills.      Past Medical History  Diagnosis Date  . COPD (chronic obstructive pulmonary disease)   . History of pneumonia   . Hypertension     a. Normotensive 08/2013 off meds.  [name] HOH (hard of hearing)   . Protein calorie malnutrition   . ETOH abuse   . Tobacco abuse   . Diverticulitis     a. s/p colon resection and colostomy  . Cardiac arrest     a. 01/2013, felt to be 2/2 severe COPD req trach;  b. 01/2013 Echo: EF 65-70% w/o rwma.  . Difficult intubation   . CAD (coronary artery disease)     a. Nonobstructive CAD by cath 08/2013 (mod RCA, mid LAD myocardial bridge) - ECG was concerning for inf  STEMI but pt ruled out. CP possibly GERD.   [name] Hyponatremia     a. During 08/2013 felt due to polydipsia.  . Polysubstance abuse     a. Tobacco, marijuana, alcoholism.   Past Surgical History  Procedure Laterality Date  . Hernia repair      inguinal  . Colostomy  12/11/2011    Procedure: COLOSTOMY;  Surgeon: Rolm Bookbinder, MD;  Location: [facility];  Service: General;  Laterality: N/A;  . Colostomy revision  12/11/2011    Procedure: COLON RESECTION SIGMOID;  Surgeon: Rolm Bookbinder, MD;  Location: [facility];  Service: General;;  . Bowel resection  12/11/2011    Procedure: SMALL BOWEL RESECTION;  Surgeon: Rolm Bookbinder, MD;  Location: [facility];  Service: General;;  times 2  . Cystoscopy w/ ureteral stent placement  12/11/2011    Procedure: CYSTOSCOPY WITH STENT REPLACEMENT;  Surgeon: Malka So, MD;  Location: [facility];  Service: Urology;  Laterality: Left;  ureteral catheter placement  . Cardiac catheterization  08/25/2013  . Tracheostomy  01/2013    emergent d/t difficult intubation  . Colon surgery     Family History  Problem Relation Age of Onset  . Heart attack Father   . Cancer Father     unknown type  . HIV Brother    History  Substance Use Topics  . Smoking status: Current Every Day Smoker -- 0.50 packs/day for 44 years    Types:  Cigarettes    Last Attempt to Quit: 11/03/2011  . Smokeless tobacco: Never Used     Comment: Has smoked as much as 2-3 packs/day.  Now can make a pack last a week.  . Alcohol Use: Yes     Comment: Previously drank heavily.  Admits to drinking 1-3 40 oz beers most days of the week.    Review of Systems  Constitutional: Positive for unexpected weight change. Negative for fever and chills.  HENT: Negative for sore throat and trouble swallowing.   Eyes: Negative for redness and visual disturbance.  Respiratory: Negative for cough and shortness of breath.   Cardiovascular: Negative for chest pain, palpitations and leg swelling.  Gastrointestinal:  Negative for nausea, vomiting, abdominal pain, diarrhea and constipation.  Genitourinary: Negative for dysuria and flank pain.  Musculoskeletal: Negative for back pain and neck pain.  Skin: Negative for rash.  Neurological: Negative for speech difficulty and numbness.  Hematological: Does not bruise/bleed easily.  Psychiatric/Behavioral: Negative for dysphoric mood.    Allergies  Bee venom; Shellfish allergy; and Codeine  Home Medications   Current Outpatient Rx  Name  Route  Sig  Dispense  Refill  . albuterol (PROVENTIL) (2.5 MG/3ML) 0.083% nebulizer solution   Nebulization   Take 2.5 mg by nebulization 3 (three) times daily.         [name] aspirin EC 81 MG EC tablet   Oral   Take 1 tablet (81 mg total) by mouth daily.         [name] atorvastatin (LIPITOR) 20 MG tablet   Oral   Take 1 tablet (20 mg total) by mouth every evening.   30 tablet   3   . clotrimazole (LOTRIMIN) 1 % cream      Apply to affected area 2 times daily   15 g   0   . escitalopram (LEXAPRO) 10 MG tablet   Oral   Take 1 tablet (10 mg total) by mouth daily.   30 tablet   1   . folic acid (FOLVITE) 1 MG tablet   Oral   Take 1 tablet (1 mg total) by mouth daily.   30 tablet   3   . HYDROcodone-acetaminophen (NORCO/VICODIN) 5-325 MG per tablet   Oral   Take 1 tablet by mouth every 6 (six) hours as needed for moderate pain or severe pain.         . Ipratropium-Albuterol (COMBIVENT RESPIMAT) 20-100 MCG/ACT AERS respimat   Inhalation   Inhale 2 puffs into the lungs 4 (four) times daily.   1 Inhaler   1   . LORazepam (ATIVAN) 0.5 MG tablet   Oral   Take 1 tablet (0.5 mg total) by mouth every 8 (eight) hours as needed for anxiety.   30 tablet   0   . Multiple Vitamin (MULTIVITAMIN WITH MINERALS) TABS tablet   Oral   Take 1 tablet by mouth daily.   30 tablet   3   . nitroGLYCERIN (NITROSTAT) 0.4 MG SL tablet   Sublingual   Place 1 tablet (0.4 mg total) under the tongue every 5 (five)  minutes as needed for chest pain (up to 3 doses).   25 tablet   2   . omeprazole (PRILOSEC) 20 MG capsule   Oral   Take 1 capsule (20 mg total) by mouth daily.   30 capsule   2   . QUEtiapine (SEROQUEL) 50 MG tablet   Oral   Take 1 tablet (50 mg total)  by mouth every morning.   30 tablet   1   . risperiDONE (RISPERDAL) 0.5 MG tablet   Oral   Take 1 tablet (0.5 mg total) by mouth 2 (two) times daily.   30 tablet   1   . tiotropium (SPIRIVA HANDIHALER) 18 MCG inhalation capsule   Inhalation   Place 1 capsule (18 mcg total) into inhaler and inhale daily.   30 capsule   1    BP 127/74  Pulse 83  Temp(Src) 98.8 F (37.1 C) (Oral)  Resp 10  SpO2 95% Physical Exam  Nursing note and vitals reviewed. Constitutional: He is oriented to person, place, and time. He appears well-developed and well-nourished. No distress.  Very weak/frail appearing.   HENT:  Head: Atraumatic.  Mouth/Throat: Oropharynx is clear and moist.  Eyes: Conjunctivae are normal. Pupils are equal, round, and reactive to light. No scleral icterus.  Neck: Neck supple. No tracheal deviation present. No thyromegaly present.  No stiffness/rigidity  Cardiovascular: Normal rate, regular rhythm, normal heart sounds and intact distal pulses.   Pulmonary/Chest: Effort normal. No accessory muscle usage. No respiratory distress.  Abdominal: Soft. Bowel sounds are normal. He exhibits no distension and no mass. There is no tenderness. There is no rebound and no guarding.  Ostomy pink, patent, functioning, w light brown stool in bag.  Genitourinary:  No cva tenderness  Musculoskeletal: Normal range of motion. He exhibits no edema and no tenderness.  Neurological: He is alert and oriented to person, place, and time. No cranial nerve deficit.  Motor intact bil.   Skin: Skin is warm and dry. No rash noted. He is not diaphoretic.  Psychiatric: He has a normal mood and affect.    ED Course  Procedures (including  critical care time)   Results for orders placed during the hospital encounter of 11/25/13  GLUCOSE, CAPILLARY      Result Value Range   Glucose-Capillary 140 (*) 70 - 99 mg/dL  CBC      Result Value Range   WBC 8.0  4.0 - 10.5 K/uL   RBC 4.44  4.22 - 5.81 MIL/uL   Hemoglobin 14.2  13.0 - 17.0 g/dL   HCT 39.9  39.0 - 52.0 %   MCV 89.9  78.0 - 100.0 fL   MCH 32.0  26.0 - 34.0 pg   MCHC 35.6  30.0 - 36.0 g/dL   RDW 14.5  11.5 - 15.5 %   Platelets 155  150 - 400 K/uL  COMPREHENSIVE METABOLIC PANEL      Result Value Range   Sodium 137  137 - 147 mEq/L   Potassium 4.2  3.7 - 5.3 mEq/L   Chloride 94 (*) 96 - 112 mEq/L   CO2 24  19 - 32 mEq/L   Glucose, Bld 101 (*) 70 - 99 mg/dL   BUN 9  6 - 23 mg/dL   Creatinine, Ser 0.83  0.50 - 1.35 mg/dL   Calcium 9.0  8.4 - 10.5 mg/dL   Total Protein 7.0  6.0 - 8.3 g/dL   Albumin 3.4 (*) 3.5 - 5.2 g/dL   AST 121 (*) 0 - 37 U/L   ALT 69 (*) 0 - 53 U/L   Alkaline Phosphatase 73  39 - 117 U/L   Total Bilirubin 1.1  0.3 - 1.2 mg/dL   GFR calc non Af Amer >90  >90 mL/min   GFR calc Af Amer >90  >90 mL/min  URINE RAPID DRUG SCREEN (HOSP PERFORMED)  Result Value Range   Opiates NONE DETECTED  NONE DETECTED   Cocaine NONE DETECTED  NONE DETECTED   Benzodiazepines NONE DETECTED  NONE DETECTED   Amphetamines NONE DETECTED  NONE DETECTED   Tetrahydrocannabinol POSITIVE (*) NONE DETECTED   Barbiturates NONE DETECTED  NONE DETECTED  ETHANOL      Result Value Range   Alcohol, Ethyl (B) 15 (*) 0 - 11 mg/dL  URINALYSIS, ROUTINE W REFLEX MICROSCOPIC      Result Value Range   Color, Urine YELLOW  YELLOW   APPearance CLOUDY (*) CLEAR   Specific Gravity, Urine 1.018  1.005 - 1.030   pH 5.5  5.0 - 8.0   Glucose, UA 250 (*) NEGATIVE mg/dL   Hgb urine dipstick TRACE (*) NEGATIVE   Bilirubin Urine NEGATIVE  NEGATIVE   Ketones, ur 40 (*) NEGATIVE mg/dL   Protein, ur 30 (*) NEGATIVE mg/dL   Urobilinogen, UA 1.0  0.0 - 1.0 mg/dL   Nitrite NEGATIVE   NEGATIVE   Leukocytes, UA NEGATIVE  NEGATIVE  TROPONIN I      Result Value Range   Troponin I <0.30  <0.30 ng/mL  URINE MICROSCOPIC-ADD ON      Result Value Range   Squamous Epithelial / LPF RARE  RARE   RBC / HPF 0-2  <3 RBC/hpf   Bacteria, UA RARE  RARE   Casts GRANULAR CAST (*) NEGATIVE  GLUCOSE, CAPILLARY      Result Value Range   Glucose-Capillary 100 (*) 70 - 99 mg/dL   Dg Chest 2 View  11/26/2013   CLINICAL DATA:  Headache.  EXAM: CHEST  2 VIEW  COMPARISON:  CT chest 05/15/2011 and PA and lateral chest 08/27/2013.  FINDINGS: Lungs are markedly emphysematous but clear. Heart size is normal. No pneumothorax or pleural effusion.  IMPRESSION: Severe emphysema without acute disease.   Electronically Signed   By: Inge Rise M.D.   On: 11/26/2013 00:52   Ct Head Wo Contrast  11/26/2013   CLINICAL DATA:  Altered vision.  EXAM: CT HEAD WITHOUT CONTRAST  TECHNIQUE: Contiguous axial images were obtained from the base of the skull through the vertex without intravenous contrast.  COMPARISON:  Head CT scan 08/10/2013.  Brain MRI 02/09/2013.  FINDINGS: Again seen is mild cortical atrophy. There is no evidence of acute abnormality including infarct, hemorrhage, mass lesion, mass effect, midline shift or abnormal extra-axial fluid collection. There is no hydrocephalus or pneumocephalus. The calvarium is intact. Small amount of left mastoid fluid is noted.  IMPRESSION: No acute finding.  Stable compared to prior exam.   Electronically Signed   By: Inge Rise M.D.   On: 11/26/2013 01:02      EKG Interpretation    Date/Time:  Wednesday November 25 2013 21:48:30 EST Ventricular Rate:  82 PR Interval:  131 QRS Duration: 95 QT Interval:  392 QTC Calculation: 458 R Axis:   90 Text Interpretation:  Sinus rhythm Biatrial enlargement Borderline right axis deviation Nonspecific T abnrm, anterolateral leads Nonspecific ST abnormality No significant change since last tracing Confirmed by Kenitha Glendinning   MD, Shaylah Mcghie ([id number]) on 11/25/2013 11:57:24 PM            MDM  Iv ns. Labs. Ct. Cxr.  Reviewed nursing notes and prior charts for additional history.   Pt given meal tray, po fluids. Recheck bs.   Recheck blood sugar. Back down to 60.  D50. Placed on d5ns. Pt encouraged to eat/drink.  Given recurrent hypoglycemia in pt w  etoh abuse, weakness, poor po intake, wt loss, confusion, will admit.   Discussed w Dr Hal Hope - states temp orders, med surg.   Pt placed on ciwa etoh withdrawal prevention.        Mirna Mires, MD 11/26/13 [id number]

## 2013-11-25 NOTE — ED Notes (Addendum)
Per ems-- pt reporting that he is having episodes of spotting in vision and blackouts. Unable to say how often, how long or elaborate on anything. 12 lead unremarkable. ems administered half amp of D50 and insta-glucouse pta. Last cbg 157. Pt has colostomy. Pt states that he hasn't been eating much but has been drinking etoh.

## 2013-11-26 ENCOUNTER — Encounter (HOSPITAL_COMMUNITY): Payer: Self-pay | Admitting: Radiology

## 2013-11-26 ENCOUNTER — Emergency Department (HOSPITAL_COMMUNITY): Payer: Medicaid Other

## 2013-11-26 ENCOUNTER — Inpatient Hospital Stay (HOSPITAL_COMMUNITY): Payer: Medicaid Other

## 2013-11-26 DIAGNOSIS — E162 Hypoglycemia, unspecified: Principal | ICD-10-CM

## 2013-11-26 DIAGNOSIS — R079 Chest pain, unspecified: Secondary | ICD-10-CM

## 2013-11-26 DIAGNOSIS — F1010 Alcohol abuse, uncomplicated: Secondary | ICD-10-CM

## 2013-11-26 DIAGNOSIS — F1920 Other psychoactive substance dependence, uncomplicated: Secondary | ICD-10-CM

## 2013-11-26 DIAGNOSIS — J449 Chronic obstructive pulmonary disease, unspecified: Secondary | ICD-10-CM

## 2013-11-26 LAB — URINALYSIS, ROUTINE W REFLEX MICROSCOPIC
BILIRUBIN URINE: NEGATIVE
GLUCOSE, UA: 250 mg/dL — AB
Ketones, ur: 40 mg/dL — AB
Leukocytes, UA: NEGATIVE
Nitrite: NEGATIVE
PH: 5.5 (ref 5.0–8.0)
Protein, ur: 30 mg/dL — AB
SPECIFIC GRAVITY, URINE: 1.018 (ref 1.005–1.030)
UROBILINOGEN UA: 1 mg/dL (ref 0.0–1.0)

## 2013-11-26 LAB — CBC WITH DIFFERENTIAL/PLATELET
BASOS ABS: 0.1 10*3/uL (ref 0.0–0.1)
Basophils Relative: 1 % (ref 0–1)
Eosinophils Absolute: 0.1 10*3/uL (ref 0.0–0.7)
Eosinophils Relative: 1 % (ref 0–5)
HCT: 38.8 % — ABNORMAL LOW (ref 39.0–52.0)
Hemoglobin: 13.6 g/dL (ref 13.0–17.0)
Lymphocytes Relative: 29 % (ref 12–46)
Lymphs Abs: 2.1 10*3/uL (ref 0.7–4.0)
MCH: 32.2 pg (ref 26.0–34.0)
MCHC: 35.1 g/dL (ref 30.0–36.0)
MCV: 91.7 fL (ref 78.0–100.0)
Monocytes Absolute: 0.6 10*3/uL (ref 0.1–1.0)
Monocytes Relative: 8 % (ref 3–12)
NEUTROS PCT: 62 % (ref 43–77)
Neutro Abs: 4.4 10*3/uL (ref 1.7–7.7)
PLATELETS: 127 10*3/uL — AB (ref 150–400)
RBC: 4.23 MIL/uL (ref 4.22–5.81)
RDW: 14.7 % (ref 11.5–15.5)
WBC: 7.2 10*3/uL (ref 4.0–10.5)

## 2013-11-26 LAB — URINE MICROSCOPIC-ADD ON

## 2013-11-26 LAB — COMPREHENSIVE METABOLIC PANEL
ALBUMIN: 3.3 g/dL — AB (ref 3.5–5.2)
ALK PHOS: 65 U/L (ref 39–117)
ALT: 65 U/L — ABNORMAL HIGH (ref 0–53)
ALT: 69 U/L — ABNORMAL HIGH (ref 0–53)
AST: 112 U/L — ABNORMAL HIGH (ref 0–37)
AST: 121 U/L — ABNORMAL HIGH (ref 0–37)
Albumin: 3.4 g/dL — ABNORMAL LOW (ref 3.5–5.2)
Alkaline Phosphatase: 73 U/L (ref 39–117)
BILIRUBIN TOTAL: 1.1 mg/dL (ref 0.3–1.2)
BUN: 8 mg/dL (ref 6–23)
BUN: 9 mg/dL (ref 6–23)
CALCIUM: 9 mg/dL (ref 8.4–10.5)
CHLORIDE: 94 meq/L — AB (ref 96–112)
CHLORIDE: 97 meq/L (ref 96–112)
CO2: 24 meq/L (ref 19–32)
CO2: 26 mEq/L (ref 19–32)
CREATININE: 0.83 mg/dL (ref 0.50–1.35)
Calcium: 8.5 mg/dL (ref 8.4–10.5)
Creatinine, Ser: 0.84 mg/dL (ref 0.50–1.35)
GFR calc Af Amer: >90 mL/min (ref >90)
GFR calc Af Amer: >90 mL/min (ref >90)
Glucose, Bld: 100 mg/dL — ABNORMAL HIGH (ref 70–99)
Glucose, Bld: 101 mg/dL — ABNORMAL HIGH (ref 70–99)
POTASSIUM: 4.1 meq/L (ref 3.7–5.3)
Potassium: 4.2 mEq/L (ref 3.7–5.3)
Sodium: 137 mEq/L (ref 137–147)
Sodium: 137 mEq/L (ref 137–147)
Total Bilirubin: 1.6 mg/dL — ABNORMAL HIGH (ref 0.3–1.2)
Total Protein: 6.5 g/dL (ref 6.0–8.3)
Total Protein: 7 g/dL (ref 6.0–8.3)

## 2013-11-26 LAB — RAPID URINE DRUG SCREEN, HOSP PERFORMED
Amphetamines: NOT DETECTED
BENZODIAZEPINES: NOT DETECTED
Barbiturates: NOT DETECTED
COCAINE: NOT DETECTED
OPIATES: NOT DETECTED
Tetrahydrocannabinol: POSITIVE — AB

## 2013-11-26 LAB — TROPONIN I
Troponin I: <0.3 ng/mL (ref <0.30)
Troponin I: <0.3 ng/mL (ref <0.30)

## 2013-11-26 LAB — CBC
HEMATOCRIT: 39.9 % (ref 39.0–52.0)
Hemoglobin: 14.2 g/dL (ref 13.0–17.0)
MCH: 32 pg (ref 26.0–34.0)
MCHC: 35.6 g/dL (ref 30.0–36.0)
MCV: 89.9 fL (ref 78.0–100.0)
PLATELETS: 155 10*3/uL (ref 150–400)
RBC: 4.44 MIL/uL (ref 4.22–5.81)
RDW: 14.5 % (ref 11.5–15.5)
WBC: 8 10*3/uL (ref 4.0–10.5)

## 2013-11-26 LAB — ETHANOL: Alcohol, Ethyl (B): 15 mg/dL — ABNORMAL HIGH (ref 0–11)

## 2013-11-26 LAB — AMMONIA: AMMONIA: 24 umol/L (ref 11–60)

## 2013-11-26 LAB — GLUCOSE, CAPILLARY
GLUCOSE-CAPILLARY: 131 mg/dL — AB (ref 70–99)
Glucose-Capillary: 100 mg/dL — ABNORMAL HIGH (ref 70–99)
Glucose-Capillary: 63 mg/dL — ABNORMAL LOW (ref 70–99)

## 2013-11-26 LAB — TSH: TSH: 1.727 u[IU]/mL (ref 0.350–4.500)

## 2013-11-26 MED ORDER — CLOTRIMAZOLE 1 % EX CREA
TOPICAL_CREAM | Freq: Two times a day (BID) | CUTANEOUS | Status: DC
  Administered 2013-11-26: 13:00:00 via TOPICAL
  Filled 2013-11-26 (×2): qty 15

## 2013-11-26 MED ORDER — IPRATROPIUM BROMIDE 0.02 % IN SOLN
0.5000 mg | Freq: Four times a day (QID) | RESPIRATORY_TRACT | Status: DC
  Administered 2013-11-26: 0.5 mg via RESPIRATORY_TRACT
  Filled 2013-11-26 (×2): qty 2.5

## 2013-11-26 MED ORDER — THIAMINE HCL 100 MG/ML IJ SOLN
100.0000 mg | Freq: Every day | INTRAMUSCULAR | Status: DC
  Filled 2013-11-26: qty 1

## 2013-11-26 MED ORDER — QUETIAPINE FUMARATE 50 MG PO TABS
50.0000 mg | ORAL_TABLET | Freq: Every morning | ORAL | Status: DC
  Administered 2013-11-26: 50 mg via ORAL
  Filled 2013-11-26: qty 1

## 2013-11-26 MED ORDER — LORAZEPAM 2 MG/ML IJ SOLN: 0.0000 mg | Freq: Two times a day (BID) | INTRAMUSCULAR | Status: DC

## 2013-11-26 MED ORDER — ALBUTEROL SULFATE (2.5 MG/3ML) 0.083% IN NEBU: 5.0000 mg | INHALATION_SOLUTION | RESPIRATORY_TRACT | Status: DC | PRN

## 2013-11-26 MED ORDER — ONDANSETRON HCL 4 MG PO TABS: 4.0000 mg | ORAL_TABLET | Freq: Four times a day (QID) | ORAL | Status: DC | PRN

## 2013-11-26 MED ORDER — LORAZEPAM 2 MG/ML IJ SOLN: 0.0000 mg | Freq: Four times a day (QID) | INTRAMUSCULAR | Status: DC

## 2013-11-26 MED ORDER — VITAMIN B-1 100 MG PO TABS
100.0000 mg | ORAL_TABLET | Freq: Every day | ORAL | Status: DC
  Administered 2013-11-26: 100 mg via ORAL
  Filled 2013-11-26: qty 1

## 2013-11-26 MED ORDER — QUETIAPINE FUMARATE 50 MG PO TABS: 50.0000 mg | ORAL_TABLET | Freq: Every morning | ORAL | Status: DC

## 2013-11-26 MED ORDER — PANTOPRAZOLE SODIUM 40 MG PO TBEC
40.0000 mg | DELAYED_RELEASE_TABLET | Freq: Every day | ORAL | Status: DC
  Administered 2013-11-26: 40 mg via ORAL
  Filled 2013-11-26: qty 1

## 2013-11-26 MED ORDER — FOLIC ACID 1 MG PO TABS
1.0000 mg | ORAL_TABLET | Freq: Every day | ORAL | Status: DC
  Administered 2013-11-26: 1 mg via ORAL
  Filled 2013-11-26: qty 1

## 2013-11-26 MED ORDER — FOLIC ACID 1 MG PO TABS
1.0000 mg | ORAL_TABLET | Freq: Every day | ORAL | Status: DC
  Filled 2013-11-26: qty 1

## 2013-11-26 MED ORDER — ADULT MULTIVITAMIN W/MINERALS CH: 1.0000 | ORAL_TABLET | Freq: Every day | ORAL | Status: DC

## 2013-11-26 MED ORDER — ASPIRIN EC 325 MG PO TBEC
325.0000 mg | DELAYED_RELEASE_TABLET | Freq: Every day | ORAL | Status: DC
Start: 2013-11-26 — End: 2013-11-26
  Administered 2013-11-26: 325 mg via ORAL
  Filled 2013-11-26: qty 1

## 2013-11-26 MED ORDER — LORAZEPAM 1 MG PO TABS: 1.0000 mg | ORAL_TABLET | Freq: Four times a day (QID) | ORAL | Status: DC | PRN

## 2013-11-26 MED ORDER — DEXTROSE-NACL 5-0.9 % IV SOLN
INTRAVENOUS | Status: DC
  Administered 2013-11-26: 1000 mL via INTRAVENOUS

## 2013-11-26 MED ORDER — IPRATROPIUM-ALBUTEROL 0.5-2.5 (3) MG/3ML IN SOLN
3.0000 mL | Freq: Four times a day (QID) | RESPIRATORY_TRACT | Status: DC
  Filled 2013-11-26: qty 3

## 2013-11-26 MED ORDER — ALBUTEROL SULFATE (2.5 MG/3ML) 0.083% IN NEBU: 2.5000 mg | INHALATION_SOLUTION | RESPIRATORY_TRACT | Status: DC | PRN

## 2013-11-26 MED ORDER — THIAMINE HCL 100 MG/ML IJ SOLN: 100.0000 mg | Freq: Once | INTRAMUSCULAR | Status: DC

## 2013-11-26 MED ORDER — LORAZEPAM 1 MG PO TABS: 0.0000 mg | ORAL_TABLET | Freq: Two times a day (BID) | ORAL | Status: DC

## 2013-11-26 MED ORDER — ONDANSETRON HCL 4 MG/2ML IJ SOLN: 4.0000 mg | Freq: Four times a day (QID) | INTRAMUSCULAR | Status: DC | PRN

## 2013-11-26 MED ORDER — ESCITALOPRAM OXALATE 10 MG PO TABS: 10.0000 mg | ORAL_TABLET | Freq: Every day | ORAL | Status: AC

## 2013-11-26 MED ORDER — BUDESONIDE 0.25 MG/2ML IN SUSP
0.2500 mg | Freq: Two times a day (BID) | RESPIRATORY_TRACT | Status: DC
  Filled 2013-11-26 (×3): qty 2

## 2013-11-26 MED ORDER — ACETAMINOPHEN 650 MG RE SUPP: 650.0000 mg | Freq: Four times a day (QID) | RECTAL | Status: DC | PRN

## 2013-11-26 MED ORDER — LORAZEPAM 2 MG/ML IJ SOLN: 1.0000 mg | Freq: Four times a day (QID) | INTRAMUSCULAR | Status: DC | PRN

## 2013-11-26 MED ORDER — ATORVASTATIN CALCIUM 20 MG PO TABS
20.0000 mg | ORAL_TABLET | Freq: Every evening | ORAL | Status: DC
  Administered 2013-11-26: 20 mg via ORAL
  Filled 2013-11-26: qty 1

## 2013-11-26 MED ORDER — SODIUM CHLORIDE 0.9 % IJ SOLN: 3.0000 mL | Freq: Two times a day (BID) | INTRAMUSCULAR | Status: DC

## 2013-11-26 MED ORDER — ENOXAPARIN SODIUM 30 MG/0.3ML ~~LOC~~ SOLN
30.0000 mg | SUBCUTANEOUS | Status: DC
  Administered 2013-11-26: 30 mg via SUBCUTANEOUS
  Filled 2013-11-26: qty 0.3

## 2013-11-26 MED ORDER — NITROGLYCERIN 0.4 MG SL SUBL: 0.4000 mg | SUBLINGUAL_TABLET | SUBLINGUAL | Status: DC | PRN

## 2013-11-26 MED ORDER — FOLIC ACID 1 MG PO TABS: 1.0000 mg | ORAL_TABLET | Freq: Every day | ORAL | Status: DC

## 2013-11-26 MED ORDER — RISPERIDONE 0.5 MG PO TABS: 0.5000 mg | ORAL_TABLET | Freq: Two times a day (BID) | ORAL | Status: DC

## 2013-11-26 MED ORDER — OMEPRAZOLE 20 MG PO CPDR: 20.0000 mg | DELAYED_RELEASE_CAPSULE | Freq: Every day | ORAL | Status: DC

## 2013-11-26 MED ORDER — VITAMIN B-1 100 MG PO TABS
100.0000 mg | ORAL_TABLET | Freq: Every day | ORAL | Status: DC
  Filled 2013-11-26: qty 1

## 2013-11-26 MED ORDER — ACETAMINOPHEN 325 MG PO TABS: 650.0000 mg | ORAL_TABLET | Freq: Four times a day (QID) | ORAL | Status: DC | PRN

## 2013-11-26 MED ORDER — ENSURE COMPLETE PO LIQD
237.0000 mL | ORAL | Status: DC
  Administered 2013-11-26: 237 mL via ORAL

## 2013-11-26 MED ORDER — ESCITALOPRAM OXALATE 10 MG PO TABS
10.0000 mg | ORAL_TABLET | Freq: Every day | ORAL | Status: DC
  Administered 2013-11-26: 10 mg via ORAL
  Filled 2013-11-26: qty 1

## 2013-11-26 MED ORDER — ASPIRIN EC 81 MG PO TBEC: 81.0000 mg | DELAYED_RELEASE_TABLET | Freq: Every day | ORAL | Status: DC

## 2013-11-26 MED ORDER — ADULT MULTIVITAMIN W/MINERALS CH
1.0000 | ORAL_TABLET | Freq: Every day | ORAL | Status: DC
  Administered 2013-11-26: 1 via ORAL
  Filled 2013-11-26: qty 1

## 2013-11-26 MED ORDER — DEXTROSE-NACL 5-0.9 % IV SOLN: INTRAVENOUS | Status: DC

## 2013-11-26 MED ORDER — LORAZEPAM 1 MG PO TABS
0.0000 mg | ORAL_TABLET | Freq: Four times a day (QID) | ORAL | Status: DC
  Administered 2013-11-26: 2 mg via ORAL
  Filled 2013-11-26: qty 2

## 2013-11-26 MED ORDER — ATORVASTATIN CALCIUM 20 MG PO TABS: 20.0000 mg | ORAL_TABLET | Freq: Every evening | ORAL | Status: DC

## 2013-11-26 MED ORDER — ADULT MULTIVITAMIN W/MINERALS CH
1.0000 | ORAL_TABLET | Freq: Every day | ORAL | Status: DC
  Filled 2013-11-26: qty 1

## 2013-11-26 MED ORDER — ALBUTEROL SULFATE (2.5 MG/3ML) 0.083% IN NEBU
2.5000 mg | INHALATION_SOLUTION | Freq: Four times a day (QID) | RESPIRATORY_TRACT | Status: DC
Start: 2013-11-26 — End: 2013-11-26
  Administered 2013-11-26: 2.5 mg via RESPIRATORY_TRACT
  Filled 2013-11-26 (×2): qty 3

## 2013-11-26 MED ORDER — RISPERIDONE 0.5 MG PO TABS
0.5000 mg | ORAL_TABLET | Freq: Two times a day (BID) | ORAL | Status: DC
  Administered 2013-11-26: 0.5 mg via ORAL
  Filled 2013-11-26 (×2): qty 1

## 2013-11-26 MED ORDER — HYDROCODONE-ACETAMINOPHEN 5-325 MG PO TABS: 1.0000 | ORAL_TABLET | Freq: Four times a day (QID) | ORAL | Status: DC | PRN

## 2013-11-26 NOTE — Progress Notes (Addendum)
INITIAL NUTRITION ASSESSMENT  DOCUMENTATION CODES Per approved criteria  -Severe  malnutrition in the context of social or environmental circumstances -Underweight   INTERVENTION: Add Ensure Complete po daily, each supplement provides 350 kcal and 13 grams of protein. Monitor magnesium, potassium, and phosphorus daily for at least 3 days, MD to replete as needed, as pt is at risk for refeeding syndrome given ongoing severe malnutrition. RD to continue to follow nutrition care plan.  NUTRITION DIAGNOSIS: Inadequate oral intake related to GI distress as evidenced by dietary recall and ongoing weight loss.   Goal: Intake to meet >90% of estimated nutrition needs.  Monitor:  weight trends, lab trends, I/O's, PO intake, supplement tolerance  Reason for Assessment: Malnutrition Screening Tool  58 y.o. male  Admitting Dx: Hypoglycemia  ASSESSMENT: PMHx significant for ETOH abuse, COPD, HLD, CAD. Admitted with weakness and "blacking out" x 2 weeks; poor oral intake associated with n/v. Presented with hypoglycemia 2/2 poor oral intake and alcoholism. Work-up ongoing.  Currently ordered for a Heart Healthy diet. Did not eat breakfast. States that prior to onset of symptoms he was eating well, consuming 3-4 meals a day. During the past 2 weeks he has had little to no food. We did not discuss his ETOH intake. Pt confirms ongoing severe weight loss. Pt shocked when he learned he is <100 lb, currently at 97 lb. States that he is hungry and ready to eat.   Nutrition Focused Physical Exam:  Subcutaneous Fat:  Orbital Region: moderate depletion Upper Arm Region: moderate depletion Thoracic and Lumbar Region: severe depletion  Muscle:  Temple Region: moderate depletion Clavicle Bone Region: severe depletion Clavicle and Acromion Bone Region: severe depletion Scapular Bone Region: n/a Dorsal Hand: n/a Patellar Region: n/a Anterior Thigh Region: n/a Posterior Calf Region: n/a  Edema:  none  Pt meets criteria for severe MALNUTRITION in the context of social/environmental circumstances as evidenced by 10% wt loss x 3 months, intake of <75% x at least 1 month, and severe fat and muscle mass loss.  Pt is at refeeding syndrome risk - potassium currently WNL. No magnesium or phosphorus is available.  Height: Ht Readings from Last 1 Encounters:  11/26/13 5\' 7"  (1.702 m)    Weight: Wt Readings from Last 1 Encounters:  11/26/13 97 lb 10.6 oz (44.3 kg)    Ideal Body Weight: 148 lb  % Ideal Body Weight: 66%  Wt Readings from Last 10 Encounters:  11/26/13 97 lb 10.6 oz (44.3 kg)  08/27/13 108 lb 7.5 oz (49.2 kg)  08/27/13 108 lb 7.5 oz (49.2 kg)  03/25/13 107 lb 3.2 oz (48.626 kg)  02/23/13 107 lb 12.9 oz (48.9 kg)  01/15/13 115 lb (52.164 kg)  08/05/12 113 lb 12.8 oz (51.619 kg)  01/28/12 113 lb 6.4 oz (51.438 kg)  12/14/11 122 lb 5.7 oz (55.5 kg)  12/14/11 122 lb 5.7 oz (55.5 kg)    Usual Body Weight: 108 lb  % Usual Body Weight: 90%  BMI:  Body mass index is 15.29 kg/(m^2). Underweight  Estimated Nutritional Needs: Kcal: 1500 - 1700 Protein: 60 - 75 g daily Fluid: at least 1.5 liters daily  Skin: intact  Diet Order: Cardiac  EDUCATION NEEDS: -No education needs identified at this time  No intake or output data in the 24 hours ending 11/26/13 1128  Last BM: 2/5 - ostomy  Labs:   Recent Labs Lab 11/25/13 2350  NA 137  K 4.2  CL 94*  CO2 24  BUN 9  CREATININE 0.83  CALCIUM 9.0  GLUCOSE 101*    CBG (last 3)   Recent Labs  11/25/13 2246 11/26/13 0055 11/26/13 0357  GLUCAP 140* 100* 63*    Scheduled Meds: . albuterol  2.5 mg Nebulization Q6H  . aspirin EC  325 mg Oral Daily  . atorvastatin  20 mg Oral QPM  . budesonide (PULMICORT) nebulizer solution  0.25 mg Nebulization BID  . clotrimazole   Topical BID  . enoxaparin (LOVENOX) injection  30 mg Subcutaneous Q24H  . escitalopram  10 mg Oral Daily  . folic acid  1 mg Oral  Daily  . ipratropium  0.5 mg Nebulization Q6H  . LORazepam  0-4 mg Intravenous Q6H   Followed by  . [START ON 11/28/2013] LORazepam  0-4 mg Intravenous Q12H  . multivitamin with minerals  1 tablet Oral Daily  . pantoprazole  40 mg Oral Daily  . QUEtiapine  50 mg Oral q morning - 10a  . risperiDONE  0.5 mg Oral BID  . sodium chloride  3 mL Intravenous Q12H  . thiamine  100 mg Oral Daily   Or  . thiamine  100 mg Intravenous Daily    Continuous Infusions: . dextrose 5 % and 0.9% NaCl 50 mL/hr at 11/26/13 7616    Past Medical History  Diagnosis Date  . COPD (chronic obstructive pulmonary disease)   . History of pneumonia   . Hypertension     a. Normotensive 08/2013 off meds.  [name] HOH (hard of hearing)   . Protein calorie malnutrition   . ETOH abuse   . Tobacco abuse   . Diverticulitis     a. s/p colon resection and colostomy  . Cardiac arrest     a. 01/2013, felt to be 2/2 severe COPD req trach;  b. 01/2013 Echo: EF 65-70% w/o rwma.  . Difficult intubation   . CAD (coronary artery disease)     a. Nonobstructive CAD by cath 08/2013 (mod RCA, mid LAD myocardial bridge) - ECG was concerning for inf STEMI but pt ruled out. CP possibly GERD.   [name] Hyponatremia     a. During 08/2013 felt due to polydipsia.  . Polysubstance abuse     a. Tobacco, marijuana, alcoholism.  [name] Anxiety   . Shortness of breath   . Peripheral vascular disease     Past Surgical History  Procedure Laterality Date  . Hernia repair      inguinal  . Colostomy  12/11/2011    Procedure: COLOSTOMY;  Surgeon: Rolm Bookbinder, MD;  Location: [facility];  Service: General;  Laterality: N/A;  . Colostomy revision  12/11/2011    Procedure: COLON RESECTION SIGMOID;  Surgeon: Rolm Bookbinder, MD;  Location: [facility];  Service: General;;  . Bowel resection  12/11/2011    Procedure: SMALL BOWEL RESECTION;  Surgeon: Rolm Bookbinder, MD;  Location: [facility];  Service: General;;  times 2  . Cystoscopy w/ ureteral stent placement   12/11/2011    Procedure: CYSTOSCOPY WITH STENT REPLACEMENT;  Surgeon: Malka So, MD;  Location: [facility];  Service: Urology;  Laterality: Left;  ureteral catheter placement  . Cardiac catheterization  08/25/2013  . Tracheostomy  01/2013    emergent d/t difficult intubation  . Colon surgery    . Tracheostomy closure      Inda Coke MS, RD, LDN Pager: [phone] After-hours pager: [phone]

## 2013-11-26 NOTE — Discharge Summary (Signed)
Physician Discharge Summary  KEMPER HUMM [id number] DOB: Aug 12, 1956 DOA: 11/25/2013  PCP: Adonis Brook, MD  Admit date: 11/25/2013 Discharge date: 11/26/2013  Time spent: 25 minutes  Recommendations for Outpatient Follow-up:  1. Follow up with PCP in next few weeks.  Discharge Diagnoses:  Principal Problem:   Hypoglycemia:  Active Problems:   COPD (chronic obstructive pulmonary disease)   Hypertension   Severe protein-calorie malnutrition   Chest pain   Polysubstance (excluding opioids) dependence   Discharge Condition: Improved, being discharged to home  Diet recommendation: pt advised to stop drinking etoh  Filed Weights   11/26/13 0636  Weight: 44.3 kg (97 lb 10.6 oz)    History of present illness:  58 year old white male past medical history COPD, CAD and alcohol abuse presented to the emergency room by EMS after several episodes of blacking out over the past few weeks. He was noted to have a CBG of 40 on admission.  Hospital Course:  Principal Problem:   Hypoglycemia: Likely secondary to poor by mouth intake while taking alcohol. Patient put on D5 and CBGs result. Tolerating by mouth. Active Problems:   COPD (chronic obstructive pulmonary disease): Patient was noted to have some mild wheezing on admission. Started on nebulizers which he tolerated well.  Hypertension   Severe protein-calorie malnutrition: Evaluated by nutritionist. Patient meets criteria for severe malnutrition in setting of chronic illness. Started on boost supplement   Chest pain: Felt to be secondary to COPD in this case. Troponins normal. Monitored on telemetry.   Polysubstance (excluding opioids) dependence Alcohol abuse: Placed on Ativan withdrawal protocol. Negative LFTs likely from acute alcohol intake. Mood disorder: Patient given refills for his Seroquel and Risperdal Procedures:  None  Consultations:  None  Discharge Exam: Filed Vitals:   11/26/13 1500  BP: 124/79   Pulse: 84  Temp: 98.1 F (36.7 C)  Resp: 18    General: Alert and oriented x3, no acute distress Cardiovascular: Regular rate and rhythm, S1-S2 Respiratory: Clear to auscultation bilaterally  Discharge Instructions  Discharge Orders   Future Orders Complete By Expires   Diet - low sodium heart healthy  As directed    Increase activity slowly  As directed        Medication List         albuterol (2.5 MG/3ML) 0.083% nebulizer solution  Commonly known as:  PROVENTIL  Take 2.5 mg by nebulization 3 (three) times daily.     aspirin 81 MG EC tablet  Take 1 tablet (81 mg total) by mouth daily.     atorvastatin 20 MG tablet  Commonly known as:  LIPITOR  Take 1 tablet (20 mg total) by mouth every evening.     clotrimazole 1 % cream  Commonly known as:  LOTRIMIN  Apply to affected area 2 times daily     escitalopram 10 MG tablet  Commonly known as:  LEXAPRO  Take 1 tablet (10 mg total) by mouth daily.     folic acid 1 MG tablet  Commonly known as:  FOLVITE  Take 1 tablet (1 mg total) by mouth daily.     HYDROcodone-acetaminophen 5-325 MG per tablet  Commonly known as:  NORCO/VICODIN  Take 1 tablet by mouth every 6 (six) hours as needed for moderate pain or severe pain.     Ipratropium-Albuterol 20-100 MCG/ACT Aers respimat  Commonly known as:  COMBIVENT RESPIMAT  Inhale 2 puffs into the lungs 4 (four) times daily.     LORazepam 0.5 MG tablet  Commonly known as:  ATIVAN  Take 1 tablet (0.5 mg total) by mouth every 8 (eight) hours as needed for anxiety.     multivitamin with minerals Tabs tablet  Take 1 tablet by mouth daily.     nitroGLYCERIN 0.4 MG SL tablet  Commonly known as:  NITROSTAT  Place 1 tablet (0.4 mg total) under the tongue every 5 (five) minutes as needed for chest pain (up to 3 doses).     omeprazole 20 MG capsule  Commonly known as:  PRILOSEC  Take 1 capsule (20 mg total) by mouth daily.     QUEtiapine 50 MG tablet  Commonly known as:   SEROQUEL  Take 1 tablet (50 mg total) by mouth every morning.     risperiDONE 0.5 MG tablet  Commonly known as:  RISPERDAL  Take 1 tablet (0.5 mg total) by mouth 2 (two) times daily.     tiotropium 18 MCG inhalation capsule  Commonly known as:  SPIRIVA HANDIHALER  Place 1 capsule (18 mcg total) into inhaler and inhale daily.       Allergies  Allergen Reactions  . Bee Venom Shortness Of Breath and Swelling  . Shellfish Allergy Anaphylaxis  . Codeine Itching and Rash       Follow-up Information   Follow up with Siy-Hian, Rod Can, MD In 1 month.   Specialty:  Internal Medicine   Contact information:   [address] [place] [phone]        The results of significant diagnostics from this hospitalization (including imaging, microbiology, ancillary and laboratory) are listed below for reference.    Significant Diagnostic Studies: Dg Chest 2 View  11/26/2013   CLINICAL DATA:  Headache.  EXAM: CHEST  2 VIEW  COMPARISON:  CT chest 05/15/2011 and PA and lateral chest 08/27/2013.  FINDINGS: Lungs are markedly emphysematous but clear. Heart size is normal. No pneumothorax or pleural effusion.  IMPRESSION: Severe emphysema without acute disease.   Electronically Signed   By: Inge Rise M.D.   On: 11/26/2013 00:52   Dg Abd 1 View  11/26/2013   CLINICAL DATA:  Nausea and vomiting.  EXAM: ABDOMEN - 1 VIEW  COMPARISON:  02/05/2013  FINDINGS: There is an ostomy in the left abdomen. No gas dilated small or large bowel. Bowel sutures noted in the pelvis. Hyperinflated lung bases are clear of edema or consolidation. No acute osseous findings. No abnormal intra-abdominal mass effect.  IMPRESSION: Nonobstructive bowel gas pattern.   Electronically Signed   By: Jorje Guild M.D.   On: 11/26/2013 06:04   Ct Head Wo Contrast  11/26/2013   CLINICAL DATA:  Altered vision.  EXAM: CT HEAD WITHOUT CONTRAST  TECHNIQUE: Contiguous axial images were obtained from the base of  the skull through the vertex without intravenous contrast.  COMPARISON:  Head CT scan 08/10/2013.  Brain MRI 02/09/2013.  FINDINGS: Again seen is mild cortical atrophy. There is no evidence of acute abnormality including infarct, hemorrhage, mass lesion, mass effect, midline shift or abnormal extra-axial fluid collection. There is no hydrocephalus or pneumocephalus. The calvarium is intact. Small amount of left mastoid fluid is noted.  IMPRESSION: No acute finding.  Stable compared to prior exam.   Electronically Signed   By: Inge Rise M.D.   On: 11/26/2013 01:02   US Abdomen Complete  11/26/2013   CLINICAL DATA:  Nausea and vomiting  EXAM: ULTRASOUND ABDOMEN COMPLETE  COMPARISON:  None.  FINDINGS: Gallbladder:  No gallstones or wall thickening  visualized. No sonographic Murphy sign noted.  Common bile duct:  Diameter: 0.9 mm  Liver:  No focal lesion identified. Within normal limits in parenchymal echogenicity.  IVC:  No abnormality visualized.  Pancreas:  Visualized portion unremarkable.  Spleen:  Size and appearance within normal limits.  Right Kidney:  Length: 10.8 cm. Echogenicity within normal limits. No mass or hydronephrosis visualized.  Left Kidney:  Length: 10.3 cm. Echogenicity within normal limits. No mass or hydronephrosis visualized.  Abdominal aorta:  Atherosclerotic changes are noted without definitive aneurysmal dilatation.  Other findings:  None.  IMPRESSION: No acute abnormality noted.   Electronically Signed   By: Inez Catalina M.D.   On: 11/26/2013 10:08    Microbiology: No results found for this or any previous visit (from the past 240 hour(s)).   Labs: Basic Metabolic Panel:  Recent Labs Lab 11/25/13 2350 11/26/13 1145  NA 137 137  K 4.2 4.1  CL 94* 97  CO2 24 26  GLUCOSE 101* 100*  BUN 9 8  CREATININE 0.83 0.84  CALCIUM 9.0 8.5   Liver Function Tests:  Recent Labs Lab 11/25/13 2350 11/26/13 1145  AST 121* 112*  ALT 69* 65*  ALKPHOS 73 65  BILITOT 1.1 1.6*   PROT 7.0 6.5  ALBUMIN 3.4* 3.3*   No results found for this basename: LIPASE, AMYLASE,  in the last 168 hours  Recent Labs Lab 11/26/13 1146  AMMONIA 24   CBC:  Recent Labs Lab 11/25/13 2350 11/26/13 1145  WBC 8.0 7.2  NEUTROABS  --  4.4  HGB 14.2 13.6  HCT 39.9 38.8*  MCV 89.9 91.7  PLT 155 127*   Cardiac Enzymes:  Recent Labs Lab 11/25/13 2350 11/26/13 1146  TROPONINI <0.30 <0.30   BNP: BNP (last 3 results) No results found for this basename: PROBNP,  in the last 8760 hours CBG:  Recent Labs Lab 11/25/13 2246 11/26/13 0055 11/26/13 0357 11/26/13 1717  GLUCAP 140* 100* 63* 131*       Signed:  Maleeya Peterkin K  [facility] 11/26/2013, 7:00 PM

## 2013-11-26 NOTE — Progress Notes (Signed)
Called and received report from [place], Therapist, sports.

## 2013-11-26 NOTE — ED Notes (Signed)
Waiting on xray before transferring pt to the floor

## 2013-11-26 NOTE — Care Management Note (Signed)
    Page 1 of 1   11/26/2013     5:41:08 PM   CARE MANAGEMENT NOTE 11/26/2013  Patient:  Zachary Walls, Zachary Walls   Account Number:  [id number]  Date Initiated:  11/26/2013  Documentation initiated by:  Tomi Bamberger  Subjective/Objective Assessment:   dx hypoglycemia  admit- lives alone.     Action/Plan:   Anticipated DC Date:  11/28/2013   Anticipated DC Plan:  [place]  CM consult      Choice offered to / List presented to:             Status of service:  In process, will continue to follow Medicare Important Message given?   (If response is "NO", the following Medicare IM given date fields will be blank) Date Medicare IM given:   Date Additional Medicare IM given:    Discharge Disposition:    Per UR Regulation:  Reviewed for med. necessity/level of care/duration of stay  If discussed at [place] of Stay Meetings, dates discussed:    Comments:

## 2013-11-26 NOTE — Discharge Instructions (Signed)
Low Blood Sugar Low blood sugar (hypoglycemia) means that the level of sugar in your blood is lower than it should be. Signs of low blood sugar include:  Getting sweaty.  Feeling hungry.  Feeling dizzy or weak.  Feeling sleepier than normal.  Feeling nervous.  Headaches.  Having a fast heartbeat. Low blood sugar can happen fast and can be an emergency. Your doctor can do tests to check your blood sugar level. You can have low blood sugar and not have diabetes. HOME CARE  Check your blood sugar as told by your doctor. If it is less than 70 mg/dl or as told by your doctor, take 1 of the following:  3 to 4 glucose tablets.   cup clear juice.   cup soda pop, not diet.  1 cup milk.  5 to 6 hard candies.  Recheck blood sugar after 15 minutes. Repeat until it is at the right level.  Eat a snack if it is more than 1 hour until the next meal.  Only take medicine as told by your doctor.  Do not skip meals. Eat on time.  Do not drink alcohol except with meals.  Check your blood glucose before driving.  Check your blood glucose before and after exercise.  Always carry treatment with you, such as glucose pills.  Always wear a medical alert bracelet if you have diabetes. GET HELP RIGHT AWAY IF:   Your blood glucose goes below 70 mg/dl or as told by your doctor, and you:  Are confused.  Are not able to swallow.  Pass out (faint).  You cannot treat yourself. You may need someone to help you.  You have low blood sugar problems often.  You have problems from your medicines.  You are not feeling better after 3 to 4 days.  You have vision changes. MAKE SURE YOU:   Understand these instructions.  Will watch this condition.  Will get help right away if you are not doing well or get worse. Document Released: 01/02/2010 Document Revised: 12/31/2011 Document Reviewed: 01/02/2010 [facility] Patient Information 2014 [place], [place].

## 2013-11-26 NOTE — H&P (Addendum)
[facility] History and Physical  [name] QIW:[id number] DOB: 15-Mar-1956 DOA: 11/25/2013  Referring physician: ER physician. PCP: Adonis Brook, MD   Chief Complaint: Not doing well.  HPI: Zachary Walls is a 58 y.o. male history of alcohol abuse, COPD, hyperlipidemia, nonobstructive CAD, pulse abuse and has had cardiac arrest in April 2014 and has had cardiac catheter in November 2014 which showed nonobstructive CAD called the EMS because he was not doing well. Patient states that he's been feeling weak and blacking out over the last 2 weeks and not eating well because whenever he tries to eat he feels nauseated and vomits. When the EMS reached his house he was found to be altered and his CBG was 40. He was given D50 after which he became more alert and patient was brought to the ER. In the ER his blood sugar inserted drop and he was given another D50 and D5 normal saline has been started. CT of the head did not show anything acute. Patient has been admitted for further management. Patient states he also gets off and on chest pain. On exam patient is mildly wheezing but not short of breath. His abdomen has abdominal hernia which does not look obstructed.   Review of Systems: As presented in the history of presenting illness, rest negative.  Past Medical History  Diagnosis Date  . COPD (chronic obstructive pulmonary disease)   . History of pneumonia   . Hypertension     a. Normotensive 08/2013 off meds.  [name] HOH (hard of hearing)   . Protein calorie malnutrition   . ETOH abuse   . Tobacco abuse   . Diverticulitis     a. s/p colon resection and colostomy  . Cardiac arrest     a. 01/2013, felt to be 2/2 severe COPD req trach;  b. 01/2013 Echo: EF 65-70% w/o rwma.  . Difficult intubation   . CAD (coronary artery disease)     a. Nonobstructive CAD by cath 08/2013 (mod RCA, mid LAD myocardial bridge) - ECG was concerning for inf STEMI but pt ruled out. CP possibly GERD.   [name]  Hyponatremia     a. During 08/2013 felt due to polydipsia.  . Polysubstance abuse     a. Tobacco, marijuana, alcoholism.   Past Surgical History  Procedure Laterality Date  . Hernia repair      inguinal  . Colostomy  12/11/2011    Procedure: COLOSTOMY;  Surgeon: Rolm Bookbinder, MD;  Location: [facility];  Service: General;  Laterality: N/A;  . Colostomy revision  12/11/2011    Procedure: COLON RESECTION SIGMOID;  Surgeon: Rolm Bookbinder, MD;  Location: [facility];  Service: General;;  . Bowel resection  12/11/2011    Procedure: SMALL BOWEL RESECTION;  Surgeon: Rolm Bookbinder, MD;  Location: [facility];  Service: General;;  times 2  . Cystoscopy w/ ureteral stent placement  12/11/2011    Procedure: CYSTOSCOPY WITH STENT REPLACEMENT;  Surgeon: Malka So, MD;  Location: [facility];  Service: Urology;  Laterality: Left;  ureteral catheter placement  . Cardiac catheterization  08/25/2013  . Tracheostomy  01/2013    emergent d/t difficult intubation  . Colon surgery     Social History:  reports that he has been smoking Cigarettes.  He has a 22 pack-year smoking history. He has never used smokeless tobacco. He reports that he drinks alcohol. He reports that he uses illicit drugs. Where does patient live home. Can patient participate in ADLs? Yes.  Allergies  Allergen Reactions  . Bee Venom Shortness Of Breath and Swelling  . Shellfish Allergy Anaphylaxis  . Codeine Itching and Rash    Family History:  Family History  Problem Relation Age of Onset  . Heart attack Father   . Cancer Father     unknown type  . HIV Brother       Prior to Admission medications   Medication Sig Start Date End Date Taking? Authorizing Provider  albuterol (PROVENTIL) (2.5 MG/3ML) 0.083% nebulizer solution Take 2.5 mg by nebulization 3 (three) times daily.    Historical Provider, MD  aspirin EC 81 MG EC tablet Take 1 tablet (81 mg total) by mouth daily. 08/27/13   Dayna N Dunn, PA-C  atorvastatin (LIPITOR) 20 MG  tablet Take 1 tablet (20 mg total) by mouth every evening. 08/27/13   Dayna N Dunn, PA-C  clotrimazole (LOTRIMIN) 1 % cream Apply to affected area 2 times daily 08/10/13   Elmyra Ricks Pisciotta, PA-C  escitalopram (LEXAPRO) 10 MG tablet Take 1 tablet (10 mg total) by mouth daily. 08/10/13   Nicole Pisciotta, PA-C  folic acid (FOLVITE) 1 MG tablet Take 1 tablet (1 mg total) by mouth daily. 08/27/13   Dayna N Dunn, PA-C  HYDROcodone-acetaminophen (NORCO/VICODIN) 5-325 MG per tablet Take 1 tablet by mouth every 6 (six) hours as needed for moderate pain or severe pain.    Historical Provider, MD  Ipratropium-Albuterol (COMBIVENT RESPIMAT) 20-100 MCG/ACT AERS respimat Inhale 2 puffs into the lungs 4 (four) times daily. 08/10/13   Nicole Pisciotta, PA-C  LORazepam (ATIVAN) 0.5 MG tablet Take 1 tablet (0.5 mg total) by mouth every 8 (eight) hours as needed for anxiety. 08/10/13   Nicole Pisciotta, PA-C  Multiple Vitamin (MULTIVITAMIN WITH MINERALS) TABS tablet Take 1 tablet by mouth daily. 08/27/13   Dayna N Dunn, PA-C  nitroGLYCERIN (NITROSTAT) 0.4 MG SL tablet Place 1 tablet (0.4 mg total) under the tongue every 5 (five) minutes as needed for chest pain (up to 3 doses). 08/27/13   Dayna N Dunn, PA-C  omeprazole (PRILOSEC) 20 MG capsule Take 1 capsule (20 mg total) by mouth daily. 08/27/13   Dayna N Dunn, PA-C  QUEtiapine (SEROQUEL) 50 MG tablet Take 1 tablet (50 mg total) by mouth every morning. 08/10/13   Nicole Pisciotta, PA-C  risperiDONE (RISPERDAL) 0.5 MG tablet Take 1 tablet (0.5 mg total) by mouth 2 (two) times daily. 08/10/13   Nicole Pisciotta, PA-C  tiotropium (SPIRIVA HANDIHALER) 18 MCG inhalation capsule Place 1 capsule (18 mcg total) into inhaler and inhale daily. 08/10/13   Monico Blitz, PA-C    Physical Exam: Filed Vitals:   11/26/13 0430 11/26/13 0445 11/26/13 0515 11/26/13 0515  BP:  150/84 153/76 150/84  Pulse: 76 68 85 68  Temp:      TempSrc:      Resp: 29 13 17    SpO2: 98% 98% 99%       General:  Well-developed poorly nourished.  Eyes: Anicteric no pallor.  ENT: No discharge from ears eyes nose mouth.  Neck: No mass felt.  Cardiovascular: S1-S2 heard.  Respiratory: Bilateral expiratory wheeze heard no crepitations.  Abdomen: Small tender points on his abdomen and has hernia does not look obstructed. Bowel sounds present. No guarding rigidity.  Skin: No rash.  Musculoskeletal: No edema.  Psychiatric: Patient is alert and awake and oriented to time place and person.  Neurologic: Moves all extremities 5 x 5.  Labs on Admission:  Basic Metabolic Panel:  Recent Labs  Lab 11/25/13 2350  NA 137  K 4.2  CL 94*  CO2 24  GLUCOSE 101*  BUN 9  CREATININE 0.83  CALCIUM 9.0   Liver Function Tests:  Recent Labs Lab 11/25/13 2350  AST 121*  ALT 69*  ALKPHOS 73  BILITOT 1.1  PROT 7.0  ALBUMIN 3.4*   No results found for this basename: LIPASE, AMYLASE,  in the last 168 hours No results found for this basename: AMMONIA,  in the last 168 hours CBC:  Recent Labs Lab 11/25/13 2350  WBC 8.0  HGB 14.2  HCT 39.9  MCV 89.9  PLT 155   Cardiac Enzymes:  Recent Labs Lab 11/25/13 2350  TROPONINI <0.30    BNP (last 3 results) No results found for this basename: PROBNP,  in the last 8760 hours CBG:  Recent Labs Lab 11/25/13 2246 11/26/13 0055 11/26/13 0357  GLUCAP 140* 100* 63*    Radiological Exams on Admission: Dg Chest 2 View  11/26/2013   CLINICAL DATA:  Headache.  EXAM: CHEST  2 VIEW  COMPARISON:  CT chest 05/15/2011 and PA and lateral chest 08/27/2013.  FINDINGS: Lungs are markedly emphysematous but clear. Heart size is normal. No pneumothorax or pleural effusion.  IMPRESSION: Severe emphysema without acute disease.   Electronically Signed   By: Inge Rise M.D.   On: 11/26/2013 00:52   Dg Abd 1 View  11/26/2013   CLINICAL DATA:  Nausea and vomiting.  EXAM: ABDOMEN - 1 VIEW  COMPARISON:  02/05/2013  FINDINGS: There is an ostomy  in the left abdomen. No gas dilated small or large bowel. Bowel sutures noted in the pelvis. Hyperinflated lung bases are clear of edema or consolidation. No acute osseous findings. No abnormal intra-abdominal mass effect.  IMPRESSION: Nonobstructive bowel gas pattern.   Electronically Signed   By: Jorje Guild M.D.   On: 11/26/2013 06:04   Ct Head Wo Contrast  11/26/2013   CLINICAL DATA:  Altered vision.  EXAM: CT HEAD WITHOUT CONTRAST  TECHNIQUE: Contiguous axial images were obtained from the base of the skull through the vertex without intravenous contrast.  COMPARISON:  Head CT scan 08/10/2013.  Brain MRI 02/09/2013.  FINDINGS: Again seen is mild cortical atrophy. There is no evidence of acute abnormality including infarct, hemorrhage, mass lesion, mass effect, midline shift or abnormal extra-axial fluid collection. There is no hydrocephalus or pneumocephalus. The calvarium is intact. Small amount of left mastoid fluid is noted.  IMPRESSION: No acute finding.  Stable compared to prior exam.   Electronically Signed   By: Inge Rise M.D.   On: 11/26/2013 01:02    EKG: Independently reviewed. Normal sinus rhythm with inferior leads ST-T changes in lead 2 and 3 and anterior leads V1 and V2 has T-wave inversions comparable to the old EKG in November 2014.  Assessment/Plan Principal Problem:   Hypoglycemia Active Problems:   COPD (chronic obstructive pulmonary disease)   Hypertension   Severe protein-calorie malnutrition   Chest pain   Polysubstance (excluding opioids) dependence   1. Hypoglycemia - probably secondary to poor oral intake. At this time because of recurrent hypoglycemic episodes I have continued patient on D5 normal saline at 50 cc per and closely follow CBGs for now. Patient's hypoglycemia is probably from poor oral intake and alcoholism. 2. Poor oral intake and severe protein calorie malnutrition - patient states whenever he tries to eat he gets nauseated and throws up.  X-ray of the abdomen is unremarkable. He does have hernia. The  hernia doesn't look obstructed at this time. I will get a sonogram of the abdomen to check for his gallbladder. 3. Elevated LFTs - probably from alcoholism. Check sonogram of abdomen due to the nausea and vomiting specifically for gallbladder. Follow LFTs. 4. Alcohol abuse and polysubstance abuse - patient has been placed on Ativan alcohol withdrawal protocol with thiamine. Patient has been already ordered one dose of thiamine IV stat. Social worker consult. 5. COPD - patient is wheezing but not short of breath and able to complete sentences without difficulty. I have placed patient on nebulizer albuterol and Atrovent along with Pulmicort. 6. History of hyperlipidemia - his LFTs continued to worsen hold statins. 7. Chest pain with history of nonobstructive CAD - patient states her last few days is being getting chest pains. Presently chest pain-free. EKG has changes comparable to old EKG. Patient has had cardiac catheter in November 2014 which showed nonobstructive CAD. At this time we will cycle cardiac markers continue aspirin. Patient is also on Protonix. 8. History of colostomy bag placement. 9. History of cardiac arrest secondary to COPD and alcohol.  I have reviewed patient's old charts and labs.  Code Status: Full code.  Family Communication: None.  Disposition Plan: Admit to inpatient.    Areona Homer N. [facility] Pager [phone].  If 7PM-7AM, please contact night-coverage [email or web address] Password [email or web address] 11/26/2013, 6:07 AM

## 2013-11-26 NOTE — ED Notes (Signed)
Son TJ called- [phone]. Updated on plan of care.

## 2013-11-26 NOTE — Progress Notes (Signed)
Patient alert and oriented. Patient discharge instruction reviewed patient and verbalized understanding. Pateint was hard of hearing . IV removed. Small infiltrate noted and ice pack given. Prescriptions faxed to pharmacy.

## 2013-12-08 ENCOUNTER — Observation Stay (HOSPITAL_COMMUNITY)
Admission: EM | Admit: 2013-12-08 | Discharge: 2013-12-10 | Disposition: A | Discharge status: Home / Self Care | Payer: Medicaid Other | Attending: Internal Medicine | Admitting: Internal Medicine

## 2013-12-08 ENCOUNTER — Encounter (HOSPITAL_COMMUNITY): Payer: Self-pay | Admitting: Emergency Medicine

## 2013-12-08 DIAGNOSIS — J449 Chronic obstructive pulmonary disease, unspecified: Secondary | ICD-10-CM | POA: Diagnosis present

## 2013-12-08 DIAGNOSIS — R4182 Altered mental status, unspecified: Secondary | ICD-10-CM

## 2013-12-08 DIAGNOSIS — E870 Hyperosmolality and hypernatremia: Secondary | ICD-10-CM

## 2013-12-08 DIAGNOSIS — R251 Tremor, unspecified: Secondary | ICD-10-CM | POA: Diagnosis present

## 2013-12-08 DIAGNOSIS — J9620 Acute and chronic respiratory failure, unspecified whether with hypoxia or hypercapnia: Secondary | ICD-10-CM

## 2013-12-08 DIAGNOSIS — F13239 Sedative, hypnotic or anxiolytic dependence with withdrawal, unspecified: Secondary | ICD-10-CM | POA: Diagnosis present

## 2013-12-08 DIAGNOSIS — Z7982 Long term (current) use of aspirin: Secondary | ICD-10-CM | POA: Insufficient documentation

## 2013-12-08 DIAGNOSIS — E43 Unspecified severe protein-calorie malnutrition: Secondary | ICD-10-CM

## 2013-12-08 DIAGNOSIS — H9190 Unspecified hearing loss, unspecified ear: Secondary | ICD-10-CM

## 2013-12-08 DIAGNOSIS — I469 Cardiac arrest, cause unspecified: Secondary | ICD-10-CM

## 2013-12-08 DIAGNOSIS — J4489 Other specified chronic obstructive pulmonary disease: Secondary | ICD-10-CM | POA: Insufficient documentation

## 2013-12-08 DIAGNOSIS — R079 Chest pain, unspecified: Secondary | ICD-10-CM

## 2013-12-08 DIAGNOSIS — Z8674 Personal history of sudden cardiac arrest: Secondary | ICD-10-CM | POA: Insufficient documentation

## 2013-12-08 DIAGNOSIS — IMO0002 Reserved for concepts with insufficient information to code with codable children: Secondary | ICD-10-CM

## 2013-12-08 DIAGNOSIS — F1920 Other psychoactive substance dependence, uncomplicated: Secondary | ICD-10-CM | POA: Diagnosis present

## 2013-12-08 DIAGNOSIS — Z79899 Other long term (current) drug therapy: Secondary | ICD-10-CM | POA: Insufficient documentation

## 2013-12-08 DIAGNOSIS — E871 Hypo-osmolality and hyponatremia: Secondary | ICD-10-CM

## 2013-12-08 DIAGNOSIS — G931 Anoxic brain damage, not elsewhere classified: Secondary | ICD-10-CM

## 2013-12-08 DIAGNOSIS — I10 Essential (primary) hypertension: Secondary | ICD-10-CM

## 2013-12-08 DIAGNOSIS — Z930 Tracheostomy status: Secondary | ICD-10-CM

## 2013-12-08 DIAGNOSIS — F411 Generalized anxiety disorder: Secondary | ICD-10-CM | POA: Insufficient documentation

## 2013-12-08 DIAGNOSIS — E162 Hypoglycemia, unspecified: Secondary | ICD-10-CM

## 2013-12-08 DIAGNOSIS — Z885 Allergy status to narcotic agent status: Secondary | ICD-10-CM | POA: Insufficient documentation

## 2013-12-08 DIAGNOSIS — F1010 Alcohol abuse, uncomplicated: Secondary | ICD-10-CM | POA: Diagnosis present

## 2013-12-08 DIAGNOSIS — F17200 Nicotine dependence, unspecified, uncomplicated: Secondary | ICD-10-CM | POA: Insufficient documentation

## 2013-12-08 DIAGNOSIS — I2510 Atherosclerotic heart disease of native coronary artery without angina pectoris: Secondary | ICD-10-CM | POA: Insufficient documentation

## 2013-12-08 DIAGNOSIS — Z91013 Allergy to seafood: Secondary | ICD-10-CM | POA: Insufficient documentation

## 2013-12-08 DIAGNOSIS — R259 Unspecified abnormal involuntary movements: Principal | ICD-10-CM | POA: Insufficient documentation

## 2013-12-08 DIAGNOSIS — F13939 Sedative, hypnotic or anxiolytic use, unspecified with withdrawal, unspecified: Secondary | ICD-10-CM | POA: Diagnosis present

## 2013-12-08 DIAGNOSIS — E46 Unspecified protein-calorie malnutrition: Secondary | ICD-10-CM | POA: Insufficient documentation

## 2013-12-08 LAB — URINALYSIS, ROUTINE W REFLEX MICROSCOPIC
Bilirubin Urine: NEGATIVE
Glucose, UA: NEGATIVE mg/dL
HGB URINE DIPSTICK: NEGATIVE
Ketones, ur: NEGATIVE mg/dL
LEUKOCYTES UA: NEGATIVE
Nitrite: NEGATIVE
PROTEIN: NEGATIVE mg/dL
Specific Gravity, Urine: 1.02 (ref 1.005–1.030)
UROBILINOGEN UA: 1 mg/dL (ref 0.0–1.0)
pH: 5 (ref 5.0–8.0)

## 2013-12-08 LAB — POCT I-STAT, CHEM 8
BUN: 10 mg/dL (ref 6–23)
CALCIUM ION: 1.15 mmol/L (ref 1.12–1.23)
CHLORIDE: 98 meq/L (ref 96–112)
Creatinine, Ser: 1 mg/dL (ref 0.50–1.35)
GLUCOSE: 95 mg/dL (ref 70–99)
HCT: 49 % (ref 39.0–52.0)
HEMOGLOBIN: 16.7 g/dL (ref 13.0–17.0)
Potassium: 4.6 mEq/L (ref 3.7–5.3)
Sodium: 139 mEq/L (ref 137–147)
TCO2: 31 mmol/L (ref 0–100)

## 2013-12-08 MED ORDER — LORAZEPAM 2 MG/ML IJ SOLN
0.5000 mg | Freq: Once | INTRAMUSCULAR | Status: AC
  Administered 2013-12-09: 0.5 mg via INTRAVENOUS
  Filled 2013-12-08: qty 1

## 2013-12-08 NOTE — ED Notes (Signed)
MD at bedside. 

## 2013-12-08 NOTE — ED Notes (Addendum)
Per EMS: Pt from home with c/o generalized muscle cramps/twitching since AM. Pt AO x 4. Pt denies CP, N/V. States he has hx of the same. Neuro intact. Reports drinking beer today and marijuana use. States been out of pain rx appx 2 months. 150 palp. 90 bpm. 18 RR. 103 CBG.

## 2013-12-09 DIAGNOSIS — F13239 Sedative, hypnotic or anxiolytic dependence with withdrawal, unspecified: Secondary | ICD-10-CM | POA: Diagnosis present

## 2013-12-09 DIAGNOSIS — I2510 Atherosclerotic heart disease of native coronary artery without angina pectoris: Secondary | ICD-10-CM

## 2013-12-09 DIAGNOSIS — R259 Unspecified abnormal involuntary movements: Principal | ICD-10-CM

## 2013-12-09 DIAGNOSIS — R251 Tremor, unspecified: Secondary | ICD-10-CM | POA: Diagnosis present

## 2013-12-09 DIAGNOSIS — F19939 Other psychoactive substance use, unspecified with withdrawal, unspecified: Secondary | ICD-10-CM

## 2013-12-09 DIAGNOSIS — F13939 Sedative, hypnotic or anxiolytic use, unspecified with withdrawal, unspecified: Secondary | ICD-10-CM | POA: Diagnosis present

## 2013-12-09 DIAGNOSIS — J449 Chronic obstructive pulmonary disease, unspecified: Secondary | ICD-10-CM

## 2013-12-09 DIAGNOSIS — F1920 Other psychoactive substance dependence, uncomplicated: Secondary | ICD-10-CM

## 2013-12-09 DIAGNOSIS — F1520 Other stimulant dependence, uncomplicated: Secondary | ICD-10-CM

## 2013-12-09 DIAGNOSIS — F1010 Alcohol abuse, uncomplicated: Secondary | ICD-10-CM | POA: Diagnosis present

## 2013-12-09 LAB — CBC WITH DIFFERENTIAL/PLATELET
Basophils Absolute: 0.1 10*3/uL (ref 0.0–0.1)
Basophils Relative: 2 % — ABNORMAL HIGH (ref 0–1)
Eosinophils Absolute: 0.1 10*3/uL (ref 0.0–0.7)
Eosinophils Relative: 1 % (ref 0–5)
HCT: 43.4 % (ref 39.0–52.0)
Hemoglobin: 15 g/dL (ref 13.0–17.0)
LYMPHS PCT: 26 % (ref 12–46)
Lymphs Abs: 2.2 10*3/uL (ref 0.7–4.0)
MCH: 32.3 pg (ref 26.0–34.0)
MCHC: 34.6 g/dL (ref 30.0–36.0)
MCV: 93.3 fL (ref 78.0–100.0)
MONOS PCT: 10 % (ref 3–12)
Monocytes Absolute: 0.8 10*3/uL (ref 0.1–1.0)
NEUTROS ABS: 5.1 10*3/uL (ref 1.7–7.7)
NEUTROS PCT: 62 % (ref 43–77)
Platelets: 247 10*3/uL (ref 150–400)
RBC: 4.65 MIL/uL (ref 4.22–5.81)
RDW: 15 % (ref 11.5–15.5)
WBC: 8.3 10*3/uL (ref 4.0–10.5)

## 2013-12-09 LAB — GLUCOSE, CAPILLARY: Glucose-Capillary: 86 mg/dL (ref 70–99)

## 2013-12-09 LAB — COMPREHENSIVE METABOLIC PANEL
ALT: 42 U/L (ref 0–53)
AST: 49 U/L — ABNORMAL HIGH (ref 0–37)
Albumin: 3.7 g/dL (ref 3.5–5.2)
Alkaline Phosphatase: 99 U/L (ref 39–117)
BUN: 11 mg/dL (ref 6–23)
CALCIUM: 9.3 mg/dL (ref 8.4–10.5)
CO2: 29 meq/L (ref 19–32)
Chloride: 97 mEq/L (ref 96–112)
Creatinine, Ser: 0.93 mg/dL (ref 0.50–1.35)
Glucose, Bld: 93 mg/dL (ref 70–99)
Potassium: 4.6 mEq/L (ref 3.7–5.3)
Sodium: 139 mEq/L (ref 137–147)
TOTAL PROTEIN: 7.5 g/dL (ref 6.0–8.3)
Total Bilirubin: 0.4 mg/dL (ref 0.3–1.2)

## 2013-12-09 LAB — ETHANOL: Alcohol, Ethyl (B): <11 mg/dL (ref 0–11)

## 2013-12-09 MED ORDER — RISPERIDONE 0.5 MG PO TABS
0.5000 mg | ORAL_TABLET | Freq: Two times a day (BID) | ORAL | Status: DC
  Administered 2013-12-09 – 2013-12-10 (×3): 0.5 mg via ORAL
  Filled 2013-12-09 (×4): qty 1

## 2013-12-09 MED ORDER — QUETIAPINE FUMARATE 50 MG PO TABS
50.0000 mg | ORAL_TABLET | Freq: Every morning | ORAL | Status: DC
  Administered 2013-12-09 – 2013-12-10 (×2): 50 mg via ORAL
  Filled 2013-12-09 (×2): qty 1

## 2013-12-09 MED ORDER — THIAMINE HCL 100 MG/ML IJ SOLN
100.0000 mg | Freq: Every day | INTRAMUSCULAR | Status: DC
  Filled 2013-12-09 (×2): qty 1

## 2013-12-09 MED ORDER — NITROGLYCERIN 0.4 MG SL SUBL: 0.4000 mg | SUBLINGUAL_TABLET | SUBLINGUAL | Status: DC | PRN

## 2013-12-09 MED ORDER — FOLIC ACID 1 MG PO TABS
1.0000 mg | ORAL_TABLET | Freq: Once | ORAL | Status: AC
  Administered 2013-12-09: 1 mg via ORAL
  Filled 2013-12-09: qty 1

## 2013-12-09 MED ORDER — LORAZEPAM 0.5 MG PO TABS
0.5000 mg | ORAL_TABLET | Freq: Three times a day (TID) | ORAL | Status: DC | PRN
  Administered 2013-12-09 – 2013-12-10 (×2): 0.5 mg via ORAL
  Filled 2013-12-09 (×2): qty 1

## 2013-12-09 MED ORDER — FOLIC ACID 1 MG PO TABS
1.0000 mg | ORAL_TABLET | Freq: Every day | ORAL | Status: DC
  Filled 2013-12-09: qty 1

## 2013-12-09 MED ORDER — ATORVASTATIN CALCIUM 20 MG PO TABS
20.0000 mg | ORAL_TABLET | Freq: Every evening | ORAL | Status: DC
  Administered 2013-12-09 – 2013-12-10 (×2): 20 mg via ORAL
  Filled 2013-12-09 (×2): qty 1

## 2013-12-09 MED ORDER — THIAMINE HCL 100 MG/ML IJ SOLN
Freq: Once | INTRAVENOUS | Status: AC
  Administered 2013-12-09: 06:00:00 via INTRAVENOUS
  Filled 2013-12-09: qty 1000

## 2013-12-09 MED ORDER — LORAZEPAM 2 MG/ML IJ SOLN
1.0000 mg | Freq: Once | INTRAMUSCULAR | Status: AC
  Administered 2013-12-09: 1 mg via INTRAVENOUS
  Filled 2013-12-09: qty 1

## 2013-12-09 MED ORDER — ADULT MULTIVITAMIN W/MINERALS CH
1.0000 | ORAL_TABLET | Freq: Every day | ORAL | Status: DC
  Administered 2013-12-09 – 2013-12-10 (×2): 1 via ORAL
  Filled 2013-12-09 (×2): qty 1

## 2013-12-09 MED ORDER — ENSURE COMPLETE PO LIQD
237.0000 mL | Freq: Two times a day (BID) | ORAL | Status: DC
  Administered 2013-12-09 – 2013-12-10 (×3): 237 mL via ORAL

## 2013-12-09 MED ORDER — ASPIRIN EC 81 MG PO TBEC
81.0000 mg | DELAYED_RELEASE_TABLET | Freq: Every day | ORAL | Status: DC
  Administered 2013-12-09 – 2013-12-10 (×2): 81 mg via ORAL
  Filled 2013-12-09 (×2): qty 1

## 2013-12-09 MED ORDER — PANTOPRAZOLE SODIUM 40 MG PO TBEC
40.0000 mg | DELAYED_RELEASE_TABLET | Freq: Every day | ORAL | Status: DC
  Administered 2013-12-09 – 2013-12-10 (×2): 40 mg via ORAL
  Filled 2013-12-09 (×2): qty 1

## 2013-12-09 MED ORDER — LORAZEPAM 1 MG PO TABS
1.0000 mg | ORAL_TABLET | Freq: Four times a day (QID) | ORAL | Status: DC | PRN
Start: 2013-12-09 — End: 2013-12-10
  Administered 2013-12-09 – 2013-12-10 (×2): 1 mg via ORAL
  Filled 2013-12-09 (×2): qty 1

## 2013-12-09 MED ORDER — IPRATROPIUM-ALBUTEROL 0.5-2.5 (3) MG/3ML IN SOLN: 3.0000 mL | Freq: Four times a day (QID) | RESPIRATORY_TRACT | Status: DC

## 2013-12-09 MED ORDER — ESCITALOPRAM OXALATE 10 MG PO TABS
10.0000 mg | ORAL_TABLET | Freq: Every day | ORAL | Status: DC
  Administered 2013-12-09 – 2013-12-10 (×2): 10 mg via ORAL
  Filled 2013-12-09 (×2): qty 1

## 2013-12-09 MED ORDER — TIOTROPIUM BROMIDE MONOHYDRATE 18 MCG IN CAPS
18.0000 ug | ORAL_CAPSULE | Freq: Every day | RESPIRATORY_TRACT | Status: DC
  Administered 2013-12-09 – 2013-12-10 (×2): 18 ug via RESPIRATORY_TRACT
  Filled 2013-12-09: qty 5

## 2013-12-09 MED ORDER — ALBUTEROL SULFATE (2.5 MG/3ML) 0.083% IN NEBU
2.5000 mg | INHALATION_SOLUTION | Freq: Three times a day (TID) | RESPIRATORY_TRACT | Status: DC
  Administered 2013-12-09 – 2013-12-10 (×5): 2.5 mg via RESPIRATORY_TRACT
  Filled 2013-12-09 (×5): qty 3

## 2013-12-09 MED ORDER — ADULT MULTIVITAMIN W/MINERALS CH
1.0000 | ORAL_TABLET | Freq: Every day | ORAL | Status: DC
  Filled 2013-12-09: qty 1

## 2013-12-09 MED ORDER — THIAMINE HCL 100 MG/ML IJ SOLN
100.0000 mg | Freq: Once | INTRAMUSCULAR | Status: AC
  Administered 2013-12-09: 100 mg via INTRAVENOUS
  Filled 2013-12-09: qty 2

## 2013-12-09 MED ORDER — FOLIC ACID 1 MG PO TABS
1.0000 mg | ORAL_TABLET | Freq: Every day | ORAL | Status: DC
  Administered 2013-12-09 – 2013-12-10 (×2): 1 mg via ORAL
  Filled 2013-12-09 (×2): qty 1

## 2013-12-09 MED ORDER — HYDROCODONE-ACETAMINOPHEN 5-325 MG PO TABS
1.0000 | ORAL_TABLET | Freq: Four times a day (QID) | ORAL | Status: DC | PRN
  Administered 2013-12-09 – 2013-12-10 (×4): 1 via ORAL
  Filled 2013-12-09 (×4): qty 1

## 2013-12-09 MED ORDER — VITAMIN B-1 100 MG PO TABS
100.0000 mg | ORAL_TABLET | Freq: Every day | ORAL | Status: DC
  Administered 2013-12-09 – 2013-12-10 (×2): 100 mg via ORAL
  Filled 2013-12-09 (×2): qty 1

## 2013-12-09 MED ORDER — LORAZEPAM 2 MG/ML IJ SOLN: 1.0000 mg | Freq: Four times a day (QID) | INTRAMUSCULAR | Status: DC | PRN

## 2013-12-09 NOTE — H&P (Signed)
PCP:   Adonis Brook, MD   Chief Complaint:  Stuttering and shaking  HPI: 58 yo male h/o etoh abuse, CAD, polysubstance abuse, copd comes in for persistent stuttering and shaking in all four arms /legs that have been relieved with iv ativan in the ED.  Pt told EDP that he ran out of his benzos a week ago, but he tells me 2 months ago.  He is still drinking etoh but not as heavily, last drink earlier today.  Denies fevers.  No n/v/d.  No pain.  No sob.  His shaking and stuttering have resolved at this time.  Feels a little overall weak at this time.  No slurred speech, no weakness focally.  No swelling or edema anywhere.  Review of Systems:  Positive and negative as per HPI otherwise all other systems are negative  Past Medical History: Past Medical History  Diagnosis Date  . COPD (chronic obstructive pulmonary disease)   . History of pneumonia   . Hypertension     a. Normotensive 08/2013 off meds.  [name] HOH (hard of hearing)   . Protein calorie malnutrition   . ETOH abuse   . Tobacco abuse   . Diverticulitis     a. s/p colon resection and colostomy  . Cardiac arrest     a. 01/2013, felt to be 2/2 severe COPD req trach;  b. 01/2013 Echo: EF 65-70% w/o rwma.  . Difficult intubation   . CAD (coronary artery disease)     a. Nonobstructive CAD by cath 08/2013 (mod RCA, mid LAD myocardial bridge) - ECG was concerning for inf STEMI but pt ruled out. CP possibly GERD.   [name] Hyponatremia     a. During 08/2013 felt due to polydipsia.  . Polysubstance abuse     a. Tobacco, marijuana, alcoholism.  [name] Anxiety   . Shortness of breath   . Peripheral vascular disease    Past Surgical History  Procedure Laterality Date  . Hernia repair      inguinal  . Colostomy  12/11/2011    Procedure: COLOSTOMY;  Surgeon: Rolm Bookbinder, MD;  Location: [facility];  Service: General;  Laterality: N/A;  . Colostomy revision  12/11/2011    Procedure: COLON RESECTION SIGMOID;  Surgeon: Rolm Bookbinder, MD;  Location: [facility];  Service: General;;  . Bowel resection  12/11/2011    Procedure: SMALL BOWEL RESECTION;  Surgeon: Rolm Bookbinder, MD;  Location: [facility];  Service: General;;  times 2  . Cystoscopy w/ ureteral stent placement  12/11/2011    Procedure: CYSTOSCOPY WITH STENT REPLACEMENT;  Surgeon: Malka So, MD;  Location: [facility];  Service: Urology;  Laterality: Left;  ureteral catheter placement  . Cardiac catheterization  08/25/2013  . Tracheostomy  01/2013    emergent d/t difficult intubation  . Colon surgery    . Tracheostomy closure      Medications: Prior to Admission medications   Medication Sig Start Date End Date Taking? Authorizing Provider  albuterol (PROVENTIL) (2.5 MG/3ML) 0.083% nebulizer solution Take 2.5 mg by nebulization 3 (three) times daily.   Yes Historical Provider, MD  aspirin EC 81 MG EC tablet Take 1 tablet (81 mg total) by mouth daily. 08/27/13  Yes Dayna N Dunn, PA-C  atorvastatin (LIPITOR) 20 MG tablet Take 1 tablet (20 mg total) by mouth every evening. 11/26/13  Yes Annita Brod, MD  clotrimazole (LOTRIMIN) 1 % cream Apply to affected area 2 times daily 08/10/13  Yes Monico Blitz, PA-C  escitalopram (LEXAPRO) 10 MG tablet Take 1 tablet (10 mg total) by mouth daily. 11/26/13  Yes Annita Brod, MD  folic acid (FOLVITE) 1 MG tablet Take 1 tablet (1 mg total) by mouth daily. 08/27/13  Yes Dayna N Dunn, PA-C  HYDROcodone-acetaminophen (NORCO/VICODIN) 5-325 MG per tablet Take 1 tablet by mouth every 6 (six) hours as needed for moderate pain or severe pain.   Yes Historical Provider, MD  Ipratropium-Albuterol (COMBIVENT RESPIMAT) 20-100 MCG/ACT AERS respimat Inhale 2 puffs into the lungs 4 (four) times daily. 08/10/13  Yes Nicole Pisciotta, PA-C  LORazepam (ATIVAN) 0.5 MG tablet Take 1 tablet (0.5 mg total) by mouth every 8 (eight) hours as needed for anxiety. 08/10/13  Yes Nicole Pisciotta, PA-C  Multiple Vitamin (MULTIVITAMIN WITH MINERALS)  TABS tablet Take 1 tablet by mouth daily. 08/27/13  Yes Dayna N Dunn, PA-C  nitroGLYCERIN (NITROSTAT) 0.4 MG SL tablet Place 1 tablet (0.4 mg total) under the tongue every 5 (five) minutes as needed for chest pain (up to 3 doses). 08/27/13  Yes Dayna N Dunn, PA-C  omeprazole (PRILOSEC) 20 MG capsule Take 1 capsule (20 mg total) by mouth daily. 11/26/13  Yes Annita Brod, MD  QUEtiapine (SEROQUEL) 50 MG tablet Take 1 tablet (50 mg total) by mouth every morning. 11/26/13  Yes Annita Brod, MD  risperiDONE (RISPERDAL) 0.5 MG tablet Take 1 tablet (0.5 mg total) by mouth 2 (two) times daily. 11/26/13  Yes Annita Brod, MD  tiotropium (SPIRIVA HANDIHALER) 18 MCG inhalation capsule Place 1 capsule (18 mcg total) into inhaler and inhale daily. 08/10/13  Yes Nicole Pisciotta, PA-C    Allergies:   Allergies  Allergen Reactions  . Bee Venom Shortness Of Breath and Swelling  . Shellfish Allergy Anaphylaxis  . Codeine Itching and Rash    Social History:  reports that he has been smoking Cigarettes.  He has a 22 pack-year smoking history. He has never used smokeless tobacco. He reports that he drinks alcohol. He reports that he uses illicit drugs (Marijuana).  Family History: Family History  Problem Relation Age of Onset  . Heart attack Father   . Cancer Father     unknown type  . HIV Brother     Physical Exam: Filed Vitals:   12/08/13 2330 12/09/13 0015 12/09/13 0130 12/09/13 0145  BP: 166/87 155/88 135/91 139/81  Pulse: 96 93 95 90  Temp:      TempSrc:      Resp: 15 13 24 24   SpO2: 95% 94% 95% 95%   General appearance: alert, cooperative and no distress Head: Normocephalic, without obvious abnormality, atraumatic Eyes: negative Nose: Nares normal. Septum midline. Mucosa normal. No drainage or sinus tenderness. Neck: no JVD and supple, symmetrical, trachea midline Lungs: clear to auscultation bilaterally Heart: regular rate and rhythm, S1, S2 normal, no murmur, click, rub or  gallop Abdomen: soft, non-tender; bowel sounds normal; no masses,  no organomegaly Extremities: extremities normal, atraumatic, no cyanosis or edema Pulses: 2+ and symmetric Skin: Skin color, texture, turgor normal. No rashes or lesions Neurologic: Grossly normal    Labs on Admission:   Recent Labs  12/08/13 2337 12/08/13 2349  NA 139 139  K 4.6 4.6  CL 97 98  CO2 29  --   GLUCOSE 93 95  BUN 11 10  CREATININE 0.93 1.00  CALCIUM 9.3  --     Recent Labs  12/08/13 2337  AST 49*  ALT 42  ALKPHOS 99  BILITOT 0.4  PROT 7.5  ALBUMIN 3.7    Recent Labs  12/08/13 2337 12/08/13 2349  WBC 8.3  --   NEUTROABS 5.1  --   HGB 15.0 16.7  HCT 43.4 49.0  MCV 93.3  --   PLT 247  --     Radiological Exams on Admission: none  Assessment/Plan 58 yo male with stuttering/shaking resolved with iv ativan with possible etoh vs benzo withdrawal with h/o also of psychogenic seizures  Principal Problem:   Benzodiazepine withdrawal- vs etoh withdrawal vs psychogenic.  Place on ciwa pathway, resume his home dosing of lorazepam.  Symptoms have resolved at this time.  obs overnight.  bananna bag ordered.  Active Problems:   COPD (chronic obstructive pulmonary disease)  stable   Polysubstance (excluding opioids) dependence     ETOH abuse  Continued but less volume   Shaking    Jop[place] Canty A 12/09/2013, 2:37 AM

## 2013-12-09 NOTE — Evaluation (Signed)
Physical Therapy Evaluation Patient Details Name: Zachary Walls MRN: [id number] DOB: [birth year] Today's Date: 12/09/2013 Time: 7654-6503 PT Time Calculation (min): 18 min  PT Assessment / Plan / Recommendation History of Present Illness  58 yo male h/o etoh abuse, CAD, polysubstance abuse, copd comes in for persistent stuttering and shaking in all four arms /legs that have been relieved with iv ativan in the ED.  Pt told EDP that he ran out of his benzos a week ago, but he tells me 2 months ago.  He is still drinking etoh but not as heavily.  Clinical Impression  Pt adm due to the above. Presents with decline in functional mobility secondary to deficits indicated below (See PT problem list). Pt to benefit from skilled acute PT to address deficits and increase mobility prior to D/C. Pt reports multiple falls in last month. Pt lives alone but has family to check on him as needed. PT recommended SNF for post acute rehab to address high level balance deficits and increase his independence prior to returning home alone. If family can provide 24/7 (A) pt may be safe to D/C home with them and RW for mobility. Pt is a fall risk.     PT Assessment  Patient needs continued PT services    Follow Up Recommendations  SNF;Supervision/Assistance - 24 hour    Does the patient have the potential to tolerate intense rehabilitation      Barriers to Discharge Decreased caregiver support lives alone    Equipment Recommendations  Rolling walker with 5" wheels    Recommendations for Other Services OT consult   Frequency Min 3X/week    Precautions / Restrictions Precautions Precautions: Fall Precaution Comments: pt reports mulitple falls at home  Restrictions Weight Bearing Restrictions: No   Pertinent Vitals/Pain No complaints.      Mobility  Bed Mobility Overal bed mobility: Needs Assistance Bed Mobility: Supine to Sit Supine to sit: Supervision General bed mobility comments: cues for hand  placement and sequencing; pt impulsive with transfers  Transfers Overall transfer level: Needs assistance Equipment used: Rolling walker (2 wheeled);Ambulation equipment used Transfers: Sit to/from Stand Sit to Stand: Min assist General transfer comment: pt very unsteady with transfers and had tremors in LEs during transfers; cues for hand placement and sequencing with RW  Ambulation/Gait Ambulation/Gait assistance: Min assist Ambulation Distance (Feet): 14 Feet Assistive device: Rolling walker (2 wheeled) Gait Pattern/deviations: Shuffle;Ataxic;Step-through pattern;Decreased stride length;Wide base of support Gait velocity: decreased Gait velocity interpretation: Below normal speed for age/gender General Gait Details: ataxic like gt due to increased tremors; pt required min (A) to maintain balance and manage RW at times; very unsteady and high fall risk at this time; cues for sequencing          PT Diagnosis: Abnormality of gait;Generalized weakness  PT Problem List: Decreased strength;Decreased activity tolerance;Decreased balance;Decreased mobility;Decreased cognition;Decreased safety awareness PT Treatment Interventions: DME instruction;Stair training;Gait training;Functional mobility training;Therapeutic activities;Therapeutic exercise;Balance training;Neuromuscular re-education;Patient/family education     PT Goals(Current goals can be found in the care plan section) Acute Rehab PT Goals Patient Stated Goal: to not have the shakes PT Goal Formulation: With patient Time For Goal Achievement: 12/23/13 Potential to Achieve Goals: Good  Visit Information  Last PT Received On: 12/09/13 Assistance Needed: +1 History of Present Illness: 58 yo male h/o etoh abuse, CAD, polysubstance abuse, copd comes in for persistent stuttering and shaking in all four arms /legs that have been relieved with iv ativan in the ED.  Pt told EDP  that he ran out of his benzos a week ago, but he tells me 2  months ago.  He is still drinking etoh but not as heavily.       Prior [place] expects to be discharged to:: Private residence Living Arrangements: Alone Available Help at Discharge: Family;Friend(s);Available PRN/intermittently Type of Home: Mobile home Home Access: Stairs to enter Entrance Stairs-Number of Steps: 3-4 Entrance Stairs-Rails: Left;Right;Can reach both Home Layout: One level Home Equipment: None Additional Comments: pt reports his family and friends are in and out of house; reports his family brings him food daily  Prior Function Level of Independence: Independent Comments: pt independent but falls frequently; has family bring him food "almost everyday"  Communication Communication: HOH;Other (comment) (COLOSTOMY)    Cognition  Cognition Arousal/Alertness: Awake/alert Behavior During Therapy: Anxious;Impulsive Overall Cognitive Status: Impaired/Different from baseline Area of Impairment: Memory;Safety/judgement;Problem solving Memory: Decreased short-term memory Safety/Judgement: Decreased awareness of deficits;Decreased awareness of safety Problem Solving: Difficulty sequencing;Requires verbal cues;Requires tactile cues General Comments: pt impulsive with mobility; cues to slow down and focus on safe technique    Extremity/Trunk Assessment Upper Extremity Assessment Upper Extremity Assessment: Defer to OT evaluation Lower Extremity Assessment Lower Extremity Assessment: Generalized weakness (incr tremors with MMT) Cervical / Trunk Assessment Cervical / Trunk Assessment: Normal   Balance Balance Overall balance assessment: History of Falls;Needs assistance Sitting-balance support: No upper extremity supported;Feet supported Sitting balance-Leahy Scale: Fair Standing balance support: During functional activity;Bilateral upper extremity supported Standing balance-Leahy Scale: Poor Standing balance comment: requires bil UE  support on RW  General Comments General comments (skin integrity, edema, etc.): discussed D/C plan; pt would be willing to go to rehab if necessary   End of Session PT - End of Session Equipment Utilized During Treatment: Gait belt Activity Tolerance: Patient tolerated treatment well Patient left: in chair;with call bell/phone within reach;with chair alarm set Nurse Communication: Mobility status;Precautions  GP Functional Assessment Tool Used: clincial judgement  Functional Limitation: Mobility: Walking and moving around Mobility: Walking and Moving Around Current Status (B0962): At least 1 percent but less than 20 percent impaired, limited or restricted Mobility: Walking and Moving Around Goal Status [phone]): 0 percent impaired, limited or restricted   Gustavus Bryant, [place] [phone] 12/09/2013, 2:50 PM

## 2013-12-09 NOTE — Progress Notes (Signed)
INITIAL NUTRITION ASSESSMENT  DOCUMENTATION CODES Per approved criteria  -Underweight   INTERVENTION: Provide Ensure Complete BID Encourage PO intake Continue Multivitamin with minerals daily   NUTRITION DIAGNOSIS: Inadequate oral intake related to acute illness with nausea/vomiting as evidenced by pt's report and underweight BMI.    Goal: Pt to meet >/= 90% of their estimated nutrition needs   Monitor:  PO intake, weight trends, labs  Reason for Assessment: Malnutrition Screening Tool, score of 3  58 y.o. male  Admitting Dx: Benzodiazepine withdrawal  ASSESSMENT: 57 yo male h/o etoh abuse, CAD, polysubstance abuse, copd comes in for persistent stuttering and shaking in all four arms /legs that have been relieved with iv ativan in the ED.   Pt reports poor PO intake for 2-3 weeks PTA due to being sick, fatigued, and nauseous. Pt states his nausea and po intake have started to improve this past week. He reports good appetite today and desire to eat. Pt NPO at time of visit but, diet now advanced to Heart Healthy. No recent weight. Per bed scale pt's weight is 110 lbs but, question accuracy, due to pillows and blankets on bed. Encouraged PO intake and intake of nutritional supplements to promote weight gain and improves nutritional status.   Nutrition Focused Physical Exam:  Subcutaneous Fat:  Orbital Region: mild wasting Upper Arm Region: mild wasting Thoracic and Lumbar Region: moderate wasting  Muscle:  Temple Region: moderate wasting Clavicle Bone Region: moderate wasting Clavicle and Acromion Bone Region: mild wasting Scapular Bone Region: NA Dorsal Hand: wnl Patellar Region: severe wasting Anterior Thigh Region: moderate wasting Posterior Calf Region: moderate wasting  Edema: none   Height: Ht Readings from Last 1 Encounters:  11/26/13 5\' 7"  (1.702 m)    Weight: Wt Readings from Last 1 Encounters:  11/26/13 97 lb 10.6 oz (44.3 kg)    Ideal Body  Weight: 148 lbs  % Ideal Body Weight: 66%  Wt Readings from Last 10 Encounters:  11/26/13 97 lb 10.6 oz (44.3 kg)  08/27/13 108 lb 7.5 oz (49.2 kg)  08/27/13 108 lb 7.5 oz (49.2 kg)  03/25/13 107 lb 3.2 oz (48.626 kg)  02/23/13 107 lb 12.9 oz (48.9 kg)  01/15/13 115 lb (52.164 kg)  08/05/12 113 lb 12.8 oz (51.619 kg)  01/28/12 113 lb 6.4 oz (51.438 kg)  12/14/11 122 lb 5.7 oz (55.5 kg)  12/14/11 122 lb 5.7 oz (55.5 kg)    Usual Body Weight: 108 lbs  % Usual Body Weight: 90%  BMI:  There is no weight on file to calculate BMI. 15.2 kg/ (m^2) based on most recent weight (Underweight)  Estimated Nutritional Needs: Kcal: 1550-1770 Protein: 65-75 grams Fluid: 1.7 L/day  Skin: intact  Diet Order: Cardiac  EDUCATION NEEDS: -No education needs identified at this time  No intake or output data in the 24 hours ending 12/09/13 1026  Last BM: 2/18; colostomy   Labs:   Recent Labs Lab 12/08/13 2337 12/08/13 2349  NA 139 139  K 4.6 4.6  CL 97 98  CO2 29  --   BUN 11 10  CREATININE 0.93 1.00  CALCIUM 9.3  --   GLUCOSE 93 95    CBG (last 3)   Recent Labs  12/09/13 0052  GLUCAP 86    Scheduled Meds: . albuterol  2.5 mg Nebulization TID  . aspirin EC  81 mg Oral Daily  . atorvastatin  20 mg Oral QPM  . escitalopram  10 mg Oral Daily  .  folic acid  1 mg Oral Daily  . multivitamin with minerals  1 tablet Oral Daily  . pantoprazole  40 mg Oral Daily  . QUEtiapine  50 mg Oral q morning - 10a  . risperiDONE  0.5 mg Oral BID  . thiamine  100 mg Oral Daily   Or  . thiamine  100 mg Intravenous Daily  . tiotropium  18 mcg Inhalation Daily    Continuous Infusions:   Past Medical History  Diagnosis Date  . COPD (chronic obstructive pulmonary disease)   . History of pneumonia   . Hypertension     a. Normotensive 08/2013 off meds.  [name] HOH (hard of hearing)   . Protein calorie malnutrition   . ETOH abuse   . Tobacco abuse   . Diverticulitis     a. s/p colon  resection and colostomy  . Cardiac arrest     a. 01/2013, felt to be 2/2 severe COPD req trach;  b. 01/2013 Echo: EF 65-70% w/o rwma.  . Difficult intubation   . CAD (coronary artery disease)     a. Nonobstructive CAD by cath 08/2013 (mod RCA, mid LAD myocardial bridge) - ECG was concerning for inf STEMI but pt ruled out. CP possibly GERD.   [name] Hyponatremia     a. During 08/2013 felt due to polydipsia.  . Polysubstance abuse     a. Tobacco, marijuana, alcoholism.  [name] Anxiety   . Shortness of breath   . Peripheral vascular disease     Past Surgical History  Procedure Laterality Date  . Hernia repair      inguinal  . Colostomy  12/11/2011    Procedure: COLOSTOMY;  Surgeon: Rolm Bookbinder, MD;  Location: [facility];  Service: General;  Laterality: N/A;  . Colostomy revision  12/11/2011    Procedure: COLON RESECTION SIGMOID;  Surgeon: Rolm Bookbinder, MD;  Location: [facility];  Service: General;;  . Bowel resection  12/11/2011    Procedure: SMALL BOWEL RESECTION;  Surgeon: Rolm Bookbinder, MD;  Location: [facility];  Service: General;;  times 2  . Cystoscopy w/ ureteral stent placement  12/11/2011    Procedure: CYSTOSCOPY WITH STENT REPLACEMENT;  Surgeon: Malka So, MD;  Location: [facility];  Service: Urology;  Laterality: Left;  ureteral catheter placement  . Cardiac catheterization  08/25/2013  . Tracheostomy  01/2013    emergent d/t difficult intubation  . Colon surgery    . Tracheostomy closure      Pryor Ochoa RD, LDN Inpatient Clinical Dietitian Pager: [phone] After Hours Pager: [phone]

## 2013-12-09 NOTE — ED Notes (Signed)
Consulting MD at bedside

## 2013-12-09 NOTE — Progress Notes (Signed)
Patient admitted this AM by Dr. Shanon Brow- please see H&P.  Likely all problems are related to alcohol.  Needs PT eval and CIWA protocol.   Will hold on neuro consult at this time Spoke with daughter in law on phone  Zachary Bear DO

## 2013-12-09 NOTE — Progress Notes (Signed)
Called ED for report [phone], gave number to call back. Called ED back at [place]. Received report from [facility]. Room ready and awaiting arrival of pt.

## 2013-12-09 NOTE — Progress Notes (Signed)
UR completed 

## 2013-12-09 NOTE — ED Notes (Signed)
MD at bedside. 
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
## 2013-12-09 NOTE — ED Notes (Addendum)
MD at bedside. 

## 2013-12-10 MED ORDER — LORAZEPAM 0.5 MG PO TABS: 0.5000 mg | ORAL_TABLET | Freq: Three times a day (TID) | ORAL | Status: DC | PRN

## 2013-12-10 NOTE — Discharge Summary (Signed)
Physician Discharge Summary  Zachary Walls [id number] DOB: 03-13-1956 DOA: 12/08/2013  PCP: Adonis Brook, MD  Admit date: 12/08/2013 Discharge date: 12/10/2013  Discharge Diagnoses:  Principal Problem:   Benzodiazepine withdrawal Active Problems:   COPD (chronic obstructive pulmonary disease)   Polysubstance (excluding opioids) dependence   ETOH abuse   Shaking   Discharge Condition: stable  Filed Weights   12/10/13 0518  Weight: 48.2 kg (106 lb 4.2 oz)    History of present illness:  etoh abuse, CAD, polysubstance abuse, copd comes in for persistent stuttering and shaking in all four arms /legs that have been relieved with iv ativan in the ED. Pt told EDP that he ran out of his benzos a week ago, but he tells me 2 months ago. He is still drinking etoh but not as heavily, last drink earlier today. Denies fevers. No n/v/d. No pain. No sob. His shaking and stuttering have resolved at this time. Feels a little overall weak at this time. No slurred speech, no weakness focally. No swelling or edema anywhere.  Hospital Course:  Patient was admitted. Started on benzidiazepines, CIWA scores monitored. By discharge, feeling better, no tremulousness, alert, oriented and coopererative  Procedures:  none  Consultations:  none  Discharge Exam: Filed Vitals:   12/10/13 1328  BP: 119/69  Pulse: 72  Temp: 98.1 F (36.7 C)  Resp: 18    General: alert, oriented. Cardiovascular: rRR Respiratory: CTA  Discharge Instructions  Discharge Orders   Future Appointments Provider Department Dept Phone   12/30/2013 2:20 PM Rolm Bookbinder, MD [facility], [place] [phone]   Future Orders Complete By Expires   Diet general  As directed    Walker   As directed        Medication List         albuterol (2.5 MG/3ML) 0.083% nebulizer solution  Commonly known as:  PROVENTIL  Take 2.5 mg by nebulization 3 (three) times daily.     aspirin 81 MG EC tablet   Take 1 tablet (81 mg total) by mouth daily.     atorvastatin 20 MG tablet  Commonly known as:  LIPITOR  Take 1 tablet (20 mg total) by mouth every evening.     clotrimazole 1 % cream  Commonly known as:  LOTRIMIN  Apply to affected area 2 times daily     escitalopram 10 MG tablet  Commonly known as:  LEXAPRO  Take 1 tablet (10 mg total) by mouth daily.     folic acid 1 MG tablet  Commonly known as:  FOLVITE  Take 1 tablet (1 mg total) by mouth daily.     HYDROcodone-acetaminophen 5-325 MG per tablet  Commonly known as:  NORCO/VICODIN  Take 1 tablet by mouth every 6 (six) hours as needed for moderate pain or severe pain.     Ipratropium-Albuterol 20-100 MCG/ACT Aers respimat  Commonly known as:  COMBIVENT RESPIMAT  Inhale 2 puffs into the lungs 4 (four) times daily.     LORazepam 0.5 MG tablet  Commonly known as:  ATIVAN  Take 1 tablet (0.5 mg total) by mouth every 8 (eight) hours as needed (tremor).     multivitamin with minerals Tabs tablet  Take 1 tablet by mouth daily.     nitroGLYCERIN 0.4 MG SL tablet  Commonly known as:  NITROSTAT  Place 1 tablet (0.4 mg total) under the tongue every 5 (five) minutes as needed for chest pain (up to 3 doses).     omeprazole  20 MG capsule  Commonly known as:  PRILOSEC  Take 1 capsule (20 mg total) by mouth daily.     QUEtiapine 50 MG tablet  Commonly known as:  SEROQUEL  Take 1 tablet (50 mg total) by mouth every morning.     risperiDONE 0.5 MG tablet  Commonly known as:  RISPERDAL  Take 1 tablet (0.5 mg total) by mouth 2 (two) times daily.     tiotropium 18 MCG inhalation capsule  Commonly known as:  SPIRIVA HANDIHALER  Place 1 capsule (18 mcg total) into inhaler and inhale daily.       Allergies  Allergen Reactions  . Bee Venom Shortness Of Breath and Swelling  . Shellfish Allergy Anaphylaxis  . Codeine Itching and Rash       Follow-up Information   Follow up with Siy-Hian, Rod Can, MD In 1 week.    Specialty:  Internal Medicine   Contact information:   [address] [place] [phone]        The results of significant diagnostics from this hospitalization (including imaging, microbiology, ancillary and laboratory) are listed below for reference.    Significant Diagnostic Studies: Dg Chest 2 View  11/26/2013   CLINICAL DATA:  Headache.  EXAM: CHEST  2 VIEW  COMPARISON:  CT chest 05/15/2011 and PA and lateral chest 08/27/2013.  FINDINGS: Lungs are markedly emphysematous but clear. Heart size is normal. No pneumothorax or pleural effusion.  IMPRESSION: Severe emphysema without acute disease.   Electronically Signed   By: Inge Rise M.D.   On: 11/26/2013 00:52   Dg Abd 1 View  11/26/2013   CLINICAL DATA:  Nausea and vomiting.  EXAM: ABDOMEN - 1 VIEW  COMPARISON:  02/05/2013  FINDINGS: There is an ostomy in the left abdomen. No gas dilated small or large bowel. Bowel sutures noted in the pelvis. Hyperinflated lung bases are clear of edema or consolidation. No acute osseous findings. No abnormal intra-abdominal mass effect.  IMPRESSION: Nonobstructive bowel gas pattern.   Electronically Signed   By: Jorje Guild M.D.   On: 11/26/2013 06:04   Ct Head Wo Contrast  11/26/2013   CLINICAL DATA:  Altered vision.  EXAM: CT HEAD WITHOUT CONTRAST  TECHNIQUE: Contiguous axial images were obtained from the base of the skull through the vertex without intravenous contrast.  COMPARISON:  Head CT scan 08/10/2013.  Brain MRI 02/09/2013.  FINDINGS: Again seen is mild cortical atrophy. There is no evidence of acute abnormality including infarct, hemorrhage, mass lesion, mass effect, midline shift or abnormal extra-axial fluid collection. There is no hydrocephalus or pneumocephalus. The calvarium is intact. Small amount of left mastoid fluid is noted.  IMPRESSION: No acute finding.  Stable compared to prior exam.   Electronically Signed   By: Inge Rise M.D.   On: 11/26/2013 01:02   US  Abdomen Complete  11/26/2013   CLINICAL DATA:  Nausea and vomiting  EXAM: ULTRASOUND ABDOMEN COMPLETE  COMPARISON:  None.  FINDINGS: Gallbladder:  No gallstones or wall thickening visualized. No sonographic Murphy sign noted.  Common bile duct:  Diameter: 0.9 mm  Liver:  No focal lesion identified. Within normal limits in parenchymal echogenicity.  IVC:  No abnormality visualized.  Pancreas:  Visualized portion unremarkable.  Spleen:  Size and appearance within normal limits.  Right Kidney:  Length: 10.8 cm. Echogenicity within normal limits. No mass or hydronephrosis visualized.  Left Kidney:  Length: 10.3 cm. Echogenicity within normal limits. No mass or hydronephrosis visualized.  Abdominal aorta:  Atherosclerotic changes are noted without definitive aneurysmal dilatation.  Other findings:  None.  IMPRESSION: No acute abnormality noted.   Electronically Signed   By: Inez Catalina M.D.   On: 11/26/2013 10:08    Microbiology: No results found for this or any previous visit (from the past 240 hour(s)).   Labs: Basic Metabolic Panel:  Recent Labs Lab 12/08/13 2337 12/08/13 2349  NA 139 139  K 4.6 4.6  CL 97 98  CO2 29  --   GLUCOSE 93 95  BUN 11 10  CREATININE 0.93 1.00  CALCIUM 9.3  --    Liver Function Tests:  Recent Labs Lab 12/08/13 2337  AST 49*  ALT 42  ALKPHOS 99  BILITOT 0.4  PROT 7.5  ALBUMIN 3.7   No results found for this basename: LIPASE, AMYLASE,  in the last 168 hours No results found for this basename: AMMONIA,  in the last 168 hours CBC:  Recent Labs Lab 12/08/13 2337 12/08/13 2349  WBC 8.3  --   NEUTROABS 5.1  --   HGB 15.0 16.7  HCT 43.4 49.0  MCV 93.3  --   PLT 247  --    Cardiac Enzymes: No results found for this basename: CKTOTAL, CKMB, CKMBINDEX, TROPONINI,  in the last 168 hours BNP: BNP (last 3 results) No results found for this basename: PROBNP,  in the last 8760 hours CBG:  Recent Labs Lab 12/09/13 0052  GLUCAP 86        Signed:  [place] L  [facility] 12/10/2013, 3:55 PM

## 2013-12-10 NOTE — Progress Notes (Signed)
Per conversation with Olga Coaster RN, CM patient is agreeable to home health therapy and has chosen [facility].   [facility], who will make arrangements with patient at discharge.

## 2013-12-10 NOTE — Progress Notes (Signed)
Physical Therapy Treatment Patient Details Name: Zachary Walls MRN: [id number] DOB: 03-29-1956 Today's Date: 12/10/2013 Time: 0941-1006 PT Time Calculation (min): 25 min  PT Assessment / Plan / Recommendation  History of Present Illness 58 yo male h/o etoh abuse, CAD, polysubstance abuse, copd comes in for persistent stuttering and shaking in all four arms /legs that have been relieved with iv ativan in the ED.  Pt told EDP that he ran out of his benzos a week ago, but he tells me 2 months ago.  He is still drinking etoh but not as heavily.   PT Comments   Pt able to increase mobility today. Continues to ambulate with ataxic like gt. Pt unsteady with mobility due to gt abnormalities. Had lengthy discussion regarding D/C plan and recommendations. pt refusing SNF at this time. encouraged pt to D/C home with 24/7 (A) and ambulate with RW due to balance deficits and recent falls., pt verbalized understanding and agreead to ambulate with RW upon acute D/C. Will cont to follow per POC. Discharge plan updated.   Follow Up Recommendations  Home health PT;Supervision/Assistance - 24 hour     Does the patient have the potential to tolerate intense rehabilitation     Barriers to Discharge        Equipment Recommendations  Rolling walker with 5" wheels    Recommendations for Other Services    Frequency Min 3X/week   Progress towards PT Goals Progress towards PT goals: Progressing toward goals  Plan Discharge plan needs to be updated    Precautions / Restrictions Precautions Precautions: Fall Precaution Comments: pt reports mulitple falls at home; pt with incr tremors in Rt LE vs Lt with activity   Restrictions Weight Bearing Restrictions: No   Pertinent Vitals/Pain 6/10 "all over body" pt premedicated by RN per pt     Mobility  Bed Mobility Overal bed mobility: Modified Independent Bed Mobility: Sit to Supine Sit to supine: Modified independent (Device/Increase time) General bed mobility  comments: effortful and with tremors; requires incr time  Transfers Overall transfer level: Needs assistance Equipment used: Rolling walker (2 wheeled);Ambulation equipment used Transfers: Sit to/from Stand Sit to Stand: Min guard General transfer comment: min guard to steady and cues for hand placement and sequencing; Rt LE seems to buckle unexepectantly at times Ambulation/Gait Ambulation/Gait assistance: Min guard;Min assist Ambulation Distance (Feet): 200 Feet Assistive device: Rolling walker (2 wheeled) Gait Pattern/deviations: Ataxic;Decreased stride length;Step-through pattern;Narrow base of support Gait velocity: decreased  Gait velocity interpretation: Below normal speed for age/gender General Gait Details: pt min guard to min (A) for ambulation; requires (A) at times due to LOB posteriorly; LEs with incr tremors Rt LE > Lt LE; Rt LE seems to buckle at times making pt unsteady; cues for safety and encouraged to ambulate with RW Stairs: Yes Stairs assistance: Min guard Stair Management: Two rails;Step to pattern;Forwards Number of Stairs: 5 General stair comments: cues for sequencing and safety; min guard to steady; pt has difficulty leading with Rt LE due to increased tremors; cues to lead with Lt LE going up and Rt going down          PT Diagnosis:    PT Problem List:   PT Treatment Interventions:     PT Goals (current goals can now be found in the care plan section) Acute Rehab PT Goals Patient Stated Goal: to not have the shakes PT Goal Formulation: With patient Time For Goal Achievement: 12/23/13 Potential to Achieve Goals: Good  Visit Information  Last PT Received On: 12/10/13 Assistance Needed: +1 History of Present Illness: 58 yo male h/o etoh abuse, CAD, polysubstance abuse, copd comes in for persistent stuttering and shaking in all four arms /legs that have been relieved with iv ativan in the ED.  Pt told EDP that he ran out of his benzos a week ago, but he  tells me 2 months ago.  He is still drinking etoh but not as heavily.    Subjective Data  Subjective: Pt sitting in chair; agreeable to therapy.  Patient Stated Goal: to not have the shakes   Cognition  Cognition Arousal/Alertness: Awake/alert Behavior During Therapy: Anxious;Impulsive Overall Cognitive Status: Impaired/Different from baseline Area of Impairment: Safety/judgement;Problem solving Safety/Judgement: Decreased awareness of deficits;Decreased awareness of safety Problem Solving: Difficulty sequencing;Requires verbal cues;Requires tactile cues General Comments: pt seems unaware that he is unsteady with gt; believes he does not need AD for ambulating     Balance  Balance Overall balance assessment: Needs assistance;History of Falls Sitting-balance support: Feet supported;No upper extremity supported Sitting balance-Leahy Scale: Good Standing balance support: During functional activity;Bilateral upper extremity supported Standing balance-Leahy Scale: Fair Standing balance comment: can stand unsupported at S level General Comments General comments (skin integrity, edema, etc.): had lengthy discussion regarding D/C plan and recommendations. pt refusing SNF at this time. encouraged pt to D/C home with 24/7 (A) and ambulate with RW due to balance deficits and recent falls., pt verbalized understanding and agreead to ambulate with RW upon acute D/C.   End of Session PT - End of Session Equipment Utilized During Treatment: Gait belt Activity Tolerance: Patient tolerated treatment well Patient left: in bed;with call bell/phone within reach;with bed alarm set;with nursing/sitter in room Nurse Communication: Mobility status;Precautions   GP Functional Assessment Tool Used: clincial judgement  Functional Limitation: Mobility: Walking and moving around Mobility: Walking and Moving Around Current Status ([id number]): At least 1 percent but less than 20 percent impaired, limited or  restricted Mobility: Walking and Moving Around Goal Status [phone]): 0 percent impaired, limited or restricted   Gustavus Bryant, [place] [phone] 12/10/2013, 11:38 AM

## 2013-12-30 ENCOUNTER — Ambulatory Visit (INDEPENDENT_AMBULATORY_CARE_PROVIDER_SITE_OTHER): Payer: Medicaid Other | Admitting: General Surgery

## 2014-01-03 ENCOUNTER — Emergency Department: Payer: Self-pay | Admitting: Emergency Medicine

## 2014-01-11 ENCOUNTER — Ambulatory Visit (INDEPENDENT_AMBULATORY_CARE_PROVIDER_SITE_OTHER): Payer: Medicaid Other | Admitting: General Surgery

## 2014-01-11 ENCOUNTER — Encounter (INDEPENDENT_AMBULATORY_CARE_PROVIDER_SITE_OTHER): Payer: Self-pay | Admitting: General Surgery

## 2014-01-11 VITALS — BP 112/66 | HR 74 | Temp 98.8°F | Ht 64.0 in | Wt 112.0 lb

## 2014-01-11 DIAGNOSIS — K5732 Diverticulitis of large intestine without perforation or abscess without bleeding: Secondary | ICD-10-CM

## 2014-01-11 DIAGNOSIS — K5720 Diverticulitis of large intestine with perforation and abscess without bleeding: Secondary | ICD-10-CM

## 2014-01-11 NOTE — Progress Notes (Signed)
Subjective:     Patient ID: Zachary Walls, male   DOB: [birth year], 58 y.o.   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 He has not had takedown yet. He is overall not very healthy and has some sob with activity. He by report has had some passing out spells and has been getting evaluated by cardiology and neurology.  He has not smoked in one week. Otherwise his stoma is working fine without any problems. He wants to have takedown.   Review of Systems     Objective:   Physical Exam Abdomen is soft, nontender, functioning colostomy in place    Assessment:     Colostomy status post Hartmann's for diverticulitis     Plan:     His initial surgery was extraordinarily difficult. I discussed with him today that he would have to stop smoking for at least 6-8 weeks and maintain this before whenever he would consider a takedown. This is a very high-risk surgery in a high risk individual. I also told him I would need him to be evaluated by both his neurologist and his cardiologist prior to scheduling. He is going to call me back when all this has been done. I told him today that his colostomy certainly could be permanent and is not mandatory that we take it down. He also needs to increase enteral intake and protein.  He looks cachectic today.

## 2014-01-17 ENCOUNTER — Encounter (HOSPITAL_COMMUNITY): Payer: Self-pay | Admitting: Emergency Medicine

## 2014-01-17 ENCOUNTER — Other Ambulatory Visit: Payer: Self-pay | Admitting: Physician Assistant

## 2014-01-17 ENCOUNTER — Emergency Department (HOSPITAL_COMMUNITY): Payer: Medicaid Other

## 2014-01-17 ENCOUNTER — Inpatient Hospital Stay (HOSPITAL_COMMUNITY)
Admission: EM | Admit: 2014-01-17 | Discharge: 2014-01-20 | DRG: 896 | Disposition: A | Discharge status: Home / Self Care | Payer: Medicaid Other | Attending: Internal Medicine | Admitting: Internal Medicine

## 2014-01-17 DIAGNOSIS — I10 Essential (primary) hypertension: Secondary | ICD-10-CM

## 2014-01-17 DIAGNOSIS — F1010 Alcohol abuse, uncomplicated: Secondary | ICD-10-CM

## 2014-01-17 DIAGNOSIS — H9190 Unspecified hearing loss, unspecified ear: Secondary | ICD-10-CM

## 2014-01-17 DIAGNOSIS — Z933 Colostomy status: Secondary | ICD-10-CM

## 2014-01-17 DIAGNOSIS — R251 Tremor, unspecified: Secondary | ICD-10-CM

## 2014-01-17 DIAGNOSIS — Z9049 Acquired absence of other specified parts of digestive tract: Secondary | ICD-10-CM

## 2014-01-17 DIAGNOSIS — R079 Chest pain, unspecified: Secondary | ICD-10-CM

## 2014-01-17 DIAGNOSIS — F1920 Other psychoactive substance dependence, uncomplicated: Secondary | ICD-10-CM

## 2014-01-17 DIAGNOSIS — F411 Generalized anxiety disorder: Secondary | ICD-10-CM | POA: Diagnosis present

## 2014-01-17 DIAGNOSIS — Z8674 Personal history of sudden cardiac arrest: Secondary | ICD-10-CM

## 2014-01-17 DIAGNOSIS — F13239 Sedative, hypnotic or anxiolytic dependence with withdrawal, unspecified: Secondary | ICD-10-CM

## 2014-01-17 DIAGNOSIS — Z885 Allergy status to narcotic agent status: Secondary | ICD-10-CM

## 2014-01-17 DIAGNOSIS — J320 Chronic maxillary sinusitis: Secondary | ICD-10-CM | POA: Diagnosis present

## 2014-01-17 DIAGNOSIS — K5732 Diverticulitis of large intestine without perforation or abscess without bleeding: Secondary | ICD-10-CM | POA: Diagnosis present

## 2014-01-17 DIAGNOSIS — D72829 Elevated white blood cell count, unspecified: Secondary | ICD-10-CM | POA: Diagnosis present

## 2014-01-17 DIAGNOSIS — F13939 Sedative, hypnotic or anxiolytic use, unspecified with withdrawal, unspecified: Secondary | ICD-10-CM

## 2014-01-17 DIAGNOSIS — K7290 Hepatic failure, unspecified without coma: Secondary | ICD-10-CM | POA: Diagnosis present

## 2014-01-17 DIAGNOSIS — Z930 Tracheostomy status: Secondary | ICD-10-CM

## 2014-01-17 DIAGNOSIS — E43 Unspecified severe protein-calorie malnutrition: Secondary | ICD-10-CM

## 2014-01-17 DIAGNOSIS — F10939 Alcohol use, unspecified with withdrawal, unspecified: Principal | ICD-10-CM | POA: Diagnosis present

## 2014-01-17 DIAGNOSIS — K7682 Hepatic encephalopathy: Secondary | ICD-10-CM | POA: Diagnosis present

## 2014-01-17 DIAGNOSIS — G9340 Encephalopathy, unspecified: Secondary | ICD-10-CM

## 2014-01-17 DIAGNOSIS — E871 Hypo-osmolality and hyponatremia: Secondary | ICD-10-CM

## 2014-01-17 DIAGNOSIS — J9620 Acute and chronic respiratory failure, unspecified whether with hypoxia or hypercapnia: Secondary | ICD-10-CM

## 2014-01-17 DIAGNOSIS — E870 Hyperosmolality and hypernatremia: Secondary | ICD-10-CM

## 2014-01-17 DIAGNOSIS — R4182 Altered mental status, unspecified: Secondary | ICD-10-CM

## 2014-01-17 DIAGNOSIS — F1020 Alcohol dependence, uncomplicated: Secondary | ICD-10-CM | POA: Diagnosis present

## 2014-01-17 DIAGNOSIS — I739 Peripheral vascular disease, unspecified: Secondary | ICD-10-CM | POA: Diagnosis present

## 2014-01-17 DIAGNOSIS — J4489 Other specified chronic obstructive pulmonary disease: Secondary | ICD-10-CM | POA: Diagnosis present

## 2014-01-17 DIAGNOSIS — IMO0002 Reserved for concepts with insufficient information to code with codable children: Secondary | ICD-10-CM

## 2014-01-17 DIAGNOSIS — F17200 Nicotine dependence, unspecified, uncomplicated: Secondary | ICD-10-CM | POA: Diagnosis present

## 2014-01-17 DIAGNOSIS — Z931 Gastrostomy status: Secondary | ICD-10-CM

## 2014-01-17 DIAGNOSIS — Z91013 Allergy to seafood: Secondary | ICD-10-CM

## 2014-01-17 DIAGNOSIS — Z91038 Other insect allergy status: Secondary | ICD-10-CM

## 2014-01-17 DIAGNOSIS — G931 Anoxic brain damage, not elsewhere classified: Secondary | ICD-10-CM

## 2014-01-17 DIAGNOSIS — E162 Hypoglycemia, unspecified: Secondary | ICD-10-CM

## 2014-01-17 DIAGNOSIS — I469 Cardiac arrest, cause unspecified: Secondary | ICD-10-CM

## 2014-01-17 DIAGNOSIS — F10239 Alcohol dependence with withdrawal, unspecified: Principal | ICD-10-CM | POA: Diagnosis present

## 2014-01-17 DIAGNOSIS — I2510 Atherosclerotic heart disease of native coronary artery without angina pectoris: Secondary | ICD-10-CM

## 2014-01-17 DIAGNOSIS — J449 Chronic obstructive pulmonary disease, unspecified: Secondary | ICD-10-CM

## 2014-01-17 DIAGNOSIS — I959 Hypotension, unspecified: Secondary | ICD-10-CM | POA: Diagnosis present

## 2014-01-17 LAB — URINALYSIS, ROUTINE W REFLEX MICROSCOPIC
BILIRUBIN URINE: NEGATIVE
Glucose, UA: 100 mg/dL — AB
Hgb urine dipstick: NEGATIVE
KETONES UR: NEGATIVE mg/dL
LEUKOCYTES UA: NEGATIVE
Nitrite: NEGATIVE
PH: 6.5 (ref 5.0–8.0)
Protein, ur: NEGATIVE mg/dL
Specific Gravity, Urine: 1.018 (ref 1.005–1.030)
Urobilinogen, UA: 0.2 mg/dL (ref 0.0–1.0)

## 2014-01-17 LAB — LIPID PANEL
CHOL/HDL RATIO: 1.4 ratio
CHOLESTEROL: 130 mg/dL (ref 0–200)
HDL: 94 mg/dL (ref >39)
LDL Cholesterol: 25 mg/dL (ref 0–99)
TRIGLYCERIDES: 56 mg/dL (ref <150)
VLDL: 11 mg/dL (ref 0–40)

## 2014-01-17 LAB — CBC
HCT: 36.5 % — ABNORMAL LOW (ref 39.0–52.0)
HCT: 36 % — ABNORMAL LOW (ref 39.0–52.0)
HEMOGLOBIN: 13.3 g/dL (ref 13.0–17.0)
HEMOGLOBIN: 12.8 g/dL — AB (ref 13.0–17.0)
MCH: 32.6 pg (ref 26.0–34.0)
MCH: 32.4 pg (ref 26.0–34.0)
MCHC: 36.4 g/dL — ABNORMAL HIGH (ref 30.0–36.0)
MCHC: 35.6 g/dL (ref 30.0–36.0)
MCV: 89.5 fL (ref 78.0–100.0)
MCV: 91.1 fL (ref 78.0–100.0)
Platelets: 304 10*3/uL (ref 150–400)
Platelets: 276 10*3/uL (ref 150–400)
RBC: 4.08 MIL/uL — ABNORMAL LOW (ref 4.22–5.81)
RBC: 3.95 MIL/uL — ABNORMAL LOW (ref 4.22–5.81)
RDW: 13.1 % (ref 11.5–15.5)
RDW: 13.2 % (ref 11.5–15.5)
WBC: 11.2 10*3/uL — ABNORMAL HIGH (ref 4.0–10.5)
WBC: 8.2 10*3/uL (ref 4.0–10.5)

## 2014-01-17 LAB — CREATININE, SERUM
CREATININE: 0.77 mg/dL (ref 0.50–1.35)
GFR calc Af Amer: >90 mL/min (ref >90)
GFR calc non Af Amer: >90 mL/min (ref >90)

## 2014-01-17 LAB — COMPREHENSIVE METABOLIC PANEL
ALBUMIN: 3.6 g/dL (ref 3.5–5.2)
ALT: 14 U/L (ref 0–53)
AST: 21 U/L (ref 0–37)
Alkaline Phosphatase: 69 U/L (ref 39–117)
BUN: 14 mg/dL (ref 6–23)
CALCIUM: 9.5 mg/dL (ref 8.4–10.5)
CO2: 29 mEq/L (ref 19–32)
CREATININE: 0.83 mg/dL (ref 0.50–1.35)
Chloride: 87 mEq/L — ABNORMAL LOW (ref 96–112)
GFR calc Af Amer: >90 mL/min (ref >90)
GFR calc non Af Amer: >90 mL/min (ref >90)
Glucose, Bld: 99 mg/dL (ref 70–99)
Potassium: 4 mEq/L (ref 3.7–5.3)
Sodium: 129 mEq/L — ABNORMAL LOW (ref 137–147)
TOTAL PROTEIN: 6.9 g/dL (ref 6.0–8.3)
Total Bilirubin: 0.2 mg/dL — ABNORMAL LOW (ref 0.3–1.2)

## 2014-01-17 LAB — RAPID URINE DRUG SCREEN, HOSP PERFORMED
AMPHETAMINES: NOT DETECTED
BENZODIAZEPINES: NOT DETECTED
Barbiturates: NOT DETECTED
Cocaine: NOT DETECTED
Opiates: NOT DETECTED
Tetrahydrocannabinol: POSITIVE — AB

## 2014-01-17 LAB — TSH: TSH: 0.517 u[IU]/mL (ref 0.350–4.500)

## 2014-01-17 LAB — CBG MONITORING, ED: GLUCOSE-CAPILLARY: 95 mg/dL (ref 70–99)

## 2014-01-17 LAB — AMMONIA: Ammonia: 41 umol/L (ref 11–60)

## 2014-01-17 LAB — TROPONIN I: Troponin I: <0.3 ng/mL (ref <0.30)

## 2014-01-17 LAB — ETHANOL

## 2014-01-17 LAB — HEMOGLOBIN A1C
Hgb A1c MFr Bld: 5.4 % (ref <5.7)
Mean Plasma Glucose: 108 mg/dL (ref <117)

## 2014-01-17 LAB — I-STAT TROPONIN, ED: Troponin i, poc: 0 ng/mL (ref 0.00–0.08)

## 2014-01-17 MED ORDER — RISPERIDONE 0.5 MG PO TABS
0.5000 mg | ORAL_TABLET | Freq: Two times a day (BID) | ORAL | Status: DC
  Administered 2014-01-18 – 2014-01-20 (×5): 0.5 mg via ORAL
  Filled 2014-01-17 (×7): qty 1

## 2014-01-17 MED ORDER — IPRATROPIUM-ALBUTEROL 0.5-2.5 (3) MG/3ML IN SOLN
3.0000 mL | Freq: Four times a day (QID) | RESPIRATORY_TRACT | Status: DC
  Administered 2014-01-17: 3 mL via RESPIRATORY_TRACT
  Filled 2014-01-17: qty 3

## 2014-01-17 MED ORDER — ADULT MULTIVITAMIN W/MINERALS CH
1.0000 | ORAL_TABLET | Freq: Every day | ORAL | Status: DC
  Administered 2014-01-18 – 2014-01-20 (×3): 1 via ORAL
  Filled 2014-01-17 (×3): qty 1

## 2014-01-17 MED ORDER — THIAMINE HCL 100 MG/ML IJ SOLN
100.0000 mg | Freq: Once | INTRAMUSCULAR | Status: AC
  Administered 2014-01-17: 100 mg via INTRAVENOUS
  Filled 2014-01-17: qty 2

## 2014-01-17 MED ORDER — LORAZEPAM 2 MG/ML IJ SOLN
0.0000 mg | Freq: Four times a day (QID) | INTRAMUSCULAR | Status: AC
  Administered 2014-01-18: 1 mg via INTRAVENOUS
  Administered 2014-01-19 (×2): 2 mg via INTRAVENOUS
  Filled 2014-01-17 (×4): qty 1

## 2014-01-17 MED ORDER — ASPIRIN EC 81 MG PO TBEC
81.0000 mg | DELAYED_RELEASE_TABLET | Freq: Every day | ORAL | Status: DC
  Administered 2014-01-18 – 2014-01-20 (×3): 81 mg via ORAL
  Filled 2014-01-17 (×4): qty 1

## 2014-01-17 MED ORDER — ALBUTEROL SULFATE (2.5 MG/3ML) 0.083% IN NEBU
2.5000 mg | INHALATION_SOLUTION | RESPIRATORY_TRACT | Status: DC | PRN
  Administered 2014-01-19 – 2014-01-20 (×4): 2.5 mg via RESPIRATORY_TRACT
  Filled 2014-01-17 (×5): qty 3

## 2014-01-17 MED ORDER — TIOTROPIUM BROMIDE MONOHYDRATE 18 MCG IN CAPS
18.0000 ug | ORAL_CAPSULE | Freq: Every day | RESPIRATORY_TRACT | Status: DC
  Filled 2014-01-17: qty 5

## 2014-01-17 MED ORDER — QUETIAPINE FUMARATE 50 MG PO TABS
50.0000 mg | ORAL_TABLET | Freq: Every morning | ORAL | Status: DC
  Administered 2014-01-18 – 2014-01-20 (×3): 50 mg via ORAL
  Filled 2014-01-17 (×3): qty 1

## 2014-01-17 MED ORDER — PANTOPRAZOLE SODIUM 40 MG PO TBEC
40.0000 mg | DELAYED_RELEASE_TABLET | Freq: Every day | ORAL | Status: DC
  Administered 2014-01-18 – 2014-01-20 (×3): 40 mg via ORAL
  Filled 2014-01-17 (×3): qty 1

## 2014-01-17 MED ORDER — ADULT MULTIVITAMIN W/MINERALS CH: 1.0000 | ORAL_TABLET | Freq: Every day | ORAL | Status: DC

## 2014-01-17 MED ORDER — LORAZEPAM 1 MG PO TABS: 1.0000 mg | ORAL_TABLET | Freq: Four times a day (QID) | ORAL | Status: DC | PRN

## 2014-01-17 MED ORDER — THIAMINE HCL 100 MG/ML IJ SOLN
100.0000 mg | Freq: Every day | INTRAMUSCULAR | Status: DC
  Filled 2014-01-17 (×4): qty 1

## 2014-01-17 MED ORDER — NITROGLYCERIN 0.4 MG SL SUBL: 0.4000 mg | SUBLINGUAL_TABLET | SUBLINGUAL | Status: DC | PRN

## 2014-01-17 MED ORDER — AMOXICILLIN-POT CLAVULANATE 875-125 MG PO TABS
1.0000 | ORAL_TABLET | Freq: Two times a day (BID) | ORAL | Status: DC
  Administered 2014-01-18 – 2014-01-20 (×5): 1 via ORAL
  Filled 2014-01-17 (×7): qty 1

## 2014-01-17 MED ORDER — LORAZEPAM 2 MG/ML IJ SOLN
0.0000 mg | Freq: Two times a day (BID) | INTRAMUSCULAR | Status: DC
  Administered 2014-01-19 – 2014-01-20 (×2): 2 mg via INTRAVENOUS
  Filled 2014-01-17 (×2): qty 1

## 2014-01-17 MED ORDER — HEPARIN SODIUM (PORCINE) 5000 UNIT/ML IJ SOLN
5000.0000 [IU] | Freq: Three times a day (TID) | INTRAMUSCULAR | Status: DC
  Administered 2014-01-18 – 2014-01-20 (×9): 5000 [IU] via SUBCUTANEOUS
  Filled 2014-01-17 (×11): qty 1

## 2014-01-17 MED ORDER — LORAZEPAM 2 MG/ML IJ SOLN
1.0000 mg | INTRAMUSCULAR | Status: AC
  Administered 2014-01-17 (×4): 1 mg via INTRAVENOUS
  Filled 2014-01-17 (×3): qty 1

## 2014-01-17 MED ORDER — SODIUM CHLORIDE 0.9 % IV SOLN
INTRAVENOUS | Status: DC
  Administered 2014-01-17 – 2014-01-20 (×5): via INTRAVENOUS

## 2014-01-17 MED ORDER — VITAMIN B-1 100 MG PO TABS
100.0000 mg | ORAL_TABLET | Freq: Every day | ORAL | Status: DC
  Administered 2014-01-18 – 2014-01-20 (×3): 100 mg via ORAL
  Filled 2014-01-17 (×3): qty 1

## 2014-01-17 MED ORDER — IPRATROPIUM-ALBUTEROL 0.5-2.5 (3) MG/3ML IN SOLN
3.0000 mL | Freq: Three times a day (TID) | RESPIRATORY_TRACT | Status: DC
  Administered 2014-01-18 – 2014-01-19 (×3): 3 mL via RESPIRATORY_TRACT
  Filled 2014-01-17 (×4): qty 3

## 2014-01-17 MED ORDER — ESCITALOPRAM OXALATE 10 MG PO TABS
10.0000 mg | ORAL_TABLET | Freq: Every day | ORAL | Status: DC
  Administered 2014-01-18 – 2014-01-20 (×3): 10 mg via ORAL
  Filled 2014-01-17 (×3): qty 1

## 2014-01-17 MED ORDER — LORAZEPAM 2 MG/ML IJ SOLN
1.0000 mg | Freq: Four times a day (QID) | INTRAMUSCULAR | Status: DC | PRN
  Administered 2014-01-18: 1 mg via INTRAVENOUS

## 2014-01-17 MED ORDER — CLOTRIMAZOLE 1 % EX CREA
TOPICAL_CREAM | Freq: Two times a day (BID) | CUTANEOUS | Status: DC
  Administered 2014-01-18 – 2014-01-20 (×5): via TOPICAL
  Filled 2014-01-17: qty 15

## 2014-01-17 MED ORDER — ALBUTEROL SULFATE (2.5 MG/3ML) 0.083% IN NEBU: 2.5000 mg | INHALATION_SOLUTION | Freq: Three times a day (TID) | RESPIRATORY_TRACT | Status: DC

## 2014-01-17 MED ORDER — FOLIC ACID 1 MG PO TABS
1.0000 mg | ORAL_TABLET | Freq: Every day | ORAL | Status: DC
  Administered 2014-01-18 – 2014-01-20 (×3): 1 mg via ORAL
  Filled 2014-01-17 (×3): qty 1

## 2014-01-17 NOTE — ED Notes (Signed)
Md Beaton at bedside. 

## 2014-01-17 NOTE — ED Notes (Addendum)
Pt resting in bed; repeating words that he is seeing. Ex: "room 36. Room 36. Nurse, nurse. Eyes, eyes." When asked where he is pt states "emergency room" , but when asked year pt states "gurney, gurney, gurney." Pt has echolalia and flight of ideas. Pt denies pain. Follows some commands. Pupils dilated at 64mm. Pt not answering if he has had any medications/etoh today. Speech clear. VSS. Neuro intact.

## 2014-01-17 NOTE — ED Provider Notes (Signed)
CSN: [id number]     Arrival date & time 01/17/14  1611 History   First MD Initiated Contact with Patient 01/17/14 1615     Chief Complaint  Patient presents with  . Altered Mental Status     Patient is a 58 y.o. male presenting with altered mental status. The history is provided by the patient and the EMS personnel. The history is limited by the condition of the patient.  Altered Mental Status Severity:  Unable to specify Episode history:  Unable to specify Timing:  Constant  Per EMS: Pt from home, family found this AM with AMS. LSN on Thursday. Pt is alert, repeating words; follows some commands. Pt reports chest pain with SOB this AM; took 2 nitro and given 325 aspirin. Denies pain at this time. 114/78. 79 NSR. 98% RA. CBG 106.      Patient does admit to drinking alcohol whenever he can get access to it.  Patient does have previous medical history of alcohol abuse. He is unable to quantify when the last drink he had.  Past Medical History  Diagnosis Date  . COPD (chronic obstructive pulmonary disease)   . History of pneumonia   . Hypertension     a. Normotensive 08/2013 off meds.  [name] HOH (hard of hearing)   . Protein calorie malnutrition   . ETOH abuse   . Tobacco abuse   . Diverticulitis     a. s/p colon resection and colostomy  . Cardiac arrest     a. 01/2013, felt to be 2/2 severe COPD req trach;  b. 01/2013 Echo: EF 65-70% w/o rwma.  . Difficult intubation   . CAD (coronary artery disease)     a. Nonobstructive CAD by cath 08/2013 (mod RCA, mid LAD myocardial bridge) - ECG was concerning for inf STEMI but pt ruled out. CP possibly GERD.   [name] Hyponatremia     a. During 08/2013 felt due to polydipsia.  . Polysubstance abuse     a. Tobacco, marijuana, alcoholism.  [name] Anxiety   . Shortness of breath   . Peripheral vascular disease    Past Surgical History  Procedure Laterality Date  . Hernia repair      inguinal  . Colostomy  12/11/2011    Procedure: COLOSTOMY;  Surgeon:  Rolm Bookbinder, MD;  Location: [facility];  Service: General;  Laterality: N/A;  . Colostomy revision  12/11/2011    Procedure: COLON RESECTION SIGMOID;  Surgeon: Rolm Bookbinder, MD;  Location: [facility];  Service: General;;  . Bowel resection  12/11/2011    Procedure: SMALL BOWEL RESECTION;  Surgeon: Rolm Bookbinder, MD;  Location: [facility];  Service: General;;  times 2  . Cystoscopy w/ ureteral stent placement  12/11/2011    Procedure: CYSTOSCOPY WITH STENT REPLACEMENT;  Surgeon: Malka So, MD;  Location: [facility];  Service: Urology;  Laterality: Left;  ureteral catheter placement  . Cardiac catheterization  08/25/2013  . Tracheostomy  01/2013    emergent d/t difficult intubation  . Colon surgery    . Tracheostomy closure     Family History  Problem Relation Age of Onset  . Heart attack Father   . Cancer Father     unknown type  . HIV Brother    History  Substance Use Topics  . Smoking status: Current Every Day Smoker -- 0.50 packs/day for 44 years    Types: Cigarettes    Last Attempt to Quit: 11/03/2011  . Smokeless tobacco: Never Used  Comment: Has smoked as much as 2-3 packs/day.  Now can make a pack last a week.  . Alcohol Use: Yes     Comment: Previously drank heavily.  Admits to drinking 1-3 40 oz beers most days of the week.    Review of Systems  Level V caveat  Allergies  Bee venom; Shellfish allergy; and Codeine  Home Medications   Current Outpatient Rx  Name  Route  Sig  Dispense  Refill  . albuterol (PROVENTIL) (2.5 MG/3ML) 0.083% nebulizer solution   Nebulization   Take 2.5 mg by nebulization 3 (three) times daily.         [name] albuterol-ipratropium (COMBIVENT) 18-103 MCG/ACT inhaler   Inhalation   Inhale 2 puffs into the lungs every 4 (four) hours as needed for wheezing or shortness of breath.         [name] atorvastatin (LIPITOR) 20 MG tablet   Oral   Take 1 tablet (20 mg total) by mouth every evening.   30 tablet   3   . clotrimazole (LOTRIMIN) 1 %  cream      Apply to affected area 2 times daily   15 g   0   . escitalopram (LEXAPRO) 10 MG tablet   Oral   Take 1 tablet (10 mg total) by mouth daily.   30 tablet   1   . folic acid (FOLVITE) 1 MG tablet   Oral   Take 1 tablet (1 mg total) by mouth daily.   30 tablet   3   . nitroGLYCERIN (NITROSTAT) 0.4 MG SL tablet   Sublingual   Place 1 tablet (0.4 mg total) under the tongue every 5 (five) minutes as needed for chest pain (up to 3 doses).   25 tablet   2   . omeprazole (PRILOSEC) 20 MG capsule   Oral   Take 1 capsule (20 mg total) by mouth daily.   30 capsule   2   . QUEtiapine (SEROQUEL) 50 MG tablet   Oral   Take 1 tablet (50 mg total) by mouth every morning.   30 tablet   1   . risperiDONE (RISPERDAL) 0.5 MG tablet   Oral   Take 1 tablet (0.5 mg total) by mouth 2 (two) times daily.   30 tablet   1   . tiotropium (SPIRIVA) 18 MCG inhalation capsule   Inhalation   Place 18 mcg into inhaler and inhale daily.         [name] amoxicillin-clavulanate (AUGMENTIN) 875-125 MG per tablet   Oral   Take 1 tablet by mouth 2 (two) times daily.   10 tablet   0   . chlordiazePOXIDE (LIBRIUM) 5 MG capsule      Please dispense 18 pills - Take 1 pill three times a day for 3 days, then Take 1 pill two times a day for 3 days, then Take 1 pill once a day for 3 days and stop.   18 capsule   0   . losartan (COZAAR) 50 MG tablet   Oral   Take 1 tablet (50 mg total) by mouth daily.   30 tablet   0   . metoprolol tartrate (LOPRESSOR) 12.5 mg TABS tablet   Oral   Take 0.5 tablets (12.5 mg total) by mouth 2 (two) times daily.   30 tablet   0   . thiamine 100 MG tablet   Oral   Take 1 tablet (100 mg total) by mouth daily.   [place]  tablet   0    BP 138/74  Pulse 77  Temp(Src) 98.9 F (37.2 C) (Oral)  Resp 20  Ht 5\' 4"  (1.626 m)  Wt 121 lb 11.2 oz (55.203 kg)  BMI 20.88 kg/m2  SpO2 99% Physical Exam  Nursing note and vitals reviewed. Constitutional: He appears  well-developed and well-nourished. No distress.  HENT:  Head: Normocephalic and atraumatic.  Eyes: Pupils are equal, round, and reactive to light.  Neck: Normal range of motion.  Cardiovascular: Normal rate and intact distal pulses.   Pulmonary/Chest: No respiratory distress.  Abdominal: Normal appearance. He exhibits no distension.  Musculoskeletal: Normal range of motion.  Neurological: He is alert. No cranial nerve deficit or sensory deficit. GCS eye subscore is 4. GCS verbal subscore is 4. GCS motor subscore is 6.  Skin: Skin is warm and dry. No rash noted.  Psychiatric: He has a normal mood and affect. His behavior is normal.    ED Course  Procedures (including critical care time)  Medications  LORazepam (ATIVAN) injection 1 mg (1 mg Intravenous Given 01/17/14 1744)  LORazepam (ATIVAN) injection 0-4 mg (2 mg Intravenous Given 01/19/14 1429)  thiamine (B-1) injection 100 mg (100 mg Intravenous Given 01/17/14 1714)  sodium chloride 0.9 % bolus 500 mL (500 mLs Intravenous Given 01/18/14 0540)  sodium chloride 0.9 % bolus 500 mL (500 mLs Intravenous Given 01/19/14 0740)    Labs Review Labs Reviewed  COMPREHENSIVE METABOLIC PANEL - Abnormal; Notable for the following:    Sodium 129 (*)    Chloride 87 (*)    Total Bilirubin 0.2 (*)    All other components within normal limits  CBC - Abnormal; Notable for the following:    WBC 11.2 (*)    RBC 4.08 (*)    HCT 36.5 (*)    MCHC 36.4 (*)    All other components within normal limits  URINALYSIS, ROUTINE W REFLEX MICROSCOPIC - Abnormal; Notable for the following:    Glucose, UA 100 (*)    All other components within normal limits  URINE RAPID DRUG SCREEN (HOSP PERFORMED) - Abnormal; Notable for the following:    Tetrahydrocannabinol POSITIVE (*)    All other components within normal limits  CBC - Abnormal; Notable for the following:    RBC 3.95 (*)    Hemoglobin 12.8 (*)    HCT 36.0 (*)    All other components within normal limits   COMPREHENSIVE METABOLIC PANEL - Abnormal; Notable for the following:    Sodium 130 (*)    Chloride 93 (*)    Total Protein 5.5 (*)    Albumin 2.9 (*)    All other components within normal limits  CBC WITH DIFFERENTIAL - Abnormal; Notable for the following:    RBC 3.76 (*)    Hemoglobin 12.3 (*)    HCT 34.2 (*)    All other components within normal limits  OSMOLALITY - Abnormal; Notable for the following:    Osmolality 268 (*)    All other components within normal limits  COMPREHENSIVE METABOLIC PANEL - Abnormal; Notable for the following:    Sodium 135 (*)    Total Protein 5.6 (*)    Albumin 2.7 (*)    Total Bilirubin <0.2 (*)    All other components within normal limits  CBC WITH DIFFERENTIAL - Abnormal; Notable for the following:    RBC 3.54 (*)    Hemoglobin 11.5 (*)    HCT 32.6 (*)    All other components within  normal limits  COMPREHENSIVE METABOLIC PANEL - Abnormal; Notable for the following:    Glucose, Bld 100 (*)    BUN 5 (*)    Total Protein 5.6 (*)    Albumin 2.7 (*)    Total Bilirubin <0.2 (*)    All other components within normal limits  CBC WITH DIFFERENTIAL - Abnormal; Notable for the following:    RBC 3.59 (*)    Hemoglobin 11.5 (*)    HCT 33.4 (*)    All other components within normal limits  URINE CULTURE  ETHANOL  AMMONIA  CREATININE, SERUM  TSH  HEMOGLOBIN A1C  LIPID PANEL  TROPONIN I  TROPONIN I  TROPONIN I  I-STAT TROPOININ, ED  CBG MONITORING, ED   Imaging Review Ct Head Wo Contrast  01/17/2014 CLINICAL DATA: Altered mental status. Head pain. EXAM: CT HEAD WITHOUT CONTRAST TECHNIQUE: Contiguous axial images were obtained from the base of the skull through the vertex without intravenous contrast. COMPARISON: 11/26/2013 FINDINGS: Chronic right greater the left maxillary sinusitis. The brainstem, cerebellum, cerebral peduncles, thalamus, basal ganglia, basilar cisterns, and ventricular system appear within normal limits. No intracranial  hemorrhage, mass lesion, or acute CVA. IMPRESSION: 1. Chronic maxillary sinusitis. No acute intracranial abnormality. Electronically Signed By: Sherryl Barters M.D. On: 01/17/2014 18:05  Dg Chest Portable 1 View  01/17/2014 CLINICAL DATA: Cough. Smoker. EXAM: PORTABLE CHEST - 1 VIEW COMPARISON: 11/26/2013. FINDINGS: The heart remains normal in size. The lungs remain hyperexpanded with mild central peribronchial thickening. Unremarkable bones. IMPRESSION: No acute abnormality. Stable changes of COPD. Electronically Signed By: Enrique Sack M.D. On: 01/17/2014 17:11    EKG Interpretation Sinus rhythm Rate = 73 Biatrial enlargement Borderline right axis deviation Abnormal R-wave progression, late transition Nonspecific T abnrm, anterolateral leads Nonspecific ST elevation, inferior leads      MDM         Dot Lanes, MD 01/25/14 [id number]

## 2014-01-17 NOTE — ED Notes (Signed)
PT now sleeping' 96% RA. Chest visibly rising and falling.

## 2014-01-17 NOTE — ED Notes (Signed)
Pt returned from CT °

## 2014-01-17 NOTE — ED Notes (Signed)
Admitting MD at bedside.

## 2014-01-17 NOTE — ED Notes (Signed)
PT remains drowsy, but awakened with ammonia tablet. Pt denies any complaint. Pupils remain dilated.

## 2014-01-17 NOTE — ED Notes (Signed)
Pt no longer speaking constantly; resting in bed. Sentences now complete. VSS.

## 2014-01-17 NOTE — ED Notes (Signed)
Per EMS: Pt from home, family found this AM with AMS. LSN on Thursday. Pt is alert, repeating words; follows some commands. Pt reports chest pain with SOB this AM; took 2 nitro and given 325 aspirin. Denies pain at this time. 114/78. 79 NSR. 98% RA. CBG 106.

## 2014-01-17 NOTE — ED Notes (Signed)
MD made aware of pt's BP; maintenance fluids hung.

## 2014-01-17 NOTE — H&P (Addendum)
Hospitalist Admission History and Physical  Patient name: Zachary Walls record number: [id number] Date of birth: February 07, 1956 Age: 58 y.o. Gender: male  Primary Care Provider: [name], NP  Chief Complaint: encephalopathy  History of Present Illness:This is a 58 y.o. year old male with multiple medical problems including sever ETOH abuse, polysubstance dependence, CAD, COPD, diverticulitis s/p bowel resection and colostomy presenting with encephalopathy. Level V caveat as pt is somnolent, confused and hard of hearing. Per report, pt was found by family acutely confused earlier today. + ROS EMS at the time was SOB and CP. Was given NTG and full dose ASA. Pt noted to bave been admitted 2/17-2/19 for generalized tremor 2/2 benzo withdrawal. No reported/documented missed benzo doses.  On presentation to ER, hemodynamically stable. Noted WBC @ 11.2. Na @ 129. Head CT with chronic maxillary sinusitis, otherwise WNL. UA WNL. CXR negative for focal infiltrate, though with stable COPD changes. ETOH level <11. UDS and ammonia level pending. Given 4mg  IV ativan in ER.   Patient Active Problem List   Diagnosis Date Noted  . Encephalopathy 01/17/2014  . Benzodiazepine withdrawal 12/09/2013  . ETOH abuse 12/09/2013  . Shaking 12/09/2013  . Hypoglycemia 11/26/2013  . Hyposmolality and/or hyponatremia 08/27/2013  . Polysubstance (excluding opioids) dependence 08/27/2013  . CAD (coronary artery disease) 08/27/2013  . Chest pain 08/25/2013  . History of gastrostomy tube placement 04/10/2013  . Hypernatremia 02/22/2013  . Tracheostomy status 02/16/2013  . Anoxic encephalopathy 02/09/2013  . Respiratory failure, acute and chronic 02/09/2013  . Cardiac arrest 02/09/2013  . Altered mental status 02/05/2013  . Severe protein-calorie malnutrition 12/21/2011  . HOH (hard of hearing) 12/01/2011  . COPD (chronic obstructive pulmonary disease)   . Hypertension    Past Medical History: Past  Medical History  Diagnosis Date  . COPD (chronic obstructive pulmonary disease)   . History of pneumonia   . Hypertension     a. Normotensive 08/2013 off meds.  [name] HOH (hard of hearing)   . Protein calorie malnutrition   . ETOH abuse   . Tobacco abuse   . Diverticulitis     a. s/p colon resection and colostomy  . Cardiac arrest     a. 01/2013, felt to be 2/2 severe COPD req trach;  b. 01/2013 Echo: EF 65-70% w/o rwma.  . Difficult intubation   . CAD (coronary artery disease)     a. Nonobstructive CAD by cath 08/2013 (mod RCA, mid LAD myocardial bridge) - ECG was concerning for inf STEMI but pt ruled out. CP possibly GERD.   [name] Hyponatremia     a. During 08/2013 felt due to polydipsia.  . Polysubstance abuse     a. Tobacco, marijuana, alcoholism.  [name] Anxiety   . Shortness of breath   . Peripheral vascular disease     Past Surgical History: Past Surgical History  Procedure Laterality Date  . Hernia repair      inguinal  . Colostomy  12/11/2011    Procedure: COLOSTOMY;  Surgeon: Rolm Bookbinder, MD;  Location: [facility];  Service: General;  Laterality: N/A;  . Colostomy revision  12/11/2011    Procedure: COLON RESECTION SIGMOID;  Surgeon: Rolm Bookbinder, MD;  Location: [facility];  Service: General;;  . Bowel resection  12/11/2011    Procedure: SMALL BOWEL RESECTION;  Surgeon: Rolm Bookbinder, MD;  Location: [facility];  Service: General;;  times 2  . Cystoscopy w/ ureteral stent placement  12/11/2011    Procedure: CYSTOSCOPY WITH STENT  REPLACEMENT;  Surgeon: Malka So, MD;  Location: [facility];  Service: Urology;  Laterality: Left;  ureteral catheter placement  . Cardiac catheterization  08/25/2013  . Tracheostomy  01/2013    emergent d/t difficult intubation  . Colon surgery    . Tracheostomy closure      Social History: History   Social History  . Marital Status: Divorced    Spouse Name: N/A    Number of Children: 2  . Years of Education: N/A   Occupational History  .  Contractor     Social History Main Topics  . Smoking status: Current Every Day Smoker -- 0.50 packs/day for 44 years    Types: Cigarettes    Last Attempt to Quit: 11/03/2011  . Smokeless tobacco: Never Used     Comment: Has smoked as much as 2-3 packs/day.  Now can make a pack last a week.  . Alcohol Use: Yes     Comment: Previously drank heavily.  Admits to drinking 1-3 40 oz beers most days of the week.  . Drug Use: Yes    Special: Marijuana     Comment: Smokes Marijuana "when I can get it," (most days of the week).  . Sexual Activity: Not Currently   Other Topics Concern  . None   Social History Narrative   Lives in [place] by himself.  He does not work.    Family History: Family History  Problem Relation Age of Onset  . Heart attack Father   . Cancer Father     unknown type  . HIV Brother     Allergies: Allergies  Allergen Reactions  . Bee Venom Shortness Of Breath and Swelling  . Shellfish Allergy Anaphylaxis  . Codeine Itching and Rash    Current Facility-Administered Medications  Medication Dose Route Frequency Provider Last Rate Last Dose  . 0.9 %  sodium chloride infusion   Intravenous Continuous Shanda Howells, MD      . albuterol (PROVENTIL) (2.5 MG/3ML) 0.083% nebulizer solution 2.5 mg  2.5 mg Nebulization TID Shanda Howells, MD      . aspirin EC tablet 81 mg  81 mg Oral Daily Shanda Howells, MD      . clotrimazole (LOTRIMIN) 1 % cream   Topical BID Shanda Howells, MD      . escitalopram (LEXAPRO) tablet 10 mg  10 mg Oral Daily Shanda Howells, MD      . folic acid (FOLVITE) tablet 1 mg  1 mg Oral Daily Shanda Howells, MD      . heparin injection 5,000 Units  5,000 Units Subcutaneous 3 times per day Shanda Howells, MD      . Ipratropium-Albuterol (COMBIVENT) respimat 2 puff  2 puff Inhalation QID Shanda Howells, MD      . LORazepam (ATIVAN) injection 0-4 mg  0-4 mg Intravenous Q6H Shanda Howells, MD       Followed by  . [START ON 01/19/2014] LORazepam (ATIVAN)  injection 0-4 mg  0-4 mg Intravenous Q12H Shanda Howells, MD      . LORazepam (ATIVAN) tablet 1 mg  1 mg Oral Q6H PRN Shanda Howells, MD       Or  . LORazepam (ATIVAN) injection 1 mg  1 mg Intravenous Q6H PRN Shanda Howells, MD      . multivitamin with minerals tablet 1 tablet  1 tablet Oral Daily Shanda Howells, MD      . multivitamin with minerals tablet 1 tablet  1 tablet Oral Daily Shanda Howells, MD      .  nitroGLYCERIN (NITROSTAT) SL tablet 0.4 mg  0.4 mg Sublingual Q5 min PRN Shanda Howells, MD      . pantoprazole (PROTONIX) EC tablet 40 mg  40 mg Oral Daily Shanda Howells, MD      . Derrill Memo ON 01/18/2014] QUEtiapine (SEROQUEL) tablet 50 mg  50 mg Oral q morning - 10a Shanda Howells, MD      . risperiDONE (RISPERDAL) tablet 0.5 mg  0.5 mg Oral BID Shanda Howells, MD      . thiamine (VITAMIN B-1) tablet 100 mg  100 mg Oral Daily Shanda Howells, MD       Or  . thiamine (B-1) injection 100 mg  100 mg Intravenous Daily Shanda Howells, MD      . tiotropium [facility]) inhalation capsule 18 mcg  18 mcg Inhalation Daily Shanda Howells, MD       Current Outpatient Prescriptions  Medication Sig Dispense Refill  . albuterol (PROVENTIL) (2.5 MG/3ML) 0.083% nebulizer solution Take 2.5 mg by nebulization 3 (three) times daily.      [name] atorvastatin (LIPITOR) 20 MG tablet Take 1 tablet (20 mg total) by mouth every evening.  30 tablet  3  . folic acid (FOLVITE) 1 MG tablet Take 1 tablet (1 mg total) by mouth daily.  30 tablet  3  . Ipratropium-Albuterol (COMBIVENT RESPIMAT) 20-100 MCG/ACT AERS respimat Inhale 2 puffs into the lungs 4 (four) times daily.  1 Inhaler  1  . losartan (COZAAR) 50 MG tablet Take 50 mg by mouth daily.      . nitroGLYCERIN (NITROSTAT) 0.4 MG SL tablet Place 1 tablet (0.4 mg total) under the tongue every 5 (five) minutes as needed for chest pain (up to 3 doses).  25 tablet  2  . omeprazole (PRILOSEC) 20 MG capsule Take 1 capsule (20 mg total) by mouth daily.  30 capsule  2  . tiotropium  (SPIRIVA HANDIHALER) 18 MCG inhalation capsule Place 1 capsule (18 mcg total) into inhaler and inhale daily.  30 capsule  1  . aspirin EC 81 MG EC tablet Take 1 tablet (81 mg total) by mouth daily.      . clotrimazole (LOTRIMIN) 1 % cream Apply to affected area 2 times daily  15 g  0  . escitalopram (LEXAPRO) 10 MG tablet Take 1 tablet (10 mg total) by mouth daily.  30 tablet  1  . HYDROcodone-acetaminophen (NORCO/VICODIN) 5-325 MG per tablet Take 1 tablet by mouth every 6 (six) hours as needed for moderate pain or severe pain.      [name] LORazepam (ATIVAN) 0.5 MG tablet Take 1 tablet (0.5 mg total) by mouth every 8 (eight) hours as needed (tremor).  10 tablet  0  . Multiple Vitamin (MULTIVITAMIN WITH MINERALS) TABS tablet Take 1 tablet by mouth daily.  30 tablet  3  . QUEtiapine (SEROQUEL) 50 MG tablet Take 1 tablet (50 mg total) by mouth every morning.  30 tablet  1  . risperiDONE (RISPERDAL) 0.5 MG tablet Take 1 tablet (0.5 mg total) by mouth 2 (two) times daily.  30 tablet  1   Review Of Systems: 12 point ROS negative except as noted above in HPI.  Physical Exam: Filed Vitals:   01/17/14 1914  BP: 100/57  Pulse: 72  Temp:   Resp:     General: confused, non combative, mildly cooperative to exam.  HEENT: PERRLA and extra ocular movement intact Heart: S1, S2 normal, no murmur, rub or gallop, regular rate and rhythm Lungs: clear to auscultation,  no wheezes or rales and unlabored breathing Abdomen: minimal TTP, post surgical changes w/ L sided ostomy CDI Extremities: extremities normal, atraumatic, no cyanosis or edema Skin:no rashes, no ecchymoses Neurology: normal without focal findings, no asterixis    Labs and Imaging: Lab Results  Component Value Date/Time   NA 129* 01/17/2014  4:47 PM   K 4.0 01/17/2014  4:47 PM   CL 87* 01/17/2014  4:47 PM   CO2 29 01/17/2014  4:47 PM   BUN 14 01/17/2014  4:47 PM   CREATININE 0.83 01/17/2014  4:47 PM   GLUCOSE 99 01/17/2014  4:47 PM   Lab Results   Component Value Date   WBC 11.2* 01/17/2014   HGB 13.3 01/17/2014   HCT 36.5* 01/17/2014   MCV 89.5 01/17/2014   PLT 304 01/17/2014   Urinalysis    Component Value Date/Time   COLORURINE YELLOW 01/17/2014 1718   APPEARANCEUR CLEAR 01/17/2014 1718   LABSPEC 1.018 01/17/2014 1718   PHURINE 6.5 01/17/2014 1718   GLUCOSEU 100* 01/17/2014 1718   HGBUR NEGATIVE 01/17/2014 1718   BILIRUBINUR NEGATIVE 01/17/2014 1718   KETONESUR NEGATIVE 01/17/2014 1718   PROTEINUR NEGATIVE 01/17/2014 1718   UROBILINOGEN 0.2 01/17/2014 1718   NITRITE NEGATIVE 01/17/2014 1718   LEUKOCYTESUR NEGATIVE 01/17/2014 1718       Ct Head Wo Contrast  01/17/2014   CLINICAL DATA:  Altered mental status.  Head pain.  EXAM: CT HEAD WITHOUT CONTRAST  TECHNIQUE: Contiguous axial images were obtained from the base of the skull through the vertex without intravenous contrast.  COMPARISON:  11/26/2013  FINDINGS: Chronic right greater the left maxillary sinusitis.  The brainstem, cerebellum, cerebral peduncles, thalamus, basal ganglia, basilar cisterns, and ventricular system appear within normal limits. No intracranial hemorrhage, mass lesion, or acute CVA.  IMPRESSION: 1. Chronic maxillary sinusitis.  No acute intracranial abnormality.   Electronically Signed   By: Sherryl Barters M.D.   On: 01/17/2014 18:05   Dg Chest Portable 1 View  01/17/2014   CLINICAL DATA:  Cough.  Smoker.  EXAM: PORTABLE CHEST - 1 VIEW  COMPARISON:  11/26/2013.  FINDINGS: The heart remains normal in size. The lungs remain hyperexpanded with mild central peribronchial thickening. Unremarkable bones.  IMPRESSION: No acute abnormality.  Stable changes of COPD.   Electronically Signed   By: Enrique Sack M.D.   On: 01/17/2014 17:11     Assessment and Plan: Zachary Walls is a 58 y.o. year old male presenting with encephalopathy   Encephalopathy: Multifactorial with ETOH abuse +/- hepatic encephalopathy as predominant source. Ammonia level pending. On CIWA protocol.  Noted mildly elevated WBC count. No active sources of infection currently apart from chronic sinusitis. Start on augmentin. Blood and urine cultures. Repeat CXR in am after gently hydration. Banana bag x1. Follow mentation.   Chest Pain: unclear etiology. EKG and trop WNL. Will cycle CEs. Risk stratification labs including TSH, A1C, lipid profile.   COPD: No resp distress. Satting well on RA. Continue home inhaler regimen.   FEN/GI: NPO pending bedside swallow eval. Noted hyponatremia. Euvolemic. Likely 2/2 beer potomania. Gentle hydration and reassess. Check serum osm.  Prophylaxis: sub q heparin  Disposition: pending further evaluation  Code Status:Full Code       Shanda Howells MD  Pager: [phone]

## 2014-01-18 DIAGNOSIS — J449 Chronic obstructive pulmonary disease, unspecified: Secondary | ICD-10-CM | POA: Diagnosis present

## 2014-01-18 DIAGNOSIS — G931 Anoxic brain damage, not elsewhere classified: Secondary | ICD-10-CM | POA: Diagnosis present

## 2014-01-18 DIAGNOSIS — J320 Chronic maxillary sinusitis: Secondary | ICD-10-CM | POA: Diagnosis present

## 2014-01-18 DIAGNOSIS — F1020 Alcohol dependence, uncomplicated: Secondary | ICD-10-CM | POA: Diagnosis present

## 2014-01-18 DIAGNOSIS — K7682 Hepatic encephalopathy: Secondary | ICD-10-CM | POA: Diagnosis present

## 2014-01-18 DIAGNOSIS — E871 Hypo-osmolality and hyponatremia: Secondary | ICD-10-CM | POA: Diagnosis present

## 2014-01-18 DIAGNOSIS — Z91038 Other insect allergy status: Secondary | ICD-10-CM | POA: Diagnosis not present

## 2014-01-18 DIAGNOSIS — Z8674 Personal history of sudden cardiac arrest: Secondary | ICD-10-CM | POA: Diagnosis not present

## 2014-01-18 DIAGNOSIS — F10239 Alcohol dependence with withdrawal, unspecified: Secondary | ICD-10-CM | POA: Diagnosis present

## 2014-01-18 DIAGNOSIS — Z930 Tracheostomy status: Secondary | ICD-10-CM | POA: Diagnosis not present

## 2014-01-18 DIAGNOSIS — R079 Chest pain, unspecified: Secondary | ICD-10-CM | POA: Diagnosis present

## 2014-01-18 DIAGNOSIS — I10 Essential (primary) hypertension: Secondary | ICD-10-CM | POA: Diagnosis present

## 2014-01-18 DIAGNOSIS — Z9049 Acquired absence of other specified parts of digestive tract: Secondary | ICD-10-CM | POA: Diagnosis not present

## 2014-01-18 DIAGNOSIS — Z931 Gastrostomy status: Secondary | ICD-10-CM | POA: Diagnosis not present

## 2014-01-18 DIAGNOSIS — K7290 Hepatic failure, unspecified without coma: Secondary | ICD-10-CM | POA: Diagnosis present

## 2014-01-18 DIAGNOSIS — Z933 Colostomy status: Secondary | ICD-10-CM | POA: Diagnosis not present

## 2014-01-18 DIAGNOSIS — I959 Hypotension, unspecified: Secondary | ICD-10-CM | POA: Diagnosis present

## 2014-01-18 DIAGNOSIS — I2510 Atherosclerotic heart disease of native coronary artery without angina pectoris: Secondary | ICD-10-CM | POA: Diagnosis present

## 2014-01-18 DIAGNOSIS — H9190 Unspecified hearing loss, unspecified ear: Secondary | ICD-10-CM | POA: Diagnosis present

## 2014-01-18 DIAGNOSIS — F17200 Nicotine dependence, unspecified, uncomplicated: Secondary | ICD-10-CM | POA: Diagnosis present

## 2014-01-18 DIAGNOSIS — I739 Peripheral vascular disease, unspecified: Secondary | ICD-10-CM | POA: Diagnosis present

## 2014-01-18 DIAGNOSIS — K5732 Diverticulitis of large intestine without perforation or abscess without bleeding: Secondary | ICD-10-CM | POA: Diagnosis present

## 2014-01-18 DIAGNOSIS — E43 Unspecified severe protein-calorie malnutrition: Secondary | ICD-10-CM | POA: Diagnosis present

## 2014-01-18 DIAGNOSIS — Z91013 Allergy to seafood: Secondary | ICD-10-CM | POA: Diagnosis not present

## 2014-01-18 DIAGNOSIS — F10939 Alcohol use, unspecified with withdrawal, unspecified: Secondary | ICD-10-CM | POA: Diagnosis not present

## 2014-01-18 DIAGNOSIS — F411 Generalized anxiety disorder: Secondary | ICD-10-CM | POA: Diagnosis present

## 2014-01-18 DIAGNOSIS — D72829 Elevated white blood cell count, unspecified: Secondary | ICD-10-CM | POA: Diagnosis present

## 2014-01-18 DIAGNOSIS — G9340 Encephalopathy, unspecified: Secondary | ICD-10-CM | POA: Diagnosis present

## 2014-01-18 DIAGNOSIS — F1920 Other psychoactive substance dependence, uncomplicated: Secondary | ICD-10-CM | POA: Diagnosis present

## 2014-01-18 DIAGNOSIS — Z885 Allergy status to narcotic agent status: Secondary | ICD-10-CM | POA: Diagnosis not present

## 2014-01-18 LAB — OSMOLALITY: OSMOLALITY: 268 mosm/kg — AB (ref 275–300)

## 2014-01-18 LAB — CBC WITH DIFFERENTIAL/PLATELET
BASOS ABS: 0.1 10*3/uL (ref 0.0–0.1)
Basophils Relative: 1 % (ref 0–1)
EOS PCT: 2 % (ref 0–5)
Eosinophils Absolute: 0.1 10*3/uL (ref 0.0–0.7)
HCT: 34.2 % — ABNORMAL LOW (ref 39.0–52.0)
Hemoglobin: 12.3 g/dL — ABNORMAL LOW (ref 13.0–17.0)
LYMPHS ABS: 2.4 10*3/uL (ref 0.7–4.0)
LYMPHS PCT: 26 % (ref 12–46)
MCH: 32.7 pg (ref 26.0–34.0)
MCHC: 36 g/dL (ref 30.0–36.0)
MCV: 91 fL (ref 78.0–100.0)
MONO ABS: 0.9 10*3/uL (ref 0.1–1.0)
Monocytes Relative: 10 % (ref 3–12)
NEUTROS ABS: 5.5 10*3/uL (ref 1.7–7.7)
Neutrophils Relative %: 62 % (ref 43–77)
Platelets: 262 10*3/uL (ref 150–400)
RBC: 3.76 MIL/uL — ABNORMAL LOW (ref 4.22–5.81)
RDW: 13.3 % (ref 11.5–15.5)
WBC: 9 10*3/uL (ref 4.0–10.5)

## 2014-01-18 LAB — COMPREHENSIVE METABOLIC PANEL
ALBUMIN: 2.9 g/dL — AB (ref 3.5–5.2)
ALT: 12 U/L (ref 0–53)
AST: 18 U/L (ref 0–37)
Alkaline Phosphatase: 55 U/L (ref 39–117)
BUN: 10 mg/dL (ref 6–23)
CALCIUM: 8.5 mg/dL (ref 8.4–10.5)
CO2: 26 mEq/L (ref 19–32)
Chloride: 93 mEq/L — ABNORMAL LOW (ref 96–112)
Creatinine, Ser: 0.73 mg/dL (ref 0.50–1.35)
GFR calc non Af Amer: >90 mL/min (ref >90)
GLUCOSE: 94 mg/dL (ref 70–99)
Potassium: 4.1 mEq/L (ref 3.7–5.3)
Sodium: 130 mEq/L — ABNORMAL LOW (ref 137–147)
Total Bilirubin: 0.3 mg/dL (ref 0.3–1.2)
Total Protein: 5.5 g/dL — ABNORMAL LOW (ref 6.0–8.3)

## 2014-01-18 LAB — TROPONIN I
Troponin I: <0.3 ng/mL (ref <0.30)
Troponin I: <0.3 ng/mL (ref <0.30)

## 2014-01-18 MED ORDER — SODIUM CHLORIDE 0.9 % IV BOLUS (SEPSIS)
500.0000 mL | Freq: Once | INTRAVENOUS | Status: AC
  Administered 2014-01-18: 500 mL via INTRAVENOUS

## 2014-01-18 NOTE — Progress Notes (Signed)
Attempt to complete admission, pt arousable but quickly falling back to sleep, having difficulty remaining awake. Will try again later.

## 2014-01-18 NOTE — Progress Notes (Signed)
BP 86/57 and complaining of some dizziness.  MD on call notified; orders received for 500 cc bolus.  If no improvement in blood pressure, will notify MD again.  Will continue to monitor.

## 2014-01-18 NOTE — Progress Notes (Signed)
UR completed. Patient changed to inpatient- requiring IVF@ 100cc/hr  
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
## 2014-01-18 NOTE — Evaluation (Signed)
Clinical/Bedside Swallow Evaluation Patient Details  Name: Zachary Walls MRN: [id number] Date of Birth: December 18, 1955  Today's Date: 01/18/2014 Time: 0815-0828 SLP Time Calculation (min): 13 min  Past Medical History:  Past Medical History  Diagnosis Date  . COPD (chronic obstructive pulmonary disease)   . History of pneumonia   . Hypertension     a. Normotensive 08/2013 off meds.  [name] HOH (hard of hearing)   . Protein calorie malnutrition   . ETOH abuse   . Tobacco abuse   . Diverticulitis     a. s/p colon resection and colostomy  . Cardiac arrest     a. 01/2013, felt to be 2/2 severe COPD req trach;  b. 01/2013 Echo: EF 65-70% w/o rwma.  . Difficult intubation   . CAD (coronary artery disease)     a. Nonobstructive CAD by cath 08/2013 (mod RCA, mid LAD myocardial bridge) - ECG was concerning for inf STEMI but pt ruled out. CP possibly GERD.   [name] Hyponatremia     a. During 08/2013 felt due to polydipsia.  . Polysubstance abuse     a. Tobacco, marijuana, alcoholism.  [name] Anxiety   . Shortness of breath   . Peripheral vascular disease    Past Surgical History:  Past Surgical History  Procedure Laterality Date  . Hernia repair      inguinal  . Colostomy  12/11/2011    Procedure: COLOSTOMY;  Surgeon: Rolm Bookbinder, MD;  Location: [facility];  Service: General;  Laterality: N/A;  . Colostomy revision  12/11/2011    Procedure: COLON RESECTION SIGMOID;  Surgeon: Rolm Bookbinder, MD;  Location: [facility];  Service: General;;  . Bowel resection  12/11/2011    Procedure: SMALL BOWEL RESECTION;  Surgeon: Rolm Bookbinder, MD;  Location: [facility];  Service: General;;  times 2  . Cystoscopy w/ ureteral stent placement  12/11/2011    Procedure: CYSTOSCOPY WITH STENT REPLACEMENT;  Surgeon: Malka So, MD;  Location: [facility];  Service: Urology;  Laterality: Left;  ureteral catheter placement  . Cardiac catheterization  08/25/2013  . Tracheostomy  01/2013    emergent d/t difficult intubation  . Colon  surgery    . Tracheostomy closure     HPI:  Zachary Walls is a 58 y.o. year old male presenting with encephalopathy Multifactorial with ETOH abuse +/- hepatic encephalopathy as predominant source. Pt hospitalized in 4/14 with trach and PEG due to severe oropharyngeal dysphagia. OP MBS 5/14 rec Dys 3/thin with throat clear and second swallow due to silent penetration. CXR in 2014 and 2015 did not show any acute disease.    Assessment / Plan / Recommendation Clinical Impression  Pt demonstrated adequate tolerance of solids and liquids. He does have a history of moderate oropharyngeal dysphagia a little under a year ago during an acute illness. The pt has tolerated a regular diet and thin liquids at home up to this point without any dx of pna since that time. He does not appear to have any acute functional change today and there are no overt signs of aspiration. Recommend initiating a regular diet and thin liquids. No SLP f/u needed. Will sign off.     Aspiration Risk  Mild    Diet Recommendation Regular;Thin liquid   Liquid Administration via: Cup;Straw Medication Administration: Whole meds with liquid Supervision: Patient able to self feed Postural Changes and/or Swallow Maneuvers: Seated upright 90 degrees    Other  Recommendations Oral Care Recommendations: Patient independent with oral care  Follow Up Recommendations  None    Frequency and Duration        Pertinent Vitals/Pain NA    SLP Swallow Goals     Swallow Study Prior Functional Status       General HPI: Zachary Walls is a 58 y.o. year old male presenting with encephalopathy Multifactorial with ETOH abuse +/- hepatic encephalopathy as predominant source. Pt hospitalized in 4/14 with trach and PEG due to severe oropharyngeal dysphagia. OP MBS 5/14 rec Dys 3/thin with throat clear and second swallow due to silent penetration. CXR in 2014 and 2015 did not show any acute disease.  Type of Study: Bedside swallow  evaluation Previous Swallow Assessment: see HPI Diet Prior to this Study: NPO Temperature Spikes Noted: No Respiratory Status: Room air History of Recent Intubation: No Behavior/Cognition: Alert;Cooperative;Pleasant mood Oral Cavity - Dentition: Adequate natural dentition Self-Feeding Abilities: Able to feed self Patient Positioning: Upright in bed Baseline Vocal Quality: Clear Volitional Cough: Strong Volitional Swallow: Able to elicit    Oral/Motor/Sensory Function Overall Oral Motor/Sensory Function: Appears within functional limits for tasks assessed   Ice Chips     Thin Liquid Thin Liquid: Within functional limits Presentation: Cup;Straw;Self Fed    Nectar Thick Nectar Thick Liquid: Not tested   Honey Thick Honey Thick Liquid: Not tested   Puree Puree: Not tested   Solid   GO    Solid: Within functional limits      [facility], [place] CCC-SLP [phone]  Lynann Beaver 01/18/2014,8:44 AM

## 2014-01-19 LAB — COMPREHENSIVE METABOLIC PANEL
ALT: 10 U/L (ref 0–53)
AST: 16 U/L (ref 0–37)
Albumin: 2.7 g/dL — ABNORMAL LOW (ref 3.5–5.2)
Alkaline Phosphatase: 54 U/L (ref 39–117)
BUN: 7 mg/dL (ref 6–23)
CO2: 25 meq/L (ref 19–32)
CREATININE: 0.79 mg/dL (ref 0.50–1.35)
Calcium: 8.7 mg/dL (ref 8.4–10.5)
Chloride: 100 mEq/L (ref 96–112)
GFR calc Af Amer: >90 mL/min (ref >90)
GFR calc non Af Amer: >90 mL/min (ref >90)
Glucose, Bld: 88 mg/dL (ref 70–99)
Potassium: 3.9 mEq/L (ref 3.7–5.3)
Sodium: 135 mEq/L — ABNORMAL LOW (ref 137–147)
Total Bilirubin: <0.2 mg/dL — ABNORMAL LOW (ref 0.3–1.2)
Total Protein: 5.6 g/dL — ABNORMAL LOW (ref 6.0–8.3)

## 2014-01-19 LAB — URINE CULTURE
CULTURE: NO GROWTH
Colony Count: NO GROWTH

## 2014-01-19 LAB — CBC WITH DIFFERENTIAL/PLATELET
BASOS ABS: 0.1 10*3/uL (ref 0.0–0.1)
BASOS PCT: 1 % (ref 0–1)
Eosinophils Absolute: 0.1 10*3/uL (ref 0.0–0.7)
Eosinophils Relative: 2 % (ref 0–5)
HCT: 32.6 % — ABNORMAL LOW (ref 39.0–52.0)
Hemoglobin: 11.5 g/dL — ABNORMAL LOW (ref 13.0–17.0)
LYMPHS ABS: 1.9 10*3/uL (ref 0.7–4.0)
Lymphocytes Relative: 27 % (ref 12–46)
MCH: 32.5 pg (ref 26.0–34.0)
MCHC: 35.3 g/dL (ref 30.0–36.0)
MCV: 92.1 fL (ref 78.0–100.0)
Monocytes Absolute: 0.7 10*3/uL (ref 0.1–1.0)
Monocytes Relative: 10 % (ref 3–12)
NEUTROS PCT: 60 % (ref 43–77)
Neutro Abs: 4.2 10*3/uL (ref 1.7–7.7)
Platelets: 291 10*3/uL (ref 150–400)
RBC: 3.54 MIL/uL — AB (ref 4.22–5.81)
RDW: 13.5 % (ref 11.5–15.5)
WBC: 7 10*3/uL (ref 4.0–10.5)

## 2014-01-19 MED ORDER — CHLORDIAZEPOXIDE HCL 5 MG PO CAPS
10.0000 mg | ORAL_CAPSULE | Freq: Three times a day (TID) | ORAL | Status: DC
  Administered 2014-01-19 – 2014-01-20 (×4): 10 mg via ORAL
  Filled 2014-01-19 (×4): qty 2

## 2014-01-19 MED ORDER — SODIUM CHLORIDE 0.9 % IV BOLUS (SEPSIS)
500.0000 mL | Freq: Once | INTRAVENOUS | Status: AC
  Administered 2014-01-19: 500 mL via INTRAVENOUS

## 2014-01-19 NOTE — Progress Notes (Signed)
Patient Demographics  Zachary Walls, is a 58 y.o. male, DOB - 1956-03-15, WUJ:[id number]  Admit date - 01/17/2014   Admitting Physician Zachary Howells, MD  Outpatient Primary MD for the patient is Millsaps, [name], NP  LOS - 2   Chief Complaint  Patient presents with  . Altered Mental Status        Assessment & Plan    Encephalopathy: Multifactorial with ETOH withdrawal, with supportive care and with scheduled Librium and as needed IV Ativan per CIWA protocol is improving. Denies any headache. No neck rigidity or photophobia. Continue folic acid and thiamine supplementation. Monitor clinically. Ammonia level stable. Head CT stable.      Mild leukocytosis likely due to sinusitis. Resolved with Augmentin which will be continued. His x-ray UA are stable.      Musculoskeletal Chest Pain: Unremarkable EKG along with cycled troponins, recent echogram noted with EF of 60% no wall motion and preserved EF. Stable A1c- TSH and lipid panel.     COPD: No resp distress. Supportive care no acute issues.     Hypotension and dehydration. Resolved with normal saline will be continued.      Code Status: Full  Family Communication:   Disposition Plan: Home   Procedures CT Head   Consults      Medications  Scheduled Meds: . amoxicillin-clavulanate  1 tablet Oral BID  . aspirin EC  81 mg Oral Daily  . chlordiazePOXIDE  10 mg Oral TID  . clotrimazole   Topical BID  . escitalopram  10 mg Oral Daily  . folic acid  1 mg Oral Daily  . heparin  5,000 Units Subcutaneous 3 times per day  . ipratropium-albuterol  3 mL Inhalation TID  . LORazepam  0-4 mg Intravenous Q6H   Followed by  . LORazepam  0-4 mg Intravenous Q12H  . multivitamin with minerals  1 tablet Oral Daily  .  pantoprazole  40 mg Oral Daily  . QUEtiapine  50 mg Oral q morning - 10a  . risperiDONE  0.5 mg Oral BID  . thiamine  100 mg Oral Daily   Or  . thiamine  100 mg Intravenous Daily   Continuous Infusions: . sodium chloride 100 mL/hr at 01/18/14 1900   PRN Meds:.albuterol, LORazepam, LORazepam, nitroGLYCERIN  DVT Prophylaxis    Heparin    Lab Results  Component Value Date   PLT 291 01/19/2014    Antibiotics    Anti-infectives   Start     Dose/Rate Route Frequency Ordered Stop   01/17/14 2200  amoxicillin-clavulanate (AUGMENTIN) 875-125 MG per tablet 1 tablet     1 tablet Oral 2 times daily 01/17/14 1946            Subjective:   Zachary Walls today has, No headache, No chest pain, No abdominal pain - No Nausea, No new weakness tingling or numbness, No Cough - SOB.    Objective:   Filed Vitals:   01/19/14 0138 01/19/14 0322 01/19/14 0548 01/19/14 0818  BP: 96/55  98/59 121/74  Pulse: 59  58 87  Temp: 97.6 F (36.4 C)  98.2 F (36.8 C) 98.7 F (37.1 C)  TempSrc: Oral  Oral Oral  Resp: 18  18 19   Weight:  SpO2: 97% 96% 9% 99%    Wt Readings from Last 3 Encounters:  01/18/14 52.209 kg (115 lb 1.6 oz)  01/11/14 50.803 kg (112 lb)  12/10/13 48.2 kg (106 lb 4.2 oz)     Intake/Output Summary (Last 24 hours) at 01/19/14 1206 Last data filed at 01/19/14 0910  Gross per 24 hour  Intake   1220 ml  Output   2150 ml  Net   -930 ml     Physical Exam  Awake mildly confused, Oriented X 2, No new F.N deficits, Normal affect [employer].AT,PERRAL Supple Neck,No JVD, No cervical lymphadenopathy appriciated.  Symmetrical Chest wall movement, Good air movement bilaterally, CTAB RRR,No Gallops,Rubs or new Murmurs, No Parasternal Heave +ve B.Sounds, Abd Soft, Non tender, No organomegaly appriciated, No rebound - guarding or rigidity. Colostomy bag in place No Cyanosis, Clubbing or edema, No new Rash or bruise    Data Review   Micro Results Recent Results (from the past 240  hour(s))  URINE CULTURE     Status: None   Collection Time    01/18/14  4:44 AM      Result Value Ref Range Status   Specimen Description URINE, CATHETERIZED   Final   Special Requests NONE   Final   Culture  Setup Time     Final   Value: 01/18/2014 09:11     Performed at [place]     Final   Value: NO GROWTH     Performed at [facility]   Culture     Final   Value: NO GROWTH     Performed at [facility]   Report Status 01/19/2014 FINAL   Final    Radiology Reports Ct Head Wo Contrast  01/17/2014   CLINICAL DATA:  Altered mental status.  Head pain.  EXAM: CT HEAD WITHOUT CONTRAST  TECHNIQUE: Contiguous axial images were obtained from the base of the skull through the vertex without intravenous contrast.  COMPARISON:  11/26/2013  FINDINGS: Chronic right greater the left maxillary sinusitis.  The brainstem, cerebellum, cerebral peduncles, thalamus, basal ganglia, basilar cisterns, and ventricular system appear within normal limits. No intracranial hemorrhage, mass lesion, or acute CVA.  IMPRESSION: 1. Chronic maxillary sinusitis.  No acute intracranial abnormality.   Electronically Signed   By: Sherryl Barters M.D.   On: 01/17/2014 18:05   Dg Chest Portable 1 View  01/17/2014   CLINICAL DATA:  Cough.  Smoker.  EXAM: PORTABLE CHEST - 1 VIEW  COMPARISON:  11/26/2013.  FINDINGS: The heart remains normal in size. The lungs remain hyperexpanded with mild central peribronchial thickening. Unremarkable bones.  IMPRESSION: No acute abnormality.  Stable changes of COPD.   Electronically Signed   By: Enrique Sack M.D.   On: 01/17/2014 17:11    CBC  Recent Labs Lab 01/17/14 1647 01/17/14 1952 01/18/14 0258 01/19/14 0630  WBC 11.2* 8.2 9.0 7.0  HGB 13.3 12.8* 12.3* 11.5*  HCT 36.5* 36.0* 34.2* 32.6*  PLT 304 276 262 291  MCV 89.5 91.1 91.0 92.1  MCH 32.6 32.4 32.7 32.5  MCHC 36.4* 35.6 36.0 35.3  RDW 13.1 13.2 13.3 13.5  LYMPHSABS  --   --   2.4 1.9  MONOABS  --   --  0.9 0.7  EOSABS  --   --  0.1 0.1  BASOSABS  --   --  0.1 0.1    Chemistries   Recent Labs Lab 01/17/14 1647 01/17/14 1952 01/18/14 0258  01/19/14 0630  NA 129*  --  130* 135*  K 4.0  --  4.1 3.9  CL 87*  --  93* 100  CO2 29  --  26 25  GLUCOSE 99  --  94 88  BUN 14  --  10 7  CREATININE 0.83 0.77 0.73 0.79  CALCIUM 9.5  --  8.5 8.7  AST 21  --  18 16  ALT 14  --  12 10  ALKPHOS 69  --  55 54  BILITOT 0.2*  --  0.3 <0.2*   ------------------------------------------------------------------------------------------------------------------ CrCl is unknown because both a height and weight (above a minimum accepted value) are required for this calculation. ------------------------------------------------------------------------------------------------------------------  Recent Labs  01/17/14 1952  HGBA1C 5.4   ------------------------------------------------------------------------------------------------------------------  Recent Labs  01/17/14 1952  CHOL 130  HDL 94  LDLCALC 25  TRIG 56  CHOLHDL 1.4   ------------------------------------------------------------------------------------------------------------------  Recent Labs  01/17/14 1952  TSH 0.517   ------------------------------------------------------------------------------------------------------------------ No results found for this basename: VITAMINB12, FOLATE, FERRITIN, TIBC, IRON, RETICCTPCT,  in the last 72 hours  Coagulation profile No results found for this basename: INR, PROTIME,  in the last 168 hours  No results found for this basename: DDIMER,  in the last 72 hours  Cardiac Enzymes  Recent Labs Lab 01/17/14 1912 01/18/14 0258 01/18/14 0845  TROPONINI <0.30 <0.30 <0.30   ------------------------------------------------------------------------------------------------------------------ No components found with this basename: POCBNP,      Time Spent in  minutes  35   Kahlen Morais K M.D on 01/19/2014 at 12:06 PM  Between 7am to 7pm - Pager - [phone]  After 7pm go to [email or web address] - password [email or web address]  And look for the night coverage person covering for me after hours  [facility] Office  [phone]

## 2014-01-19 NOTE — Progress Notes (Signed)
Shift Note: Pt sitting on side of bed having some hallucinations, which prior nurse did mention in report. Denies pain and/or needs at present. Will continue to monitor. IV site WNL.

## 2014-01-20 LAB — COMPREHENSIVE METABOLIC PANEL
ALBUMIN: 2.7 g/dL — AB (ref 3.5–5.2)
ALK PHOS: 56 U/L (ref 39–117)
ALT: 10 U/L (ref 0–53)
AST: 15 U/L (ref 0–37)
BUN: 5 mg/dL — ABNORMAL LOW (ref 6–23)
CO2: 23 mEq/L (ref 19–32)
Calcium: 8.6 mg/dL (ref 8.4–10.5)
Chloride: 103 mEq/L (ref 96–112)
Creatinine, Ser: 0.85 mg/dL (ref 0.50–1.35)
GFR calc Af Amer: >90 mL/min (ref >90)
GFR calc non Af Amer: >90 mL/min (ref >90)
Glucose, Bld: 100 mg/dL — ABNORMAL HIGH (ref 70–99)
POTASSIUM: 4.3 meq/L (ref 3.7–5.3)
Sodium: 137 mEq/L (ref 137–147)
TOTAL PROTEIN: 5.6 g/dL — AB (ref 6.0–8.3)
Total Bilirubin: <0.2 mg/dL — ABNORMAL LOW (ref 0.3–1.2)

## 2014-01-20 LAB — CBC WITH DIFFERENTIAL/PLATELET
BASOS ABS: 0.1 10*3/uL (ref 0.0–0.1)
Basophils Relative: 1 % (ref 0–1)
EOS PCT: 2 % (ref 0–5)
Eosinophils Absolute: 0.2 10*3/uL (ref 0.0–0.7)
HEMATOCRIT: 33.4 % — AB (ref 39.0–52.0)
HEMOGLOBIN: 11.5 g/dL — AB (ref 13.0–17.0)
Lymphocytes Relative: 22 % (ref 12–46)
Lymphs Abs: 1.9 10*3/uL (ref 0.7–4.0)
MCH: 32 pg (ref 26.0–34.0)
MCHC: 34.4 g/dL (ref 30.0–36.0)
MCV: 93 fL (ref 78.0–100.0)
MONO ABS: 0.8 10*3/uL (ref 0.1–1.0)
MONOS PCT: 9 % (ref 3–12)
Neutro Abs: 5.8 10*3/uL (ref 1.7–7.7)
Neutrophils Relative %: 66 % (ref 43–77)
Platelets: 282 10*3/uL (ref 150–400)
RBC: 3.59 MIL/uL — ABNORMAL LOW (ref 4.22–5.81)
RDW: 13.4 % (ref 11.5–15.5)
WBC: 8.7 10*3/uL (ref 4.0–10.5)

## 2014-01-20 MED ORDER — CHLORDIAZEPOXIDE HCL 5 MG PO CAPS: ORAL_CAPSULE | ORAL | Status: DC

## 2014-01-20 MED ORDER — AMOXICILLIN-POT CLAVULANATE 875-125 MG PO TABS: 1.0000 | ORAL_TABLET | Freq: Two times a day (BID) | ORAL | Status: DC

## 2014-01-20 MED ORDER — LOSARTAN POTASSIUM 50 MG PO TABS
50.0000 mg | ORAL_TABLET | Freq: Every day | ORAL | Status: DC
Start: 2014-01-20 — End: 2014-06-28

## 2014-01-20 MED ORDER — THIAMINE HCL 100 MG PO TABS: 100.0000 mg | ORAL_TABLET | Freq: Every day | ORAL | Status: DC

## 2014-01-20 MED ORDER — METOPROLOL TARTRATE 12.5 MG HALF TABLET: 12.5000 mg | ORAL_TABLET | Freq: Two times a day (BID) | ORAL | Status: DC

## 2014-01-20 NOTE — Discharge Summary (Signed)
Zachary Walls, is a 58 y.o. male  DOB [birth year]  MRN [id number].  Admission date:  01/17/2014  Admitting Physician  Shanda Howells, MD  Discharge Date:  01/20/2014   Primary MD  Zachary Pillow, NP  Recommendations for primary care physician for things to follow:   Repeat CBC, BMP and 2 view chest x-ray in a week   Admission Diagnosis  Encephalopathy   Discharge Diagnosis  Encephalopathy  due to alcohol withdrawal  Active Problems:   COPD (chronic obstructive pulmonary disease)   HOH (hard of hearing)   Severe protein-calorie malnutrition   Anoxic encephalopathy   Hyposmolality and/or hyponatremia   Polysubstance (excluding opioids) dependence   CAD (coronary artery disease)   ETOH abuse   Encephalopathy      Past Medical History  Diagnosis Date  . COPD (chronic obstructive pulmonary disease)   . History of pneumonia   . Hypertension     a. Normotensive 08/2013 off meds.  [name] HOH (hard of hearing)   . Protein calorie malnutrition   . ETOH abuse   . Tobacco abuse   . Diverticulitis     a. s/p colon resection and colostomy  . Cardiac arrest     a. 01/2013, felt to be 2/2 severe COPD req trach;  b. 01/2013 Echo: EF 65-70% w/o rwma.  . Difficult intubation   . CAD (coronary artery disease)     a. Nonobstructive CAD by cath 08/2013 (mod RCA, mid LAD myocardial bridge) - ECG was concerning for inf STEMI but pt ruled out. CP possibly GERD.   [name] Hyponatremia     a. During 08/2013 felt due to polydipsia.  . Polysubstance abuse     a. Tobacco, marijuana, alcoholism.  [name] Anxiety   . Shortness of breath   . Peripheral vascular disease     Past Surgical History  Procedure Laterality Date  . Hernia repair      inguinal  . Colostomy  12/11/2011    Procedure: COLOSTOMY;  Surgeon: Rolm Bookbinder, MD;  Location: [facility];   Service: General;  Laterality: N/A;  . Colostomy revision  12/11/2011    Procedure: COLON RESECTION SIGMOID;  Surgeon: Rolm Bookbinder, MD;  Location: [facility];  Service: General;;  . Bowel resection  12/11/2011    Procedure: SMALL BOWEL RESECTION;  Surgeon: Rolm Bookbinder, MD;  Location: [facility];  Service: General;;  times 2  . Cystoscopy w/ ureteral stent placement  12/11/2011    Procedure: CYSTOSCOPY WITH STENT REPLACEMENT;  Surgeon: Malka So, MD;  Location: [facility];  Service: Urology;  Laterality: Left;  ureteral catheter placement  . Cardiac catheterization  08/25/2013  . Tracheostomy  01/2013    emergent d/t difficult intubation  . Colon surgery    . Tracheostomy closure       Discharge Condition: Stable   Follow UP  Follow-up Information   Follow up with Millsaps, [name], NP. Schedule an appointment as soon as possible for a visit in 3 days.   Contact information:  [facility] [place] [address] [phone]       Follow up with Zachary Argyle, MD. Schedule an appointment as soon as possible for a visit in 1 week.   Specialty:  Cardiology   Contact information:   [id number] [place] [address] [phone]         Discharge Instructions  and  Discharge Medications      Discharge Orders   Future Orders Complete By Expires   Discharge instructions  As directed    Comments:     Follow with Primary MD Millsaps, [name], NP in 3 days   Get CBC, CMP, checked 3 days by Primary MD and again as instructed by your Primary MD. Get a 2 view Chest X ray done next visit .   Activity: As tolerated with Full fall precautions use walker/cane & assistance as needed   Disposition Home     Diet: Heart Healthy . Check your Weight same time everyday, if you gain over 2 pounds, or you develop in leg swelling, experience more shortness of breath or chest pain, call your Primary MD immediately. Follow Cardiac  Low Salt Diet and 1.5 lit/day fluid restriction.   On your next visit with her primary care physician please Get Medicines reviewed and adjusted.  Please request your Prim.MD to go over all Hospital Tests and Procedure/Radiological results at the follow up, please get all Hospital records sent to your Prim MD by signing hospital release before you go home.   If you experience worsening of your admission symptoms, develop shortness of breath, life threatening emergency, suicidal or homicidal thoughts you must seek medical attention immediately by calling 911 or calling your MD immediately  if symptoms less severe.  You Must read complete instructions/literature along with all the possible adverse reactions/side effects for all the Medicines you take and that have been prescribed to you. Take any new Medicines after you have completely understood and accpet all the possible adverse reactions/side effects.   Do not drive and provide baby sitting services if your were admitted for syncope or siezures until you have seen by Primary MD or a Neurologist and advised to do so again.  Do not drive when taking Pain medications.    Do not take more than prescribed Pain, Sleep and Anxiety Medications  Special Instructions: If you have smoked or chewed Tobacco  in the last 2 yrs please stop smoking, stop any regular Alcohol  and or any Recreational drug use.  Wear Seat belts while driving.   Please note  You were cared for by a hospitalist during your hospital stay. If you have any questions about your discharge medications or the care you received while you were in the hospital after you are discharged, you can call the unit and asked to speak with the hospitalist on call if the hospitalist that took care of you is not available. Once you are discharged, your primary care physician will handle any further medical issues. Please note that NO REFILLS for any discharge medications will be authorized once  you are discharged, as it is imperative that you return to your primary care physician (or establish a relationship with a primary care physician if you do not have one) for your aftercare needs so that they can reassess your need for medications and monitor your lab values.   Increase activity slowly  As directed        Medication List  STOP taking these medications       LORazepam 0.5 MG tablet  Commonly known as:  ATIVAN      TAKE these medications       albuterol (2.5 MG/3ML) 0.083% nebulizer solution  Commonly known as:  PROVENTIL  Take 2.5 mg by nebulization 3 (three) times daily.     albuterol-ipratropium 18-103 MCG/ACT inhaler  Commonly known as:  COMBIVENT  Inhale 2 puffs into the lungs every 4 (four) hours as needed for wheezing or shortness of breath.     amoxicillin-clavulanate 875-125 MG per tablet  Commonly known as:  AUGMENTIN  Take 1 tablet by mouth 2 (two) times daily.     atorvastatin 20 MG tablet  Commonly known as:  LIPITOR  Take 1 tablet (20 mg total) by mouth every evening.     chlordiazePOXIDE 5 MG capsule  Commonly known as:  LIBRIUM  Please dispense 18 pills - Take 1 pill three times a day for 3 days, then Take 1 pill two times a day for 3 days, then Take 1 pill once a day for 3 days and stop.     clotrimazole 1 % cream  Commonly known as:  LOTRIMIN  Apply to affected area 2 times daily     escitalopram 10 MG tablet  Commonly known as:  LEXAPRO  Take 1 tablet (10 mg total) by mouth daily.     folic acid 1 MG tablet  Commonly known as:  FOLVITE  Take 1 tablet (1 mg total) by mouth daily.     losartan 50 MG tablet  Commonly known as:  COZAAR  Take 1 tablet (50 mg total) by mouth daily.     metoprolol tartrate 12.5 mg Tabs tablet  Commonly known as:  LOPRESSOR  Take 0.5 tablets (12.5 mg total) by mouth 2 (two) times daily.     nitroGLYCERIN 0.4 MG SL tablet  Commonly known as:  NITROSTAT  Place 1 tablet (0.4 mg total) under the tongue  every 5 (five) minutes as needed for chest pain (up to 3 doses).     omeprazole 20 MG capsule  Commonly known as:  PRILOSEC  Take 1 capsule (20 mg total) by mouth daily.     QUEtiapine 50 MG tablet  Commonly known as:  SEROQUEL  Take 1 tablet (50 mg total) by mouth every morning.     risperiDONE 0.5 MG tablet  Commonly known as:  RISPERDAL  Take 1 tablet (0.5 mg total) by mouth 2 (two) times daily.     thiamine 100 MG tablet  Take 1 tablet (100 mg total) by mouth daily.     tiotropium 18 MCG inhalation capsule  Commonly known as:  SPIRIVA  Place 18 mcg into inhaler and inhale daily.          Diet and Activity recommendation: See Discharge Instructions above   Consults obtained -     Major procedures and Radiology Reports - PLEASE review detailed and final reports for all details, in brief -       Ct Head Wo Contrast  01/17/2014   CLINICAL DATA:  Altered mental status.  Head pain.  EXAM: CT HEAD WITHOUT CONTRAST  TECHNIQUE: Contiguous axial images were obtained from the base of the skull through the vertex without intravenous contrast.  COMPARISON:  11/26/2013  FINDINGS: Chronic right greater the left maxillary sinusitis.  The brainstem, cerebellum, cerebral peduncles, thalamus, basal ganglia, basilar cisterns, and ventricular system appear within normal limits. No intracranial hemorrhage, mass  lesion, or acute CVA.  IMPRESSION: 1. Chronic maxillary sinusitis.  No acute intracranial abnormality.   Electronically Signed   By: Sherryl Barters M.D.   On: 01/17/2014 18:05   Dg Chest Portable 1 View  01/17/2014   CLINICAL DATA:  Cough.  Smoker.  EXAM: PORTABLE CHEST - 1 VIEW  COMPARISON:  11/26/2013.  FINDINGS: The heart remains normal in size. The lungs remain hyperexpanded with mild central peribronchial thickening. Unremarkable bones.  IMPRESSION: No acute abnormality.  Stable changes of COPD.   Electronically Signed   By: Enrique Sack M.D.   On: 01/17/2014 17:11    Micro  Results      Recent Results (from the past 240 hour(s))  URINE CULTURE     Status: None   Collection Time    01/18/14  4:44 AM      Result Value Ref Range Status   Specimen Description URINE, CATHETERIZED   Final   Special Requests NONE   Final   Culture  Setup Time     Final   Value: 01/18/2014 09:11     Performed at [facility]     Final   Value: NO GROWTH     Performed at [facility]   Culture     Final   Value: NO GROWTH     Performed at [facility]   Report Status 01/19/2014 FINAL   Final     History of present illness and  Hospital Course:     Kindly see H&P for history of present illness and admission details, please review complete Labs, Consult reports and Test reports for all details in brief Zachary Walls, is a 58 y.o. male, patient with history of  ETOH abuse, polysubstance dependence, CAD, COPD, diverticulitis s/p bowel resection and colostomy brought in to the hospital with confusion due to alcohol withdrawal, also had mild leukocytosis due to sinusitis along with some musculoskeletal chest pain, dehydration and hypotension. He tells me today that he was not eating or drinking well for the last few days and had ran out of alcohol few days prior to admission.      Encephalopathy: Multifactorial but predominantly from ETOH withdrawal, close to baseline with supportive care which included IV fluids, is scheduled Librium and CIWA protocol. He never had any headache. No neck rigidity or photophobia. Now close to baseline and wants to go home, counseled not to indulge in alcohol abuse again, discussed with his daughter who was agreeable with the plan and patient will move with her daughter for the next week. Will continue on gentle Librium he along with folic acid and thiamine supplementation. On admission he had stable ammonia levels and head CT. He will follow with PCP in 3-5 days.       Mild leukocytosis likely due to sinusitis.  Resolved with Augmentin which will be continued. His x-ray UA are stable.     Musculoskeletal Chest Pain: Unremarkable EKG along with cycled troponins, recent echogram noted with EF of 60% no wall motion and preserved EF. Stable A1c- TSH and lipid panel.     COPD: No resp distress. Sternotomy indulge in smoking, no acute issues.     Hypotension and dehydration. Resolved with normal saline will be continued.        Today   Subjective:   Hawk Mones today has no headache,no chest abdominal pain,no new weakness tingling or numbness, feels much better wants to go home today.   Objective:  Blood pressure 159/86, pulse 97, temperature 98.5 F (36.9 C), temperature source Oral, resp. rate 16, height 5\' 4"  (1.626 m), weight 55.203 kg (121 lb 11.2 oz), SpO2 94.00%.   Intake/Output Summary (Last 24 hours) at 01/20/14 0930 Last data filed at 01/19/14 1700  Gross per 24 hour  Intake    480 ml  Output      0 ml  Net    480 ml    Exam Awake Alert, Oriented *3, No new F.N deficits, Normal affect [place].AT,PERRAL Supple Neck,No JVD, No cervical lymphadenopathy appriciated.  Symmetrical Chest wall movement, Good air movement bilaterally, CTAB RRR,No Gallops,Rubs or new Murmurs, No Parasternal Heave +ve B.Sounds, Abd Soft, Non tender, No organomegaly appriciated, No rebound -guarding or rigidity. No Cyanosis, Clubbing or edema, No new Rash or bruise  Data Review   CBC w Diff: Lab Results  Component Value Date   WBC 8.7 01/20/2014   HGB 11.5* 01/20/2014   HCT 33.4* 01/20/2014   PLT 282 01/20/2014   LYMPHOPCT 22 01/20/2014   MONOPCT 9 01/20/2014   EOSPCT 2 01/20/2014   BASOPCT 1 01/20/2014    CMP: Lab Results  Component Value Date   NA 137 01/20/2014   K 4.3 01/20/2014   CL 103 01/20/2014   CO2 23 01/20/2014   BUN 5* 01/20/2014   CREATININE 0.85 01/20/2014   PROT 5.6* 01/20/2014   ALBUMIN 2.7* 01/20/2014   BILITOT <0.2* 01/20/2014   ALKPHOS 56 01/20/2014   AST 15 01/20/2014   ALT 10 01/20/2014   .   Total Time in preparing paper work, data evaluation and todays exam - 35 minutes  Thurnell Lose M.D on 01/20/2014 at 9:30 AM  [place]  [phone]

## 2014-01-20 NOTE — Progress Notes (Signed)
Pt alert and oriented x 4. VSS. Denies Pain. Slight hand tremors noted while holding out.  Pleasant. Very HOH.

## 2014-01-20 NOTE — Progress Notes (Deleted)
Report called to [facility], [place] [phone].

## 2014-01-20 NOTE — Progress Notes (Signed)
Pt discharge instructions given. Pt verbalized understanding. VSS. Denies pain.  Pts and family discharge instructions and prescriptions given.  Pt left floor via wheelchair accompanied by staff and family.

## 2014-01-20 NOTE — Discharge Instructions (Signed)
Follow with Primary MD Millsaps, [name], NP in 3 days   Get CBC, CMP, checked 3 days by Primary MD and again as instructed by your Primary MD. Get a 2 view Chest X ray done next visit .   Activity: As tolerated with Full fall precautions use walker/cane & assistance as needed   Disposition Home     Diet: Heart Healthy . Check your Weight same time everyday, if you gain over 2 pounds, or you develop in leg swelling, experience more shortness of breath or chest pain, call your Primary MD immediately. Follow Cardiac Low Salt Diet and 1.5 lit/day fluid restriction.   On your next visit with her primary care physician please Get Medicines reviewed and adjusted.  Please request your Prim.MD to go over all Hospital Tests and Procedure/Radiological results at the follow up, please get all Hospital records sent to your Prim MD by signing hospital release before you go home.   If you experience worsening of your admission symptoms, develop shortness of breath, life threatening emergency, suicidal or homicidal thoughts you must seek medical attention immediately by calling 911 or calling your MD immediately  if symptoms less severe.  You Must read complete instructions/literature along with all the possible adverse reactions/side effects for all the Medicines you take and that have been prescribed to you. Take any new Medicines after you have completely understood and accpet all the possible adverse reactions/side effects.   Do not drive and provide baby sitting services if your were admitted for syncope or siezures until you have seen by Primary MD or a Neurologist and advised to do so again.  Do not drive when taking Pain medications.    Do not take more than prescribed Pain, Sleep and Anxiety Medications  Special Instructions: If you have smoked or chewed Tobacco  in the last 2 yrs please stop smoking, stop any regular Alcohol  and or any Recreational drug use.  Wear Seat belts while  driving.   Please note  You were cared for by a hospitalist during your hospital stay. If you have any questions about your discharge medications or the care you received while you were in the hospital after you are discharged, you can call the unit and asked to speak with the hospitalist on call if the hospitalist that took care of you is not available. Once you are discharged, your primary care physician will handle any further medical issues. Please note that NO REFILLS for any discharge medications will be authorized once you are discharged, as it is imperative that you return to your primary care physician (or establish a relationship with a primary care physician if you do not have one) for your aftercare needs so that they can reassess your need for medications and monitor your lab values.

## 2014-01-26 DIAGNOSIS — I10 Essential (primary) hypertension: Secondary | ICD-10-CM

## 2014-01-26 DIAGNOSIS — J449 Chronic obstructive pulmonary disease, unspecified: Secondary | ICD-10-CM

## 2014-01-26 DIAGNOSIS — I2510 Atherosclerotic heart disease of native coronary artery without angina pectoris: Secondary | ICD-10-CM

## 2014-01-26 DIAGNOSIS — I739 Peripheral vascular disease, unspecified: Secondary | ICD-10-CM

## 2014-01-29 ENCOUNTER — Emergency Department (HOSPITAL_COMMUNITY): Payer: Medicaid Other

## 2014-01-29 ENCOUNTER — Encounter (HOSPITAL_COMMUNITY): Payer: Self-pay | Admitting: Emergency Medicine

## 2014-01-29 ENCOUNTER — Inpatient Hospital Stay (HOSPITAL_COMMUNITY)
Admission: EM | Admit: 2014-01-29 | Discharge: 2014-02-06 | DRG: 200 | Disposition: A | Discharge status: Skilled Nursing Facility | Payer: Medicaid Other | Attending: General Surgery | Admitting: General Surgery

## 2014-01-29 DIAGNOSIS — F411 Generalized anxiety disorder: Secondary | ICD-10-CM | POA: Diagnosis present

## 2014-01-29 DIAGNOSIS — D62 Acute posthemorrhagic anemia: Secondary | ICD-10-CM | POA: Diagnosis not present

## 2014-01-29 DIAGNOSIS — Z8249 Family history of ischemic heart disease and other diseases of the circulatory system: Secondary | ICD-10-CM

## 2014-01-29 DIAGNOSIS — Z933 Colostomy status: Secondary | ICD-10-CM

## 2014-01-29 DIAGNOSIS — S270XXA Traumatic pneumothorax, initial encounter: Secondary | ICD-10-CM

## 2014-01-29 DIAGNOSIS — S2239XA Fracture of one rib, unspecified side, initial encounter for closed fracture: Secondary | ICD-10-CM | POA: Diagnosis present

## 2014-01-29 DIAGNOSIS — J449 Chronic obstructive pulmonary disease, unspecified: Secondary | ICD-10-CM | POA: Diagnosis present

## 2014-01-29 DIAGNOSIS — F10239 Alcohol dependence with withdrawal, unspecified: Secondary | ICD-10-CM | POA: Diagnosis present

## 2014-01-29 DIAGNOSIS — E46 Unspecified protein-calorie malnutrition: Secondary | ICD-10-CM | POA: Diagnosis present

## 2014-01-29 DIAGNOSIS — Z681 Body mass index (BMI) 19 or less, adult: Secondary | ICD-10-CM

## 2014-01-29 DIAGNOSIS — I10 Essential (primary) hypertension: Secondary | ICD-10-CM | POA: Diagnosis present

## 2014-01-29 DIAGNOSIS — N390 Urinary tract infection, site not specified: Secondary | ICD-10-CM | POA: Diagnosis present

## 2014-01-29 DIAGNOSIS — S42033A Displaced fracture of lateral end of unspecified clavicle, initial encounter for closed fracture: Secondary | ICD-10-CM | POA: Diagnosis present

## 2014-01-29 DIAGNOSIS — F1020 Alcohol dependence, uncomplicated: Secondary | ICD-10-CM | POA: Diagnosis present

## 2014-01-29 DIAGNOSIS — F1010 Alcohol abuse, uncomplicated: Secondary | ICD-10-CM | POA: Diagnosis present

## 2014-01-29 DIAGNOSIS — J939 Pneumothorax, unspecified: Secondary | ICD-10-CM

## 2014-01-29 DIAGNOSIS — S2241XA Multiple fractures of ribs, right side, initial encounter for closed fracture: Secondary | ICD-10-CM | POA: Diagnosis present

## 2014-01-29 DIAGNOSIS — I2510 Atherosclerotic heart disease of native coronary artery without angina pectoris: Secondary | ICD-10-CM | POA: Diagnosis present

## 2014-01-29 DIAGNOSIS — F04 Amnestic disorder due to known physiological condition: Secondary | ICD-10-CM | POA: Diagnosis present

## 2014-01-29 DIAGNOSIS — F068 Other specified mental disorders due to known physiological condition: Secondary | ICD-10-CM

## 2014-01-29 DIAGNOSIS — I739 Peripheral vascular disease, unspecified: Secondary | ICD-10-CM | POA: Diagnosis present

## 2014-01-29 DIAGNOSIS — J4489 Other specified chronic obstructive pulmonary disease: Secondary | ICD-10-CM | POA: Diagnosis present

## 2014-01-29 DIAGNOSIS — F10939 Alcohol use, unspecified with withdrawal, unspecified: Secondary | ICD-10-CM | POA: Diagnosis present

## 2014-01-29 DIAGNOSIS — S42001A Fracture of unspecified part of right clavicle, initial encounter for closed fracture: Secondary | ICD-10-CM | POA: Diagnosis present

## 2014-01-29 DIAGNOSIS — S2249XA Multiple fractures of ribs, unspecified side, initial encounter for closed fracture: Secondary | ICD-10-CM

## 2014-01-29 DIAGNOSIS — S42009A Fracture of unspecified part of unspecified clavicle, initial encounter for closed fracture: Secondary | ICD-10-CM

## 2014-01-29 DIAGNOSIS — E871 Hypo-osmolality and hyponatremia: Secondary | ICD-10-CM | POA: Diagnosis not present

## 2014-01-29 DIAGNOSIS — F17200 Nicotine dependence, unspecified, uncomplicated: Secondary | ICD-10-CM | POA: Diagnosis present

## 2014-01-29 LAB — I-STAT CHEM 8, ED
BUN: 7 mg/dL (ref 6–23)
Calcium, Ion: 1.16 mmol/L (ref 1.12–1.23)
Chloride: 93 mEq/L — ABNORMAL LOW (ref 96–112)
Creatinine, Ser: 1.1 mg/dL (ref 0.50–1.35)
Glucose, Bld: 92 mg/dL (ref 70–99)
HCT: 43 % (ref 39.0–52.0)
Hemoglobin: 14.6 g/dL (ref 13.0–17.0)
Potassium: 4 mEq/L (ref 3.7–5.3)
Sodium: 131 mEq/L — ABNORMAL LOW (ref 137–147)
TCO2: 27 mmol/L (ref 0–100)

## 2014-01-29 LAB — ETHANOL: Alcohol, Ethyl (B): 159 mg/dL — ABNORMAL HIGH (ref 0–11)

## 2014-01-29 MED ORDER — PANTOPRAZOLE SODIUM 40 MG PO TBEC
40.0000 mg | DELAYED_RELEASE_TABLET | Freq: Every day | ORAL | Status: DC
  Administered 2014-01-30 – 2014-02-06 (×7): 40 mg via ORAL
  Filled 2014-01-29 (×8): qty 1

## 2014-01-29 MED ORDER — LOSARTAN POTASSIUM 50 MG PO TABS
50.0000 mg | ORAL_TABLET | Freq: Every day | ORAL | Status: DC
  Administered 2014-01-30 – 2014-02-06 (×8): 50 mg via ORAL
  Filled 2014-01-29 (×9): qty 1

## 2014-01-29 MED ORDER — FENTANYL CITRATE 0.05 MG/ML IJ SOLN
INTRAMUSCULAR | Status: AC
  Filled 2014-01-29: qty 2

## 2014-01-29 MED ORDER — PANTOPRAZOLE SODIUM 40 MG IV SOLR
40.0000 mg | Freq: Every day | INTRAVENOUS | Status: DC
  Administered 2014-01-29: 40 mg via INTRAVENOUS
  Filled 2014-01-29 (×2): qty 40

## 2014-01-29 MED ORDER — FENTANYL CITRATE 0.05 MG/ML IJ SOLN
100.0000 ug | Freq: Once | INTRAMUSCULAR | Status: AC
Start: 2014-01-29 — End: 2014-01-29
  Administered 2014-01-29: 100 ug via INTRAVENOUS

## 2014-01-29 MED ORDER — MORPHINE SULFATE 2 MG/ML IJ SOLN
2.0000 mg | INTRAMUSCULAR | Status: DC | PRN
  Administered 2014-01-29 – 2014-02-05 (×11): 2 mg via INTRAVENOUS
  Filled 2014-01-29 (×12): qty 1

## 2014-01-29 MED ORDER — ENOXAPARIN SODIUM 40 MG/0.4ML ~~LOC~~ SOLN
40.0000 mg | SUBCUTANEOUS | Status: DC
  Administered 2014-01-30 – 2014-02-06 (×8): 40 mg via SUBCUTANEOUS
  Filled 2014-01-29 (×8): qty 0.4

## 2014-01-29 MED ORDER — ONDANSETRON HCL 4 MG PO TABS: 4.0000 mg | ORAL_TABLET | Freq: Four times a day (QID) | ORAL | Status: DC | PRN

## 2014-01-29 MED ORDER — ESCITALOPRAM OXALATE 10 MG PO TABS
10.0000 mg | ORAL_TABLET | Freq: Every day | ORAL | Status: DC
  Administered 2014-01-30 – 2014-02-06 (×8): 10 mg via ORAL
  Filled 2014-01-29 (×9): qty 1

## 2014-01-29 MED ORDER — QUETIAPINE FUMARATE 50 MG PO TABS
50.0000 mg | ORAL_TABLET | Freq: Every morning | ORAL | Status: DC
  Administered 2014-01-30 – 2014-02-06 (×8): 50 mg via ORAL
  Filled 2014-01-29 (×8): qty 1

## 2014-01-29 MED ORDER — NITROGLYCERIN 0.4 MG SL SUBL: 0.4000 mg | SUBLINGUAL_TABLET | SUBLINGUAL | Status: DC | PRN

## 2014-01-29 MED ORDER — TIOTROPIUM BROMIDE MONOHYDRATE 18 MCG IN CAPS
18.0000 ug | ORAL_CAPSULE | Freq: Every day | RESPIRATORY_TRACT | Status: DC
  Administered 2014-01-30 – 2014-02-06 (×8): 18 ug via RESPIRATORY_TRACT
  Filled 2014-01-29 (×2): qty 5

## 2014-01-29 MED ORDER — MIDAZOLAM HCL 2 MG/2ML IJ SOLN
INTRAMUSCULAR | Status: AC
  Filled 2014-01-29: qty 2

## 2014-01-29 MED ORDER — KCL IN DEXTROSE-NACL 20-5-0.45 MEQ/L-%-% IV SOLN
INTRAVENOUS | Status: DC
  Administered 2014-01-29 – 2014-02-02 (×7): via INTRAVENOUS
  Filled 2014-01-29 (×8): qty 1000

## 2014-01-29 MED ORDER — RISPERIDONE 0.5 MG PO TABS
0.5000 mg | ORAL_TABLET | Freq: Two times a day (BID) | ORAL | Status: DC
  Administered 2014-01-30 – 2014-02-06 (×14): 0.5 mg via ORAL
  Filled 2014-01-29 (×17): qty 1

## 2014-01-29 MED ORDER — VITAMIN B-1 100 MG PO TABS
100.0000 mg | ORAL_TABLET | Freq: Every day | ORAL | Status: DC
  Administered 2014-01-30 – 2014-02-06 (×8): 100 mg via ORAL
  Filled 2014-01-29 (×9): qty 1

## 2014-01-29 MED ORDER — LORAZEPAM 2 MG/ML IJ SOLN
1.0000 mg | INTRAMUSCULAR | Status: DC | PRN
  Administered 2014-01-31 (×2): 1 mg via INTRAVENOUS
  Administered 2014-01-31 – 2014-02-01 (×3): 2 mg via INTRAVENOUS
  Administered 2014-02-01: 1 mg via INTRAVENOUS
  Administered 2014-02-01 (×5): 2 mg via INTRAVENOUS
  Administered 2014-02-02: 1 mg via INTRAVENOUS
  Administered 2014-02-02 – 2014-02-05 (×10): 2 mg via INTRAVENOUS
  Filled 2014-01-29 (×23): qty 1

## 2014-01-29 MED ORDER — ALBUTEROL SULFATE (2.5 MG/3ML) 0.083% IN NEBU
2.5000 mg | INHALATION_SOLUTION | Freq: Three times a day (TID) | RESPIRATORY_TRACT | Status: DC
  Administered 2014-01-29 – 2014-02-04 (×19): 2.5 mg via RESPIRATORY_TRACT
  Filled 2014-01-29 (×18): qty 3

## 2014-01-29 MED ORDER — MIDAZOLAM HCL 2 MG/2ML IJ SOLN
2.0000 mg | Freq: Once | INTRAMUSCULAR | Status: AC
  Administered 2014-01-29: 2 mg via INTRAVENOUS

## 2014-01-29 MED ORDER — IOHEXOL 300 MG/ML  SOLN: 100.0000 mL | Freq: Once | INTRAMUSCULAR | Status: AC | PRN

## 2014-01-29 MED ORDER — ATORVASTATIN CALCIUM 20 MG PO TABS
20.0000 mg | ORAL_TABLET | Freq: Every evening | ORAL | Status: DC
  Administered 2014-01-30 – 2014-02-05 (×5): 20 mg via ORAL
  Filled 2014-01-29 (×9): qty 1

## 2014-01-29 MED ORDER — HYDROCODONE-ACETAMINOPHEN 10-325 MG PO TABS
0.5000 | ORAL_TABLET | ORAL | Status: DC | PRN
  Administered 2014-01-30: 1 via ORAL
  Administered 2014-01-30: 2 via ORAL
  Administered 2014-01-30 (×3): 1 via ORAL
  Administered 2014-01-31: 2 via ORAL
  Administered 2014-01-31: 1 via ORAL
  Administered 2014-02-01: 2 via ORAL
  Filled 2014-01-29: qty 1
  Filled 2014-01-29: qty 2
  Filled 2014-01-29 (×2): qty 1
  Filled 2014-01-29: qty 2
  Filled 2014-01-29: qty 1
  Filled 2014-01-29: qty 2
  Filled 2014-01-29: qty 1
  Filled 2014-01-29: qty 2

## 2014-01-29 MED ORDER — ONDANSETRON HCL 4 MG/2ML IJ SOLN
4.0000 mg | Freq: Four times a day (QID) | INTRAMUSCULAR | Status: DC | PRN
  Administered 2014-01-30: 4 mg via INTRAVENOUS
  Filled 2014-01-29: qty 2

## 2014-01-29 MED ORDER — ALBUTEROL SULFATE (2.5 MG/3ML) 0.083% IN NEBU
INHALATION_SOLUTION | RESPIRATORY_TRACT | Status: AC
  Administered 2014-01-29: 2.5 mg via RESPIRATORY_TRACT
  Filled 2014-01-29: qty 3

## 2014-01-29 MED ORDER — DOCUSATE SODIUM 100 MG PO CAPS
100.0000 mg | ORAL_CAPSULE | Freq: Two times a day (BID) | ORAL | Status: DC
  Administered 2014-01-30 – 2014-02-06 (×12): 100 mg via ORAL
  Filled 2014-01-29 (×13): qty 1

## 2014-01-29 MED ORDER — METOPROLOL TARTRATE 12.5 MG HALF TABLET
12.5000 mg | ORAL_TABLET | Freq: Two times a day (BID) | ORAL | Status: DC
  Administered 2014-01-30 – 2014-02-05 (×12): 12.5 mg via ORAL
  Filled 2014-01-29 (×17): qty 1

## 2014-01-29 MED ORDER — FOLIC ACID 1 MG PO TABS
1.0000 mg | ORAL_TABLET | Freq: Every day | ORAL | Status: DC
  Administered 2014-01-30 – 2014-02-06 (×8): 1 mg via ORAL
  Filled 2014-01-29 (×9): qty 1
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
## 2014-01-29 NOTE — Procedures (Signed)
Chest Tube Insertion Procedure Note  Indications:  Clinically significant Pneumothorax  Pre-operative Diagnosis: Pneumothorax  Post-operative Diagnosis: Pneumothorax  Procedure Details  Informed consent was obtained for the procedure, including sedation.  Risks of lung perforation, hemorrhage, arrhythmia, and adverse drug reaction were discussed.   After sterile skin prep, using standard technique, a 28 French tube was placed in the right anterolateral rib space.  Findings: None  Estimated Blood Loss:  less than 50 mL         Specimens:  None              Complications:  None; patient tolerated the procedure well.         Disposition: ED         Condition: stable  Attending Attestation: I performed the procedure.    Lisette Abu, PA-C Pager: [phone] General Trauma PA Pager: [phone]

## 2014-01-29 NOTE — ED Notes (Signed)
Right sided chest tube placed by Silvestre Gunner.
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
## 2014-01-29 NOTE — ED Notes (Signed)
Air leak noted from chest tube, rapid response RN called and assisted RN to redress site- air leak decreased.
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
## 2014-01-29 NOTE — ED Notes (Signed)
PA Jacqulynn Cadet made aware sucking sound heard around chest tube site. Reports change the petroleum dressing, and reinforce it. This was done also told to turn suction up to 40 on the [place]. Which was also done by this RN.

## 2014-01-29 NOTE — ED Notes (Signed)
In CT with patient 

## 2014-01-29 NOTE — ED Notes (Signed)
Called CT made aware patient ready for transport to CT.
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
## 2014-01-30 ENCOUNTER — Inpatient Hospital Stay (HOSPITAL_COMMUNITY): Payer: Medicaid Other

## 2014-01-30 LAB — BASIC METABOLIC PANEL
BUN: 8 mg/dL (ref 6–23)
CHLORIDE: 93 meq/L — AB (ref 96–112)
CO2: 26 mEq/L (ref 19–32)
Calcium: 8.7 mg/dL (ref 8.4–10.5)
Creatinine, Ser: 0.71 mg/dL (ref 0.50–1.35)
GFR calc Af Amer: >90 mL/min (ref >90)
GLUCOSE: 111 mg/dL — AB (ref 70–99)
POTASSIUM: 4.9 meq/L (ref 3.7–5.3)
SODIUM: 132 meq/L — AB (ref 137–147)

## 2014-01-30 LAB — CBC
HCT: 37 % — ABNORMAL LOW (ref 39.0–52.0)
HEMOGLOBIN: 13 g/dL (ref 13.0–17.0)
MCH: 32.4 pg (ref 26.0–34.0)
MCHC: 35.1 g/dL (ref 30.0–36.0)
MCV: 92.3 fL (ref 78.0–100.0)
Platelets: 310 10*3/uL (ref 150–400)
RBC: 4.01 MIL/uL — ABNORMAL LOW (ref 4.22–5.81)
RDW: 13.3 % (ref 11.5–15.5)
WBC: 12.3 10*3/uL — AB (ref 4.0–10.5)

## 2014-01-30 LAB — MRSA PCR SCREENING: MRSA by PCR: NEGATIVE

## 2014-01-30 NOTE — ED Provider Notes (Signed)
Medical screening examination/treatment/procedure(s) were conducted as a shared visit with non-physician practitioner(s) and myself.  I personally evaluated the patient during the encounter.   EKG Interpretation None     Pt seen and evaluated, pt c/o right sided chest pain, BSS, diffuse wheezing.  Pt has 25% PTX on cxr.  Trauma consulted, they are placing chest tube and plan for admission.   Threasa Beards, MD 01/30/14 [phone]

## 2014-01-30 NOTE — Progress Notes (Signed)
Pt admitted to 2S-03, no family at bedside. Belongings noted in back of room, asked pt permission to search for valuables. Pt agreed. Home prescriptions found, sent to pharmacy. Black harley [employer] found, no money inside.  Pt told RN that he had a "small bag of weed" in his coat pocket of unknown weight. Small bag of grassy green substance found. Pt refused security to take belongings (wallet and small bag of grassy green substance). Agricultural consultant notified, who called house coverage RN, Hazle Nordmann. Hazle Nordmann called executive administrator on call- Javier Docker who said pt is to keep green grassy substance in his belongings. Pt offered option of calling family to pick up belongings, family refused. Pt aware that [facility] is not liable if belongings are lost or stolen. [employer]
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
## 2014-01-31 ENCOUNTER — Inpatient Hospital Stay (HOSPITAL_COMMUNITY): Payer: Medicaid Other

## 2014-01-31 MED ORDER — SPIRITUS FRUMENTI
1.0000 | Freq: Three times a day (TID) | ORAL | Status: DC
  Administered 2014-01-31 – 2014-02-04 (×7): 1 via ORAL
  Filled 2014-01-31 (×17): qty 1
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
## 2014-01-31 NOTE — Progress Notes (Signed)
CXR results called to MD Rosendo Gros.

## 2014-02-01 ENCOUNTER — Inpatient Hospital Stay (HOSPITAL_COMMUNITY): Payer: Medicaid Other

## 2014-02-01 MED ORDER — ENSURE COMPLETE PO LIQD
237.0000 mL | Freq: Two times a day (BID) | ORAL | Status: DC
  Administered 2014-02-04 – 2014-02-06 (×4): 237 mL via ORAL

## 2014-02-01 MED ORDER — ENSURE PUDDING PO PUDG
1.0000 | Freq: Two times a day (BID) | ORAL | Status: DC
  Administered 2014-02-02 – 2014-02-06 (×7): 1 via ORAL

## 2014-02-01 NOTE — Progress Notes (Signed)
Trauma Service Note  Subjective: Patient is somnolent and disoriented.  On Ativan ETOH withdrawal protocol.  Objective: Vital signs in last 24 hours: Temp:  [97.4 F (36.3 C)-99.7 F (37.6 C)] 98.5 F (36.9 C) (04/13 0728) Pulse Rate:  [67-99] 99 (04/13 0800) Resp:  [8-20] 19 (04/13 0800) BP: (79-127)/(37-72) 122/72 mmHg (04/13 0800) SpO2:  [91 %-100 %] 97 % (04/13 0800) Last BM Date: 01/31/14 (colostomy)  Intake/Output from previous day: 04/12 0701 - 04/13 0700 In: 2340 [P.O.:840; I.V.:1500] Out: 1780 [Urine:1700; Stool:50; Chest Tube:30] Intake/Output this shift: Total I/O In: 75 [I.V.:75] Out: -   General: No acute distress  Lungs: Clear to auscultation.  No air leak on the CT drainage system.  CXR without PTX either side.  Abd: Soft, benign.  Good bowel sounds and is on regular diet.  Extremities: RUE in sling.  No plan for surgery   Will be treated with a sling only.  Neuro: Sleepy, disoriented  Lab Results: CBC   Recent Labs  01/29/14 1610 01/30/14 0421  WBC  --  12.3*  HGB 14.6 13.0  HCT 43.0 37.0*  PLT  --  310   BMET  Recent Labs  01/29/14 1610 01/30/14 0421  NA 131* 132*  K 4.0 4.9  CL 93* 93*  CO2  --  26  GLUCOSE 92 111*  BUN 7 8  CREATININE 1.10 0.71  CALCIUM  --  8.7   PT/INR No results found for this basename: LABPROT, INR,  in the last 72 hours ABG No results found for this basename: PHART, PCO2, PO2, HCO3,  in the last 72 hours  Studies/Results: Dg Clavicle Right  01/30/2014   CLINICAL DATA:  Clavicular fracture noted on prior chest CT  EXAM: RIGHT CLAVICLE - 2+ VIEWS  COMPARISON:  CT CHEST W/CM dated 01/29/2014  FINDINGS: There is a comminuted, mildly displaced and slightly impacted fracture involving the distal end of the right clavicle, apex cranial. There is mild widening of the acromioclavicular joint. The coracoclavicular articulation appears grossly preserved.  There is a minimally displaced fracture involving the posterior  lateral aspect of the right third rib. There is an apically directed right-sided chest tube. No pneumothorax.  IMPRESSION: 1. Comminuted, mildly displaced and slightly impacted fracture involving the distal end of the right clavicle with associated mild widening of the acromioclavicular joint. 2. Minimally displaced fracture involving the posterior lateral aspect of the right third rib.   Electronically Signed   By: Sandi Mariscal M.D.   On: 01/30/2014 15:38   Dg Chest Port 1 View  02/01/2014   CLINICAL DATA:  R PTX  EXAM: PORTABLE CHEST - 1 VIEW  COMPARISON:  DG CHEST 1V PORT dated 01/31/2014; DG CHEST 1V PORT dated 01/30/2014;  FINDINGS: The patient right-sided chest tube appreciated tip lateral right lung apex. There is no appreciable pneumothorax. Increased density in the right perihilar region again appreciated. No focal regions of consolidation or further focal infiltrates. A right clavicle fracture is partially visualized. Remaining osseous structures otherwise grossly unremarkable. Cardiac silhouette is unremarkable.  IMPRESSION: Atelectasis versus infiltrate right infrahilar region.  No appreciable residual pneumothorax  Chest tube unchanged  Known the right clavicle fracture   Electronically Signed   By: Margaree Mackintosh M.D.   On: 02/01/2014 07:53   Dg Chest Port 1 View  01/31/2014   CLINICAL DATA:  Right-sided pneumothorax  EXAM: PORTABLE CHEST - 1 VIEW  COMPARISON:  DG CHEST 1V PORT dated 01/30/2014; CT CHEST W/CM dated 01/29/2014  FINDINGS: The right-sided chest tube is unchanged in position with the tip in the pulmonary apex. A faint pleural line is visible adjacent to the tip of the tube there remains soft tissue density in the right infrahilar region. The left lung demonstrates new increased density medially at the lung base. There is also an apparent pleural line just above the left lateral costophrenic gutter consistent with a small pneumothorax amounting to less than 10% of the lung volume. A  definite apical pneumothorax on the left is not demonstrated.  The cardiac silhouette is normal in size. The pulmonary vascularity is not engorged. The distal right clavicular fracture and posterior right third rib fracture are demonstrated.  IMPRESSION: 1. There is a tiny right-sided pneumothorax visible adjacent to the tip of the right-sided chest tube. 2. There is a new small left basilar pneumothorax. Left basilar atelectasis is suspected as well. These results were called by telephone at the time of interpretation on 01/31/2014 at 10:43 AM to Vickii Penna, RN,who verbally acknowledged these results.   Electronically Signed   By: David  [place]   On: 01/31/2014 10:45    Anti-infectives: Anti-infectives   None      Assessment/Plan: s/p Procedure(s): OPEN REDUCTION INTERNAL FIXATION (ORIF) CLAVICULAR FRACTURE Advance diet Keep in ICU because of PAT, disorientation, ETOH withdrawal, chest tube in place. Beer with meals. Continue therapy. CT to waterseal.  LOS: 3 days   Kathryne Eriksson. Dahlia Bailiff, MD, FACS [phone] Trauma Surgeon 02/01/2014
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
## 2014-02-02 ENCOUNTER — Encounter (HOSPITAL_COMMUNITY): Admission: EM | Disposition: A | Discharge status: Skilled Nursing Facility | Payer: Self-pay | Source: Home / Self Care

## 2014-02-02 ENCOUNTER — Inpatient Hospital Stay (HOSPITAL_COMMUNITY): Payer: Medicaid Other

## 2014-02-02 LAB — BASIC METABOLIC PANEL
BUN: 5 mg/dL — AB (ref 6–23)
CO2: 29 mEq/L (ref 19–32)
CREATININE: 0.6 mg/dL (ref 0.50–1.35)
Calcium: 9.3 mg/dL (ref 8.4–10.5)
Chloride: 89 mEq/L — ABNORMAL LOW (ref 96–112)
Glucose, Bld: 119 mg/dL — ABNORMAL HIGH (ref 70–99)
Potassium: 4.5 mEq/L (ref 3.7–5.3)
Sodium: 130 mEq/L — ABNORMAL LOW (ref 137–147)

## 2014-02-02 LAB — CBC
HEMATOCRIT: 34.7 % — AB (ref 39.0–52.0)
Hemoglobin: 12.1 g/dL — ABNORMAL LOW (ref 13.0–17.0)
MCH: 31.9 pg (ref 26.0–34.0)
MCHC: 34.9 g/dL (ref 30.0–36.0)
MCV: 91.6 fL (ref 78.0–100.0)
Platelets: 315 10*3/uL (ref 150–400)
RBC: 3.79 MIL/uL — ABNORMAL LOW (ref 4.22–5.81)
RDW: 13 % (ref 11.5–15.5)
WBC: 10.4 10*3/uL (ref 4.0–10.5)

## 2014-02-02 SURGERY — OPEN REDUCTION INTERNAL FIXATION (ORIF) CLAVICULAR FRACTURE
Anesthesia: General | Laterality: Right

## 2014-02-02 MED ORDER — SODIUM CHLORIDE 0.9 % IV SOLN
INTRAVENOUS | Status: DC
  Administered 2014-02-02 (×2): via INTRAVENOUS

## 2014-02-02 MED ORDER — SODIUM CHLORIDE 1 G PO TABS
1.0000 g | ORAL_TABLET | Freq: Three times a day (TID) | ORAL | Status: DC
  Administered 2014-02-03 – 2014-02-06 (×11): 1 g via ORAL
  Filled 2014-02-02 (×15): qty 1

## 2014-02-02 NOTE — Progress Notes (Addendum)
Patient noted in chair.  Sitter stepped away for a moment and RN/Nurse Tech both saw patient start to get up.  Before we could run into room to get patient he fell.  Patient noted to be on his left side upon falling.  No injuries noted, however patient so confused from DT's that unable to fully ascertain if there are injuries.  Dr. Grandville Silos notified of fall.  To come and see patient shortly.  Patient assisted back to bed after fall.  Sitter back at bedside.  Daughter-in-law notified of fall.

## 2014-02-02 NOTE — Progress Notes (Signed)
Late Entry: Small bag of marijuana in patient's belonging bag was found earlier in patient's clothing this weekend and still is at the bedside. The drugs were destroyed by being flushed down the toilet, witnessed by Clerance Lav, RN.
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
## 2014-02-02 NOTE — Progress Notes (Signed)
    Subjective:  Patient reports pain as moderate, he is confused due to withdrawal   Objective:   VITALS:   Filed Vitals:   02/02/14 1300 02/02/14 1400 02/02/14 1402 02/02/14 1500  BP: 109/63 129/78  118/58  Pulse: 94 101  93  Temp:      TempSrc:      Resp: 22 22  20   Height:      Weight:      SpO2: 96% 97% 97% 91%    Physical Exam RUE: pain wwith ROM, skin benign, NVI distally   LABS  Results for orders placed during the hospital encounter of 01/29/14 (from the past 24 hour(s))  CBC     Status: Abnormal   Collection Time    02/02/14  9:05 AM      Result Value Ref Range   WBC 10.4  4.0 - 10.5 K/uL   RBC 3.79 (*) 4.22 - 5.81 MIL/uL   Hemoglobin 12.1 (*) 13.0 - 17.0 g/dL   HCT 34.7 (*) 39.0 - 52.0 %   MCV 91.6  78.0 - 100.0 fL   MCH 31.9  26.0 - 34.0 pg   MCHC 34.9  30.0 - 36.0 g/dL   RDW 13.0  11.5 - 15.5 %   Platelets 315  150 - 400 K/uL  BASIC METABOLIC PANEL     Status: Abnormal   Collection Time    02/02/14  9:05 AM      Result Value Ref Range   Sodium 130 (*) 137 - 147 mEq/L   Potassium 4.5  3.7 - 5.3 mEq/L   Chloride 89 (*) 96 - 112 mEq/L   CO2 29  19 - 32 mEq/L   Glucose, Bld 119 (*) 70 - 99 mg/dL   BUN 5 (*) 6 - 23 mg/dL   Creatinine, Ser 0.60  0.50 - 1.35 mg/dL   Calcium 9.3  8.4 - 10.5 mg/dL   GFR calc non Af Amer >90  >90 mL/min   GFR calc Af Amer >90  >90 mL/min     Assessment/Plan:   R clavicle fracture  Active Problems:   Traumatic pneumothorax   PLAN: Weight Bearing: NWB RUE, sling for comfort  NON op treatment of his clavicle fracture VTE prophylaxis: per primary team Dispo: f/u with me in 2-3wks Will sign off pls call for further questions   Renette Butters 02/02/2014, 3:52 PM   Edmonia Lynch, MD Cell [phone]

## 2014-02-02 NOTE — Progress Notes (Signed)
Patient ID: Zachary Walls, male   DOB: 09-Aug-1956, 58 y.o.   MRN: [id number] Notified by RN that PT fell trying to get up out of chair. He only C/O the condom cath. Denies new pain. No new areas of tenderness on exam. Continue sitter. Georganna Skeans, MD, MPH, FACS Trauma: [phone] General Surgery: [phone]

## 2014-02-03 ENCOUNTER — Inpatient Hospital Stay (HOSPITAL_COMMUNITY): Payer: Medicaid Other

## 2014-02-03 DIAGNOSIS — D62 Acute posthemorrhagic anemia: Secondary | ICD-10-CM | POA: Diagnosis not present

## 2014-02-03 DIAGNOSIS — E871 Hypo-osmolality and hyponatremia: Secondary | ICD-10-CM

## 2014-02-03 DIAGNOSIS — S42001A Fracture of unspecified part of right clavicle, initial encounter for closed fracture: Secondary | ICD-10-CM | POA: Diagnosis present

## 2014-02-03 DIAGNOSIS — S2241XA Multiple fractures of ribs, right side, initial encounter for closed fracture: Secondary | ICD-10-CM | POA: Diagnosis present

## 2014-02-03 LAB — CBC
HCT: 31.4 % — ABNORMAL LOW (ref 39.0–52.0)
Hemoglobin: 10.7 g/dL — ABNORMAL LOW (ref 13.0–17.0)
MCH: 31.3 pg (ref 26.0–34.0)
MCHC: 34.1 g/dL (ref 30.0–36.0)
MCV: 91.8 fL (ref 78.0–100.0)
Platelets: 318 10*3/uL (ref 150–400)
RBC: 3.42 MIL/uL — ABNORMAL LOW (ref 4.22–5.81)
RDW: 13.2 % (ref 11.5–15.5)
WBC: 8.7 10*3/uL (ref 4.0–10.5)

## 2014-02-03 LAB — URINALYSIS, ROUTINE W REFLEX MICROSCOPIC
BILIRUBIN URINE: NEGATIVE
Glucose, UA: NEGATIVE mg/dL
Hgb urine dipstick: NEGATIVE
KETONES UR: 15 mg/dL — AB
Leukocytes, UA: NEGATIVE
NITRITE: POSITIVE — AB
PH: 7 (ref 5.0–8.0)
PROTEIN: NEGATIVE mg/dL
Specific Gravity, Urine: 1.013 (ref 1.005–1.030)
Urobilinogen, UA: 1 mg/dL (ref 0.0–1.0)

## 2014-02-03 LAB — BASIC METABOLIC PANEL
BUN: 8 mg/dL (ref 6–23)
CALCIUM: 8.8 mg/dL (ref 8.4–10.5)
CO2: 26 mEq/L (ref 19–32)
Chloride: 94 mEq/L — ABNORMAL LOW (ref 96–112)
Creatinine, Ser: 0.59 mg/dL (ref 0.50–1.35)
GFR calc non Af Amer: >90 mL/min (ref >90)
Glucose, Bld: 91 mg/dL (ref 70–99)
POTASSIUM: 4.1 meq/L (ref 3.7–5.3)
SODIUM: 133 meq/L — AB (ref 137–147)

## 2014-02-03 LAB — URINE MICROSCOPIC-ADD ON

## 2014-02-03 MED ORDER — TRAMADOL HCL 50 MG PO TABS
50.0000 mg | ORAL_TABLET | Freq: Four times a day (QID) | ORAL | Status: DC | PRN
  Administered 2014-02-04: 50 mg via ORAL
  Administered 2014-02-05 (×2): 100 mg via ORAL
  Administered 2014-02-06 (×2): 50 mg via ORAL
  Filled 2014-02-03: qty 2
  Filled 2014-02-03 (×2): qty 1
  Filled 2014-02-03: qty 2
  Filled 2014-02-03: qty 1

## 2014-02-03 MED ORDER — CIPROFLOXACIN HCL 500 MG PO TABS
500.0000 mg | ORAL_TABLET | Freq: Two times a day (BID) | ORAL | Status: DC
  Administered 2014-02-03 – 2014-02-06 (×6): 500 mg via ORAL
  Filled 2014-02-03 (×9): qty 1
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
## 2014-02-03 NOTE — Plan of Care (Signed)
Problem: Phase I Progression Outcomes Goal: Initial discharge plan identified Outcome: Progressing Lives alone.  Will likely need SNF upon discharge vs. Rehab

## 2014-02-03 NOTE — Progress Notes (Signed)
Seen and agree  
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
## 2014-02-04 LAB — BASIC METABOLIC PANEL
BUN: 8 mg/dL (ref 6–23)
CALCIUM: 8.8 mg/dL (ref 8.4–10.5)
CO2: 27 meq/L (ref 19–32)
Chloride: 95 mEq/L — ABNORMAL LOW (ref 96–112)
Creatinine, Ser: 0.68 mg/dL (ref 0.50–1.35)
GFR calc Af Amer: >90 mL/min (ref >90)
GFR calc non Af Amer: >90 mL/min (ref >90)
Glucose, Bld: 93 mg/dL (ref 70–99)
Potassium: 3.7 mEq/L (ref 3.7–5.3)
SODIUM: 137 meq/L (ref 137–147)

## 2014-02-04 LAB — CBC
HEMATOCRIT: 29.9 % — AB (ref 39.0–52.0)
HEMOGLOBIN: 10.2 g/dL — AB (ref 13.0–17.0)
MCH: 31.2 pg (ref 26.0–34.0)
MCHC: 34.1 g/dL (ref 30.0–36.0)
MCV: 91.4 fL (ref 78.0–100.0)
Platelets: 365 10*3/uL (ref 150–400)
RBC: 3.27 MIL/uL — ABNORMAL LOW (ref 4.22–5.81)
RDW: 13.2 % (ref 11.5–15.5)
WBC: 8.2 10*3/uL (ref 4.0–10.5)

## 2014-02-04 MED ORDER — SPIRITUS FRUMENTI
1.0000 | Freq: Two times a day (BID) | ORAL | Status: DC
  Administered 2014-02-04: 1 via ORAL
  Filled 2014-02-04 (×4): qty 1

## 2014-02-04 MED ORDER — ALBUTEROL SULFATE (2.5 MG/3ML) 0.083% IN NEBU: 2.5000 mg | INHALATION_SOLUTION | Freq: Four times a day (QID) | RESPIRATORY_TRACT | Status: DC

## 2014-02-04 MED ORDER — ALBUTEROL SULFATE (2.5 MG/3ML) 0.083% IN NEBU
2.5000 mg | INHALATION_SOLUTION | Freq: Four times a day (QID) | RESPIRATORY_TRACT | Status: DC | PRN
  Administered 2014-02-05 – 2014-02-06 (×2): 2.5 mg via RESPIRATORY_TRACT
  Filled 2014-02-04 (×2): qty 3

## 2014-02-04 NOTE — Progress Notes (Signed)
UR completed. Needs SNF at d/c. CSW aware.  Sandi Mariscal, RN BSN [place] CCM Trauma/Neuro ICU Case Manager [phone]
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
## 2014-02-04 NOTE — Progress Notes (Signed)
Patient irritable, cursing and asking for cigarettes.  Patient refuses to keep his right arm in sling in spite of nurse and NT constantly reapplying and repositioning it.  Ativan 2 mg IV given.
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
## 2014-02-04 NOTE — Progress Notes (Signed)
Report  Called to [place], RN, all questions answered.  No complaints.

## 2014-02-04 NOTE — Progress Notes (Signed)
Orthopedic Tech Progress Note Patient Details:  Zachary Walls 1956-10-13 [id number] [employer] Type of Ortho Device: Shoulder immobilizer Ortho Device/Splint Location: Right LE Ortho Device/Splint Interventions: Application   Asia R Thompson 02/04/2014, 2:46 PM

## 2014-02-05 LAB — CBC
HCT: 32.1 % — ABNORMAL LOW (ref 39.0–52.0)
Hemoglobin: 10.8 g/dL — ABNORMAL LOW (ref 13.0–17.0)
MCH: 31.3 pg (ref 26.0–34.0)
MCHC: 33.6 g/dL (ref 30.0–36.0)
MCV: 93 fL (ref 78.0–100.0)
Platelets: 441 10*3/uL — ABNORMAL HIGH (ref 150–400)
RBC: 3.45 MIL/uL — AB (ref 4.22–5.81)
RDW: 13.4 % (ref 11.5–15.5)
WBC: 8.2 10*3/uL (ref 4.0–10.5)

## 2014-02-05 LAB — BASIC METABOLIC PANEL
BUN: 9 mg/dL (ref 6–23)
CALCIUM: 9.2 mg/dL (ref 8.4–10.5)
CO2: 27 meq/L (ref 19–32)
CREATININE: 0.64 mg/dL (ref 0.50–1.35)
Chloride: 97 mEq/L (ref 96–112)
GFR calc non Af Amer: >90 mL/min (ref >90)
Glucose, Bld: 105 mg/dL — ABNORMAL HIGH (ref 70–99)
Potassium: 3.7 mEq/L (ref 3.7–5.3)
SODIUM: 135 meq/L — AB (ref 137–147)

## 2014-02-05 MED ORDER — DIAZEPAM 5 MG PO TABS
5.0000 mg | ORAL_TABLET | Freq: Three times a day (TID) | ORAL | Status: DC
  Administered 2014-02-05 – 2014-02-06 (×5): 5 mg via ORAL
  Filled 2014-02-05 (×5): qty 1

## 2014-02-05 MED ORDER — SPIRITUS FRUMENTI
1.0000 | Freq: Every day | ORAL | Status: AC
  Administered 2014-02-05: 1 via ORAL
  Filled 2014-02-05: qty 1

## 2014-02-05 NOTE — Progress Notes (Signed)
Patient constantly talking, but does not look like he is actively withdrawing.  This patient has been seen and I agree with the findings and treatment plan.  Kathryne Eriksson. Dahlia Bailiff, MD, [place] [phone] (pager) [phone] (direct pager) Trauma Surgeon

## 2014-02-05 NOTE — Clinical Social Work Placement (Addendum)
Clinical Social Work Department CLINICAL SOCIAL WORK PLACEMENT NOTE 02/05/2014  Patient:  Zachary Walls, Zachary Walls  Account Number:  [id number] Admit date:  01/29/2014  Clinical Social Worker:  Barbette Or, LCSW  Date/time:  02/05/2014 12:40 PM  Clinical Social Work is seeking post-discharge placement for this patient at the following level of care:   SKILLED NURSING   (*CSW will update this form in Epic as items are completed)   02/05/2014  Patient/family provided with [place] Department of Clinical Social Work's list of facilities offering this level of care within the geographic area requested by the patient (or if unable, by the patient's family).  02/05/2014  Patient/family informed of their freedom to choose among providers that offer the needed level of care, that participate in Medicare, Medicaid or managed care program needed by the patient, have an available bed and are willing to accept the patient.  02/05/2014  Patient/family informed of [facility]' ownership interest in [facility], as well as of the fact that they are under no obligation to receive care at this facility.  PASARR submitted to E[facility] on  PASARR number received from [place] on   FL2 transmitted to all facilities in geographic area requested by pt/family on  02/05/2014 FL2 transmitted to all facilities within larger geographic area on   Patient informed that his/her managed care company has contracts with or will negotiate with  certain facilities, including the following:     Patient/family informed of bed offers received:  02/05/2014 Patient chooses bed at [facility] [place] Physician recommends and patient chooses bed at    Patient to be transferred to [facility] on 02/06/14- [facility], [place]   Patient to be transferred to facility by [place]   The following physician request were entered in Epic:   Additional Comments: 04/17 Pre existing  PASARR
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
## 2014-02-05 NOTE — Clinical Social Work Note (Signed)
Clinical Social Worker received return phone call from patient daughter, Sharyn Lull [phone]) who states that she is in agreement with patient returning to [facility] once medically ready.  She is able to bring patient his clothing by tomorrow.  Patient daughter expressed concerns that if patient is discharged over the weekend she will not be able to provide transportation - CSW to further explore options if patient medically ready over the weekend.  Facility has offered a bed and is able to accept over the weekend - PA aware.  CSW remains available for support and to facilitate patient discharge needs.  Barbette Or, [place]

## 2014-02-05 NOTE — Progress Notes (Signed)
Patient ID: Zachary Walls, male   DOB: [birth year], 58 y.o.   MRN: [id number]   LOS: 7 days   Subjective: Mildly confused, loquacious   Objective: Vital signs in last 24 hours: Temp:  [98.4 F (36.9 C)-99.2 F (37.3 C)] 98.6 F (37 C) (04/17 0619) Pulse Rate:  [92-121] 94 (04/17 0619) Resp:  [18-38] 20 (04/17 0619) BP: (100-114)/(54-77) 114/60 mmHg (04/17 0619) SpO2:  [92 %-100 %] 93 % (04/17 0619) Last BM Date: 02/04/14   Laboratory  CBC  Recent Labs  02/04/14 0222 02/05/14 0617  WBC 8.2 8.2  HGB 10.2* 10.8*  HCT 29.9* 32.1*  PLT 365 441*   BMET  Recent Labs  02/04/14 0222 02/05/14 0617  NA 137 135*  K 3.7 3.7  CL 95* 97  CO2 27 27  GLUCOSE 93 105*  BUN 8 9  CREATININE 0.68 0.64  CALCIUM 8.8 9.2    Physical Exam General appearance: alert and no distress Resp: rhonchi bilaterally Cardio: regular rate and rhythm GI: normal findings: bowel sounds normal and soft, non-tender   Assessment/Plan: [facility]  R rib FX and PTX - CXR w/o recurrent PTX after CT d/c'd  R clavicle FX - Non-operative, NWB in sling  ABL anemia -- Stable ETOH withdrawal - protocol + beer, will need to d/c beer for SNF placement, weaning off. Will add scheduled valium as pt still requiring frequent IV Ativan. UTI -- Cipro D3/7 FEN -  VTE - SCD's, Lovenox  Dispo - PT/OT recommend SNF, will need to wean beer and sitter over weekend    Lisette Abu, PA-C Pager: [phone] General Trauma PA Pager: [phone]  02/05/2014
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
## 2014-02-06 MED ORDER — ENSURE COMPLETE PO LIQD: 237.0000 mL | Freq: Two times a day (BID) | ORAL | Status: DC

## 2014-02-06 MED ORDER — LORAZEPAM 0.5 MG PO TABS: 0.5000 mg | ORAL_TABLET | Freq: Three times a day (TID) | ORAL | Status: DC | PRN

## 2014-02-06 MED ORDER — DSS 100 MG PO CAPS: 100.0000 mg | ORAL_CAPSULE | Freq: Two times a day (BID) | ORAL | Status: DC

## 2014-02-06 MED ORDER — CIPROFLOXACIN HCL 500 MG PO TABS: 500.0000 mg | ORAL_TABLET | Freq: Two times a day (BID) | ORAL | Status: DC

## 2014-02-06 MED ORDER — DIAZEPAM 5 MG PO TABS: 5.0000 mg | ORAL_TABLET | Freq: Three times a day (TID) | ORAL | Status: DC

## 2014-02-06 MED ORDER — TRAMADOL HCL 50 MG PO TABS: 50.0000 mg | ORAL_TABLET | Freq: Four times a day (QID) | ORAL | Status: DC | PRN

## 2014-02-06 NOTE — Discharge Planning (Signed)
At 1000, weekend CSW notified of dc back to SNF order.

## 2014-02-06 NOTE — Progress Notes (Signed)
Pt for transport to [facility] via EMS.  Called Elsa at [facility] and gave her an update.

## 2014-02-06 NOTE — Progress Notes (Signed)
Gave pt a PRN albuterol neb tx prior to leaving.  Pts sats up to 91% on room air.
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
## 2014-02-06 NOTE — Discharge Summary (Signed)
Eric M. Wilson, MD, FACS General, Bariatric, & Minimally Invasive Surgery [facility]  

## 2014-02-06 NOTE — Progress Notes (Signed)
Pt ready for discharge to [facility].  Report given to Enid Derry, nurse for [facility].  Pt for transport via EMS.

## 2014-02-06 NOTE — Progress Notes (Signed)
Patient is medically stable for D/C back to [facility] today. Per weekend supervisor at [facility] patient is going to room 141-A. Clinical Education officer, museum (CSW) faxed D/C summary to [facility] CSW prepared D/C packet and arranged non-emergency EMS ([facility]) for transport. CSW left messages with patient two daughters and son. CSW also contacted patient's grandaughter and made her aware of above. Please reconsult if further social work needs arise. CSW signing off.   Blima Rich, [place] Weekend CSW [phone]

## 2014-02-08 ENCOUNTER — Other Ambulatory Visit: Payer: Self-pay | Admitting: *Deleted

## 2014-02-08 MED ORDER — LORAZEPAM 0.5 MG PO TABS: ORAL_TABLET | ORAL | Status: DC

## 2014-02-08 MED ORDER — TRAMADOL HCL 50 MG PO TABS: ORAL_TABLET | ORAL | Status: DC

## 2014-02-08 MED ORDER — DIAZEPAM 5 MG PO TABS: 5.0000 mg | ORAL_TABLET | Freq: Three times a day (TID) | ORAL | Status: DC

## 2014-02-08 NOTE — Telephone Encounter (Signed)
[facility] [place] 

## 2014-03-18 IMAGING — CR DG CHEST 1V PORT
1 series · 1 of 1 positions shown · non-contrast
Comparison: One-view chest 02/12/2013.

CLINICAL DATA: New tracheostomy tube placement.  Rule out
pneumothorax.

PORTABLE CHEST - 1 VIEW

[AP]
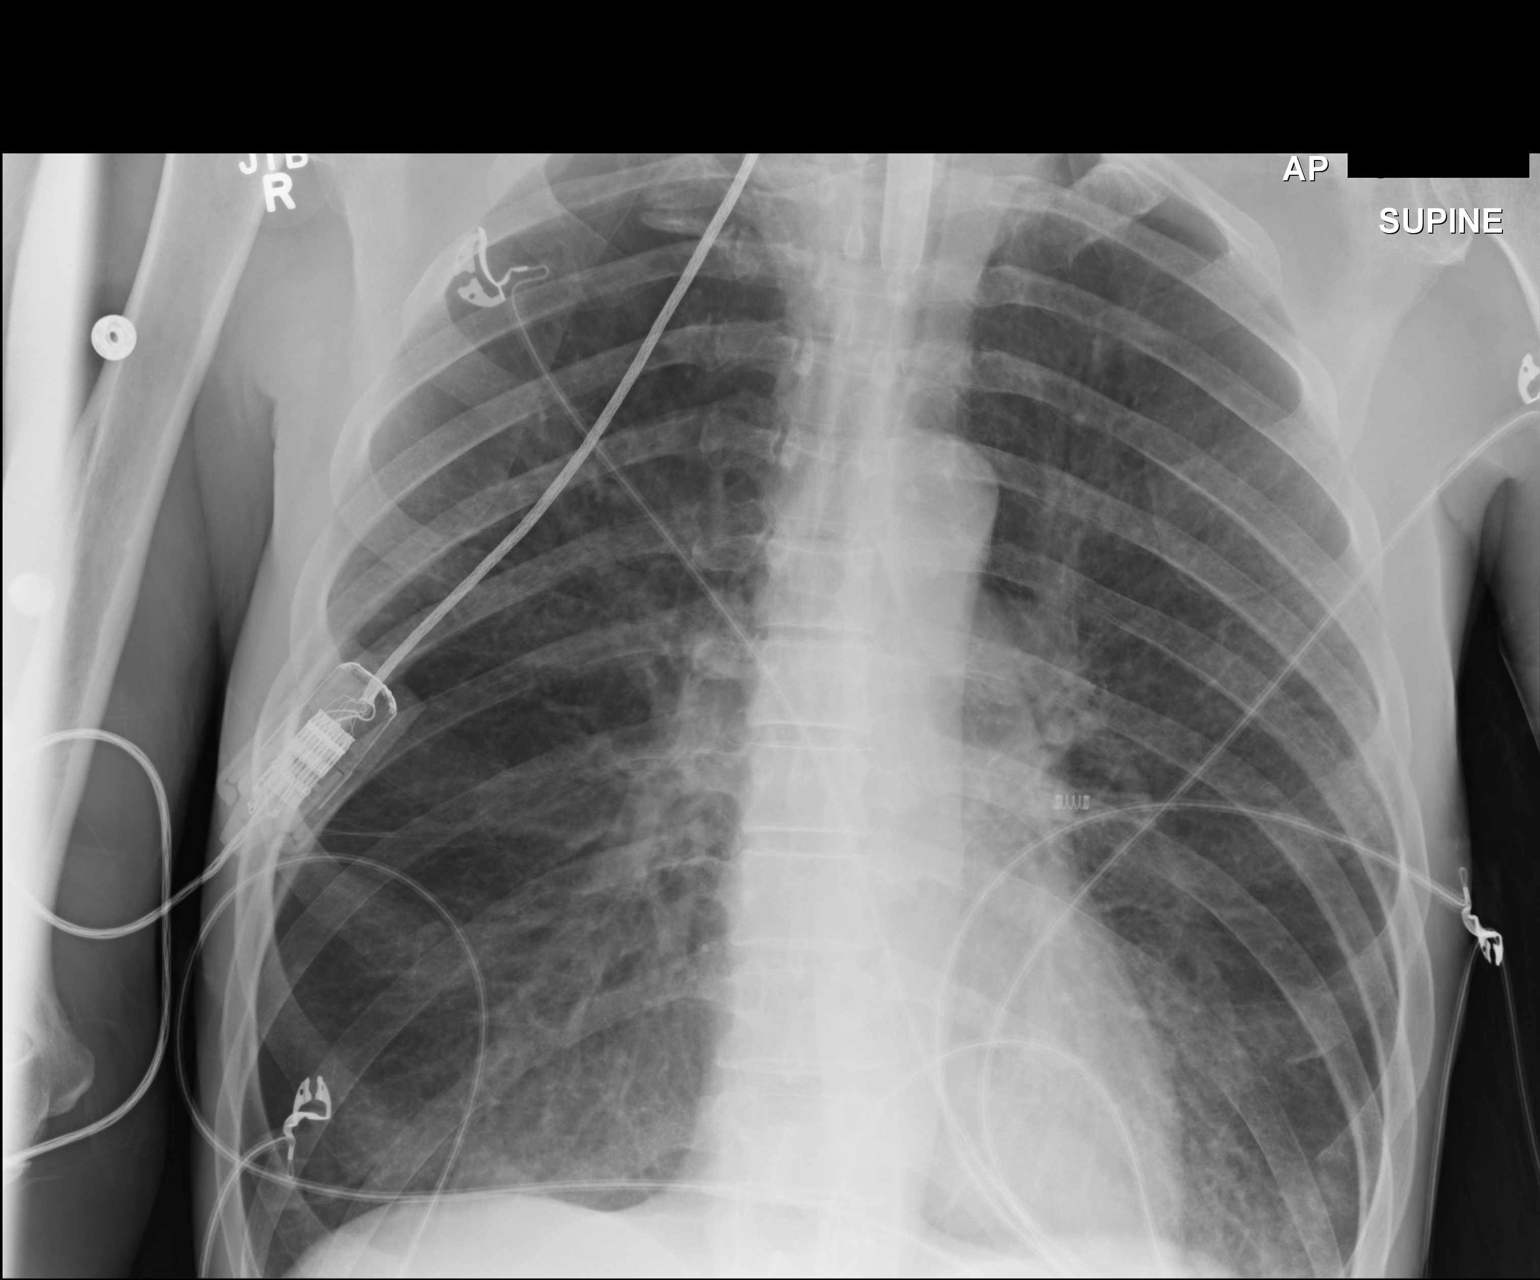

[1 of 1 positions shown; findings below may reference images not displayed]

FINDINGS: A tracheostomy has been placed.  The tip is 7.5 cm above
the carina.  Heart size is normal.  Mild interstitial disease is
evident at both lung bases, left greater than right.  The upper
lung fields are clear.  The visualized soft tissues and bony thorax
are unremarkable.  There is no pneumothorax.
IMPRESSION: 1.  Satisfactory positioning of the tracheostomy tube without
evidence for pneumothorax.
2.  Persistent interstitial and airspace disease at the lung bases
bilaterally, left greater than right.  This remains concerning for
infection or less likely edema.

## 2014-03-18 IMAGING — CR DG CHEST 1V PORT
2 series · 2 of 2 positions shown · non-contrast
Comparison: Portable exam 2566 hours compared to 02/11/2013

CLINICAL DATA: COPD, hypertension, evaluate endotracheal tube
position

PORTABLE CHEST - 1 VIEW

[AP (1 of 2)]
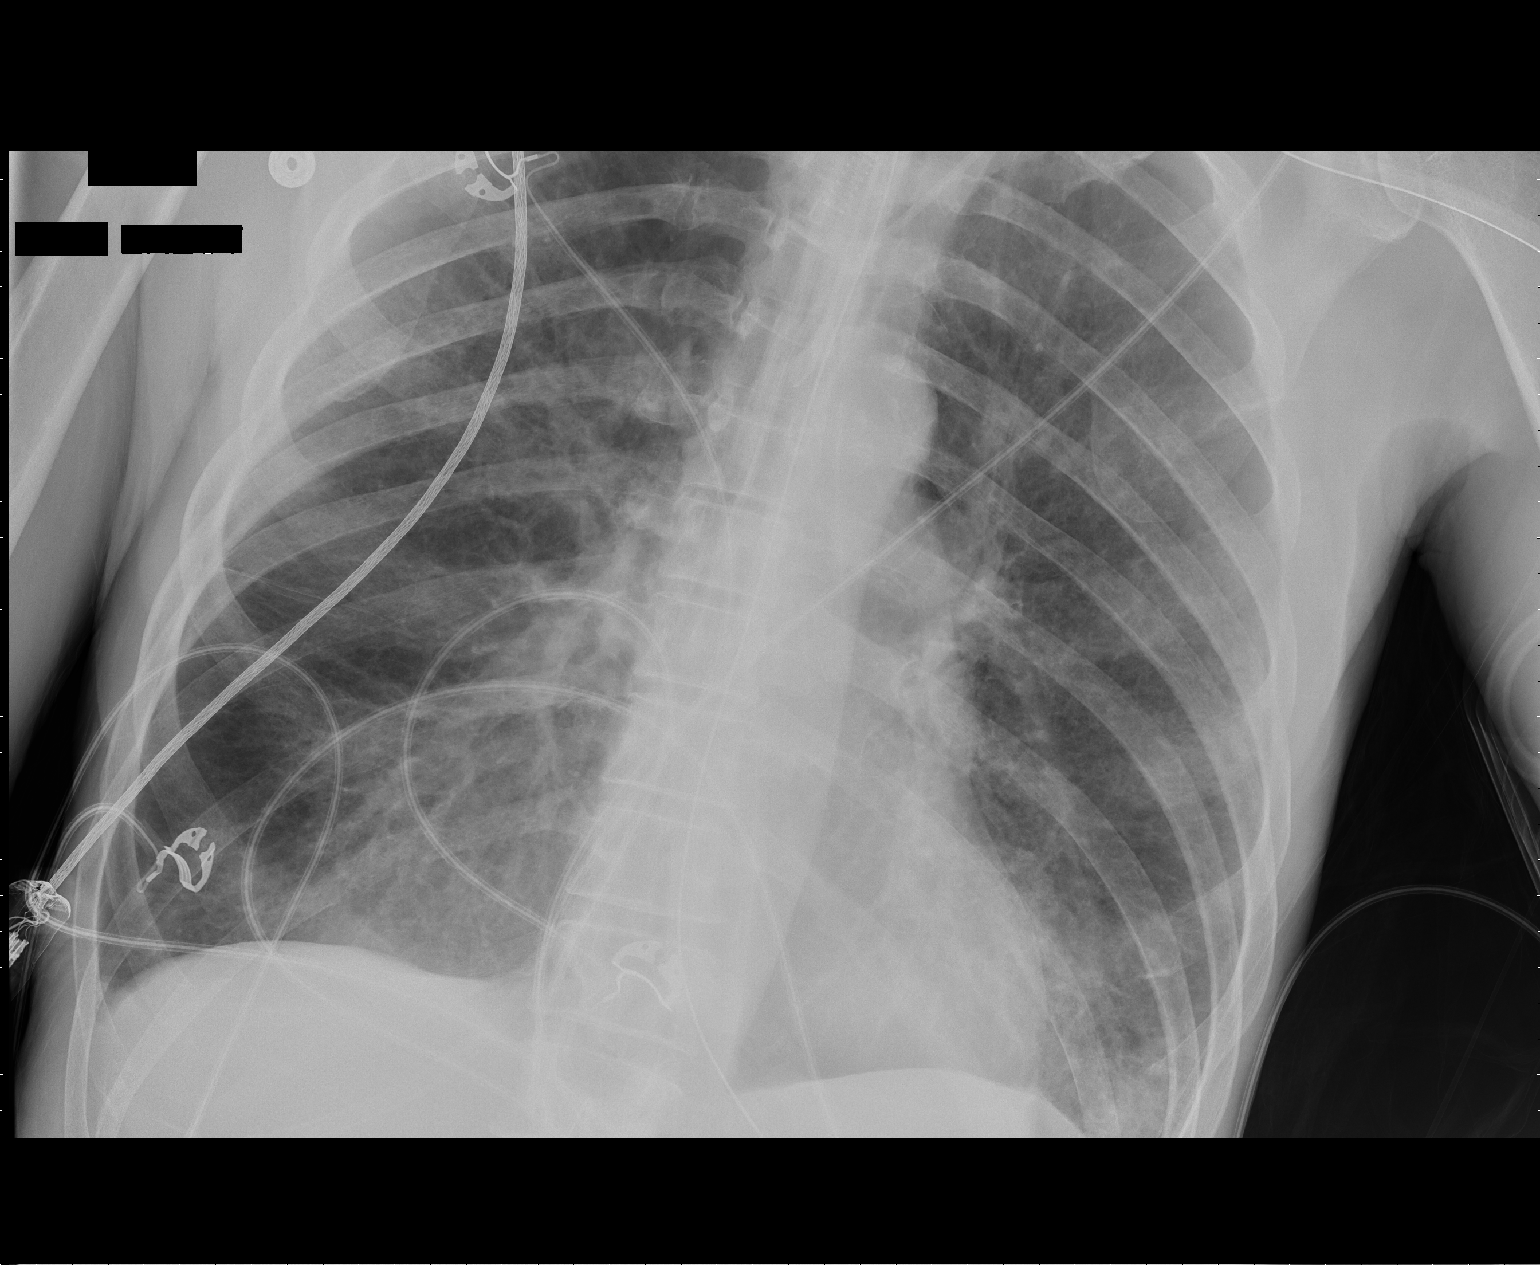

[AP (2 of 2)]
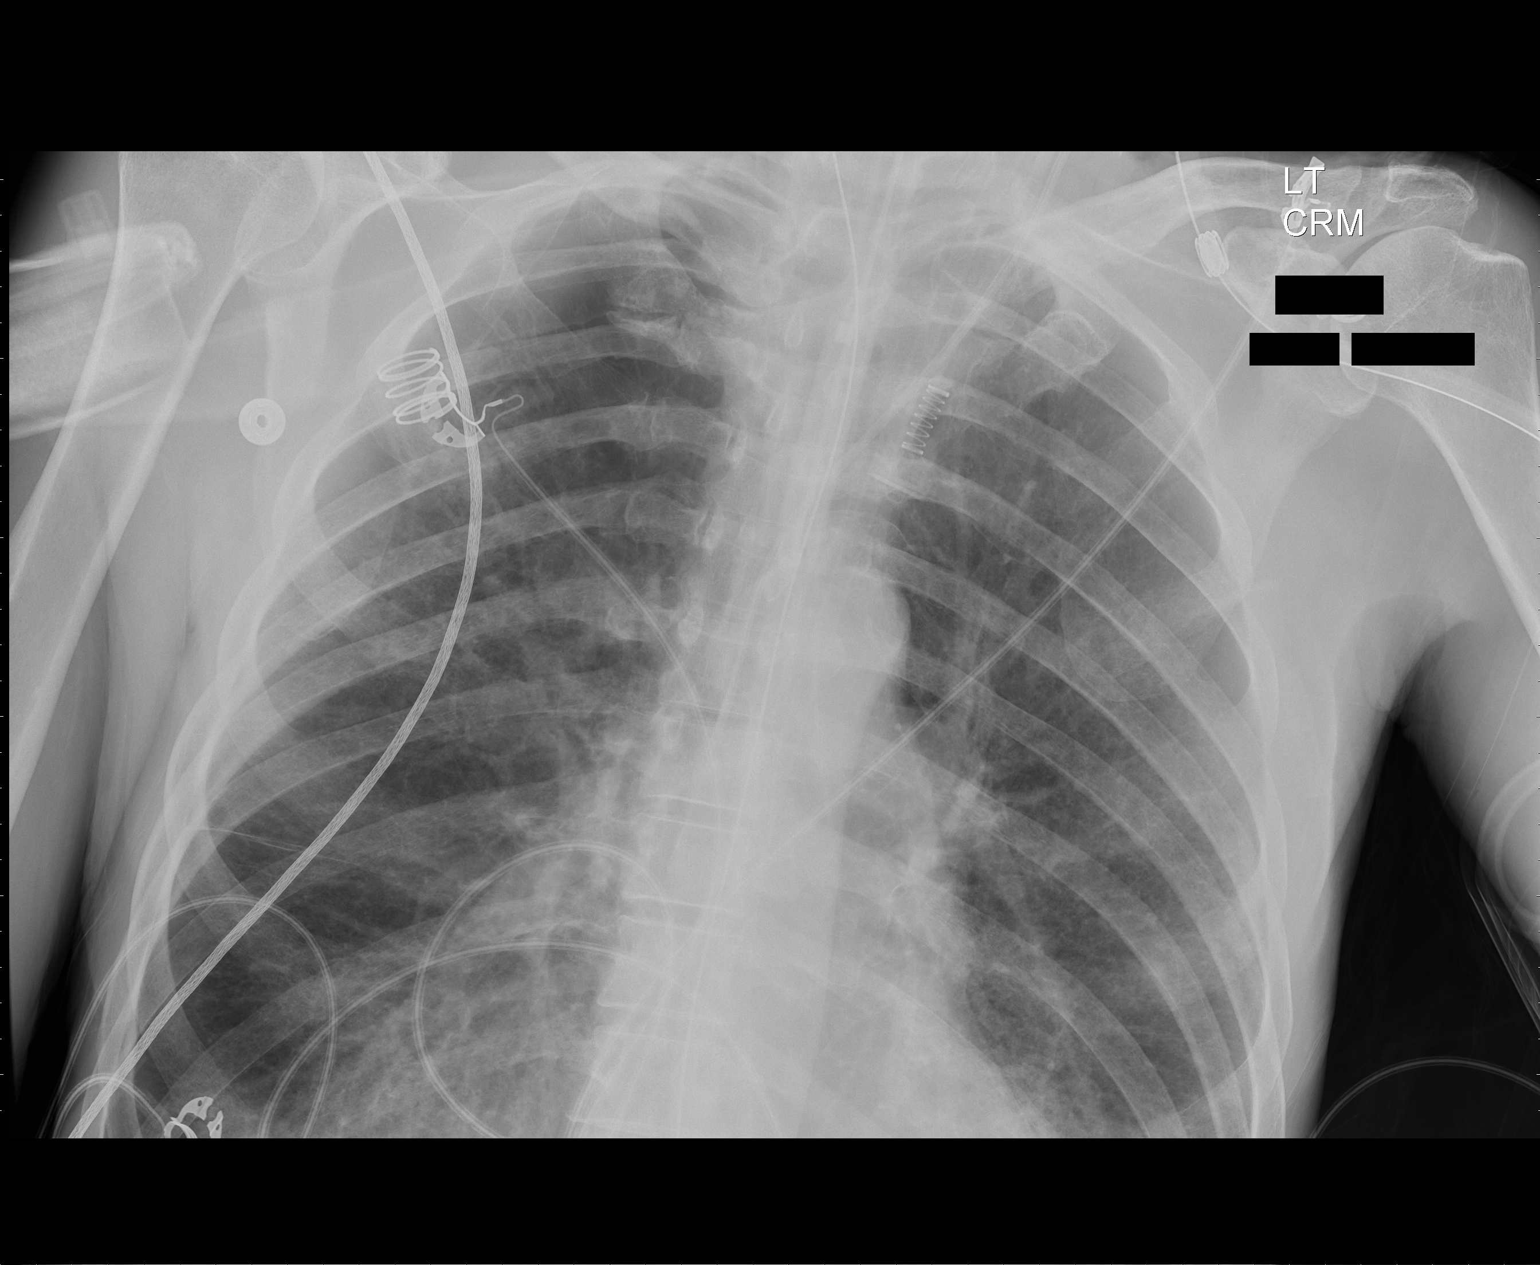

[2 of 2 positions shown; findings below may reference images not displayed]

FINDINGS: Tip of endotracheal tube projects 4.2 cm above carina.
Nasogastric tube extends into abdomen.
Left jugular central venous catheter tip projects over SVC.
Normal heart size, mediastinal contours, and pulmonary vascularity.
Emphysematous changes with bilateral perihilar basilar infiltrates,
question pulmonary edema less likely infection.
No gross pleural effusion or pneumothorax.
IMPRESSION: COPD with superimposed pulmonary infiltrates, question pulmonary
edema or less likely infection.
When compared to previous exam, aeration in the left lung is
slightly improved.

## 2014-03-20 IMAGING — CR DG CHEST 1V PORT
2 series · 2 of 2 positions shown · non-contrast
Comparison: 02/12/2013

CLINICAL DATA: Hypoxia, confusion

PORTABLE CHEST - 1 VIEW

[AP (1 of 2)]
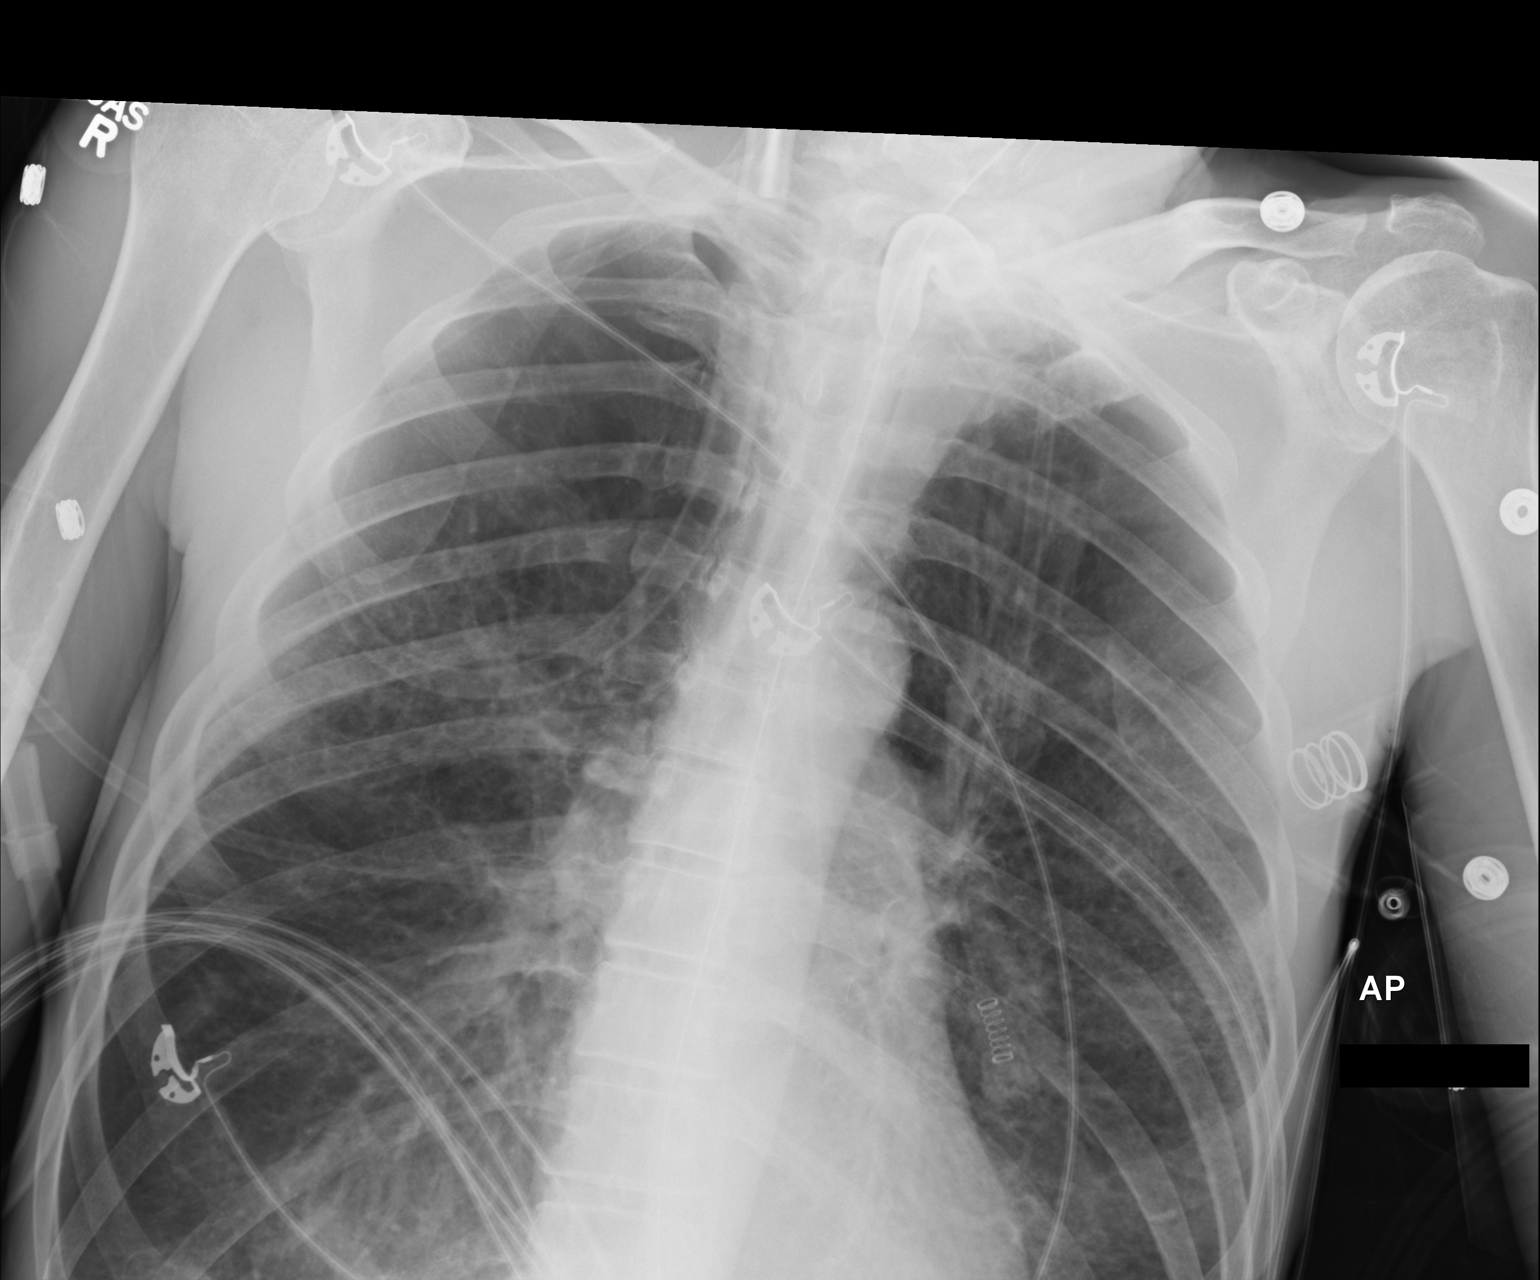

[AP (2 of 2)]
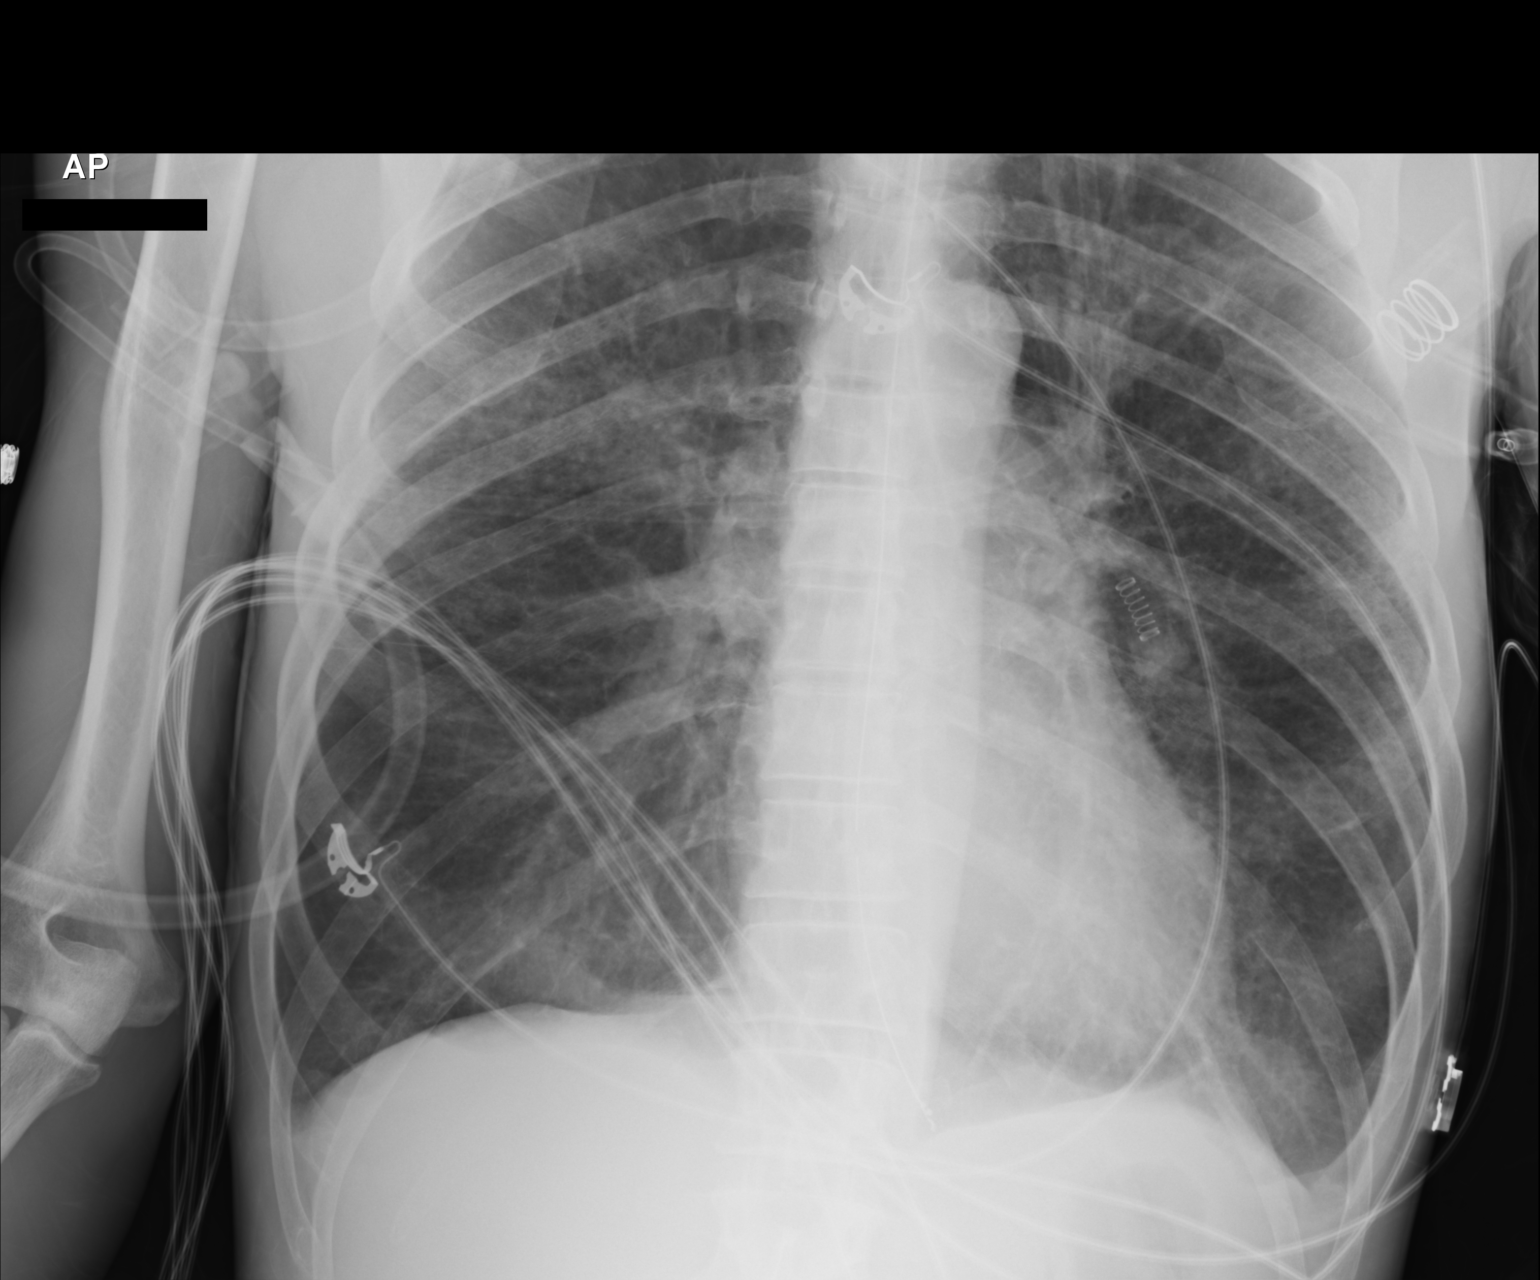

[2 of 2 positions shown; findings below may reference images not displayed]

FINDINGS: Tracheostomy remains appropriately positioned.
Nasogastric tube tip terminates at the level of the GE junction and
could be advanced approximately 10 cm for more optimal positioning.
Hyperinflation and aeration may indicate emphysema.  No
pneumothorax.  Slight increase in coarse patchy right upper lobe
greater than left lung markings. No pleural effusion.  Heart size
is mildly enlarged.
IMPRESSION: Slight increase in course and lung markings over the bilateral
upper lobes.  Differential considerations include developing
pneumonia, aspiration, or other alveolar filling processes such as
edema or hemorrhage.

## 2014-03-25 ENCOUNTER — Encounter (HOSPITAL_COMMUNITY): Payer: Self-pay | Admitting: Emergency Medicine

## 2014-03-25 ENCOUNTER — Emergency Department (HOSPITAL_COMMUNITY)
Admission: EM | Admit: 2014-03-25 | Discharge: 2014-03-25 | Disposition: A | Discharge status: Home / Self Care | Payer: Medicaid Other | Attending: Emergency Medicine | Admitting: Emergency Medicine

## 2014-03-25 DIAGNOSIS — Y9389 Activity, other specified: Secondary | ICD-10-CM | POA: Insufficient documentation

## 2014-03-25 DIAGNOSIS — Z792 Long term (current) use of antibiotics: Secondary | ICD-10-CM | POA: Insufficient documentation

## 2014-03-25 DIAGNOSIS — H9190 Unspecified hearing loss, unspecified ear: Secondary | ICD-10-CM | POA: Insufficient documentation

## 2014-03-25 DIAGNOSIS — J449 Chronic obstructive pulmonary disease, unspecified: Secondary | ICD-10-CM | POA: Insufficient documentation

## 2014-03-25 DIAGNOSIS — J4489 Other specified chronic obstructive pulmonary disease: Secondary | ICD-10-CM | POA: Insufficient documentation

## 2014-03-25 DIAGNOSIS — Z433 Encounter for attention to colostomy: Secondary | ICD-10-CM | POA: Insufficient documentation

## 2014-03-25 DIAGNOSIS — Y92410 Unspecified street and highway as the place of occurrence of the external cause: Secondary | ICD-10-CM | POA: Insufficient documentation

## 2014-03-25 DIAGNOSIS — Z8639 Personal history of other endocrine, nutritional and metabolic disease: Secondary | ICD-10-CM | POA: Insufficient documentation

## 2014-03-25 DIAGNOSIS — S93409A Sprain of unspecified ligament of unspecified ankle, initial encounter: Secondary | ICD-10-CM

## 2014-03-25 DIAGNOSIS — I10 Essential (primary) hypertension: Secondary | ICD-10-CM | POA: Insufficient documentation

## 2014-03-25 DIAGNOSIS — Z8719 Personal history of other diseases of the digestive system: Secondary | ICD-10-CM | POA: Insufficient documentation

## 2014-03-25 DIAGNOSIS — Z9889 Other specified postprocedural states: Secondary | ICD-10-CM | POA: Insufficient documentation

## 2014-03-25 DIAGNOSIS — F17200 Nicotine dependence, unspecified, uncomplicated: Secondary | ICD-10-CM | POA: Insufficient documentation

## 2014-03-25 DIAGNOSIS — I2510 Atherosclerotic heart disease of native coronary artery without angina pectoris: Secondary | ICD-10-CM | POA: Insufficient documentation

## 2014-03-25 DIAGNOSIS — Z79899 Other long term (current) drug therapy: Secondary | ICD-10-CM | POA: Insufficient documentation

## 2014-03-25 DIAGNOSIS — Z862 Personal history of diseases of the blood and blood-forming organs and certain disorders involving the immune mechanism: Secondary | ICD-10-CM | POA: Insufficient documentation

## 2014-03-25 DIAGNOSIS — Z8701 Personal history of pneumonia (recurrent): Secondary | ICD-10-CM | POA: Insufficient documentation

## 2014-03-25 MED ORDER — LORAZEPAM 0.5 MG PO TABS: ORAL_TABLET | ORAL | Status: DC

## 2014-03-25 MED ORDER — TRAMADOL HCL 50 MG PO TABS: ORAL_TABLET | ORAL | Status: DC

## 2014-03-25 MED ORDER — TRAMADOL HCL 50 MG PO TABS
50.0000 mg | ORAL_TABLET | Freq: Once | ORAL | Status: AC
  Administered 2014-03-25: 50 mg via ORAL
  Filled 2014-03-25: qty 1

## 2014-03-25 MED ORDER — DIAZEPAM 5 MG PO TABS
5.0000 mg | ORAL_TABLET | Freq: Once | ORAL | Status: AC
  Administered 2014-03-25: 5 mg via ORAL
  Filled 2014-03-25: qty 1
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
## 2014-03-25 NOTE — ED Notes (Signed)
Patient being verbally abusive to staff.  Assisted in cleaning feces off patient and putting patient in paper scrubs.  Patient eating donuts yelling, won't stay in room.  Patient would not lie down to get colostomy on.  Continues to be abusive to staff.  Security called to bedside.  Patient cursing and screaming.  PA notified, pain medication and sedative ordered.
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
## 2014-03-25 NOTE — Discharge Instructions (Signed)
Ankle Sprain °An ankle sprain is an injury to the strong, fibrous tissues (ligaments) that hold the bones of your ankle joint together.  °CAUSES °An ankle sprain is usually caused by a fall or by twisting your ankle. Ankle sprains most commonly occur when you step on the outer edge of your foot, and your ankle turns inward. People who participate in sports are more prone to these types of injuries.  °SYMPTOMS  °· Pain in your ankle. The pain may be present at rest or only when you are trying to stand or walk. °· Swelling. °· Bruising. Bruising may develop immediately or within 1 to 2 days after your injury. °· Difficulty standing or walking, particularly when turning corners or changing directions. °DIAGNOSIS  °Your caregiver will ask you details about your injury and perform a physical exam of your ankle to determine if you have an ankle sprain. During the physical exam, your caregiver will press on and apply pressure to specific areas of your foot and ankle. Your caregiver will try to move your ankle in certain ways. An X-ray exam may be done to be sure a bone was not broken or a ligament did not separate from one of the bones in your ankle (avulsion fracture).  °TREATMENT  °Certain types of braces can help stabilize your ankle. Your caregiver can make a recommendation for this. Your caregiver may recommend the use of medicine for pain. If your sprain is severe, your caregiver may refer you to a surgeon who helps to restore function to parts of your skeletal system (orthopedist) or a physical therapist. °HOME CARE INSTRUCTIONS  °· Apply ice to your injury for 1 2 days or as directed by your caregiver. Applying ice helps to reduce inflammation and pain. °· Put ice in a plastic bag. °· Place a towel between your skin and the bag. °· Leave the ice on for 15-20 minutes at a time, every 2 hours while you are awake. °· Only take over-the-counter or prescription medicines for pain, discomfort, or fever as directed by  your caregiver. °· Elevate your injured ankle above the level of your heart as much as possible for 2 3 days. °· If your caregiver recommends crutches, use them as instructed. Gradually put weight on the affected ankle. Continue to use crutches or a cane until you can walk without feeling pain in your ankle. °· If you have a plaster splint, wear the splint as directed by your caregiver. Do not rest it on anything harder than a pillow for the first 24 hours. Do not put weight on it. Do not get it wet. You may take it off to take a shower or bath. °· You may have been given an elastic bandage to wear around your ankle to provide support. If the elastic bandage is too tight (you have numbness or tingling in your foot or your foot becomes cold and blue), adjust the bandage to make it comfortable. °· If you have an air splint, you may blow more air into it or let air out to make it more comfortable. You may take your splint off at night and before taking a shower or bath. Wiggle your toes in the splint several times per day to decrease swelling. °SEEK MEDICAL CARE IF:  °· You have rapidly increasing bruising or swelling. °· Your toes feel extremely cold or you lose feeling in your foot. °· Your pain is not relieved with medicine. °SEEK IMMEDIATE MEDICAL CARE IF: °· Your toes are numb   or blue.  You have severe pain that is increasing. MAKE SURE YOU:   Understand these instructions.  Will watch your condition.  Will get help right away if you are not doing well or get worse. Document Released: 10/08/2005 Document Revised: 07/02/2012 Document Reviewed: 10/20/2011 [facility] Patient Information 2014 [place], [place].  Colostomy Home Guide A colostomy is an opening for stool to leave your body when a medical condition prevents it from leaving through the usual opening (rectum). During a surgery, a piece of large intestine (colon) is brought through a hole in the abdominal wall. The new opening is called a stoma or  ostomy. A bag or pouch fits over the stoma to catch stool and gas. Your stool may be liquid, somewhat pasty, or formed. CARING FOR YOUR STOMA  Normally, the stoma looks a lot like the inside of your cheek: pink, red, and moist. At first it may be swollen, but this swelling will decrease within 6 weeks. Keep the skin around your stoma clean and dry. You can gently wash your stoma and the skin around your stoma in the shower with a clean, soft washcloth. If you develop any skin irritation, your caregiver may give you a stoma powder or ointment to help heal the area. Do not use any products other than those specifically given to you by your caregiver.  Your stoma should not be uncomfortable. If you notice any stinging or burning, your pouch may be leaking, and the skin around your stoma may be coming into contact with stool. This can cause skin irritation. If you notice stinging, replace your pouch with a new one and discard the old one. OSTOMY POUCHES  The pouch that fits over the ostomy can be made up of either 1 or 2 pieces. A one-piece pouch has a skin barrier piece and the pouch itself in one unit. A two-piece pouch has a skin barrier with a separate pouch that snaps on and off of the skin barrier. Either way, you should empty the pouch when it is only  to  full. Do not let more stool or gas build up. This could cause the pouch to leak. Some ostomy bags have a built-in gas release valve. Ostomy deodorizer (5 drops) can be put into the pouch to prevent odor. Some people use ostomy lubricant drops inside the pouch to help the stool slide out of the bag more easily and completely.  EMPTYING YOUR OSTOMY POUCH  You may get lessons on how to empty your pouch from a wound-ostomy nurse before you leave the hospital. Here are the basic steps:  Wash your hands with soap and water.  Sit far back on the toilet.  Put several pieces of toilet paper into the toilet water. This will prevent splashing as you empty  the stool into the toilet bowl.  Unclip or unvelcro the tail end of the pouch.  Unroll the tail and empty stool into the toilet.  Clean the tail with toilet paper.  Reroll the tail, and clip or velcro it closed.  Wash your hands again. CHANGING YOUR OSTOMY POUCH  Change your ostomy pouch about every 3 to 4 days for the first 6 weeks, then every 5 to7 days. Always change the bag sooner if there is any leakage or you begin to notice any discomfort or irritation of the skin around the stoma. When possible, plan to change your ostomy pouch before eating or drinking as this will lessen the chance of stool coming out during the  pouch change. A wound-ostomy nurse may teach you how to change your pouch before you leave the hospital. Here are the basic steps:  Lay out your supplies.  Wash your hands with soap and water.  Carefully remove the old pouch.  Wash the stoma and allow it to dry. Men may be advised to shave any hair around the stoma very carefully. This will make the adhesive stick better.  Use the stoma measuring guide that comes with your pouch set to decide what size hole you will need to cut in the skin barrier piece. Choose the smallest possible size that will hold the stoma but will not touch it.  Use the guide to trace the circle on the back of the skin barrier piece. Cut out the hole.  Hold the skin barrier piece over the stoma to make sure the hole is the correct size.  Remove the adhesive paper backing from the skin barrier piece.  Squeeze stoma paste around the opening of the skin barrier piece.  Clean and dry the skin around the stoma again.  Carefully fit the skin barrier piece over your stoma.  If you are using a two-piece pouch, snap the pouch onto the skin barrier piece.  Close the tail of the pouch.  Put your hand over the top of the skin barrier piece to help warm it for about 5 minutes, so that it conforms to your body better.  Wash your hands again. DIET  TIPS   Continue to follow your usual diet.  Drink about eight 8 oz glasses of water each day.  You can prevent gas by eating slowly and chewing your food thoroughly.  If you feel concerned that you have too much gas, you can cut back on gas-producing foods, such as:  Spicy foods.  Onions and garlic.  Cruciferous vegetables (cabbage, broccoli, cauliflower, Brussels sprouts).  Beans and legumes.  Some cheeses.  Eggs.  Fish.  Bubbly (carbonated) drinks.  Chewing gum. GENERAL TIPS   You can shower with or without the bag in place.  Always keep the bag on if you are bathing or swimming.  If your bag gets wet, you can dry it with a blow-dryer set to cool.  Avoid wearing tight clothing directly over your stoma so that it does not become irritated or bleed. Tight clothing can also prevent stool from draining into the pouch.  It is helpful to always have an extra skin barrier and pouch with you when traveling. Do not leave them anywhere too warm, as parts of them can melt.  Do not let your seat belt rest on your stoma. Try to keep the seat belt either above or below your stoma, or use a tiny pillow to cushion it.  You can still participate in sports, but you should avoid activities in which there is a risk of getting hit in the abdomen.  You can still have sex. It is a good idea to empty your pouch prior to sex. Some people and their partners feel very comfortable seeing the pouch during sex. Others choose to wear lingerie or a T-shirt that covers the device. SEEK IMMEDIATE MEDICAL CARE IF:  You notice a change in the size or color of the stoma, especially if it becomes very red, purple, black, or pale white.  You have bloody stools or bleeding from the stoma.  You have abdominal pain, nausea, vomiting, or bloating.  There is anything unusual protruding from the stoma.  You have irritation or red  skin around the stoma.  No stool is passing from the stoma.  You have  diarrhea (requiring more frequent than normal pouch emptying). Document Released: 10/11/2003 Document Revised: 12/31/2011 Document Reviewed: 03/07/2011 [facility] Patient Information 2014 [place], [place].

## 2014-03-27 NOTE — ED Provider Notes (Signed)
Medical screening examination/treatment/procedure(s) were conducted as a shared visit with non-physician practitioner(s) and myself.  I personally evaluated the patient during the encounter.   Pt alert, content, ambulatory about ED.  No malleolar or focal bony tenderness. Ostomy patent/functioning. Pt requests referral to gen surgery for reversal of colostomy.    Mirna Mires, MD 03/27/14 [phone]
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
## 2014-04-15 ENCOUNTER — Telehealth (INDEPENDENT_AMBULATORY_CARE_PROVIDER_SITE_OTHER): Payer: Self-pay

## 2014-04-15 NOTE — Telephone Encounter (Signed)
Pt called back after getting a text from his family member calling on the neighbor's phone. The pt was wanting to know what was going on b/c he does want an appt to see Dr Donne Hazel to get the colostomy reversed. I tried to explain to the pt about the family member letting our front desk staff know that the pt doesn't need an appt b/c he never stopped drinking or smoking but he kept interrupting me to tell me that he couldn't hear to text him. I explained that I couldn't talk any louder than I was b/c I was in a phone triage area with other people on the phone. The pt then yelled at me that I should just make him an appt and get scheduled for surgery b/c he has had this bag for 3 years. I went to explain to the pt that Dr Donne Hazel is probably not ever going to reverse the colostomy and the pt went crazy yelling then handed the phone to his neighbor for me to talk to him. The pt said that I had his blood bowling now that I needed to speak to his neighbor b/c that is the phone that he was calling from since he only texts on his phone. The pt is yelling in the background that Dr Donne Hazel needs to be the one fixing this since he is the surgeon that gave him the bag. I explained to the neighbor that Dr Donne Hazel advises that he will probably not ever do the surgery b/c pt is too much of a high risk. I explained that if the pt wants to talk to someone about the surgery that we just need to refer pt to a bigger academic center for the pt. The pt starts yelling that no he is going to see Dr Donne Hazel since he is the surgeon that did this to him. I tried to explain once again to the neighbor so he could relay this to the pt standing there that the pt is an extremely high risk for surgery. I explained that just b/c the pt wants this reversed by Dr Donne Hazel it doesn't always work like that b/c of the doctor feels the pt is a high risk then he would be putting the pt's life at a higher risk when the pt is clearly able to  live with the ostomy. I advised that the risk outweigh the surgical options. I advised the neighbor that the pt was told all of this at his last appt with Dr Donne Hazel that the pt was an extremely high risk and that he probably would not have the reversal done. The neighbor stated that he totally agrees with what Dr Donne Hazel is stating that the pt is a high risk. The neighbor said he was going to try to calm the pt down and explain all of this to him. I advised that if there is any concerns the POA really needs to be the one calling me since the pt can not hear on the phone. The neighbor agrees but got forced to be the middleman on the phone for the pt since he was so irate. I will be glad to make a referral for the pt if the pt wants to talk to someone else about surgery. I will wait to hear back from POA. The neighbor understands.
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
## 2014-06-22 ENCOUNTER — Emergency Department (HOSPITAL_COMMUNITY): Payer: Medicaid Other

## 2014-06-22 ENCOUNTER — Emergency Department (HOSPITAL_COMMUNITY)
Admission: EM | Admit: 2014-06-22 | Discharge: 2014-06-23 | Disposition: A | Discharge status: Home / Self Care | Payer: Medicaid Other | Attending: Emergency Medicine | Admitting: Emergency Medicine

## 2014-06-22 ENCOUNTER — Encounter (HOSPITAL_COMMUNITY): Payer: Self-pay | Admitting: Emergency Medicine

## 2014-06-22 DIAGNOSIS — Z8701 Personal history of pneumonia (recurrent): Secondary | ICD-10-CM | POA: Diagnosis not present

## 2014-06-22 DIAGNOSIS — E46 Unspecified protein-calorie malnutrition: Secondary | ICD-10-CM | POA: Insufficient documentation

## 2014-06-22 DIAGNOSIS — F411 Generalized anxiety disorder: Secondary | ICD-10-CM | POA: Insufficient documentation

## 2014-06-22 DIAGNOSIS — J209 Acute bronchitis, unspecified: Secondary | ICD-10-CM | POA: Insufficient documentation

## 2014-06-22 DIAGNOSIS — H9190 Unspecified hearing loss, unspecified ear: Secondary | ICD-10-CM | POA: Diagnosis not present

## 2014-06-22 DIAGNOSIS — J440 Chronic obstructive pulmonary disease with acute lower respiratory infection: Secondary | ICD-10-CM | POA: Insufficient documentation

## 2014-06-22 DIAGNOSIS — Z9889 Other specified postprocedural states: Secondary | ICD-10-CM | POA: Insufficient documentation

## 2014-06-22 DIAGNOSIS — I739 Peripheral vascular disease, unspecified: Secondary | ICD-10-CM | POA: Insufficient documentation

## 2014-06-22 DIAGNOSIS — Z720 Tobacco use: Secondary | ICD-10-CM

## 2014-06-22 DIAGNOSIS — Z79899 Other long term (current) drug therapy: Secondary | ICD-10-CM | POA: Diagnosis not present

## 2014-06-22 DIAGNOSIS — J40 Bronchitis, not specified as acute or chronic: Secondary | ICD-10-CM

## 2014-06-22 DIAGNOSIS — R0602 Shortness of breath: Secondary | ICD-10-CM | POA: Diagnosis present

## 2014-06-22 DIAGNOSIS — F1010 Alcohol abuse, uncomplicated: Secondary | ICD-10-CM | POA: Insufficient documentation

## 2014-06-22 DIAGNOSIS — I10 Essential (primary) hypertension: Secondary | ICD-10-CM | POA: Diagnosis not present

## 2014-06-22 DIAGNOSIS — F17200 Nicotine dependence, unspecified, uncomplicated: Secondary | ICD-10-CM | POA: Insufficient documentation

## 2014-06-22 DIAGNOSIS — F10920 Alcohol use, unspecified with intoxication, uncomplicated: Secondary | ICD-10-CM

## 2014-06-22 LAB — CBC WITH DIFFERENTIAL/PLATELET
BASOS ABS: 0.2 10*3/uL — AB (ref 0.0–0.1)
Basophils Relative: 2 % — ABNORMAL HIGH (ref 0–1)
Eosinophils Absolute: 0.1 10*3/uL (ref 0.0–0.7)
Eosinophils Relative: 1 % (ref 0–5)
HCT: 46.2 % (ref 39.0–52.0)
Hemoglobin: 16.2 g/dL (ref 13.0–17.0)
LYMPHS ABS: 4.1 10*3/uL — AB (ref 0.7–4.0)
LYMPHS PCT: 39 % (ref 12–46)
MCH: 30.7 pg (ref 26.0–34.0)
MCHC: 35.1 g/dL (ref 30.0–36.0)
MCV: 87.7 fL (ref 78.0–100.0)
Monocytes Absolute: 0.7 10*3/uL (ref 0.1–1.0)
Monocytes Relative: 7 % (ref 3–12)
NEUTROS ABS: 5.4 10*3/uL (ref 1.7–7.7)
Neutrophils Relative %: 51 % (ref 43–77)
PLATELETS: 294 10*3/uL (ref 150–400)
RBC: 5.27 MIL/uL (ref 4.22–5.81)
RDW: 13.8 % (ref 11.5–15.5)
WBC: 10.4 10*3/uL (ref 4.0–10.5)

## 2014-06-22 LAB — COMPREHENSIVE METABOLIC PANEL
ALBUMIN: 4.4 g/dL (ref 3.5–5.2)
ALK PHOS: 86 U/L (ref 39–117)
ALT: 20 U/L (ref 0–53)
ANION GAP: 21 — AB (ref 5–15)
AST: 35 U/L (ref 0–37)
BUN: 12 mg/dL (ref 6–23)
CO2: 23 mEq/L (ref 19–32)
Calcium: 9.4 mg/dL (ref 8.4–10.5)
Chloride: 92 mEq/L — ABNORMAL LOW (ref 96–112)
Creatinine, Ser: 0.73 mg/dL (ref 0.50–1.35)
GFR calc Af Amer: >90 mL/min (ref >90)
GFR calc non Af Amer: >90 mL/min (ref >90)
Glucose, Bld: 74 mg/dL (ref 70–99)
POTASSIUM: 4.6 meq/L (ref 3.7–5.3)
Sodium: 136 mEq/L — ABNORMAL LOW (ref 137–147)
TOTAL PROTEIN: 8.4 g/dL — AB (ref 6.0–8.3)
Total Bilirubin: 0.5 mg/dL (ref 0.3–1.2)

## 2014-06-22 LAB — TROPONIN I: Troponin I: <0.3 ng/mL (ref <0.30)

## 2014-06-22 LAB — ETHANOL: Alcohol, Ethyl (B): 266 mg/dL — ABNORMAL HIGH (ref 0–11)

## 2014-06-22 MED ORDER — IPRATROPIUM BROMIDE 0.02 % IN SOLN
0.5000 mg | Freq: Once | RESPIRATORY_TRACT | Status: AC
  Administered 2014-06-22: 0.5 mg via RESPIRATORY_TRACT
  Filled 2014-06-22: qty 2.5

## 2014-06-22 MED ORDER — LORAZEPAM 2 MG/ML IJ SOLN
1.0000 mg | Freq: Once | INTRAMUSCULAR | Status: AC
  Administered 2014-06-22: 1 mg via INTRAVENOUS
  Filled 2014-06-22: qty 1

## 2014-06-22 MED ORDER — ALBUTEROL SULFATE (2.5 MG/3ML) 0.083% IN NEBU
5.0000 mg | INHALATION_SOLUTION | Freq: Once | RESPIRATORY_TRACT | Status: AC
  Administered 2014-06-22: 5 mg via RESPIRATORY_TRACT
  Filled 2014-06-22: qty 6

## 2014-06-22 NOTE — ED Notes (Signed)
EMS called to home.  Found patient sitting with complaints of shortness of breath. Patient sat ok ORA.  Patient began to cough and EMS placed patient on O2.  Sat WDL. Patient denies any pain .

## 2014-06-22 NOTE — ED Notes (Signed)
Pt voided in emesis bag mixed with salava.  Pt was given urinal earier and will try later.

## 2014-06-22 NOTE — ED Provider Notes (Signed)
CSN: [id number]     Arrival date & time 06/22/14  2041 History   First MD Initiated Contact with Patient 06/22/14 2128     Chief Complaint  Patient presents with  . Shortness of Breath     (Consider location/radiation/quality/duration/timing/severity/associated sxs/prior Treatment) HPI  Zachary Walls is a 58 y.o. male who presents for evaluation of a numb feeling in the right side of his head, and shortness of breath. He also has decreased hearing from both ears. He has previously been told he needs surgery on his ears. His shortness of breath is worse when he wakes up in the morning. He continues to smoke cigarettes. He has cough that is productive of white sputum. He denies alcohol abuse, he smokes cigarettes, one pack per week. He is taking his usual prescribed medications. He denies weakness, dizziness, nausea, vomiting, change in bowel or urinary habits. There are no other known modifying factors.     Past Medical History  Diagnosis Date  . COPD (chronic obstructive pulmonary disease)   . History of pneumonia   . Hypertension     a. Normotensive 08/2013 off meds.  [name] HOH (hard of hearing)   . Protein calorie malnutrition   . ETOH abuse   . Tobacco abuse   . Diverticulitis     a. s/p colon resection and colostomy  . Cardiac arrest     a. 01/2013, felt to be 2/2 severe COPD req trach;  b. 01/2013 Echo: EF 65-70% w/o rwma.  . Difficult intubation   . CAD (coronary artery disease)     a. Nonobstructive CAD by cath 08/2013 (mod RCA, mid LAD myocardial bridge) - ECG was concerning for inf STEMI but pt ruled out. CP possibly GERD.   [name] Hyponatremia     a. During 08/2013 felt due to polydipsia.  . Polysubstance abuse     a. Tobacco, marijuana, alcoholism.  [name] Anxiety   . Shortness of breath   . Peripheral vascular disease    Past Surgical History  Procedure Laterality Date  . Hernia repair      inguinal  . Colostomy  12/11/2011    Procedure: COLOSTOMY;  Surgeon: Rolm Bookbinder,  MD;  Location: [facility];  Service: General;  Laterality: N/A;  . Colostomy revision  12/11/2011    Procedure: COLON RESECTION SIGMOID;  Surgeon: Rolm Bookbinder, MD;  Location: [facility];  Service: General;;  . Bowel resection  12/11/2011    Procedure: SMALL BOWEL RESECTION;  Surgeon: Rolm Bookbinder, MD;  Location: [facility];  Service: General;;  times 2  . Cystoscopy w/ ureteral stent placement  12/11/2011    Procedure: CYSTOSCOPY WITH STENT REPLACEMENT;  Surgeon: Malka So, MD;  Location: [facility];  Service: Urology;  Laterality: Left;  ureteral catheter placement  . Cardiac catheterization  08/25/2013  . Tracheostomy  01/2013    emergent d/t difficult intubation  . Colon surgery    . Tracheostomy closure     Family History  Problem Relation Age of Onset  . Heart attack Father   . Cancer Father     unknown type  . HIV Brother    History  Substance Use Topics  . Smoking status: Current Every Day Smoker -- 0.50 packs/day for 44 years    Types: Cigarettes    Last Attempt to Quit: 11/03/2011  . Smokeless tobacco: Never Used     Comment: Has smoked as much as 2-3 packs/day.  Now can make a pack last a week.  [name]  Alcohol Use: Yes     Comment: Previously drank heavily.  Admits to drinking 1-3 40 oz beers most days of the week.    Review of Systems  All other systems reviewed and are negative.     Allergies  Bee venom; Shellfish allergy; and Codeine  Home Medications   Prior to Admission medications   Medication Sig Start Date End Date Taking? Authorizing Provider  tiotropium (SPIRIVA) 18 MCG inhalation capsule Place 18 mcg into inhaler and inhale daily.   Yes Historical Provider, MD  albuterol (PROVENTIL) (2.5 MG/3ML) 0.083% nebulizer solution Take 2.5 mg by nebulization 3 (three) times daily.    Historical Provider, MD  albuterol-ipratropium (COMBIVENT) 18-103 MCG/ACT inhaler Inhale 2 puffs into the lungs every 4 (four) hours as needed for wheezing or shortness of breath.     Historical Provider, MD  atorvastatin (LIPITOR) 20 MG tablet Take 1 tablet (20 mg total) by mouth every evening. 11/26/13   Annita Brod, MD  ciprofloxacin (CIPRO) 500 MG tablet Take 1 tablet (500 mg total) by mouth 2 (two) times daily. 02/06/14   Megan Dort, PA-C  clotrimazole (LOTRIMIN) 1 % cream Apply to affected area 2 times daily 08/10/13   Elmyra Ricks Pisciotta, PA-C  diazepam (VALIUM) 5 MG tablet Take 1 tablet (5 mg total) by mouth 3 (three) times daily. 02/08/14   Tiffany L Reed, DO  docusate sodium 100 MG CAPS Take 100 mg by mouth 2 (two) times daily. 02/06/14   Megan Dort, PA-C  escitalopram (LEXAPRO) 10 MG tablet Take 1 tablet (10 mg total) by mouth daily. 11/26/13   Annita Brod, MD  feeding supplement, ENSURE COMPLETE, (ENSURE COMPLETE) LIQD Take 237 mLs by mouth 2 times daily at 12 noon and 4 pm. 02/06/14   Megan Dort, PA-C  folic acid (FOLVITE) 1 MG tablet Take 1 tablet (1 mg total) by mouth daily. 08/27/13   Dayna N Dunn, PA-C  LORazepam (ATIVAN) 0.5 MG tablet Take one to two tablets by mouth every 8 hours as needed for anxiety 03/25/14   Nehemiah Settle A Upstill, PA-C  losartan (COZAAR) 50 MG tablet Take 1 tablet (50 mg total) by mouth daily. 01/20/14   Thurnell Lose, MD  metoprolol tartrate (LOPRESSOR) 12.5 mg TABS tablet Take 0.5 tablets (12.5 mg total) by mouth 2 (two) times daily. 01/20/14   Thurnell Lose, MD  nitroGLYCERIN (NITROSTAT) 0.4 MG SL tablet Place 1 tablet (0.4 mg total) under the tongue every 5 (five) minutes as needed for chest pain (up to 3 doses). 08/27/13   Dayna N Dunn, PA-C  omeprazole (PRILOSEC) 20 MG capsule Take 1 capsule (20 mg total) by mouth daily. 11/26/13   Annita Brod, MD  QUEtiapine (SEROQUEL) 50 MG tablet Take 1 tablet (50 mg total) by mouth every morning. 11/26/13   Annita Brod, MD  risperiDONE (RISPERDAL) 0.5 MG tablet Take 1 tablet (0.5 mg total) by mouth 2 (two) times daily. 11/26/13   Annita Brod, MD  thiamine 100 MG tablet Take 1 tablet (100 mg  total) by mouth daily. 01/20/14   Thurnell Lose, MD  traMADol (ULTRAM) 50 MG tablet Take one tablet by mouth every 6 hours as needed for pain; Take one and 1/2 tablet by mouth every 6 hours as needed for moderate pain; Take two tablets by mouth every 6 hours as needed for severe pain 03/25/14   Shari A Upstill, PA-C   BP 183/99  Pulse 100  Temp(Src) 98.3 F (36.8 C) (  Oral)  Resp 18  SpO2 93% Physical Exam  Nursing note and vitals reviewed. Constitutional: He is oriented to person, place, and time. He appears well-developed.  He appears older than stated age.  HENT:  Head: Normocephalic and atraumatic.  Right Ear: External ear normal.  Left Ear: External ear normal.  Both external auditory canals, are occluded with wax.  Eyes: Conjunctivae and EOM are normal. Pupils are equal, round, and reactive to light.  Neck: Normal range of motion and phonation normal. Neck supple.  Cardiovascular: Normal rate, regular rhythm, normal heart sounds and intact distal pulses.   Pulmonary/Chest: He is in respiratory distress (increase work of breathing with intercostal retractions.). He has wheezes (Generalized). He has no rales. He exhibits no tenderness and no bony tenderness.  Abdominal: Soft. There is no tenderness.  Musculoskeletal: Normal range of motion. He exhibits no edema and no tenderness.  Neurological: He is alert and oriented to person, place, and time. No cranial nerve deficit or sensory deficit. He exhibits normal muscle tone. Coordination normal.  Skin: Skin is warm, dry and intact.  Psychiatric: He has a normal mood and affect. His behavior is normal. Judgment and thought content normal.    ED Course  Procedures (including critical care time)  Medications  albuterol (PROVENTIL) (2.5 MG/3ML) 0.083% nebulizer solution 5 mg (5 mg Nebulization Given 06/22/14 2205)  ipratropium (ATROVENT) nebulizer solution 0.5 mg (0.5 mg Nebulization Given 06/22/14 2205)  LORazepam (ATIVAN) injection 1 mg  (1 mg Intravenous Given 06/22/14 2205)    Patient Vitals for the past 24 hrs:  BP Temp Temp src Pulse Resp SpO2  06/22/14 2043 183/99 mmHg 98.3 F (36.8 C) Oral 100 18 93 %    12:23 AM Reevaluation with update and discussion. After initial assessment and treatment, an updated evaluation reveals he is feeling better now. Lungs have improved air movement. Findings discussed with the patient. He is going to have his son come and pick him up.Daleen Bo L      Date: 06/22/14- 2051  Rate: 95  Rhythm: normal sinus rhythm  QRS Axis: normal  PR and QT Intervals: normal  ST/T Wave abnormalities: Nonspecific anterior T-wave abnormality  PR and QRS Conduction Disutrbances:none  Narrative Interpretation:   Old EKG Reviewed: unchanged   Labs Review Labs Reviewed  CBC WITH DIFFERENTIAL - Abnormal; Notable for the following:    Lymphs Abs 4.1 (*)    Basophils Relative 2 (*)    Basophils Absolute 0.2 (*)    All other components within normal limits  COMPREHENSIVE METABOLIC PANEL - Abnormal; Notable for the following:    Sodium 136 (*)    Chloride 92 (*)    Total Protein 8.4 (*)    Anion gap 21 (*)    All other components within normal limits  ETHANOL - Abnormal; Notable for the following:    Alcohol, Ethyl (B) 266 (*)    All other components within normal limits  TROPONIN I  URINALYSIS, ROUTINE W REFLEX MICROSCOPIC  URINE RAPID DRUG SCREEN (HOSP PERFORMED)    Imaging Review Dg Chest 2 View  06/22/2014   CLINICAL DATA:  SHORTNESS OF BREATH, COPD.  EXAM: CHEST  2 VIEW  COMPARISON:  02/03/2014  FINDINGS: Lungs are hyperinflated. Mild lung base opacities, similar to prior. No pleural effusion or pneumothorax. Aortic tortuosity. Normal heart size. Sequelae of right rib fractures and incomplete healing of the right clavicle fracture.  IMPRESSION: COPD and lung base atelectasis or scarring.   Electronically Signed  By: Carlos Levering M.D.   On: 06/22/2014 22:13     EKG  Interpretation None      MDM   Final diagnoses:  Alcohol intoxication, uncomplicated  Bronchitis  Tobacco abuse    Shortness of breath, with apparent bronchitis related to tobacco abuse, and alcohol intoxication. He's improved with treatment in stable for discharge.  Nursing Notes Reviewed/ Care Coordinated Applicable Imaging Reviewed Interpretation of Laboratory Data incorporated into ED treatment  The patient appears reasonably screened and/or stabilized for discharge and I doubt any other medical condition or other [facility] requiring further screening, evaluation, or treatment in the ED at this time prior to discharge.  Plan: Home Medications- Albuterol, Zithromax; Home Treatments- avoid alcohol abuse; return here if the recommended treatment, does not improve the symptoms; Recommended follow up- PCP 1 week for check up    Richarda Blade, MD 06/23/14 [phone]

## 2014-06-23 ENCOUNTER — Emergency Department: Payer: Self-pay | Admitting: Emergency Medicine

## 2014-06-23 LAB — URINALYSIS, ROUTINE W REFLEX MICROSCOPIC
Bilirubin Urine: NEGATIVE
Glucose, UA: NEGATIVE mg/dL
HGB URINE DIPSTICK: NEGATIVE
Ketones, ur: NEGATIVE mg/dL
Leukocytes, UA: NEGATIVE
Nitrite: NEGATIVE
PH: 5 (ref 5.0–8.0)
Protein, ur: NEGATIVE mg/dL
SPECIFIC GRAVITY, URINE: 1.008 (ref 1.005–1.030)
UROBILINOGEN UA: 0.2 mg/dL (ref 0.0–1.0)

## 2014-06-23 LAB — BASIC METABOLIC PANEL
Anion Gap: 11 (ref 7–16)
BUN: 7 mg/dL (ref 7–18)
CALCIUM: 8.2 mg/dL — AB (ref 8.5–10.1)
CHLORIDE: 94 mmol/L — AB (ref 98–107)
Co2: 27 mmol/L (ref 21–32)
Creatinine: 0.79 mg/dL (ref 0.60–1.30)
EGFR (African American): >60
EGFR (Non-African Amer.): >60
Glucose: 87 mg/dL (ref 65–99)
Osmolality: 262 (ref 275–301)
Potassium: 3.6 mmol/L (ref 3.5–5.1)
Sodium: 132 mmol/L — ABNORMAL LOW (ref 136–145)

## 2014-06-23 LAB — CBC
HCT: 40.2 % (ref 40.0–52.0)
HGB: 13.4 g/dL (ref 13.0–18.0)
MCH: 30.6 pg (ref 26.0–34.0)
MCHC: 33.4 g/dL (ref 32.0–36.0)
MCV: 92 fL (ref 80–100)
PLATELETS: 233 10*3/uL (ref 150–440)
RBC: 4.4 10*6/uL (ref 4.40–5.90)
RDW: 14.5 % (ref 11.5–14.5)
WBC: 12.1 10*3/uL — AB (ref 3.8–10.6)

## 2014-06-23 LAB — RAPID URINE DRUG SCREEN, HOSP PERFORMED
Amphetamines: NOT DETECTED
Barbiturates: NOT DETECTED
Benzodiazepines: NOT DETECTED
Cocaine: NOT DETECTED
Opiates: NOT DETECTED
Tetrahydrocannabinol: NOT DETECTED

## 2014-06-23 LAB — TROPONIN I

## 2014-06-23 LAB — ETHANOL: Ethanol: 263 mg/dL

## 2014-06-23 MED ORDER — AZITHROMYCIN 250 MG PO TABS
500.0000 mg | ORAL_TABLET | Freq: Once | ORAL | Status: AC
  Administered 2014-06-23: 500 mg via ORAL
  Filled 2014-06-23: qty 2

## 2014-06-23 MED ORDER — AZITHROMYCIN 250 MG PO TABS: ORAL_TABLET | ORAL | Status: DC

## 2014-06-23 MED ORDER — ALBUTEROL SULFATE HFA 108 (90 BASE) MCG/ACT IN AERS
2.0000 | INHALATION_SPRAY | RESPIRATORY_TRACT | Status: DC | PRN
  Administered 2014-06-23: 2 via RESPIRATORY_TRACT
  Filled 2014-06-23: qty 6.7

## 2014-06-23 MED ORDER — AEROCHAMBER Z-STAT PLUS/MEDIUM MISC
1.0000 | Freq: Once | Status: AC
  Administered 2014-06-23: 1

## 2014-06-23 NOTE — Discharge Instructions (Signed)
Avoid all forms of alcohol. Used inhaler 2 puffs every 3 or 4 hours as needed for cough or trouble breathing.   Alcohol Intoxication Alcohol intoxication occurs when you drink enough alcohol that it affects your ability to function. It can be mild or very severe. Drinking a lot of alcohol in a short time is called binge drinking. This can be very harmful. Drinking alcohol can also be more dangerous if you are taking medicines or other drugs. Some of the effects caused by alcohol may include:  Loss of coordination.  Changes in mood and behavior.  Unclear thinking.  Trouble talking (slurred speech).  Throwing up (vomiting).  Confusion.  Slowed breathing.  Twitching and shaking (seizures).  Loss of consciousness. HOME CARE  Do not drive after drinking alcohol.  Drink enough water and fluids to keep your pee (urine) clear or pale yellow. Avoid caffeine.  Only take medicine as told by your doctor. GET HELP IF:  You throw up (vomit) many times.  You do not feel better after a few days.  You frequently have alcohol intoxication. Your doctor can help decide if you should see a substance use treatment counselor. GET HELP RIGHT AWAY IF:  You become shaky when you stop drinking.  You have twitching and shaking.  You throw up blood. It may look bright red or like coffee grounds.  You notice blood in your poop (bowel movements).  You become lightheaded or pass out (faint). MAKE SURE YOU:   Understand these instructions.  Will watch your condition.  Will get help right away if you are not doing well or get worse. Document Released: 03/26/2008 Document Revised: 06/10/2013 Document Reviewed: 03/13/2013 [facility] Patient Information 2015 [place], [place]. This information is not intended to replace advice given to you by your health care provider. Make sure you discuss any questions you have with your health care provider.  How Much is Too Much Alcohol? Drinking too much  alcohol can cause injury, accidents, and health problems. These types of problems can include:   Car crashes.  Falls.  Family fighting (domestic violence).  Drowning.  Fights.  Injuries.  Burns.  Damage to certain organs.  Having a baby with birth defects. ONE DRINK CAN BE TOO MUCH WHEN YOU ARE:  Working.  Pregnant or breastfeeding.  Taking medicines. Ask your doctor.  Driving or planning to drive. WHAT IS A STANDARD DRINK?   1 regular beer (12 ounces or 360 milliliters).  1 glass of wine (5 ounces or 150 milliliters).  1 shot of liquor (1.5 ounces or 45 milliliters). BLOOD ALCOHOL LEVELS   .00 A person is sober.  [name]03 A person has no trouble keeping balance, talking, or seeing right, but a "buzz" may be felt.  [name]05 A person feels "buzzed" and relaxed.  [name]08 or .10  A person is drunk. He or she has trouble talking, seeing right, and keeping his or her balance.  .15 A person loses body control and may pass out (blackout).  .20 A person has trouble walking (staggering) and throws up (vomits).  .30 A person will pass out (unconscious).  .40+ A person will be in a coma. Death is possible. If you or someone you know has a drinking problem, get help from a doctor.  Document Released: 08/04/2009 Document Revised: 12/31/2011 Document Reviewed: 08/04/2009 [facility] Patient Information 2015 [place], [place]. This information is not intended to replace advice given to you by your health care provider. Make sure you discuss any questions you have with  your health care provider.  Smoking Cessation Quitting smoking is important to your health and has many advantages. However, it is not always easy to quit since nicotine is a very addictive drug. Oftentimes, people try 3 times or more before being able to quit. This document explains the best ways for you to prepare to quit smoking. Quitting takes hard work and a lot of effort, but you can do it. ADVANTAGES OF QUITTING  SMOKING  You will live longer, feel better, and live better.  Your body will feel the impact of quitting smoking almost immediately.  Within 20 minutes, blood pressure decreases. Your pulse returns to its normal level.  After 8 hours, carbon monoxide levels in the blood return to normal. Your oxygen level increases.  After 24 hours, the chance of having a heart attack starts to decrease. Your breath, hair, and body stop smelling like smoke.  After 48 hours, damaged nerve endings begin to recover. Your sense of taste and smell improve.  After 72 hours, the body is virtually free of nicotine. Your bronchial tubes relax and breathing becomes easier.  After 2 to 12 weeks, lungs can hold more air. Exercise becomes easier and circulation improves.  The risk of having a heart attack, stroke, cancer, or lung disease is greatly reduced.  After 1 year, the risk of coronary heart disease is cut in half.  After 5 years, the risk of stroke falls to the same as a nonsmoker.  After 10 years, the risk of lung cancer is cut in half and the risk of other cancers decreases significantly.  After 15 years, the risk of coronary heart disease drops, usually to the level of a nonsmoker.  If you are pregnant, quitting smoking will improve your chances of having a healthy baby.  The people you live with, especially any children, will be healthier.  You will have extra money to spend on things other than cigarettes. QUESTIONS TO THINK ABOUT BEFORE ATTEMPTING TO QUIT You may want to talk about your answers with your health care provider.  Why do you want to quit?  If you tried to quit in the past, what helped and what did not?  What will be the most difficult situations for you after you quit? How will you plan to handle them?  Who can help you through the tough times? Your family? Friends? A health care provider?  What pleasures do you get from smoking? What ways can you still get pleasure if you  quit? Here are some questions to ask your health care provider:  How can you help me to be successful at quitting?  What medicine do you think would be best for me and how should I take it?  What should I do if I need more help?  What is smoking withdrawal like? How can I get information on withdrawal? GET READY  Set a quit date.  Change your environment by getting rid of all cigarettes, ashtrays, matches, and lighters in your home, car, or work. Do not let people smoke in your home.  Review your past attempts to quit. Think about what worked and what did not. GET SUPPORT AND ENCOURAGEMENT You have a better chance of being successful if you have help. You can get support in many ways.  Tell your family, friends, and coworkers that you are going to quit and need their support. Ask them not to smoke around you.  Get individual, group, or telephone counseling and support. Programs are available at  local hospitals and health centers. Call your local health department for information about programs in your area.  Spiritual beliefs and practices may help some smokers quit.  Download a "quit meter" on your computer to keep track of quit statistics, such as how long you have gone without smoking, cigarettes not smoked, and money saved.  Get a self-help book about quitting smoking and staying off tobacco. Brewer yourself from urges to smoke. Talk to someone, go for a walk, or occupy your time with a task.  Change your normal routine. Take a different route to work. Drink tea instead of coffee. Eat breakfast in a different place.  Reduce your stress. Take a hot bath, exercise, or read a book.  Plan something enjoyable to do every day. Reward yourself for not smoking.  Explore interactive web-based programs that specialize in helping you quit. GET MEDICINE AND USE IT CORRECTLY Medicines can help you stop smoking and decrease the urge to smoke. Combining  medicine with the above behavioral methods and support can greatly increase your chances of successfully quitting smoking.  Nicotine replacement therapy helps deliver nicotine to your body without the negative effects and risks of smoking. Nicotine replacement therapy includes nicotine gum, lozenges, inhalers, nasal sprays, and skin patches. Some may be available over-the-counter and others require a prescription.  Antidepressant medicine helps people abstain from smoking, but how this works is unknown. This medicine is available by prescription.  Nicotinic receptor partial agonist medicine simulates the effect of nicotine in your brain. This medicine is available by prescription. Ask your health care provider for advice about which medicines to use and how to use them based on your health history. Your health care provider will tell you what side effects to look out for if you choose to be on a medicine or therapy. Carefully read the information on the package. Do not use any other product containing nicotine while using a nicotine replacement product.  RELAPSE OR DIFFICULT SITUATIONS Most relapses occur within the first 3 months after quitting. Do not be discouraged if you start smoking again. Remember, most people try several times before finally quitting. You may have symptoms of withdrawal because your body is used to nicotine. You may crave cigarettes, be irritable, feel very hungry, cough often, get headaches, or have difficulty concentrating. The withdrawal symptoms are only temporary. They are strongest when you first quit, but they will go away within 10-14 days. To reduce the chances of relapse, try to:  Avoid drinking alcohol. Drinking lowers your chances of successfully quitting.  Reduce the amount of caffeine you consume. Once you quit smoking, the amount of caffeine in your body increases and can give you symptoms, such as a rapid heartbeat, sweating, and anxiety.  Avoid smokers  because they can make you want to smoke.  Do not let weight gain distract you. Many smokers will gain weight when they quit, usually less than 10 pounds. Eat a healthy diet and stay active. You can always lose the weight gained after you quit.  Find ways to improve your mood other than smoking. FOR MORE INFORMATION  www.smokefree.gov  Document Released: 10/02/2001 Document Revised: 02/22/2014 Document Reviewed: 01/17/2012 [facility] Patient Information 2015 [place], [place]. This information is not intended to replace advice given to you by your health care provider. Make sure you discuss any questions you have with your health care provider.

## 2014-06-28 ENCOUNTER — Encounter (HOSPITAL_COMMUNITY): Payer: Self-pay | Admitting: Emergency Medicine

## 2014-06-28 ENCOUNTER — Emergency Department (HOSPITAL_COMMUNITY)
Admission: EM | Admit: 2014-06-28 | Discharge: 2014-06-29 | Disposition: A | Discharge status: Home / Self Care | Payer: Medicaid Other | Attending: Emergency Medicine | Admitting: Emergency Medicine

## 2014-06-28 ENCOUNTER — Emergency Department (HOSPITAL_COMMUNITY)
Admission: EM | Admit: 2014-06-28 | Discharge: 2014-06-28 | Disposition: A | Discharge status: Home / Self Care | Payer: Medicaid Other | Attending: Emergency Medicine | Admitting: Emergency Medicine

## 2014-06-28 ENCOUNTER — Emergency Department (HOSPITAL_COMMUNITY): Payer: Medicaid Other

## 2014-06-28 DIAGNOSIS — Z8701 Personal history of pneumonia (recurrent): Secondary | ICD-10-CM | POA: Insufficient documentation

## 2014-06-28 DIAGNOSIS — J441 Chronic obstructive pulmonary disease with (acute) exacerbation: Secondary | ICD-10-CM | POA: Insufficient documentation

## 2014-06-28 DIAGNOSIS — Z8719 Personal history of other diseases of the digestive system: Secondary | ICD-10-CM | POA: Insufficient documentation

## 2014-06-28 DIAGNOSIS — Z8672 Personal history of thrombophlebitis: Secondary | ICD-10-CM | POA: Diagnosis not present

## 2014-06-28 DIAGNOSIS — Z792 Long term (current) use of antibiotics: Secondary | ICD-10-CM | POA: Insufficient documentation

## 2014-06-28 DIAGNOSIS — F17200 Nicotine dependence, unspecified, uncomplicated: Secondary | ICD-10-CM | POA: Insufficient documentation

## 2014-06-28 DIAGNOSIS — F411 Generalized anxiety disorder: Secondary | ICD-10-CM | POA: Insufficient documentation

## 2014-06-28 DIAGNOSIS — Z79899 Other long term (current) drug therapy: Secondary | ICD-10-CM | POA: Diagnosis not present

## 2014-06-28 DIAGNOSIS — I10 Essential (primary) hypertension: Secondary | ICD-10-CM | POA: Diagnosis not present

## 2014-06-28 DIAGNOSIS — Z8669 Personal history of other diseases of the nervous system and sense organs: Secondary | ICD-10-CM | POA: Insufficient documentation

## 2014-06-28 DIAGNOSIS — Z862 Personal history of diseases of the blood and blood-forming organs and certain disorders involving the immune mechanism: Secondary | ICD-10-CM | POA: Diagnosis not present

## 2014-06-28 DIAGNOSIS — R404 Transient alteration of awareness: Secondary | ICD-10-CM | POA: Diagnosis not present

## 2014-06-28 DIAGNOSIS — Z933 Colostomy status: Secondary | ICD-10-CM | POA: Insufficient documentation

## 2014-06-28 DIAGNOSIS — Z8639 Personal history of other endocrine, nutritional and metabolic disease: Secondary | ICD-10-CM | POA: Diagnosis not present

## 2014-06-28 DIAGNOSIS — Z9861 Coronary angioplasty status: Secondary | ICD-10-CM | POA: Insufficient documentation

## 2014-06-28 DIAGNOSIS — Z9889 Other specified postprocedural states: Secondary | ICD-10-CM | POA: Insufficient documentation

## 2014-06-28 DIAGNOSIS — H9190 Unspecified hearing loss, unspecified ear: Secondary | ICD-10-CM | POA: Insufficient documentation

## 2014-06-28 DIAGNOSIS — I2510 Atherosclerotic heart disease of native coronary artery without angina pectoris: Secondary | ICD-10-CM | POA: Insufficient documentation

## 2014-06-28 DIAGNOSIS — R0602 Shortness of breath: Secondary | ICD-10-CM | POA: Diagnosis present

## 2014-06-28 DIAGNOSIS — R6889 Other general symptoms and signs: Secondary | ICD-10-CM

## 2014-06-28 DIAGNOSIS — I739 Peripheral vascular disease, unspecified: Secondary | ICD-10-CM | POA: Insufficient documentation

## 2014-06-28 LAB — BASIC METABOLIC PANEL
Anion gap: 15 (ref 5–15)
BUN: 9 mg/dL (ref 6–23)
CHLORIDE: 98 meq/L (ref 96–112)
CO2: 26 mEq/L (ref 19–32)
Calcium: 8.4 mg/dL (ref 8.4–10.5)
Creatinine, Ser: 0.72 mg/dL (ref 0.50–1.35)
GFR calc Af Amer: >90 mL/min (ref >90)
GFR calc non Af Amer: >90 mL/min (ref >90)
Glucose, Bld: 80 mg/dL (ref 70–99)
POTASSIUM: 4.2 meq/L (ref 3.7–5.3)
Sodium: 139 mEq/L (ref 137–147)

## 2014-06-28 LAB — CBC WITH DIFFERENTIAL/PLATELET
Basophils Absolute: 0.1 10*3/uL (ref 0.0–0.1)
Basophils Relative: 1 % (ref 0–1)
EOS ABS: 0.1 10*3/uL (ref 0.0–0.7)
EOS PCT: 2 % (ref 0–5)
HEMATOCRIT: 40.1 % (ref 39.0–52.0)
HEMOGLOBIN: 13.8 g/dL (ref 13.0–17.0)
Lymphocytes Relative: 37 % (ref 12–46)
Lymphs Abs: 2.6 10*3/uL (ref 0.7–4.0)
MCH: 31.2 pg (ref 26.0–34.0)
MCHC: 34.4 g/dL (ref 30.0–36.0)
MCV: 90.5 fL (ref 78.0–100.0)
MONOS PCT: 6 % (ref 3–12)
Monocytes Absolute: 0.4 10*3/uL (ref 0.1–1.0)
Neutro Abs: 3.8 10*3/uL (ref 1.7–7.7)
Neutrophils Relative %: 54 % (ref 43–77)
Platelets: 179 10*3/uL (ref 150–400)
RBC: 4.43 MIL/uL (ref 4.22–5.81)
RDW: 14.3 % (ref 11.5–15.5)
WBC: 7.1 10*3/uL (ref 4.0–10.5)

## 2014-06-28 LAB — TROPONIN I: Troponin I: <0.3 ng/mL (ref <0.30)

## 2014-06-28 MED ORDER — ALBUTEROL SULFATE HFA 108 (90 BASE) MCG/ACT IN AERS
2.0000 | INHALATION_SPRAY | Freq: Once | RESPIRATORY_TRACT | Status: AC
  Administered 2014-06-28: 2 via RESPIRATORY_TRACT
  Filled 2014-06-28: qty 6.7

## 2014-06-28 MED ORDER — IPRATROPIUM-ALBUTEROL 0.5-2.5 (3) MG/3ML IN SOLN
3.0000 mL | Freq: Once | RESPIRATORY_TRACT | Status: AC
  Administered 2014-06-28: 3 mL via RESPIRATORY_TRACT
  Filled 2014-06-28: qty 3

## 2014-06-28 MED ORDER — METOPROLOL TARTRATE 12.5 MG HALF TABLET: 12.5000 mg | ORAL_TABLET | Freq: Two times a day (BID) | ORAL | Status: DC

## 2014-06-28 MED ORDER — INSULIN ASPART 100 UNIT/ML ~~LOC~~ SOLN: 10.0000 [IU] | Freq: Once | SUBCUTANEOUS | Status: DC

## 2014-06-28 MED ORDER — IPRATROPIUM-ALBUTEROL 18-103 MCG/ACT IN AERO: 2.0000 | INHALATION_SPRAY | RESPIRATORY_TRACT | Status: DC | PRN

## 2014-06-28 MED ORDER — TIOTROPIUM BROMIDE MONOHYDRATE 18 MCG IN CAPS: 18.0000 ug | ORAL_CAPSULE | Freq: Every day | RESPIRATORY_TRACT | Status: DC

## 2014-06-28 MED ORDER — LOSARTAN POTASSIUM 50 MG PO TABS: 50.0000 mg | ORAL_TABLET | Freq: Every day | ORAL | Status: DC

## 2014-06-28 MED ORDER — PREDNISONE 50 MG PO TABS: ORAL_TABLET | ORAL | Status: DC

## 2014-06-28 MED ORDER — ATORVASTATIN CALCIUM 20 MG PO TABS: 20.0000 mg | ORAL_TABLET | Freq: Every evening | ORAL | Status: DC

## 2014-06-28 MED ORDER — ALBUTEROL SULFATE (2.5 MG/3ML) 0.083% IN NEBU
2.5000 mg | INHALATION_SOLUTION | Freq: Once | RESPIRATORY_TRACT | Status: AC
  Administered 2014-06-28: 2.5 mg via RESPIRATORY_TRACT
  Filled 2014-06-28: qty 3

## 2014-06-28 MED ORDER — PREDNISONE 20 MG PO TABS
60.0000 mg | ORAL_TABLET | Freq: Once | ORAL | Status: AC
  Administered 2014-06-28: 60 mg via ORAL
  Filled 2014-06-28: qty 3

## 2014-06-28 MED ORDER — QUETIAPINE FUMARATE 50 MG PO TABS: 50.0000 mg | ORAL_TABLET | Freq: Every morning | ORAL | Status: DC

## 2014-06-28 NOTE — ED Notes (Addendum)
Received pt from home with c/o shortness of breath with productive cough. Pt admitted to EMS to drinking 3 beers this morning and a lot of beer last nite. Pt seen at [facility] and diagnosed with bronchitis and ETOH use. Pt reports that he has been out of all his home medications for past few days. Pt given albuterol neb treatment by EMS

## 2014-06-28 NOTE — ED Notes (Signed)
Pt needed Max assistance to transfer from W/C to stretcher chair. Humes PA-C notified that Pt unable to ambulate for pulse Ox.

## 2014-06-28 NOTE — ED Provider Notes (Signed)
Date: 06/28/2014 1133am  Rate: 91  Rhythm: normal sinus rhythm  QRS Axis: right  Intervals: normal  ST/T Wave abnormalities: inverted t wave in aVL and V2  Conduction Disutrbances:none  Narrative Interpretation: PVCs noted  Old EKG Reviewed: unchanged from 06/22/14     Sharyon Cable, MD 06/28/14 1140

## 2014-06-28 NOTE — ED Notes (Signed)
Pt c/o pain. Pt will not verbalize where pain is located. Pt requesting pain medication. Pt coughing when taking deep breaths. EDP aware.

## 2014-06-28 NOTE — ED Provider Notes (Signed)
Patient seen/examined in the Emergency Department in conjunction with Resident Physician Provider  Patient reports cough/SOB Exam : awake/alert, no distress Plan: labs pending at this time    Sharyon Cable, MD 06/28/14 1324

## 2014-06-28 NOTE — ED Notes (Addendum)
Patient was in the waiting room being very loud and kept stating he needed to be seen now because he was just seen here in the ED and was discharged with an inhaler that he lost and he came right back in to receive another one. Patient is no respiratory distress at this time. Moved patient into triage area to monitor him closely. Oxygen saturation level is 97% on RA.

## 2014-06-28 NOTE — ED Notes (Signed)
Pt reports that he was here this morning and discharged. States that he had an inhaler that he was given at discharge but gave it away by accident. No distress noted.

## 2014-06-28 NOTE — ED Provider Notes (Signed)
CSN: [id number]     Arrival date & time 06/28/14  1029 History   First MD Initiated Contact with Patient 06/28/14 1037     Chief Complaint  Patient presents with  . Shortness of Breath     (Consider location/radiation/quality/duration/timing/severity/associated sxs/prior Treatment) Patient is a 58 y.o. male presenting with shortness of breath.  Shortness of Breath Associated symptoms: no fever    Pt is a 58 y/o male w/ PMHx of COPD, PVD, colostomy, tobacco abuse, and alcohol abuse who presents to the ED for SOB and delirium. Pt appears disheveled and has multiple complaints including lt sided swelling and being able to feel his ribs. On further questioning patient gets more confused to the point he does not know why he is in the ED. Pt has a hx of alcohol abuse and he drank 3 beers around 4-5:00 am this morning. He states that when he "yacks" that is when he loses his breath. He was last seen in the ED on 9/1 and given a prescription for zithromax which he states he finished his full course of.   Past Medical History  Diagnosis Date  . COPD (chronic obstructive pulmonary disease)   . History of pneumonia   . Hypertension     a. Normotensive 08/2013 off meds.  [name] HOH (hard of hearing)   . Protein calorie malnutrition   . ETOH abuse   . Tobacco abuse   . Diverticulitis     a. s/p colon resection and colostomy  . Cardiac arrest     a. 01/2013, felt to be 2/2 severe COPD req trach;  b. 01/2013 Echo: EF 65-70% w/o rwma.  . Difficult intubation   . CAD (coronary artery disease)     a. Nonobstructive CAD by cath 08/2013 (mod RCA, mid LAD myocardial bridge) - ECG was concerning for inf STEMI but pt ruled out. CP possibly GERD.   [name] Hyponatremia     a. During 08/2013 felt due to polydipsia.  . Polysubstance abuse     a. Tobacco, marijuana, alcoholism.  [name] Anxiety   . Shortness of breath   . Peripheral vascular disease    Past Surgical History  Procedure Laterality Date  . Hernia repair       inguinal  . Colostomy  12/11/2011    Procedure: COLOSTOMY;  Surgeon: Rolm Bookbinder, MD;  Location: [facility];  Service: General;  Laterality: N/A;  . Colostomy revision  12/11/2011    Procedure: COLON RESECTION SIGMOID;  Surgeon: Rolm Bookbinder, MD;  Location: [facility];  Service: General;;  . Bowel resection  12/11/2011    Procedure: SMALL BOWEL RESECTION;  Surgeon: Rolm Bookbinder, MD;  Location: [facility];  Service: General;;  times 2  . Cystoscopy w/ ureteral stent placement  12/11/2011    Procedure: CYSTOSCOPY WITH STENT REPLACEMENT;  Surgeon: Malka So, MD;  Location: [facility];  Service: Urology;  Laterality: Left;  ureteral catheter placement  . Cardiac catheterization  08/25/2013  . Tracheostomy  01/2013    emergent d/t difficult intubation  . Colon surgery    . Tracheostomy closure     Family History  Problem Relation Age of Onset  . Heart attack Father   . Cancer Father     unknown type  . HIV Brother    History  Substance Use Topics  . Smoking status: Current Every Day Smoker -- 0.50 packs/day for 44 years    Types: Cigarettes    Last Attempt to Quit: 11/03/2011  . Smokeless  tobacco: Never Used     Comment: Has smoked as much as 2-3 packs/day.  Now can make a pack last a week.  . Alcohol Use: Yes     Comment: Previously drank heavily.  Admits to drinking 1-3 40 oz beers most days of the week.    Review of Systems  Constitutional: Negative for fever and chills.  Respiratory: Positive for shortness of breath.   Musculoskeletal:       Lt sided pain   Neurological:       Delirium       Allergies  Bee venom; Shellfish allergy; and Codeine  Home Medications   Prior to Admission medications   Medication Sig Start Date End Date Taking? Authorizing Provider  albuterol (PROVENTIL) (2.5 MG/3ML) 0.083% nebulizer solution Take 2.5 mg by nebulization 3 (three) times daily.    Historical Provider, MD  albuterol-ipratropium (COMBIVENT) 18-103 MCG/ACT inhaler Inhale 2  puffs into the lungs every 4 (four) hours as needed for wheezing or shortness of breath.    Historical Provider, MD  atorvastatin (LIPITOR) 20 MG tablet Take 1 tablet (20 mg total) by mouth every evening. 11/26/13   Annita Brod, MD  azithromycin (ZITHROMAX Z-PAK) 250 MG tablet 2 po day one, then 1 daily x 4 days 06/23/14   Richarda Blade, MD  ciprofloxacin (CIPRO) 500 MG tablet Take 1 tablet (500 mg total) by mouth 2 (two) times daily. 02/06/14   Megan N Dort, PA-C  clotrimazole (LOTRIMIN) 1 % cream Apply to affected area 2 times daily 08/10/13   Elmyra Ricks Pisciotta, PA-C  diazepam (VALIUM) 5 MG tablet Take 1 tablet (5 mg total) by mouth 3 (three) times daily. 02/08/14   Tiffany L Reed, DO  docusate sodium 100 MG CAPS Take 100 mg by mouth 2 (two) times daily. 02/06/14   Megan N Dort, PA-C  escitalopram (LEXAPRO) 10 MG tablet Take 1 tablet (10 mg total) by mouth daily. 11/26/13   Annita Brod, MD  feeding supplement, ENSURE COMPLETE, (ENSURE COMPLETE) LIQD Take 237 mLs by mouth 2 times daily at 12 noon and 4 pm. 02/06/14   Megan N Dort, PA-C  folic acid (FOLVITE) 1 MG tablet Take 1 tablet (1 mg total) by mouth daily. 08/27/13   Dayna N Dunn, PA-C  LORazepam (ATIVAN) 0.5 MG tablet Take one to two tablets by mouth every 8 hours as needed for anxiety 03/25/14   Nehemiah Settle A Upstill, PA-C  losartan (COZAAR) 50 MG tablet Take 1 tablet (50 mg total) by mouth daily. 01/20/14   Thurnell Lose, MD  metoprolol tartrate (LOPRESSOR) 12.5 mg TABS tablet Take 0.5 tablets (12.5 mg total) by mouth 2 (two) times daily. 01/20/14   Thurnell Lose, MD  nitroGLYCERIN (NITROSTAT) 0.4 MG SL tablet Place 1 tablet (0.4 mg total) under the tongue every 5 (five) minutes as needed for chest pain (up to 3 doses). 08/27/13   Dayna N Dunn, PA-C  omeprazole (PRILOSEC) 20 MG capsule Take 1 capsule (20 mg total) by mouth daily. 11/26/13   Annita Brod, MD  predniSONE (DELTASONE) 50 MG tablet Take 50mg  once a day for 4 days. 06/28/14   Julious Oka, MD  QUEtiapine (SEROQUEL) 50 MG tablet Take 1 tablet (50 mg total) by mouth every morning. 11/26/13   Annita Brod, MD  risperiDONE (RISPERDAL) 0.5 MG tablet Take 1 tablet (0.5 mg total) by mouth 2 (two) times daily. 11/26/13   Annita Brod, MD  thiamine 100 MG tablet  Take 1 tablet (100 mg total) by mouth daily. 01/20/14   Thurnell Lose, MD  tiotropium (SPIRIVA) 18 MCG inhalation capsule Place 18 mcg into inhaler and inhale daily.    Historical Provider, MD  traMADol (ULTRAM) 50 MG tablet Take one tablet by mouth every 6 hours as needed for pain; Take one and 1/2 tablet by mouth every 6 hours as needed for moderate pain; Take two tablets by mouth every 6 hours as needed for severe pain 03/25/14   Nehemiah Settle A Upstill, PA-C   BP 125/65  Pulse 84  Temp(Src) 98.6 F (37 C) (Oral)  Resp 20  Wt 104 lb (47.174 kg)  SpO2 95% Physical Exam  Constitutional: No distress.  Appears older than stated age, thin   HENT:  Mouth/Throat: No oropharyngeal exudate.  Eyes: Pupils are equal, round, and reactive to light.  Cardiovascular: Normal rate and regular rhythm.   Pulmonary/Chest: Breath sounds normal. No respiratory distress. He has no wheezes.  Abdominal: Bowel sounds are normal. There is tenderness (generalized all over tenderness).  Ostomy bag intact on left side abdominal wall   Lymphadenopathy:    He has no cervical adenopathy.  Psychiatric:  Difficult to follow commands    ED Course  Procedures (including critical care time) Labs Review Labs Reviewed  BASIC METABOLIC PANEL  CBC WITH DIFFERENTIAL    Imaging Review Dg Chest Portable 1 View  06/28/2014   CLINICAL DATA:  Shortness of breath.  EXAM: PORTABLE CHEST - 1 VIEW  COMPARISON:  06/23/2014  FINDINGS: Stable COPD. There is no evidence of pulmonary edema, consolidation, pneumothorax, nodule or pleural fluid. The heart size and mediastinal contours are within normal limits. Visualized bony structures are unremarkable.   IMPRESSION: Stable COPD.  No acute findings.   Electronically Signed   By: Aletta Edouard M.D.   On: 06/28/2014 11:46      MDM   Final diagnoses:  SOB (shortness of breath)    Pt presents w/ SOB and unable to pinpoint his exact problem. CXR stable. Pt satting at 98-99% room air w/ pulse around 80's. Pt appears stable for d/c. Will get social work to speak w/ him regarding PCP and home health as he use to have a nurse come to his house weekly. Given rx for prednisone 50mg  once a day for 4 days.     Julious Oka, MD 06/28/14 1515

## 2014-06-28 NOTE — ED Provider Notes (Signed)
CSN: [id number]     Arrival date & time 06/28/14  1827 History   First MD Initiated Contact with Patient 06/28/14 2057     Chief Complaint  Patient presents with  . Shortness of Breath    (Consider location/radiation/quality/duration/timing/severity/associated sxs/prior Treatment) HPI Comments: Patient is a 58 year old male with a history of CAD, COPD, shortness of breath, hypertension, alcohol abuse, tobacco abuse, and polysubstance abuse. Patient presents to the emergency department for the second time in 24 hours for complaints of shortness of breath. Similar to his prior visit, patient does have multiple complaints. He states that he lost his albuterol inhaler which was given to him upon discharge. He also states that he, again, feels short of breath. Patient is requesting that he have all of his daily medications refilled. He is also requesting admission and that the surgeon reverse his colostomy tomorrow. During the encounter, it was very difficult to direct the patient to 1 complaint specifically. He does complain the most about his shortness of breath. He endorses smoking 1 pack cigarettes q 3 days. Was seen by care management earlier today to arrange outpatient PCP follow up.  Patient is a 58 y.o. male presenting with shortness of breath. The history is provided by the patient. No language interpreter was used.  Shortness of Breath Associated symptoms: cough   Associated symptoms: no fever and no vomiting     Past Medical History  Diagnosis Date  . COPD (chronic obstructive pulmonary disease)   . History of pneumonia   . Hypertension     a. Normotensive 08/2013 off meds.  [name] HOH (hard of hearing)   . Protein calorie malnutrition   . ETOH abuse   . Tobacco abuse   . Diverticulitis     a. s/p colon resection and colostomy  . Cardiac arrest     a. 01/2013, felt to be 2/2 severe COPD req trach;  b. 01/2013 Echo: EF 65-70% w/o rwma.  . Difficult intubation   . CAD (coronary artery  disease)     a. Nonobstructive CAD by cath 08/2013 (mod RCA, mid LAD myocardial bridge) - ECG was concerning for inf STEMI but pt ruled out. CP possibly GERD.   [name] Hyponatremia     a. During 08/2013 felt due to polydipsia.  . Polysubstance abuse     a. Tobacco, marijuana, alcoholism.  [name] Anxiety   . Shortness of breath   . Peripheral vascular disease    Past Surgical History  Procedure Laterality Date  . Hernia repair      inguinal  . Colostomy  12/11/2011    Procedure: COLOSTOMY;  Surgeon: Rolm Bookbinder, MD;  Location: [facility];  Service: General;  Laterality: N/A;  . Colostomy revision  12/11/2011    Procedure: COLON RESECTION SIGMOID;  Surgeon: Rolm Bookbinder, MD;  Location: [facility];  Service: General;;  . Bowel resection  12/11/2011    Procedure: SMALL BOWEL RESECTION;  Surgeon: Rolm Bookbinder, MD;  Location: [facility];  Service: General;;  times 2  . Cystoscopy w/ ureteral stent placement  12/11/2011    Procedure: CYSTOSCOPY WITH STENT REPLACEMENT;  Surgeon: Malka So, MD;  Location: [facility];  Service: Urology;  Laterality: Left;  ureteral catheter placement  . Cardiac catheterization  08/25/2013  . Tracheostomy  01/2013    emergent d/t difficult intubation  . Colon surgery    . Tracheostomy closure     Family History  Problem Relation Age of Onset  . Heart attack Father   .  Cancer Father     unknown type  . HIV Brother    History  Substance Use Topics  . Smoking status: Current Every Day Smoker -- 0.50 packs/day for 44 years    Types: Cigarettes    Last Attempt to Quit: 11/03/2011  . Smokeless tobacco: Never Used     Comment: Has smoked as much as 2-3 packs/day.  Now can make a pack last a week.  . Alcohol Use: Yes     Comment: Previously drank heavily.  Admits to drinking 1-3 40 oz beers most days of the week.    Review of Systems  Constitutional: Negative for fever.  Respiratory: Positive for cough and shortness of breath.   Gastrointestinal: Negative for  vomiting.  Neurological: Negative for syncope.  All other systems reviewed and are negative.   Allergies  Bee venom; Shellfish allergy; and Codeine  Home Medications   Prior to Admission medications   Medication Sig Start Date End Date Taking? Authorizing Provider  albuterol (PROVENTIL) (2.5 MG/3ML) 0.083% nebulizer solution Take 2.5 mg by nebulization 3 (three) times daily.    Historical Provider, MD  albuterol-ipratropium (COMBIVENT) 18-103 MCG/ACT inhaler Inhale 2 puffs into the lungs every 4 (four) hours as needed for wheezing or shortness of breath. 06/28/14   Antonietta Breach, PA-C  atorvastatin (LIPITOR) 20 MG tablet Take 1 tablet (20 mg total) by mouth every evening. 06/28/14   Antonietta Breach, PA-C  azithromycin (ZITHROMAX Z-PAK) 250 MG tablet 2 po day one, then 1 daily x 4 days 06/23/14   Richarda Blade, MD  ciprofloxacin (CIPRO) 500 MG tablet Take 1 tablet (500 mg total) by mouth 2 (two) times daily. 02/06/14   Megan N Dort, PA-C  clotrimazole (LOTRIMIN) 1 % cream Apply to affected area 2 times daily 08/10/13   Elmyra Ricks Pisciotta, PA-C  diazepam (VALIUM) 5 MG tablet Take 1 tablet (5 mg total) by mouth 3 (three) times daily. 02/08/14   Tiffany L Reed, DO  docusate sodium 100 MG CAPS Take 100 mg by mouth 2 (two) times daily. 02/06/14   Megan N Dort, PA-C  escitalopram (LEXAPRO) 10 MG tablet Take 1 tablet (10 mg total) by mouth daily. 11/26/13   Annita Brod, MD  feeding supplement, ENSURE COMPLETE, (ENSURE COMPLETE) LIQD Take 237 mLs by mouth 2 times daily at 12 noon and 4 pm. 02/06/14   Megan N Dort, PA-C  folic acid (FOLVITE) 1 MG tablet Take 1 tablet (1 mg total) by mouth daily. 08/27/13   Dayna N Dunn, PA-C  LORazepam (ATIVAN) 0.5 MG tablet Take one to two tablets by mouth every 8 hours as needed for anxiety 03/25/14   Nehemiah Settle A Upstill, PA-C  losartan (COZAAR) 50 MG tablet Take 1 tablet (50 mg total) by mouth daily. 06/28/14   Antonietta Breach, PA-C  metoprolol tartrate (LOPRESSOR) 12.5 mg TABS tablet Take  0.5 tablets (12.5 mg total) by mouth 2 (two) times daily. 06/28/14   Antonietta Breach, PA-C  nitroGLYCERIN (NITROSTAT) 0.4 MG SL tablet Place 1 tablet (0.4 mg total) under the tongue every 5 (five) minutes as needed for chest pain (up to 3 doses). 08/27/13   Dayna N Dunn, PA-C  omeprazole (PRILOSEC) 20 MG capsule Take 1 capsule (20 mg total) by mouth daily. 11/26/13   Annita Brod, MD  predniSONE (DELTASONE) 50 MG tablet Take 50mg  once a day for 4 days. 06/28/14   Julious Oka, MD  QUEtiapine (SEROQUEL) 50 MG tablet Take 1 tablet (50 mg total) by  mouth every morning. 06/28/14   Antonietta Breach, PA-C  risperiDONE (RISPERDAL) 0.5 MG tablet Take 1 tablet (0.5 mg total) by mouth 2 (two) times daily. 11/26/13   Annita Brod, MD  thiamine 100 MG tablet Take 1 tablet (100 mg total) by mouth daily. 01/20/14   Thurnell Lose, MD  tiotropium (SPIRIVA) 18 MCG inhalation capsule Place 1 capsule (18 mcg total) into inhaler and inhale daily. 06/28/14   Antonietta Breach, PA-C  traMADol (ULTRAM) 50 MG tablet Take one tablet by mouth every 6 hours as needed for pain; Take one and 1/2 tablet by mouth every 6 hours as needed for moderate pain; Take two tablets by mouth every 6 hours as needed for severe pain 03/25/14   Shari A Upstill, PA-C   BP 141/83  Pulse 89  Temp(Src) 98.4 F (36.9 C)  Resp 24  SpO2 96%  Physical Exam  Nursing note and vitals reviewed. Constitutional: He is oriented to person, place, and time. He appears well-developed and well-nourished. No distress.  Nontoxic/nonseptic appearing.  HENT:  Head: Normocephalic and atraumatic.  Eyes: Conjunctivae and EOM are normal. No scleral icterus.  Neck: Normal range of motion.  Cardiovascular: Normal rate, regular rhythm and intact distal pulses.   Pulmonary/Chest: Effort normal. No respiratory distress. He has no wheezes. He has no rales.  Decreased breath sounds diffusely with mild tachypnea. Chest expansion symmetric. Patient speaking in full sentences.   Abdominal: Soft. He exhibits no distension.  Colostomy bag in place in L mid abdomen with brown stool.  Musculoskeletal: Normal range of motion.  Neurological: He is alert and oriented to person, place, and time. He exhibits normal muscle tone. Coordination normal.  Patient moves extremities without ataxia.  Skin: Skin is warm and dry. No rash noted. He is not diaphoretic. No erythema. No pallor.  Psychiatric: He has a normal mood and affect. His behavior is normal.    ED Course  Procedures (including critical care time) Labs Review Labs Reviewed  TROPONIN I    Imaging Review Dg Chest Portable 1 View  06/28/2014   CLINICAL DATA:  Shortness of breath.  EXAM: PORTABLE CHEST - 1 VIEW  COMPARISON:  06/23/2014  FINDINGS: Stable COPD. There is no evidence of pulmonary edema, consolidation, pneumothorax, nodule or pleural fluid. The heart size and mediastinal contours are within normal limits. Visualized bony structures are unremarkable.  IMPRESSION: Stable COPD.  No acute findings.   Electronically Signed   By: Aletta Edouard M.D.   On: 06/28/2014 11:46     EKG Interpretation None      MDM   Final diagnoses:  Shortness of breath  Multiple complaints    58 year old male presents to the emergency department with multiple complaints. During the encounter, it was very difficult to direct the patient to one complaints specifically; however, he seemed to be most concerned of his shortness of breath. Patient was seen and evaluated for this complaint a few hours prior to checking into the emergency department again.   Labs reviewed from this encounter which appear to be at baseline. EKG unchanged from 06/22/2014. Troponin ordered for further evaluation of shortness of breath. Troponin was unremarkable today. Patient also treated in ED with DuoNeb and oral steroids. Patient continues to have reassuring vitals. No hypoxia during the encounter. No tachypnea on recheck and lungs without wheezing,  rhonchi, or rales. Patient speaking in full sentences. Will refill the patient's hypertension and COPD medications. Additional albuterol inhaler also provided. Have advised the patient to  follow up with his primary care provider for further evaluation of his chronic conditions. Have also recommended that he contact his surgeon to discuss timeline for colostomy reversal. Patient verbalizes understanding and agreement with plan. He is stable for discharge and was discharged from the ED in good condition.   Filed Vitals:   06/28/14 1834 06/28/14 2125 06/28/14 2235  BP: 158/90 155/104 141/83  Pulse: 88 102 89  Temp: 98.4 F (36.9 C)    Resp: 18 24 24   SpO2: 99% 96% 96%     Antonietta Breach, PA-C 06/29/14 [place], PA-C 06/29/14 2409

## 2014-06-28 NOTE — ED Provider Notes (Signed)
I have personally seen and examined the patient.  I have discussed the plan of care with the resident.  I have reviewed the documentation on PMH/FH/Soc. History.  I have reviewed the documentation of the resident and agree.  Pt stable, no distress.  His main complaint was his SOB He is in no distress, CXR clear He also asked me to manage/change his colostomy which I am unable to do but will refer to surgery He is in no distress, answers questions appropriately but will have some scattered responses at times but otherwise is likely at baseline, and is not currently in delirium Will ask care management to see patient prior to d/c   Sharyon Cable, MD 06/28/14 1534

## 2014-06-28 NOTE — Discharge Instructions (Signed)
Emergency department is not the appropriate venue for multiple complaints. Recommend you followup with your primary care provider to discuss these complaints more in detail. Take your daily prescribed medications as they have been prescribed to you. Recommend prednisone as prescribed which was recently prescribed at your last visit. Discussed plans for colostomy reversal with your general surgeon.  Chronic Obstructive Pulmonary Disease Chronic obstructive pulmonary disease (COPD) is a common lung condition in which airflow from the lungs is limited. COPD is a general term that can be used to describe many different lung problems that limit airflow, including both chronic bronchitis and emphysema. If you have COPD, your lung function will probably never return to normal, but there are measures you can take to improve lung function and make yourself feel better.  CAUSES   Smoking (common).   Exposure to secondhand smoke.   Genetic problems.  Chronic inflammatory lung diseases or recurrent infections. SYMPTOMS   Shortness of breath, especially with physical activity.   Deep, persistent (chronic) cough with a large amount of thick mucus.   Wheezing.   Rapid breaths (tachypnea).   Gray or bluish discoloration (cyanosis) of the skin, especially in fingers, toes, or lips.   Fatigue.   Weight loss.   Frequent infections or episodes when breathing symptoms become much worse (exacerbations).   Chest tightness. DIAGNOSIS  Your health care provider will take a medical history and perform a physical examination to make the initial diagnosis. Additional tests for COPD may include:   Lung (pulmonary) function tests.  Chest X-ray.  CT scan.  Blood tests. TREATMENT  Treatment available to help you feel better when you have COPD includes:   Inhaler and nebulizer medicines. These help manage the symptoms of COPD and make your breathing more comfortable.  Supplemental oxygen.  Supplemental oxygen is only helpful if you have a low oxygen level in your blood.   Exercise and physical activity. These are beneficial for nearly all people with COPD. Some people may also benefit from a pulmonary rehabilitation program. HOME CARE INSTRUCTIONS   Take all medicines (inhaled or pills) as directed by your health care provider.  Avoid over-the-counter medicines or cough syrups that dry up your airway (such as antihistamines) and slow down the elimination of secretions unless instructed otherwise by your health care provider.   If you are a smoker, the most important thing that you can do is stop smoking. Continuing to smoke will cause further lung damage and breathing trouble. Ask your health care provider for help with quitting smoking. He or she can direct you to community resources or hospitals that provide support.  Avoid exposure to irritants such as smoke, chemicals, and fumes that aggravate your breathing.  Use oxygen therapy and pulmonary rehabilitation if directed by your health care provider. If you require home oxygen therapy, ask your health care provider whether you should purchase a pulse oximeter to measure your oxygen level at home.   Avoid contact with individuals who have a contagious illness.  Avoid extreme temperature and humidity changes.  Eat healthy foods. Eating smaller, more frequent meals and resting before meals may help you maintain your strength.  Stay active, but balance activity with periods of rest. Exercise and physical activity will help you maintain your ability to do things you want to do.  Preventing infection and hospitalization is very important when you have COPD. Make sure to receive all the vaccines your health care provider recommends, especially the pneumococcal and influenza vaccines. Ask your health  care provider whether you need a pneumonia vaccine.  Learn and use relaxation techniques to manage stress.  Learn and use  controlled breathing techniques as directed by your health care provider. Controlled breathing techniques include:   Pursed lip breathing. Start by breathing in (inhaling) through your nose for 1 second. Then, purse your lips as if you were going to whistle and breathe out (exhale) through the pursed lips for 2 seconds.   Diaphragmatic breathing. Start by putting one hand on your abdomen just above your waist. Inhale slowly through your nose. The hand on your abdomen should move out. Then purse your lips and exhale slowly. You should be able to feel the hand on your abdomen moving in as you exhale.   Learn and use controlled coughing to clear mucus from your lungs. Controlled coughing is a series of short, progressive coughs. The steps of controlled coughing are:  1. Lean your head slightly forward.  2. Breathe in deeply using diaphragmatic breathing.  3. Try to hold your breath for 3 seconds.  4. Keep your mouth slightly open while coughing twice.  5. Spit any mucus out into a tissue.  6. Rest and repeat the steps once or twice as needed. SEEK MEDICAL CARE IF:   You are coughing up more mucus than usual.   There is a change in the color or thickness of your mucus.   Your breathing is more labored than usual.   Your breathing is faster than usual.  SEEK IMMEDIATE MEDICAL CARE IF:   You have shortness of breath while you are resting.   You have shortness of breath that prevents you from:  Being able to talk.   Performing your usual physical activities.   You have chest pain lasting longer than 5 minutes.   Your skin color is more cyanotic than usual.  You measure low oxygen saturations for longer than 5 minutes with a pulse oximeter. MAKE SURE YOU:   Understand these instructions.  Will watch your condition.  Will get help right away if you are not doing well or get worse. Document Released: 07/18/2005 Document Revised: 02/22/2014 Document Reviewed:  06/04/2013 [facility] Patient Information 2015 [place], [place]. This information is not intended to replace advice given to you by your health care provider. Make sure you discuss any questions you have with your health care provider.

## 2014-06-28 NOTE — Progress Notes (Signed)
ED CM consulted by Dr. Christy Gentles concerning patient needing assistance with finding a PCP near home. ED CM spoke with patient regarding assistance with finding a PCP, patient was agreeable, he reports being assigned a PCP over 30 miles from home. Explained I could assist with the names and numbers of some PCP in his area. He is agreeable to [facility] in [place]. CM will call to schedule appointment and have office contact patient 9/8. Dr. Christy Gentles and Lenna Sciara RN on [address] made aware of disposition plan.

## 2014-06-28 NOTE — Discharge Instructions (Signed)
Start taking prednisone 50mg  once a day for 4 days.

## 2014-06-28 NOTE — ED Notes (Signed)
Pt requesting medication for pain. Also states he is hungry,

## 2014-06-28 NOTE — ED Notes (Signed)
Pt verbally upset and wondering about why he is "being denied medication" EDP aware.

## 2014-06-28 NOTE — ED Notes (Signed)
MR Bauch<s Family called for D/C home.

## 2014-06-29 NOTE — ED Provider Notes (Signed)
Medical screening examination/treatment/procedure(s) were conducted as a shared visit with non-physician practitioner(s) and myself.  I personally evaluated the patient during the encounter.   EKG Interpretation None      Patient presents for evaluation of shortness of breath. Patient had mild bronchospasm and increased respiratory rate upon arrival. Patient does have a history of COPD. He had a full workup earlier today without any abnormalities found. Patient to be administered increased bronchodilator therapy, discharged with albuterol inhaler and prednisone.  Orpah Greek, MD 06/29/14 [phone]

## 2014-06-29 NOTE — Care Management (Signed)
ED CM attempted to contact patient at numbers provided by patient and verified in record. No answer at any of the 2 numbers provided. ED CM will make a 2nd attempt tomorrow 9/9.

## 2014-07-05 ENCOUNTER — Encounter (HOSPITAL_COMMUNITY): Payer: Self-pay | Admitting: Emergency Medicine

## 2014-07-05 ENCOUNTER — Emergency Department (HOSPITAL_COMMUNITY)
Admission: EM | Admit: 2014-07-05 | Discharge: 2014-07-06 | Disposition: A | Discharge status: Home / Self Care | Payer: Medicaid Other | Attending: Emergency Medicine | Admitting: Emergency Medicine

## 2014-07-05 ENCOUNTER — Emergency Department (HOSPITAL_COMMUNITY): Payer: Medicaid Other

## 2014-07-05 DIAGNOSIS — Z8701 Personal history of pneumonia (recurrent): Secondary | ICD-10-CM | POA: Diagnosis not present

## 2014-07-05 DIAGNOSIS — Z9889 Other specified postprocedural states: Secondary | ICD-10-CM | POA: Diagnosis not present

## 2014-07-05 DIAGNOSIS — IMO0002 Reserved for concepts with insufficient information to code with codable children: Secondary | ICD-10-CM | POA: Diagnosis not present

## 2014-07-05 DIAGNOSIS — F17200 Nicotine dependence, unspecified, uncomplicated: Secondary | ICD-10-CM | POA: Diagnosis not present

## 2014-07-05 DIAGNOSIS — J441 Chronic obstructive pulmonary disease with (acute) exacerbation: Secondary | ICD-10-CM | POA: Diagnosis not present

## 2014-07-05 DIAGNOSIS — I739 Peripheral vascular disease, unspecified: Secondary | ICD-10-CM | POA: Insufficient documentation

## 2014-07-05 DIAGNOSIS — I10 Essential (primary) hypertension: Secondary | ICD-10-CM | POA: Diagnosis not present

## 2014-07-05 DIAGNOSIS — R0602 Shortness of breath: Secondary | ICD-10-CM | POA: Insufficient documentation

## 2014-07-05 DIAGNOSIS — H9190 Unspecified hearing loss, unspecified ear: Secondary | ICD-10-CM | POA: Insufficient documentation

## 2014-07-05 DIAGNOSIS — Z79899 Other long term (current) drug therapy: Secondary | ICD-10-CM | POA: Diagnosis not present

## 2014-07-05 DIAGNOSIS — F411 Generalized anxiety disorder: Secondary | ICD-10-CM | POA: Insufficient documentation

## 2014-07-05 DIAGNOSIS — I2510 Atherosclerotic heart disease of native coronary artery without angina pectoris: Secondary | ICD-10-CM | POA: Insufficient documentation

## 2014-07-05 MED ORDER — MAGNESIUM SULFATE 40 MG/ML IJ SOLN
2.0000 g | Freq: Once | INTRAMUSCULAR | Status: AC
  Administered 2014-07-06: 2 g via INTRAVENOUS
  Filled 2014-07-05: qty 50

## 2014-07-05 MED ORDER — IPRATROPIUM-ALBUTEROL 0.5-2.5 (3) MG/3ML IN SOLN
3.0000 mL | RESPIRATORY_TRACT | Status: AC
  Administered 2014-07-05 – 2014-07-06 (×2): 3 mL via RESPIRATORY_TRACT
  Filled 2014-07-05: qty 6

## 2014-07-05 MED ORDER — IPRATROPIUM-ALBUTEROL 0.5-2.5 (3) MG/3ML IN SOLN: 3.0000 mL | RESPIRATORY_TRACT | Status: DC

## 2014-07-05 MED ORDER — PREDNISONE 20 MG PO TABS: 60.0000 mg | ORAL_TABLET | Freq: Once | ORAL | Status: DC

## 2014-07-05 NOTE — ED Notes (Addendum)
Patient states ongoing sob, patient states he is out of albuterol neb and albuterol inhaler, patient states also gets anxiety attack which brings on sob , patient received albuterol neb, duoneb and Solumedrol 125 mg per EMS

## 2014-07-05 NOTE — ED Provider Notes (Signed)
CSN: [id number]     Arrival date & time 07/05/14  2219 History   First MD Initiated Contact with Patient 07/05/14 2254     Chief Complaint  Patient presents with  . Shortness of Breath     (Consider location/radiation/quality/duration/timing/severity/associated sxs/prior Treatment) HPI  Zachary Walls is a 58 y.o. male with a past medical history of alcohol abuse COPD and coronary artery disease coming in with shortness of breath. Patient states this has worsened throughout the day. He denies any chest pain. He denies any fevers or change in his cough. Patient was transferred by EMS, received breathing treatments and steroids. He states his symptoms have significantly improved since then.  10 Systems reviewed and are negative for acute change except as noted in the HPI.    Past Medical History  Diagnosis Date  . COPD (chronic obstructive pulmonary disease)   . History of pneumonia   . Hypertension     a. Normotensive 08/2013 off meds.  [name] HOH (hard of hearing)   . Protein calorie malnutrition   . ETOH abuse   . Tobacco abuse   . Diverticulitis     a. s/p colon resection and colostomy  . Cardiac arrest     a. 01/2013, felt to be 2/2 severe COPD req trach;  b. 01/2013 Echo: EF 65-70% w/o rwma.  . Difficult intubation   . CAD (coronary artery disease)     a. Nonobstructive CAD by cath 08/2013 (mod RCA, mid LAD myocardial bridge) - ECG was concerning for inf STEMI but pt ruled out. CP possibly GERD.   [name] Hyponatremia     a. During 08/2013 felt due to polydipsia.  . Polysubstance abuse     a. Tobacco, marijuana, alcoholism.  [name] Anxiety   . Shortness of breath   . Peripheral vascular disease    Past Surgical History  Procedure Laterality Date  . Hernia repair      inguinal  . Colostomy  12/11/2011    Procedure: COLOSTOMY;  Surgeon: Rolm Bookbinder, MD;  Location: [facility];  Service: General;  Laterality: N/A;  . Colostomy revision  12/11/2011    Procedure: COLON RESECTION SIGMOID;   Surgeon: Rolm Bookbinder, MD;  Location: [facility];  Service: General;;  . Bowel resection  12/11/2011    Procedure: SMALL BOWEL RESECTION;  Surgeon: Rolm Bookbinder, MD;  Location: [facility];  Service: General;;  times 2  . Cystoscopy w/ ureteral stent placement  12/11/2011    Procedure: CYSTOSCOPY WITH STENT REPLACEMENT;  Surgeon: Malka So, MD;  Location: [facility];  Service: Urology;  Laterality: Left;  ureteral catheter placement  . Cardiac catheterization  08/25/2013  . Tracheostomy  01/2013    emergent d/t difficult intubation  . Colon surgery    . Tracheostomy closure     Family History  Problem Relation Age of Onset  . Heart attack Father   . Cancer Father     unknown type  . HIV Brother    History  Substance Use Topics  . Smoking status: Current Every Day Smoker -- 0.50 packs/day for 44 years    Types: Cigarettes    Last Attempt to Quit: 11/03/2011  . Smokeless tobacco: Never Used     Comment: Has smoked as much as 2-3 packs/day.  Now can make a pack last a week.  . Alcohol Use: Yes     Comment: Previously drank heavily.  Admits to drinking 1-3 40 oz beers most days of the week.  Review of Systems    Allergies  Bee venom; Shellfish allergy; and Codeine  Home Medications   Prior to Admission medications   Medication Sig Start Date End Date Taking? Authorizing Provider  albuterol (PROVENTIL) (2.5 MG/3ML) 0.083% nebulizer solution Take 2.5 mg by nebulization 3 (three) times daily.    Historical Provider, MD  albuterol-ipratropium (COMBIVENT) 18-103 MCG/ACT inhaler Inhale 2 puffs into the lungs every 4 (four) hours as needed for wheezing or shortness of breath. 06/28/14   Antonietta Breach, PA-C  atorvastatin (LIPITOR) 20 MG tablet Take 1 tablet (20 mg total) by mouth every evening. 06/28/14   Antonietta Breach, PA-C  azithromycin (ZITHROMAX Z-PAK) 250 MG tablet 2 po day one, then 1 daily x 4 days 06/23/14   Richarda Blade, MD  ciprofloxacin (CIPRO) 500 MG tablet Take 1 tablet (500 mg  total) by mouth 2 (two) times daily. 02/06/14   Megan N Dort, PA-C  clotrimazole (LOTRIMIN) 1 % cream Apply to affected area 2 times daily 08/10/13   Elmyra Ricks Pisciotta, PA-C  diazepam (VALIUM) 5 MG tablet Take 1 tablet (5 mg total) by mouth 3 (three) times daily. 02/08/14   Tiffany L Reed, DO  docusate sodium 100 MG CAPS Take 100 mg by mouth 2 (two) times daily. 02/06/14   Megan N Dort, PA-C  escitalopram (LEXAPRO) 10 MG tablet Take 1 tablet (10 mg total) by mouth daily. 11/26/13   Annita Brod, MD  feeding supplement, ENSURE COMPLETE, (ENSURE COMPLETE) LIQD Take 237 mLs by mouth 2 times daily at 12 noon and 4 pm. 02/06/14   Megan N Dort, PA-C  folic acid (FOLVITE) 1 MG tablet Take 1 tablet (1 mg total) by mouth daily. 08/27/13   Dayna N Dunn, PA-C  LORazepam (ATIVAN) 0.5 MG tablet Take one to two tablets by mouth every 8 hours as needed for anxiety 03/25/14   Nehemiah Settle A Upstill, PA-C  losartan (COZAAR) 50 MG tablet Take 1 tablet (50 mg total) by mouth daily. 06/28/14   Antonietta Breach, PA-C  metoprolol tartrate (LOPRESSOR) 12.5 mg TABS tablet Take 0.5 tablets (12.5 mg total) by mouth 2 (two) times daily. 06/28/14   Antonietta Breach, PA-C  nitroGLYCERIN (NITROSTAT) 0.4 MG SL tablet Place 1 tablet (0.4 mg total) under the tongue every 5 (five) minutes as needed for chest pain (up to 3 doses). 08/27/13   Dayna N Dunn, PA-C  omeprazole (PRILOSEC) 20 MG capsule Take 1 capsule (20 mg total) by mouth daily. 11/26/13   Annita Brod, MD  predniSONE (DELTASONE) 50 MG tablet Take 50mg  once a day for 4 days. 06/28/14   Julious Oka, MD  QUEtiapine (SEROQUEL) 50 MG tablet Take 1 tablet (50 mg total) by mouth every morning. 06/28/14   Antonietta Breach, PA-C  risperiDONE (RISPERDAL) 0.5 MG tablet Take 1 tablet (0.5 mg total) by mouth 2 (two) times daily. 11/26/13   Annita Brod, MD  thiamine 100 MG tablet Take 1 tablet (100 mg total) by mouth daily. 01/20/14   Thurnell Lose, MD  tiotropium (SPIRIVA) 18 MCG inhalation capsule Place 1  capsule (18 mcg total) into inhaler and inhale daily. 06/28/14   Antonietta Breach, PA-C  traMADol (ULTRAM) 50 MG tablet Take one tablet by mouth every 6 hours as needed for pain; Take one and 1/2 tablet by mouth every 6 hours as needed for moderate pain; Take two tablets by mouth every 6 hours as needed for severe pain 03/25/14   Shari A Upstill, PA-C   BP  126/64  Pulse 81  Temp(Src) 98 F (36.7 C) (Oral)  Resp 16  Ht 5\' 7"  (1.702 m)  Wt 104 lb (47.174 kg)  BMI 16.28 kg/m2  SpO2 95% Physical Exam  Nursing note and vitals reviewed. Constitutional: He is oriented to person, place, and time. Vital signs are normal. He appears well-developed and well-nourished.  Non-toxic appearance. He does not appear ill. No distress.  HENT:  Head: Normocephalic and atraumatic.  Nose: Nose normal.  Mouth/Throat: Oropharynx is clear and moist. No oropharyngeal exudate.  Eyes: Conjunctivae and EOM are normal. Pupils are equal, round, and reactive to light. No scleral icterus.  Neck: Normal range of motion. Neck supple. No tracheal deviation, no edema, no erythema and normal range of motion present. No mass and no thyromegaly present.  Cardiovascular: Normal rate, regular rhythm, S1 normal, S2 normal, normal heart sounds, intact distal pulses and normal pulses.  Exam reveals no gallop and no friction rub.   No murmur heard. Pulses:      Radial pulses are 2+ on the right side, and 2+ on the left side.       Dorsalis pedis pulses are 2+ on the right side, and 2+ on the left side.  Pulmonary/Chest: Effort normal. No respiratory distress. He has wheezes. He has no rhonchi. He has no rales.  Prolonged expiratory phase  Abdominal: Soft. Normal appearance and bowel sounds are normal. He exhibits no distension, no ascites and no mass. There is no hepatosplenomegaly. There is no tenderness. There is no rebound, no guarding and no CVA tenderness.  Colostomy reversal  Musculoskeletal: Normal range of motion. He exhibits no  edema and no tenderness.  Lymphadenopathy:    He has no cervical adenopathy.  Neurological: He is alert and oriented to person, place, and time. He has normal strength. No cranial nerve deficit or sensory deficit. GCS eye subscore is 4. GCS verbal subscore is 5. GCS motor subscore is 6.  Skin: Skin is warm, dry and intact. No petechiae and no rash noted. He is not diaphoretic. No erythema. No pallor.  Psychiatric: He has a normal mood and affect. His behavior is normal. Judgment normal.    ED Course  Procedures (including critical care time) Labs Review Labs Reviewed  CBC WITH DIFFERENTIAL - Abnormal; Notable for the following:    RBC 4.19 (*)    HCT 37.1 (*)    Monocytes Relative 2 (*)    Basophils Relative 2 (*)    All other components within normal limits  BASIC METABOLIC PANEL - Abnormal; Notable for the following:    Anion gap 18 (*)    All other components within normal limits  MAGNESIUM  INFLUENZA PANEL BY PCR (TYPE A & B, H1N1)    Imaging Review Dg Chest 2 View  07/05/2014   CLINICAL DATA:  Shortness of breath.  History of smoking.  EXAM: CHEST  2 VIEW  COMPARISON:  06/28/2014  FINDINGS: Lungs are hyperinflated. There is perihilar peribronchial thickening. No focal consolidations or pleural effusions are identified. The heart is normal in size. No edema.  IMPRESSION: 1. Hyperinflation. 2. Bronchitic changes. 3.  No focal acute pulmonary abnormality.   Electronically Signed   By: Shon Hale M.D.   On: 07/05/2014 23:52     EKG Interpretation   Date/Time:  Monday July 05 2014 22:28:21 EDT Ventricular Rate:  75 PR Interval:  142 QRS Duration: 88 QT Interval:  442 QTC Calculation: 494 R Axis:   93 Text Interpretation:  Sinus  rhythm Ventricular premature complex  Borderline right axis deviation No significant change since last tracing  Confirmed by Glynn Octave [id number]) on 07/05/2014 11:01:08 PM      MDM   Final diagnoses:  None    Patient presents  emergency department out of concern for shortness of breath. Physical exam reveals diffuse expiratory wheezing and prolonged expiratory phase. He was given breathing treatments and steroids by EMS. We'll continue treatment of COPD exacerbation emergency department. Will reevaluate anticipate discharge.  Patient received 3 total DuoNeb nebs in the emergency department as well as  IV magnesium.  Solu-Medrol was given by EMS. Upon my repeat assessment of the patient, his wheezing had resolved. There is no respiratory distress or tachypnea. Patient is in no acute distress resting comfortably in the bed. She was also given azithromycin for COPD exacerbation. He was given a description for 4 more days. He was advised to followup closely within 3 days with his primary care physician for continued management.  Patient had episodes of hypoxia to 80% in emergency department while sleeping and laying flat. When awoken and sat up his saturations immediately rises to 95%. Remainder of his vitals remain within his normal limits and he is safe for discharge.   Everlene Balls, MD 07/06/14 [phone]

## 2014-07-05 NOTE — ED Notes (Signed)
Patient transported to X-ray 

## 2014-07-06 LAB — INFLUENZA PANEL BY PCR (TYPE A & B)
H1N1FLUPCR: NOT DETECTED
Influenza A By PCR: NEGATIVE
Influenza B By PCR: NEGATIVE

## 2014-07-06 LAB — CBC WITH DIFFERENTIAL/PLATELET
BASOS ABS: 0.1 10*3/uL (ref 0.0–0.1)
BASOS PCT: 2 % — AB (ref 0–1)
EOS ABS: 0.1 10*3/uL (ref 0.0–0.7)
EOS PCT: 1 % (ref 0–5)
HEMATOCRIT: 37.1 % — AB (ref 39.0–52.0)
Hemoglobin: 13.1 g/dL (ref 13.0–17.0)
LYMPHS PCT: 19 % (ref 12–46)
Lymphs Abs: 1.1 10*3/uL (ref 0.7–4.0)
MCH: 31.3 pg (ref 26.0–34.0)
MCHC: 35.3 g/dL (ref 30.0–36.0)
MCV: 88.5 fL (ref 78.0–100.0)
MONO ABS: 0.1 10*3/uL (ref 0.1–1.0)
Monocytes Relative: 2 % — ABNORMAL LOW (ref 3–12)
Neutro Abs: 4.7 10*3/uL (ref 1.7–7.7)
Neutrophils Relative %: 76 % (ref 43–77)
Platelets: 215 10*3/uL (ref 150–400)
RBC: 4.19 MIL/uL — ABNORMAL LOW (ref 4.22–5.81)
RDW: 14.4 % (ref 11.5–15.5)
WBC: 6.1 10*3/uL (ref 4.0–10.5)

## 2014-07-06 LAB — BASIC METABOLIC PANEL
ANION GAP: 18 — AB (ref 5–15)
BUN: 10 mg/dL (ref 6–23)
CALCIUM: 8.6 mg/dL (ref 8.4–10.5)
CO2: 24 mEq/L (ref 19–32)
CREATININE: 0.9 mg/dL (ref 0.50–1.35)
Chloride: 98 mEq/L (ref 96–112)
GFR calc Af Amer: >90 mL/min (ref >90)
Glucose, Bld: 92 mg/dL (ref 70–99)
Potassium: 4.1 mEq/L (ref 3.7–5.3)
Sodium: 140 mEq/L (ref 137–147)

## 2014-07-06 LAB — MAGNESIUM: Magnesium: 1.8 mg/dL (ref 1.5–2.5)

## 2014-07-06 MED ORDER — AZITHROMYCIN 250 MG PO TABS: 250.0000 mg | ORAL_TABLET | Freq: Every day | ORAL | Status: DC

## 2014-07-06 MED ORDER — AZITHROMYCIN 250 MG PO TABS
500.0000 mg | ORAL_TABLET | Freq: Once | ORAL | Status: AC
  Administered 2014-07-06: 500 mg via ORAL
  Filled 2014-07-06: qty 2

## 2014-07-06 NOTE — Discharge Instructions (Signed)
Chronic Obstructive Pulmonary Disease Zachary Walls, you were seen today for shortness of breath.  Continue to take azithromycin for the next 4 days and follow up with your regular doctor within 3 days.  Use your albuterol inhaler every 4 hours for the next 2 days.  If your symptoms return or worsen, come back to the ED for repeat evaluation.  Thank you. Chronic obstructive pulmonary disease (COPD) is a common lung problem. In COPD, the flow of air from the lungs is limited. The way your lungs work will probably never return to normal, but there are things you can do to improve your lungs and make yourself feel better. HOME CARE  Take all medicines as told by your doctor.  Avoid medicines or cough syrups that dry up your airway (such as antihistamines) and do not allow you to get rid of thick spit. You do not need to avoid them if told differently by your doctor.  If you smoke, stop. Smoking makes the problem worse.  Avoid being around things that make your breathing worse (like smoke, chemicals, and fumes).  Use oxygen therapy and therapy to help improve your lungs (pulmonary rehabilitation) if told by your doctor. If you need home oxygen therapy, ask your doctor if you should buy a tool to measure your oxygen level (oximeter).  Avoid people who have a sickness you can catch (contagious).  Avoid going outside when it is very hot, cold, or humid.  Eat healthy foods. Eat smaller meals more often. Rest before meals.  Stay active, but remember to also rest.  Make sure to get all the shots (vaccines) your doctor recommends. Ask your doctor if you need a pneumonia shot.  Learn and use tips on how to relax.  Learn and use tips on how to control your breathing as told by your doctor. Try:  Breathing in (inhaling) through your nose for 1 second. Then, pucker your lips and breath out (exhale) through your lips for 2 seconds.  Putting one hand on your belly (abdomen). Breathe in slowly through your  nose for 1 second. Your hand on your belly should move out. Pucker your lips and breathe out slowly through your lips. Your hand on your belly should move in as you breathe out.  Learn and use controlled coughing to clear thick spit from your lungs. The steps are: 1. Lean your head a little forward. 2. Breathe in deeply. 3. Try to hold your breath for 3 seconds. 4. Keep your mouth slightly open while coughing 2 times. 5. Spit any thick spit out into a tissue. 6. Rest and do the steps again 1 or 2 times as needed. GET HELP IF:  You cough up more thick spit than usual.  There is a change in the color or thickness of the spit.  It is harder to breathe than usual.  Your breathing is faster than usual. GET HELP RIGHT AWAY IF:   You have shortness of breath while resting.  You have shortness of breath that stops you from:  Being able to talk.  Doing normal activities.  You chest hurts for longer than 5 minutes.  Your skin color is more blue than usual.  Your pulse oximeter shows that you have low oxygen for longer than 5 minutes. MAKE SURE YOU:   Understand these instructions.  Will watch your condition.  Will get help right away if you are not doing well or get worse. Document Released: 03/26/2008 Document Revised: 02/22/2014 Document Reviewed: 06/04/2013 [employer]  Patient Information 2015 [place]. This information is not intended to replace advice given to you by your health care provider. Make sure you discuss any questions you have with your health care provider.

## 2014-08-09 ENCOUNTER — Emergency Department (HOSPITAL_COMMUNITY): Payer: Medicaid Other

## 2014-08-09 ENCOUNTER — Emergency Department (HOSPITAL_COMMUNITY)
Admission: EM | Admit: 2014-08-09 | Discharge: 2014-08-09 | Disposition: A | Discharge status: Home / Self Care | Payer: Medicaid Other | Attending: Emergency Medicine | Admitting: Emergency Medicine

## 2014-08-09 ENCOUNTER — Encounter (HOSPITAL_COMMUNITY): Payer: Self-pay | Admitting: Emergency Medicine

## 2014-08-09 DIAGNOSIS — I10 Essential (primary) hypertension: Secondary | ICD-10-CM | POA: Insufficient documentation

## 2014-08-09 DIAGNOSIS — Z8701 Personal history of pneumonia (recurrent): Secondary | ICD-10-CM | POA: Insufficient documentation

## 2014-08-09 DIAGNOSIS — Z8719 Personal history of other diseases of the digestive system: Secondary | ICD-10-CM | POA: Diagnosis not present

## 2014-08-09 DIAGNOSIS — Z9889 Other specified postprocedural states: Secondary | ICD-10-CM | POA: Diagnosis not present

## 2014-08-09 DIAGNOSIS — F419 Anxiety disorder, unspecified: Secondary | ICD-10-CM | POA: Insufficient documentation

## 2014-08-09 DIAGNOSIS — J441 Chronic obstructive pulmonary disease with (acute) exacerbation: Secondary | ICD-10-CM | POA: Insufficient documentation

## 2014-08-09 DIAGNOSIS — I739 Peripheral vascular disease, unspecified: Secondary | ICD-10-CM | POA: Diagnosis not present

## 2014-08-09 DIAGNOSIS — Z7952 Long term (current) use of systemic steroids: Secondary | ICD-10-CM | POA: Insufficient documentation

## 2014-08-09 DIAGNOSIS — Z79899 Other long term (current) drug therapy: Secondary | ICD-10-CM | POA: Diagnosis not present

## 2014-08-09 DIAGNOSIS — I252 Old myocardial infarction: Secondary | ICD-10-CM | POA: Insufficient documentation

## 2014-08-09 DIAGNOSIS — I2510 Atherosclerotic heart disease of native coronary artery without angina pectoris: Secondary | ICD-10-CM | POA: Diagnosis not present

## 2014-08-09 DIAGNOSIS — H9190 Unspecified hearing loss, unspecified ear: Secondary | ICD-10-CM | POA: Diagnosis not present

## 2014-08-09 DIAGNOSIS — R079 Chest pain, unspecified: Secondary | ICD-10-CM | POA: Diagnosis not present

## 2014-08-09 DIAGNOSIS — R000 Tachycardia, unspecified: Secondary | ICD-10-CM | POA: Diagnosis not present

## 2014-08-09 DIAGNOSIS — Z720 Tobacco use: Secondary | ICD-10-CM | POA: Insufficient documentation

## 2014-08-09 DIAGNOSIS — R05 Cough: Secondary | ICD-10-CM | POA: Diagnosis present

## 2014-08-09 LAB — BASIC METABOLIC PANEL
ANION GAP: 15 (ref 5–15)
BUN: 6 mg/dL (ref 6–23)
CHLORIDE: 90 meq/L — AB (ref 96–112)
CO2: 30 mEq/L (ref 19–32)
Calcium: 9.5 mg/dL (ref 8.4–10.5)
Creatinine, Ser: 0.62 mg/dL (ref 0.50–1.35)
GFR calc Af Amer: >90 mL/min (ref >90)
GFR calc non Af Amer: >90 mL/min (ref >90)
Glucose, Bld: 101 mg/dL — ABNORMAL HIGH (ref 70–99)
Potassium: 4.4 mEq/L (ref 3.7–5.3)
Sodium: 135 mEq/L — ABNORMAL LOW (ref 137–147)

## 2014-08-09 LAB — CBC
HCT: 43.5 % (ref 39.0–52.0)
Hemoglobin: 14.9 g/dL (ref 13.0–17.0)
MCH: 31.3 pg (ref 26.0–34.0)
MCHC: 34.3 g/dL (ref 30.0–36.0)
MCV: 91.4 fL (ref 78.0–100.0)
PLATELETS: 262 10*3/uL (ref 150–400)
RBC: 4.76 MIL/uL (ref 4.22–5.81)
RDW: 14.5 % (ref 11.5–15.5)
WBC: 9.5 10*3/uL (ref 4.0–10.5)

## 2014-08-09 LAB — I-STAT TROPONIN, ED: Troponin i, poc: 0 ng/mL (ref 0.00–0.08)

## 2014-08-09 MED ORDER — OMEPRAZOLE 20 MG PO CPDR: 20.0000 mg | DELAYED_RELEASE_CAPSULE | Freq: Every day | ORAL | Status: AC

## 2014-08-09 MED ORDER — METOPROLOL TARTRATE 12.5 MG HALF TABLET: 12.5000 mg | ORAL_TABLET | Freq: Two times a day (BID) | ORAL | Status: DC

## 2014-08-09 MED ORDER — PREDNISONE 50 MG PO TABS: ORAL_TABLET | ORAL | Status: DC

## 2014-08-09 MED ORDER — IPRATROPIUM-ALBUTEROL 18-103 MCG/ACT IN AERO: 2.0000 | INHALATION_SPRAY | RESPIRATORY_TRACT | Status: DC | PRN

## 2014-08-09 MED ORDER — ATORVASTATIN CALCIUM 20 MG PO TABS: 20.0000 mg | ORAL_TABLET | Freq: Every evening | ORAL | Status: AC

## 2014-08-09 MED ORDER — METHYLPREDNISOLONE SODIUM SUCC 125 MG IJ SOLR
125.0000 mg | Freq: Once | INTRAMUSCULAR | Status: AC
  Administered 2014-08-09: 125 mg via INTRAVENOUS
  Filled 2014-08-09: qty 2

## 2014-08-09 MED ORDER — DOXYCYCLINE HYCLATE 100 MG PO CAPS: 100.0000 mg | ORAL_CAPSULE | Freq: Two times a day (BID) | ORAL | Status: DC

## 2014-08-09 MED ORDER — ALBUTEROL SULFATE HFA 108 (90 BASE) MCG/ACT IN AERS
2.0000 | INHALATION_SPRAY | RESPIRATORY_TRACT | Status: DC | PRN
  Filled 2014-08-09: qty 6.7

## 2014-08-09 MED ORDER — ALBUTEROL (5 MG/ML) CONTINUOUS INHALATION SOLN
10.0000 mg/h | INHALATION_SOLUTION | Freq: Once | RESPIRATORY_TRACT | Status: AC
  Administered 2014-08-09: 10 mg/h via RESPIRATORY_TRACT
  Filled 2014-08-09: qty 20

## 2014-08-09 MED ORDER — ALBUTEROL SULFATE (2.5 MG/3ML) 0.083% IN NEBU: 2.5000 mg | INHALATION_SOLUTION | Freq: Three times a day (TID) | RESPIRATORY_TRACT | Status: DC

## 2014-08-09 MED ORDER — IPRATROPIUM-ALBUTEROL 0.5-2.5 (3) MG/3ML IN SOLN
3.0000 mL | RESPIRATORY_TRACT | Status: DC
  Administered 2014-08-09: 3 mL via RESPIRATORY_TRACT
  Filled 2014-08-09: qty 3

## 2014-08-09 MED ORDER — LOSARTAN POTASSIUM 50 MG PO TABS: 50.0000 mg | ORAL_TABLET | Freq: Every day | ORAL | Status: DC

## 2014-08-09 MED ORDER — NITROGLYCERIN 0.4 MG SL SUBL: 0.4000 mg | SUBLINGUAL_TABLET | SUBLINGUAL | Status: DC | PRN

## 2014-08-09 MED ORDER — LEVOFLOXACIN IN D5W 750 MG/150ML IV SOLN
750.0000 mg | Freq: Once | INTRAVENOUS | Status: AC
  Administered 2014-08-09: 750 mg via INTRAVENOUS
  Filled 2014-08-09: qty 150

## 2014-08-09 NOTE — ED Notes (Signed)
Pt adds that he has ear drainage. Also coughing up phlegm. Chest soreness from breathing heavily at night.

## 2014-08-09 NOTE — ED Provider Notes (Signed)
CSN: [id number]     Arrival date & time 08/09/14  1555 History   First MD Initiated Contact with Patient 08/09/14 1916     Chief Complaint  Patient presents with  . COPD    out of medication for past 2 weeks  . Cough     (Consider location/radiation/quality/duration/timing/severity/associated sxs/prior Treatment) HPI Pt is a 58yo male with hx of COPD, HTN, hard of hearing, etoh abuse, tobacco abuse, CAD, and anxiety presenting to ED via EMS with c/o productive cough and SOB gradually worsening over last 1 week. Reports being out of his home COPD medications for about 2 weeks.  Pt is unsure exactly what medications he is suppose to have, states a family member is suppose to pick up his medications for him but states due to a mix-up on his cell phone, he has not been able to get into contact with this specific family member about his medications. Pt states he does live alone. No recent sick contacts or recent travel.   Past Medical History  Diagnosis Date  . COPD (chronic obstructive pulmonary disease)   . History of pneumonia   . Hypertension     a. Normotensive 08/2013 off meds.  [name] HOH (hard of hearing)   . Protein calorie malnutrition   . ETOH abuse   . Tobacco abuse   . Diverticulitis     a. s/p colon resection and colostomy  . Cardiac arrest     a. 01/2013, felt to be 2/2 severe COPD req trach;  b. 01/2013 Echo: EF 65-70% w/o rwma.  . Difficult intubation   . CAD (coronary artery disease)     a. Nonobstructive CAD by cath 08/2013 (mod RCA, mid LAD myocardial bridge) - ECG was concerning for inf STEMI but pt ruled out. CP possibly GERD.   [name] Hyponatremia     a. During 08/2013 felt due to polydipsia.  . Polysubstance abuse     a. Tobacco, marijuana, alcoholism.  [name] Anxiety   . Shortness of breath   . Peripheral vascular disease    Past Surgical History  Procedure Laterality Date  . Hernia repair      inguinal  . Colostomy  12/11/2011    Procedure: COLOSTOMY;  Surgeon:  Rolm Bookbinder, MD;  Location: [facility];  Service: General;  Laterality: N/A;  . Colostomy revision  12/11/2011    Procedure: COLON RESECTION SIGMOID;  Surgeon: Rolm Bookbinder, MD;  Location: [facility];  Service: General;;  . Bowel resection  12/11/2011    Procedure: SMALL BOWEL RESECTION;  Surgeon: Rolm Bookbinder, MD;  Location: [facility];  Service: General;;  times 2  . Cystoscopy w/ ureteral stent placement  12/11/2011    Procedure: CYSTOSCOPY WITH STENT REPLACEMENT;  Surgeon: Malka So, MD;  Location: [facility];  Service: Urology;  Laterality: Left;  ureteral catheter placement  . Cardiac catheterization  08/25/2013  . Tracheostomy  01/2013    emergent d/t difficult intubation  . Colon surgery    . Tracheostomy closure     Family History  Problem Relation Age of Onset  . Heart attack Father   . Cancer Father     unknown type  . HIV Brother    History  Substance Use Topics  . Smoking status: Current Every Day Smoker -- 0.50 packs/day for 44 years    Types: Cigarettes    Last Attempt to Quit: 11/03/2011  . Smokeless tobacco: Never Used     Comment: Has smoked as much as 2-3  packs/day.  Now can make a pack last a week.  . Alcohol Use: Yes     Comment: Previously drank heavily.  Admits to drinking 1-3 40 oz beers most days of the week.    Review of Systems  Constitutional: Negative for fever and chills.  Respiratory: Positive for cough and shortness of breath.   Cardiovascular: Positive for chest pain. Negative for palpitations.  Gastrointestinal: Positive for nausea. Negative for vomiting, abdominal pain, diarrhea and constipation.  All other systems reviewed and are negative.     Allergies  Bee venom; Shellfish allergy; and Codeine  Home Medications   Prior to Admission medications   Medication Sig Start Date End Date Taking? Authorizing Provider  clotrimazole (LOTRIMIN) 1 % cream Apply to affected area 2 times daily 08/10/13  Yes Nicole Pisciotta, PA-C  diazepam  (VALIUM) 5 MG tablet Take 1 tablet (5 mg total) by mouth 3 (three) times daily. 02/08/14  Yes Tiffany L Reed, DO  docusate sodium 100 MG CAPS Take 100 mg by mouth 2 (two) times daily. 02/06/14  Yes Megan N Dort, PA-C  escitalopram (LEXAPRO) 10 MG tablet Take 1 tablet (10 mg total) by mouth daily. 11/26/13  Yes Annita Brod, MD  folic acid (FOLVITE) 1 MG tablet Take 1 tablet (1 mg total) by mouth daily. 08/27/13  Yes Dayna N Dunn, PA-C  LORazepam (ATIVAN) 0.5 MG tablet Take one to two tablets by mouth every 8 hours as needed for anxiety 03/25/14  Yes Shari A Upstill, PA-C  QUEtiapine (SEROQUEL) 50 MG tablet Take 1 tablet (50 mg total) by mouth every morning. 06/28/14  Yes Antonietta Breach, PA-C  risperiDONE (RISPERDAL) 0.5 MG tablet Take 1 tablet (0.5 mg total) by mouth 2 (two) times daily. 11/26/13  Yes Annita Brod, MD  thiamine 100 MG tablet Take 1 tablet (100 mg total) by mouth daily. 01/20/14  Yes Thurnell Lose, MD  tiotropium (SPIRIVA) 18 MCG inhalation capsule Place 1 capsule (18 mcg total) into inhaler and inhale daily. 06/28/14  Yes Antonietta Breach, PA-C  traMADol (ULTRAM) 50 MG tablet Take one tablet by mouth every 6 hours as needed for pain; Take one and 1/2 tablet by mouth every 6 hours as needed for moderate pain; Take two tablets by mouth every 6 hours as needed for severe pain 03/25/14  Yes Shari A Upstill, PA-C  albuterol (PROVENTIL) (2.5 MG/3ML) 0.083% nebulizer solution Take 3 mLs (2.5 mg total) by nebulization 3 (three) times daily. 08/09/14   Noland Fordyce, PA-C  albuterol-ipratropium (COMBIVENT) 18-103 MCG/ACT inhaler Inhale 2 puffs into the lungs every 4 (four) hours as needed for wheezing or shortness of breath. 08/09/14   Noland Fordyce, PA-C  atorvastatin (LIPITOR) 20 MG tablet Take 1 tablet (20 mg total) by mouth every evening. 08/09/14   Noland Fordyce, PA-C  doxycycline (VIBRAMYCIN) 100 MG capsule Take 1 capsule (100 mg total) by mouth 2 (two) times daily. One po bid x 7 days 08/09/14   Noland Fordyce, PA-C  losartan (COZAAR) 50 MG tablet Take 1 tablet (50 mg total) by mouth daily. 08/09/14   Noland Fordyce, PA-C  metoprolol tartrate (LOPRESSOR) 12.5 mg TABS tablet Take 0.5 tablets (12.5 mg total) by mouth 2 (two) times daily. 08/09/14   Noland Fordyce, PA-C  nitroGLYCERIN (NITROSTAT) 0.4 MG SL tablet Place 1 tablet (0.4 mg total) under the tongue every 5 (five) minutes as needed for chest pain (up to 3 doses). 08/09/14   Noland Fordyce, PA-C  omeprazole (PRILOSEC) 20  MG capsule Take 1 capsule (20 mg total) by mouth daily. 08/09/14   Noland Fordyce, PA-C  predniSONE (DELTASONE) 50 MG tablet Take 50mg  once a day for 4 days. 08/09/14   Noland Fordyce, PA-C   BP 154/95  Pulse 102  Temp(Src) 98.3 F (36.8 C) (Oral)  Resp 20  SpO2 93% Physical Exam  Nursing note and vitals reviewed. Constitutional:  Thin male sitting up in exam bed, NAD.  HENT:  Head: Normocephalic and atraumatic.  Eyes: Conjunctivae are normal. No scleral icterus.  Neck: Normal range of motion.  Cardiovascular: Regular rhythm and normal heart sounds.  Tachycardia present.   Pulmonary/Chest: Effort normal. No respiratory distress. He has wheezes. He has rales. He exhibits no tenderness.  Mild SOB, winded between sentences. No accessory muscle use.  Decreased breath sounds in lower lung fields.   Abdominal: Soft. Bowel sounds are normal. He exhibits no distension and no mass. There is no tenderness. There is no rebound and no guarding.  Musculoskeletal: Normal range of motion.  Neurological: He is alert.  Skin: Skin is warm and dry.    ED Course  Procedures (including critical care time) Labs Review Labs Reviewed  BASIC METABOLIC PANEL - Abnormal; Notable for the following:    Sodium 135 (*)    Chloride 90 (*)    Glucose, Bld 101 (*)    All other components within normal limits  CBC  I-STAT TROPOININ, ED    Imaging Review Dg Chest 2 View (if Patient Has Fever And/or Copd)  08/09/2014   CLINICAL DATA:   Patient reports productive cough and shortness of Breath as well as upper anterior chest pain. History of hypertension and COPD. History of smoking.  EXAM: CHEST  2 VIEW  COMPARISON:  07/05/2014.  FINDINGS: Lungs are hyperexpanded consistent with COPD. Lungs are clear. No pleural effusion or pneumothorax.  Normal heart, mediastinum and hila.  There old right-sided rib fractures. Bony thorax otherwise unremarkable.  IMPRESSION: No acute cardiopulmonary disease.  Stable changes of COPD.   Electronically Signed   By: Lajean Manes M.D.   On: 08/09/2014 17:47     EKG Interpretation None      MDM   Final diagnoses:  COPD exacerbation    Pt is a 58yo male with hx of COPD presenting to ED with COPD exacerbation due to having been out of his home medications for 2 weeks.  O2-90% on RA.  CXR: no acute cardiopulmonary disease. Stable changes of COPD.  Pt given solumedrol and duoneb. Pt hypoxic with O2 of 87% on RA with ambulation.  1hr continuous neb tx, improved pt's breathing. Pt states he is feeling better and comfortable being discharged home. Pt able to ambulate in halls, O2-89% on RA, improves to 93-95% on RA while in exam room. Discussed pt with Dr. Mingo Amber who also examined pt. Pt may be discharged home with refill of his home medications. Albuterol inhaler given to pt in ED.  Rx: doxycycline in addition to his regular medications. Advised to f/u with his PCP in 2 days for recheck of symptoms. Return precautions provided. Pt verbalized understanding and agreement with tx plan.   Noland Fordyce, PA-C 08/10/14 [phone]

## 2014-08-09 NOTE — ED Notes (Signed)
Per EMs. Pt from home. Reports he ran out of home copd medications 2 weeks ago. Thought he had another refill, but has to schedule another pcp appointment. Has sob with lying flat, but none while sitting upright. Lungs clear with ems, speaking in full sentences with no difficulty.

## 2014-08-09 NOTE — ED Notes (Signed)
RT notified of continous neb

## 2014-08-09 NOTE — ED Notes (Signed)
PA at bedside.

## 2014-08-10 NOTE — ED Provider Notes (Signed)
Medical screening examination/treatment/procedure(s) were conducted as a shared visit with non-physician practitioner(s) and myself.  I personally evaluated the patient during the encounter.   EKG Interpretation None      Patient here with SOB. Recent URI.  CXR negative for pneumonia. Diffuse wheezing on exam, much improved after albuterol. Patient stable for discharge, given antibiotics and steroids.  Evelina Bucy, MD 08/10/14 [phone]

## 2014-08-30 ENCOUNTER — Emergency Department: Payer: Self-pay | Admitting: Emergency Medicine

## 2014-08-30 LAB — CBC
HCT: 41.1 % (ref 40.0–52.0)
HGB: 13.6 g/dL (ref 13.0–18.0)
MCH: 31.6 pg (ref 26.0–34.0)
MCHC: 33.2 g/dL (ref 32.0–36.0)
MCV: 95 fL (ref 80–100)
Platelet: 220 10*3/uL (ref 150–440)
RBC: 4.31 10*6/uL — AB (ref 4.40–5.90)
RDW: 15 % — ABNORMAL HIGH (ref 11.5–14.5)
WBC: 9.1 10*3/uL (ref 3.8–10.6)

## 2014-08-30 LAB — BASIC METABOLIC PANEL
Anion Gap: 8 (ref 7–16)
BUN: 9 mg/dL (ref 7–18)
Calcium, Total: 8.6 mg/dL (ref 8.5–10.1)
Chloride: 100 mmol/L (ref 98–107)
Co2: 32 mmol/L (ref 21–32)
Creatinine: 0.83 mg/dL (ref 0.60–1.30)
EGFR (Non-African Amer.): >60
Glucose: 123 mg/dL — ABNORMAL HIGH (ref 65–99)
OSMOLALITY: 279 (ref 275–301)
POTASSIUM: 3.9 mmol/L (ref 3.5–5.1)
Sodium: 140 mmol/L (ref 136–145)

## 2014-08-30 LAB — TROPONIN I: Troponin-I: <0.02 ng/mL

## 2014-09-06 ENCOUNTER — Emergency Department: Payer: Self-pay | Admitting: Emergency Medicine

## 2014-09-06 LAB — CBC
HCT: 43.3 % (ref 40.0–52.0)
HGB: 14.5 g/dL (ref 13.0–18.0)
MCH: 31.8 pg (ref 26.0–34.0)
MCHC: 33.5 g/dL (ref 32.0–36.0)
MCV: 95 fL (ref 80–100)
Platelet: 269 10*3/uL (ref 150–440)
RBC: 4.57 10*6/uL (ref 4.40–5.90)
RDW: 15.1 % — ABNORMAL HIGH (ref 11.5–14.5)
WBC: 10.1 10*3/uL (ref 3.8–10.6)

## 2014-09-06 LAB — BASIC METABOLIC PANEL
ANION GAP: 12 (ref 7–16)
BUN: 14 mg/dL (ref 7–18)
CALCIUM: 8.1 mg/dL — AB (ref 8.5–10.1)
CREATININE: 1.07 mg/dL (ref 0.60–1.30)
Chloride: 95 mmol/L — ABNORMAL LOW (ref 98–107)
Co2: 27 mmol/L (ref 21–32)
GLUCOSE: 63 mg/dL — AB (ref 65–99)
Osmolality: 267 (ref 275–301)
Potassium: 4.2 mmol/L (ref 3.5–5.1)
SODIUM: 134 mmol/L — AB (ref 136–145)

## 2014-09-06 LAB — TROPONIN I: Troponin-I: <0.02 ng/mL

## 2014-09-17 ENCOUNTER — Emergency Department: Payer: Self-pay | Admitting: Emergency Medicine

## 2014-09-17 LAB — COMPREHENSIVE METABOLIC PANEL
AST: 22 U/L (ref 15–37)
Albumin: 3.4 g/dL (ref 3.4–5.0)
Alkaline Phosphatase: 63 U/L
Anion Gap: 8 (ref 7–16)
BUN: 13 mg/dL (ref 7–18)
Bilirubin,Total: 0.2 mg/dL (ref 0.2–1.0)
CALCIUM: 8.5 mg/dL (ref 8.5–10.1)
CHLORIDE: 95 mmol/L — AB (ref 98–107)
CO2: 30 mmol/L (ref 21–32)
Creatinine: 1.04 mg/dL (ref 0.60–1.30)
EGFR (Non-African Amer.): >60
Glucose: 79 mg/dL (ref 65–99)
OSMOLALITY: 265 (ref 275–301)
Potassium: 4.4 mmol/L (ref 3.5–5.1)
SGPT (ALT): 26 U/L
SODIUM: 133 mmol/L — AB (ref 136–145)
TOTAL PROTEIN: 6.9 g/dL (ref 6.4–8.2)

## 2014-09-17 LAB — URINALYSIS, COMPLETE
Bacteria: NONE SEEN
Bilirubin,UR: NEGATIVE
Blood: NEGATIVE
GLUCOSE, UR: NEGATIVE mg/dL (ref 0–75)
KETONE: NEGATIVE
Leukocyte Esterase: NEGATIVE
NITRITE: NEGATIVE
PH: 5 (ref 4.5–8.0)
Protein: NEGATIVE
RBC, UR: NONE SEEN /HPF (ref 0–5)
SPECIFIC GRAVITY: 1.004 (ref 1.003–1.030)
Squamous Epithelial: <1
WBC UR: <1 /HPF (ref 0–5)

## 2014-09-17 LAB — DRUG SCREEN, URINE
Amphetamines, Ur Screen: NEGATIVE (ref ?–1000)
BENZODIAZEPINE, UR SCRN: NEGATIVE (ref ?–200)
Barbiturates, Ur Screen: NEGATIVE (ref ?–200)
Cannabinoid 50 Ng, Ur ~~LOC~~: POSITIVE (ref ?–50)
Cocaine Metabolite,Ur ~~LOC~~: NEGATIVE (ref ?–300)
MDMA (Ecstasy)Ur Screen: NEGATIVE (ref ?–500)
Methadone, Ur Screen: NEGATIVE (ref ?–300)
Opiate, Ur Screen: NEGATIVE (ref ?–300)
Phencyclidine (PCP) Ur S: NEGATIVE (ref ?–25)
TRICYCLIC, UR SCREEN: NEGATIVE (ref ?–1000)

## 2014-09-17 LAB — CBC
HCT: 41.3 % (ref 40.0–52.0)
HGB: 13.8 g/dL (ref 13.0–18.0)
MCH: 31.9 pg (ref 26.0–34.0)
MCHC: 33.4 g/dL (ref 32.0–36.0)
MCV: 96 fL (ref 80–100)
PLATELETS: 279 10*3/uL (ref 150–440)
RBC: 4.31 10*6/uL — ABNORMAL LOW (ref 4.40–5.90)
RDW: 14.8 % — AB (ref 11.5–14.5)
WBC: 8.4 10*3/uL (ref 3.8–10.6)

## 2014-09-18 LAB — ETHANOL: ETHANOL LVL: 124 mg/dL

## 2014-09-30 ENCOUNTER — Encounter (HOSPITAL_COMMUNITY): Payer: Self-pay | Admitting: Cardiovascular Disease

## 2014-10-04 ENCOUNTER — Emergency Department: Payer: Self-pay | Admitting: Emergency Medicine

## 2014-10-04 LAB — BASIC METABOLIC PANEL
Anion Gap: 7 (ref 7–16)
BUN: 7 mg/dL (ref 7–18)
CREATININE: 0.85 mg/dL (ref 0.60–1.30)
Calcium, Total: 9 mg/dL (ref 8.5–10.1)
Chloride: 98 mmol/L (ref 98–107)
Co2: 36 mmol/L — ABNORMAL HIGH (ref 21–32)
EGFR (African American): >60
Glucose: 89 mg/dL (ref 65–99)
OSMOLALITY: 279 (ref 275–301)
Potassium: 3.9 mmol/L (ref 3.5–5.1)
Sodium: 141 mmol/L (ref 136–145)

## 2014-10-04 LAB — CBC
HCT: 44.5 % (ref 40.0–52.0)
HGB: 14.1 g/dL (ref 13.0–18.0)
MCH: 30.6 pg (ref 26.0–34.0)
MCHC: 31.6 g/dL — ABNORMAL LOW (ref 32.0–36.0)
MCV: 97 fL (ref 80–100)
Platelet: 244 10*3/uL (ref 150–440)
RBC: 4.59 10*6/uL (ref 4.40–5.90)
RDW: 14.5 % (ref 11.5–14.5)
WBC: 11.7 10*3/uL — ABNORMAL HIGH (ref 3.8–10.6)

## 2014-10-04 LAB — ETHANOL: ETHANOL LVL: 17 mg/dL

## 2014-10-04 LAB — TROPONIN I

## 2014-10-12 ENCOUNTER — Emergency Department: Payer: Self-pay | Admitting: Emergency Medicine

## 2014-10-12 LAB — CBC WITH DIFFERENTIAL/PLATELET
BASOS PCT: 0.8 %
Basophil #: 0.1 10*3/uL (ref 0.0–0.1)
EOS PCT: 1.1 %
Eosinophil #: 0.1 10*3/uL (ref 0.0–0.7)
HCT: 44.4 % (ref 40.0–52.0)
HGB: 14.7 g/dL (ref 13.0–18.0)
Lymphocyte #: 2.1 10*3/uL (ref 1.0–3.6)
Lymphocyte %: 21.7 %
MCH: 31.5 pg (ref 26.0–34.0)
MCHC: 33.2 g/dL (ref 32.0–36.0)
MCV: 95 fL (ref 80–100)
Monocyte #: 1.1 x10 3/mm — ABNORMAL HIGH (ref 0.2–1.0)
Monocyte %: 12.1 %
NEUTROS ABS: 6.1 10*3/uL (ref 1.4–6.5)
NEUTROS PCT: 64.3 %
Platelet: 227 10*3/uL (ref 150–440)
RBC: 4.68 10*6/uL (ref 4.40–5.90)
RDW: 14.3 % (ref 11.5–14.5)
WBC: 9.5 10*3/uL (ref 3.8–10.6)

## 2014-10-12 LAB — URINALYSIS, COMPLETE
BLOOD: NEGATIVE
Bacteria: NONE SEEN
Bilirubin,UR: NEGATIVE
Glucose,UR: NEGATIVE mg/dL (ref 0–75)
Leukocyte Esterase: NEGATIVE
Nitrite: NEGATIVE
Ph: 6 (ref 4.5–8.0)
Protein: NEGATIVE
RBC,UR: <1 /HPF (ref 0–5)
Specific Gravity: 1.011 (ref 1.003–1.030)
Squamous Epithelial: NONE SEEN
WBC UR: NONE SEEN /HPF (ref 0–5)

## 2014-10-12 LAB — TROPONIN I: Troponin-I: <0.02 ng/mL

## 2014-11-22 ENCOUNTER — Emergency Department (HOSPITAL_COMMUNITY): Payer: Medicaid Other

## 2014-11-22 ENCOUNTER — Encounter (HOSPITAL_COMMUNITY): Payer: Self-pay | Admitting: Emergency Medicine

## 2014-11-22 ENCOUNTER — Emergency Department (HOSPITAL_COMMUNITY)
Admission: EM | Admit: 2014-11-22 | Discharge: 2014-11-22 | Disposition: A | Discharge status: Home / Self Care | Payer: Medicaid Other | Attending: Emergency Medicine | Admitting: Emergency Medicine

## 2014-11-22 DIAGNOSIS — G8929 Other chronic pain: Secondary | ICD-10-CM | POA: Diagnosis not present

## 2014-11-22 DIAGNOSIS — Z9889 Other specified postprocedural states: Secondary | ICD-10-CM | POA: Insufficient documentation

## 2014-11-22 DIAGNOSIS — R0682 Tachypnea, not elsewhere classified: Secondary | ICD-10-CM | POA: Insufficient documentation

## 2014-11-22 DIAGNOSIS — F10129 Alcohol abuse with intoxication, unspecified: Secondary | ICD-10-CM | POA: Insufficient documentation

## 2014-11-22 DIAGNOSIS — Z8674 Personal history of sudden cardiac arrest: Secondary | ICD-10-CM | POA: Diagnosis not present

## 2014-11-22 DIAGNOSIS — R109 Unspecified abdominal pain: Secondary | ICD-10-CM | POA: Insufficient documentation

## 2014-11-22 DIAGNOSIS — Z79899 Other long term (current) drug therapy: Secondary | ICD-10-CM | POA: Diagnosis not present

## 2014-11-22 DIAGNOSIS — J449 Chronic obstructive pulmonary disease, unspecified: Secondary | ICD-10-CM | POA: Insufficient documentation

## 2014-11-22 DIAGNOSIS — Z720 Tobacco use: Secondary | ICD-10-CM | POA: Diagnosis not present

## 2014-11-22 DIAGNOSIS — F419 Anxiety disorder, unspecified: Secondary | ICD-10-CM | POA: Diagnosis present

## 2014-11-22 DIAGNOSIS — IMO0002 Reserved for concepts with insufficient information to code with codable children: Secondary | ICD-10-CM

## 2014-11-22 DIAGNOSIS — F1210 Cannabis abuse, uncomplicated: Secondary | ICD-10-CM | POA: Insufficient documentation

## 2014-11-22 DIAGNOSIS — H9190 Unspecified hearing loss, unspecified ear: Secondary | ICD-10-CM | POA: Insufficient documentation

## 2014-11-22 DIAGNOSIS — Z8701 Personal history of pneumonia (recurrent): Secondary | ICD-10-CM | POA: Insufficient documentation

## 2014-11-22 DIAGNOSIS — I2510 Atherosclerotic heart disease of native coronary artery without angina pectoris: Secondary | ICD-10-CM | POA: Diagnosis not present

## 2014-11-22 DIAGNOSIS — I10 Essential (primary) hypertension: Secondary | ICD-10-CM | POA: Diagnosis not present

## 2014-11-22 DIAGNOSIS — I739 Peripheral vascular disease, unspecified: Secondary | ICD-10-CM | POA: Diagnosis not present

## 2014-11-22 DIAGNOSIS — Z7952 Long term (current) use of systemic steroids: Secondary | ICD-10-CM | POA: Diagnosis not present

## 2014-11-22 LAB — CBC WITH DIFFERENTIAL/PLATELET
BASOS ABS: 0.1 10*3/uL (ref 0.0–0.1)
Basophils Relative: 1 % (ref 0–1)
EOS ABS: 0.2 10*3/uL (ref 0.0–0.7)
Eosinophils Relative: 2 % (ref 0–5)
HCT: 46.6 % (ref 39.0–52.0)
Hemoglobin: 15.5 g/dL (ref 13.0–17.0)
LYMPHS ABS: 3.4 10*3/uL (ref 0.7–4.0)
LYMPHS PCT: 42 % (ref 12–46)
MCH: 31.3 pg (ref 26.0–34.0)
MCHC: 33.3 g/dL (ref 30.0–36.0)
MCV: 94 fL (ref 78.0–100.0)
Monocytes Absolute: 0.7 10*3/uL (ref 0.1–1.0)
Monocytes Relative: 8 % (ref 3–12)
Neutro Abs: 3.8 10*3/uL (ref 1.7–7.7)
Neutrophils Relative %: 47 % (ref 43–77)
Platelets: 223 10*3/uL (ref 150–400)
RBC: 4.96 MIL/uL (ref 4.22–5.81)
RDW: 13.7 % (ref 11.5–15.5)
WBC: 8.1 10*3/uL (ref 4.0–10.5)

## 2014-11-22 LAB — URINALYSIS, ROUTINE W REFLEX MICROSCOPIC
BILIRUBIN URINE: NEGATIVE
Glucose, UA: NEGATIVE mg/dL
HGB URINE DIPSTICK: NEGATIVE
KETONES UR: NEGATIVE mg/dL
Leukocytes, UA: NEGATIVE
Nitrite: NEGATIVE
Protein, ur: NEGATIVE mg/dL
Specific Gravity, Urine: 1.006 (ref 1.005–1.030)
Urobilinogen, UA: 0.2 mg/dL (ref 0.0–1.0)
pH: 5 (ref 5.0–8.0)

## 2014-11-22 LAB — ETHANOL: Alcohol, Ethyl (B): 195 mg/dL — ABNORMAL HIGH (ref 0–9)

## 2014-11-22 LAB — RAPID URINE DRUG SCREEN, HOSP PERFORMED
Amphetamines: NOT DETECTED
BARBITURATES: NOT DETECTED
Benzodiazepines: NOT DETECTED
Cocaine: NOT DETECTED
OPIATES: NOT DETECTED
Tetrahydrocannabinol: POSITIVE — AB

## 2014-11-22 LAB — COMPREHENSIVE METABOLIC PANEL
ALK PHOS: 53 U/L (ref 39–117)
ALT: 16 U/L (ref 0–53)
ANION GAP: 10 (ref 5–15)
AST: 24 U/L (ref 0–37)
Albumin: 4.3 g/dL (ref 3.5–5.2)
BUN: 9 mg/dL (ref 6–23)
CALCIUM: 8.8 mg/dL (ref 8.4–10.5)
CO2: 34 mmol/L — ABNORMAL HIGH (ref 19–32)
Chloride: 92 mmol/L — ABNORMAL LOW (ref 96–112)
Creatinine, Ser: 0.87 mg/dL (ref 0.50–1.35)
GFR calc Af Amer: >90 mL/min (ref >90)
GLUCOSE: 75 mg/dL (ref 70–99)
Potassium: 4.3 mmol/L (ref 3.5–5.1)
Sodium: 136 mmol/L (ref 135–145)
Total Bilirubin: 0.2 mg/dL — ABNORMAL LOW (ref 0.3–1.2)
Total Protein: 7.4 g/dL (ref 6.0–8.3)

## 2014-11-22 LAB — AMMONIA: Ammonia: 21 umol/L (ref 11–32)

## 2014-11-22 MED ORDER — IPRATROPIUM-ALBUTEROL 0.5-2.5 (3) MG/3ML IN SOLN
3.0000 mL | Freq: Once | RESPIRATORY_TRACT | Status: AC
  Administered 2014-11-22: 3 mL via RESPIRATORY_TRACT
  Filled 2014-11-22: qty 3

## 2014-11-22 MED ORDER — ALBUTEROL SULFATE (2.5 MG/3ML) 0.083% IN NEBU: 2.5000 mg | INHALATION_SOLUTION | RESPIRATORY_TRACT | Status: DC | PRN

## 2014-11-22 MED ORDER — HYDROGEN PEROXIDE 3 % EX SOLN
CUTANEOUS | Status: AC
  Administered 2014-11-22: 19:00:00
  Filled 2014-11-22: qty 473

## 2014-11-22 NOTE — ED Notes (Signed)
Writer redrew Ammonia on ice, lab sts that it was grossly hemolyzed

## 2014-11-22 NOTE — ED Provider Notes (Signed)
CSN: [id number]     Arrival date & time 11/22/14  1632 History   First MD Initiated Contact with Patient 11/22/14 1647     Chief Complaint  Patient presents with  . Abdominal Pain  . Anxiety     (Consider location/radiation/quality/duration/timing/severity/associated sxs/prior Treatment) HPI  59 year old male with a past history of COPD, EtOH abuse, CAD, and chronically hard of hearing presents with multiple complaints. His primary complaint is difficult to sort out due to but the hearing and given patient appears intoxicated. Patient states she's been confused but cannot tell me for how long. He complains of cough but cannot tell me the onset. Chronic abdominal pain with a colostomy, but states pain has been for 5 years. Hypoxic on arrival, not usually on O2. Ear pain bilaterally for 5 years, has seen ENT in past. Level 5 caveat for altered mental status.  Past Medical History  Diagnosis Date  . COPD (chronic obstructive pulmonary disease)   . History of pneumonia   . Hypertension     a. Normotensive 08/2013 off meds.  [name] HOH (hard of hearing)   . Protein calorie malnutrition   . ETOH abuse   . Tobacco abuse   . Diverticulitis     a. s/p colon resection and colostomy  . Cardiac arrest     a. 01/2013, felt to be 2/2 severe COPD req trach;  b. 01/2013 Echo: EF 65-70% w/o rwma.  . Difficult intubation   . CAD (coronary artery disease)     a. Nonobstructive CAD by cath 08/2013 (mod RCA, mid LAD myocardial bridge) - ECG was concerning for inf STEMI but pt ruled out. CP possibly GERD.   [name] Hyponatremia     a. During 08/2013 felt due to polydipsia.  . Polysubstance abuse     a. Tobacco, marijuana, alcoholism.  [name] Anxiety   . Shortness of breath   . Peripheral vascular disease    Past Surgical History  Procedure Laterality Date  . Hernia repair      inguinal  . Colostomy  12/11/2011    Procedure: COLOSTOMY;  Surgeon: Rolm Bookbinder, MD;  Location: [facility];  Service: General;   Laterality: N/A;  . Colostomy revision  12/11/2011    Procedure: COLON RESECTION SIGMOID;  Surgeon: Rolm Bookbinder, MD;  Location: [facility];  Service: General;;  . Bowel resection  12/11/2011    Procedure: SMALL BOWEL RESECTION;  Surgeon: Rolm Bookbinder, MD;  Location: [facility];  Service: General;;  times 2  . Cystoscopy w/ ureteral stent placement  12/11/2011    Procedure: CYSTOSCOPY WITH STENT REPLACEMENT;  Surgeon: Malka So, MD;  Location: [facility];  Service: Urology;  Laterality: Left;  ureteral catheter placement  . Cardiac catheterization  08/25/2013  . Tracheostomy  01/2013    emergent d/t difficult intubation  . Colon surgery    . Tracheostomy closure    . Left heart catheterization with coronary angiogram N/A 08/25/2013    Procedure: LEFT HEART CATHETERIZATION WITH CORONARY ANGIOGRAM;  Surgeon: Wellington Hampshire, MD;  Location: [facility];  Service: Cardiovascular;  Laterality: N/A;   Family History  Problem Relation Age of Onset  . Heart attack Father   . Cancer Father     unknown type  . HIV Brother    History  Substance Use Topics  . Smoking status: Current Every Day Smoker -- 0.50 packs/day for 44 years    Types: Cigarettes    Last Attempt to Quit: 11/03/2011  . Smokeless tobacco:  Never Used     Comment: Has smoked as much as 2-3 packs/day.  Now can make a pack last a week.  . Alcohol Use: Yes     Comment: Previously drank heavily.  Admits to drinking 1-3 40 oz beers most days of the week.    Review of Systems  Unable to perform ROS: Mental status change      Allergies  Bee venom; Shellfish allergy; and Codeine  Home Medications   Prior to Admission medications   Medication Sig Start Date End Date Taking? Authorizing Provider  albuterol (PROVENTIL) (2.5 MG/3ML) 0.083% nebulizer solution Take 3 mLs (2.5 mg total) by nebulization 3 (three) times daily. 08/09/14   Noland Fordyce, PA-C  albuterol-ipratropium (COMBIVENT) 18-103 MCG/ACT inhaler Inhale 2 puffs into  the lungs every 4 (four) hours as needed for wheezing or shortness of breath. 08/09/14   Noland Fordyce, PA-C  atorvastatin (LIPITOR) 20 MG tablet Take 1 tablet (20 mg total) by mouth every evening. 08/09/14   Noland Fordyce, PA-C  clotrimazole (LOTRIMIN) 1 % cream Apply to affected area 2 times daily 08/10/13   Elmyra Ricks Pisciotta, PA-C  diazepam (VALIUM) 5 MG tablet Take 1 tablet (5 mg total) by mouth 3 (three) times daily. 02/08/14   Tiffany L Reed, DO  docusate sodium 100 MG CAPS Take 100 mg by mouth 2 (two) times daily. 02/06/14   Megan N Dort, PA-C  doxycycline (VIBRAMYCIN) 100 MG capsule Take 1 capsule (100 mg total) by mouth 2 (two) times daily. One po bid x 7 days 08/09/14   Noland Fordyce, PA-C  escitalopram (LEXAPRO) 10 MG tablet Take 1 tablet (10 mg total) by mouth daily. 11/26/13   Annita Brod, MD  folic acid (FOLVITE) 1 MG tablet Take 1 tablet (1 mg total) by mouth daily. 08/27/13   Dayna N Dunn, PA-C  LORazepam (ATIVAN) 0.5 MG tablet Take one to two tablets by mouth every 8 hours as needed for anxiety 03/25/14   Nehemiah Settle A Upstill, PA-C  losartan (COZAAR) 50 MG tablet Take 1 tablet (50 mg total) by mouth daily. 08/09/14   Noland Fordyce, PA-C  metoprolol tartrate (LOPRESSOR) 12.5 mg TABS tablet Take 0.5 tablets (12.5 mg total) by mouth 2 (two) times daily. 08/09/14   Noland Fordyce, PA-C  nitroGLYCERIN (NITROSTAT) 0.4 MG SL tablet Place 1 tablet (0.4 mg total) under the tongue every 5 (five) minutes as needed for chest pain (up to 3 doses). 08/09/14   Noland Fordyce, PA-C  omeprazole (PRILOSEC) 20 MG capsule Take 1 capsule (20 mg total) by mouth daily. 08/09/14   Noland Fordyce, PA-C  predniSONE (DELTASONE) 50 MG tablet Take 50mg  once a day for 4 days. 08/09/14   Noland Fordyce, PA-C  QUEtiapine (SEROQUEL) 50 MG tablet Take 1 tablet (50 mg total) by mouth every morning. 06/28/14   Antonietta Breach, PA-C  risperiDONE (RISPERDAL) 0.5 MG tablet Take 1 tablet (0.5 mg total) by mouth 2 (two) times daily. 11/26/13    Annita Brod, MD  thiamine 100 MG tablet Take 1 tablet (100 mg total) by mouth daily. 01/20/14   Thurnell Lose, MD  tiotropium (SPIRIVA) 18 MCG inhalation capsule Place 1 capsule (18 mcg total) into inhaler and inhale daily. 06/28/14   Antonietta Breach, PA-C  traMADol (ULTRAM) 50 MG tablet Take one tablet by mouth every 6 hours as needed for pain; Take one and 1/2 tablet by mouth every 6 hours as needed for moderate pain; Take two tablets by mouth every 6 hours  as needed for severe pain 03/25/14   Shari A Upstill, PA-C   BP 140/90 mmHg  Pulse 81  Temp(Src) 98.3 F (36.8 C) (Oral)  Resp 20  SpO2 95% Physical Exam  Constitutional: He is oriented to person, place, and time. He appears well-developed and well-nourished.  Very hard of hearing  HENT:  Head: Normocephalic and atraumatic.  Right Ear: External ear normal.  Left Ear: External ear normal.  Nose: Nose normal.  Bilateral ear cerumen  Eyes: Pupils are equal, round, and reactive to light. Right eye exhibits no discharge. Left eye exhibits no discharge.  Neck: Neck supple.  Cardiovascular: Normal rate, regular rhythm, normal heart sounds and intact distal pulses.   Pulmonary/Chest: Effort normal.  Diffuse decreased breath sounds, mild tachypnea  Abdominal: Soft. He exhibits no distension. There is no tenderness.  Left sided colostomy with pink bowel, no obvious bleeding or erythema  Musculoskeletal: He exhibits no edema.  Neurological: He is alert and oriented to person, place, and time.  Skin: Skin is warm and dry.  Nursing note and vitals reviewed.   ED Course  Procedures (including critical care time) Labs Review Labs Reviewed  ETHANOL - Abnormal; Notable for the following:    Alcohol, Ethyl (B) 195 (*)    All other components within normal limits  URINE RAPID DRUG SCREEN (HOSP PERFORMED) - Abnormal; Notable for the following:    Tetrahydrocannabinol POSITIVE (*)    All other components within normal limits  COMPREHENSIVE  METABOLIC PANEL - Abnormal; Notable for the following:    Chloride 92 (*)    CO2 34 (*)    Total Bilirubin 0.2 (*)    All other components within normal limits  CBC WITH DIFFERENTIAL/PLATELET  URINALYSIS, ROUTINE W REFLEX MICROSCOPIC  AMMONIA    Imaging Review Dg Chest 2 View  11/22/2014   CLINICAL DATA:  Anxiety with abdominal pain. Hypoxemia with expiratory wheezing. History of COPD and hypertension. Initial encounter.  EXAM: CHEST  2 VIEW  COMPARISON:  10/04/2014 and 09/17/2014 radiographs.  FINDINGS: The heart size and mediastinal contours are stable. The lungs are hyperinflated but stable in appearance. There is right apical pleural thickening related to old right-sided rib and clavicle fractures. No pleural effusion, pneumothorax or acute osseous abnormalities identified.  IMPRESSION: Stable posttraumatic findings in the upper right hemithorax and pulmonary hyperinflation. No acute findings identified.   Electronically Signed   By: Camie Patience M.D.   On: 11/22/2014 17:35   Ct Head Wo Contrast  11/22/2014   CLINICAL DATA:  Confusion  EXAM: CT HEAD WITHOUT CONTRAST  TECHNIQUE: Contiguous axial images were obtained from the base of the skull through the vertex without intravenous contrast.  COMPARISON:  11/17/2013  FINDINGS: No acute intracranial abnormality. Specifically, no hemorrhage, hydrocephalus, mass lesion, acute infarction, or significant intracranial injury. No acute calvarial abnormality. Visualized paranasal sinuses and mastoids clear. Orbital soft tissues unremarkable.  IMPRESSION: Negative.   Electronically Signed   By: Rolm Baptise M.D.   On: 11/22/2014 17:27     EKG Interpretation   Date/Time:  Monday November 22 2014 17:32:27 EST Ventricular Rate:  77 PR Interval:  143 QRS Duration: 98 QT Interval:  408 QTC Calculation: 462 R Axis:   99 Text Interpretation:  Sinus rhythm Biatrial enlargement Borderline right  axis deviation Abnormal R-wave progression, late transition  Nonspecific T  abnrm, anterolateral leads Baseline wander in lead(s) V1 V2 V3 V4 V6 no  significant change since Oct 2015 Confirmed by Regenia Skeeter  MD, Nicki Reaper (  [id number])  on 11/22/2014 5:53:30 PM      MDM   Final diagnoses:  Intoxication    Patient is initially altered and history is very difficult. Patient later endorse that he smoked marijuana just prior to coming in. He also has evidence of alcohol intoxication. Patient gradually sobered while in the ER. After albumin treatments his sats also increased and he is no longer requiring oxygen. Initially likely had COPD and depressants causing his epoxy up. Able to ambulate without difficulty. Stable for discharge at this time, son will come pick up patient. Is no longer confused, stable to be followed up as an outpatient.    Ephraim Hamburger, MD 11/23/14 [phone]

## 2014-11-22 NOTE — ED Notes (Signed)
Pt from home via [facility] c/o anxiety and abdominal pain. Per EMS patient was smoking marijuana. Pt reports that he had 3 beers today. Sats 85% RA. Expiratory wheezes.

## 2014-11-22 NOTE — ED Notes (Signed)
Patient transported to CT 

## 2014-11-22 NOTE — ED Notes (Signed)
Patient stated to this tech that before coming to the ED he had smoked a bowl. Patient also stated to this tech that he could get some good 400.00 dollar weed if i liked. Then patient apologized to this tech for previous statements.

## 2014-11-22 NOTE — ED Notes (Signed)
Redrew labs on patient per lab. Sent down a lt green, dk green, blue, and lavender

## 2014-11-22 NOTE — ED Notes (Signed)
Pt placed in bed with 2 L of O2 and sats at 95%. Pt in the 80's in Triage.

## 2014-11-23 ENCOUNTER — Encounter (HOSPITAL_COMMUNITY): Payer: Self-pay

## 2014-11-23 ENCOUNTER — Emergency Department (HOSPITAL_COMMUNITY)
Admission: EM | Admit: 2014-11-23 | Discharge: 2014-11-23 | Disposition: A | Discharge status: Home / Self Care | Payer: Medicaid Other | Attending: Emergency Medicine | Admitting: Emergency Medicine

## 2014-11-23 DIAGNOSIS — Z8674 Personal history of sudden cardiac arrest: Secondary | ICD-10-CM | POA: Diagnosis not present

## 2014-11-23 DIAGNOSIS — Z792 Long term (current) use of antibiotics: Secondary | ICD-10-CM | POA: Insufficient documentation

## 2014-11-23 DIAGNOSIS — Z8719 Personal history of other diseases of the digestive system: Secondary | ICD-10-CM | POA: Diagnosis not present

## 2014-11-23 DIAGNOSIS — Z9889 Other specified postprocedural states: Secondary | ICD-10-CM | POA: Diagnosis not present

## 2014-11-23 DIAGNOSIS — F419 Anxiety disorder, unspecified: Secondary | ICD-10-CM | POA: Diagnosis not present

## 2014-11-23 DIAGNOSIS — H9190 Unspecified hearing loss, unspecified ear: Secondary | ICD-10-CM | POA: Diagnosis not present

## 2014-11-23 DIAGNOSIS — Z79899 Other long term (current) drug therapy: Secondary | ICD-10-CM | POA: Diagnosis not present

## 2014-11-23 DIAGNOSIS — R0602 Shortness of breath: Secondary | ICD-10-CM | POA: Diagnosis present

## 2014-11-23 DIAGNOSIS — Z720 Tobacco use: Secondary | ICD-10-CM | POA: Insufficient documentation

## 2014-11-23 DIAGNOSIS — I10 Essential (primary) hypertension: Secondary | ICD-10-CM | POA: Insufficient documentation

## 2014-11-23 DIAGNOSIS — I2510 Atherosclerotic heart disease of native coronary artery without angina pectoris: Secondary | ICD-10-CM | POA: Insufficient documentation

## 2014-11-23 DIAGNOSIS — Z8639 Personal history of other endocrine, nutritional and metabolic disease: Secondary | ICD-10-CM | POA: Insufficient documentation

## 2014-11-23 DIAGNOSIS — Z8701 Personal history of pneumonia (recurrent): Secondary | ICD-10-CM | POA: Insufficient documentation

## 2014-11-23 DIAGNOSIS — J441 Chronic obstructive pulmonary disease with (acute) exacerbation: Secondary | ICD-10-CM | POA: Diagnosis not present

## 2014-11-23 MED ORDER — PREDNISONE 20 MG PO TABS: 60.0000 mg | ORAL_TABLET | Freq: Every day | ORAL | Status: DC

## 2014-11-23 MED ORDER — ALBUTEROL SULFATE HFA 108 (90 BASE) MCG/ACT IN AERS
1.0000 | INHALATION_SPRAY | RESPIRATORY_TRACT | Status: DC | PRN
  Filled 2014-11-23: qty 6.7

## 2014-11-23 MED ORDER — PREDNISONE 20 MG PO TABS
60.0000 mg | ORAL_TABLET | Freq: Once | ORAL | Status: AC
  Administered 2014-11-23: 60 mg via ORAL
  Filled 2014-11-23: qty 3

## 2014-11-23 MED ORDER — AZITHROMYCIN 250 MG PO TABS: 250.0000 mg | ORAL_TABLET | Freq: Every day | ORAL | Status: DC

## 2014-11-23 MED ORDER — IPRATROPIUM-ALBUTEROL 0.5-2.5 (3) MG/3ML IN SOLN
3.0000 mL | Freq: Once | RESPIRATORY_TRACT | Status: AC
  Administered 2014-11-23: 3 mL via RESPIRATORY_TRACT
  Filled 2014-11-23: qty 3

## 2014-11-23 MED ORDER — AZITHROMYCIN 250 MG PO TABS
500.0000 mg | ORAL_TABLET | Freq: Once | ORAL | Status: AC
  Administered 2014-11-23: 500 mg via ORAL
  Filled 2014-11-23: qty 2

## 2014-11-23 NOTE — ED Notes (Signed)
Awake. Verbally responsive. A/O x4. Resp even and unlabored. Occ productive cough of white thick sputum. Sats 89% RA and started O2 at 2lpm via [place]. ABC's intact. SR with occ PVC's  on monitor at 86bpm. Pt HOH. Pt reported having productive cough since 08/2014.

## 2014-11-23 NOTE — Discharge Instructions (Signed)
Chronic Obstructive Pulmonary Disease Exacerbation ° Chronic obstructive pulmonary disease (COPD) is a common lung problem. In COPD, the flow of air from the lungs is limited. COPD exacerbations are times that breathing gets worse and you need extra treatment. Without treatment they can be life threatening. If they happen often, your lungs can become more damaged. °HOME CARE °· Do not smoke. °· Avoid tobacco smoke and other things that bother your lungs. °· If given, take your antibiotic medicine as told. Finish the medicine even if you start to feel better. °· Only take medicines as told by your doctor. °· Drink enough fluids to keep your pee (urine) clear or pale yellow (unless your doctor has told you not to). °· Use a cool mist machine (vaporizer). °· If you use oxygen or a machine that turns liquid medicine into a mist (nebulizer), continue to use them as told. °· Keep up with shots (vaccinations) as told by your doctor. °· Exercise regularly. °· Eat healthy foods. °· Keep all doctor visits as told. °GET HELP RIGHT AWAY IF: °· You are very short of breath and it gets worse. °· You have trouble talking. °· You have bad chest pain. °· You have blood in your spit (sputum). °· You have a fever. °· You keep throwing up (vomiting). °· You feel weak, or you pass out (faint). °· You feel confused. °· You keep getting worse. °MAKE SURE YOU:  °· Understand these instructions. °· Will watch your condition. °· Will get help right away if you are not doing well or get worse. °Document Released: 09/27/2011 Document Revised: 07/29/2013 Document Reviewed: 06/12/2013 °[employer]® Patient Information ©2015 [employer]. This information is not intended to replace advice given to you by your health care provider. Make sure you discuss any questions you have with your health care provider. ° °

## 2014-11-23 NOTE — ED Notes (Signed)
Pt pulse was 82 while ambulating. Pt oxygen level stayed around 90-91

## 2014-11-23 NOTE — ED Provider Notes (Signed)
CSN: [id number]     Arrival date & time 11/23/14  0401 History   First MD Initiated Contact with Patient 11/23/14 0445     Chief Complaint  Patient presents with  . Shortness of Breath     (Consider location/radiation/quality/duration/timing/severity/associated sxs/prior Treatment) HPI 59 yo male presents to the ER from the lobby where he has been waiting for a ride since being d/c at 11pm.  Pt c/o head pressure, ears leaking, poor appetite for 3-4 months, shortness of breath and sour taste in mouth.  Pt seen earlier tonight for chronic abdominal pain, anxiety, shortness of breath.  Pt was intoxicated upon his initial arrival.  Pt sobered, received breathing tx and was stable for D/C. Past Medical History  Diagnosis Date  . COPD (chronic obstructive pulmonary disease)   . History of pneumonia   . Hypertension     a. Normotensive 08/2013 off meds.  [name] HOH (hard of hearing)   . Protein calorie malnutrition   . ETOH abuse   . Tobacco abuse   . Diverticulitis     a. s/p colon resection and colostomy  . Cardiac arrest     a. 01/2013, felt to be 2/2 severe COPD req trach;  b. 01/2013 Echo: EF 65-70% w/o rwma.  . Difficult intubation   . CAD (coronary artery disease)     a. Nonobstructive CAD by cath 08/2013 (mod RCA, mid LAD myocardial bridge) - ECG was concerning for inf STEMI but pt ruled out. CP possibly GERD.   [name] Hyponatremia     a. During 08/2013 felt due to polydipsia.  . Polysubstance abuse     a. Tobacco, marijuana, alcoholism.  [name] Anxiety   . Shortness of breath   . Peripheral vascular disease    Past Surgical History  Procedure Laterality Date  . Hernia repair      inguinal  . Colostomy  12/11/2011    Procedure: COLOSTOMY;  Surgeon: Rolm Bookbinder, MD;  Location: [facility];  Service: General;  Laterality: N/A;  . Colostomy revision  12/11/2011    Procedure: COLON RESECTION SIGMOID;  Surgeon: Rolm Bookbinder, MD;  Location: [facility];  Service: General;;  . Bowel resection   12/11/2011    Procedure: SMALL BOWEL RESECTION;  Surgeon: Rolm Bookbinder, MD;  Location: [facility];  Service: General;;  times 2  . Cystoscopy w/ ureteral stent placement  12/11/2011    Procedure: CYSTOSCOPY WITH STENT REPLACEMENT;  Surgeon: Malka So, MD;  Location: [facility];  Service: Urology;  Laterality: Left;  ureteral catheter placement  . Cardiac catheterization  08/25/2013  . Tracheostomy  01/2013    emergent d/t difficult intubation  . Colon surgery    . Tracheostomy closure    . Left heart catheterization with coronary angiogram N/A 08/25/2013    Procedure: LEFT HEART CATHETERIZATION WITH CORONARY ANGIOGRAM;  Surgeon: Wellington Hampshire, MD;  Location: [facility];  Service: Cardiovascular;  Laterality: N/A;   Family History  Problem Relation Age of Onset  . Heart attack Father   . Cancer Father     unknown type  . HIV Brother    History  Substance Use Topics  . Smoking status: Current Every Day Smoker -- 0.50 packs/day for 44 years    Types: Cigarettes    Last Attempt to Quit: 11/03/2011  . Smokeless tobacco: Never Used     Comment: Has smoked as much as 2-3 packs/day.  Now can make a pack last a week.  . Alcohol Use: Yes  Comment: Previously drank heavily.  Admits to drinking 1-3 40 oz beers most days of the week.    Review of Systems  Unable to perform ROS  Hard of hearing   Allergies  Bee venom; Shellfish allergy; and Codeine  Home Medications   Prior to Admission medications   Medication Sig Start Date End Date Taking? Authorizing Provider  atorvastatin (LIPITOR) 20 MG tablet Take 1 tablet (20 mg total) by mouth every evening. 08/09/14  Yes Noland Fordyce, PA-C  diazepam (VALIUM) 5 MG tablet Take 1 tablet (5 mg total) by mouth 3 (three) times daily. 02/08/14  Yes Tiffany L Reed, DO  docusate sodium 100 MG CAPS Take 100 mg by mouth 2 (two) times daily. 02/06/14  Yes Megan N Dort, PA-C  folic acid (FOLVITE) 1 MG tablet Take 1 tablet (1 mg total) by mouth daily.  08/27/13  Yes Dayna N Dunn, PA-C  metoprolol tartrate (LOPRESSOR) 25 MG tablet Take 25 mg by mouth 2 (two) times daily.   Yes Historical Provider, MD  omeprazole (PRILOSEC) 20 MG capsule Take 1 capsule (20 mg total) by mouth daily. 08/09/14  Yes Noland Fordyce, PA-C  QUEtiapine (SEROQUEL) 50 MG tablet Take 1 tablet (50 mg total) by mouth every morning. 06/28/14  Yes Antonietta Breach, PA-C  risperiDONE (RISPERDAL) 0.5 MG tablet Take 1 tablet (0.5 mg total) by mouth 2 (two) times daily. 11/26/13  Yes Annita Brod, MD  thiamine 100 MG tablet Take 1 tablet (100 mg total) by mouth daily. 01/20/14  Yes Thurnell Lose, MD  tiotropium (SPIRIVA) 18 MCG inhalation capsule Place 1 capsule (18 mcg total) into inhaler and inhale daily. 06/28/14  Yes Antonietta Breach, PA-C  albuterol (PROVENTIL) (2.5 MG/3ML) 0.083% nebulizer solution Take 3 mLs (2.5 mg total) by nebulization every 4 (four) hours as needed for wheezing or shortness of breath. 11/22/14   Ephraim Hamburger, MD  albuterol-ipratropium (COMBIVENT) 18-103 MCG/ACT inhaler Inhale 2 puffs into the lungs every 4 (four) hours as needed for wheezing or shortness of breath. 08/09/14   Noland Fordyce, PA-C  clotrimazole (LOTRIMIN) 1 % cream Apply to affected area 2 times daily Patient not taking: Reported on 11/22/2014 08/10/13   Elmyra Ricks Pisciotta, PA-C  doxycycline (VIBRAMYCIN) 100 MG capsule Take 1 capsule (100 mg total) by mouth 2 (two) times daily. One po bid x 7 days Patient not taking: Reported on 11/22/2014 08/09/14   Noland Fordyce, PA-C  escitalopram (LEXAPRO) 10 MG tablet Take 1 tablet (10 mg total) by mouth daily. 11/26/13   Annita Brod, MD  LORazepam (ATIVAN) 0.5 MG tablet Take one to two tablets by mouth every 8 hours as needed for anxiety 03/25/14   Nehemiah Settle A Upstill, PA-C  losartan (COZAAR) 50 MG tablet Take 1 tablet (50 mg total) by mouth daily. 08/09/14   Noland Fordyce, PA-C  metoprolol tartrate (LOPRESSOR) 12.5 mg TABS tablet Take 0.5 tablets (12.5 mg total) by mouth  2 (two) times daily. Patient not taking: Reported on 11/22/2014 08/09/14   Noland Fordyce, PA-C  nitroGLYCERIN (NITROSTAT) 0.4 MG SL tablet Place 1 tablet (0.4 mg total) under the tongue every 5 (five) minutes as needed for chest pain (up to 3 doses). 08/09/14   Noland Fordyce, PA-C  predniSONE (DELTASONE) 50 MG tablet Take 50mg  once a day for 4 days. Patient not taking: Reported on 11/22/2014 08/09/14   Noland Fordyce, PA-C  traMADol (ULTRAM) 50 MG tablet Take one tablet by mouth every 6 hours as needed for pain; Take one  and 1/2 tablet by mouth every 6 hours as needed for moderate pain; Take two tablets by mouth every 6 hours as needed for severe pain Patient not taking: Reported on 11/22/2014 03/25/14   Nehemiah Settle A Upstill, PA-C   BP 151/93 mmHg  Pulse 90  Temp(Src) 98.1 F (36.7 C) (Oral)  Resp 16  SpO2 93% Physical Exam  Constitutional: He is oriented to person, place, and time. He appears well-developed and well-nourished. No distress.  HENT:  Head: Normocephalic and atraumatic.  Nose: Nose normal.  Mouth/Throat: Oropharynx is clear and moist.  Poor dental hygeine  Eyes: Conjunctivae and EOM are normal. Pupils are equal, round, and reactive to light.  Neck: Normal range of motion. Neck supple. No JVD present. No tracheal deviation present. No thyromegaly present.  Cardiovascular: Normal rate, regular rhythm, normal heart sounds and intact distal pulses.  Exam reveals no gallop and no friction rub.   No murmur heard. Pulmonary/Chest: Effort normal. No stridor. No respiratory distress. He has no wheezes. He has no rales. He exhibits no tenderness.  Prolonged expiratory phase  Abdominal: Soft. Bowel sounds are normal. He exhibits no distension and no mass. There is no tenderness. There is no rebound and no guarding.  Musculoskeletal: Normal range of motion. He exhibits no edema or tenderness.  Lymphadenopathy:    He has no cervical adenopathy.  Neurological: He is alert and oriented to person,  place, and time. He displays normal reflexes. He exhibits normal muscle tone. Coordination normal.  Skin: Skin is warm and dry. No rash noted. No erythema. No pallor.  Nursing note and vitals reviewed.   ED Course  Procedures (including critical care time) Labs Review Labs Reviewed - No data to display  Imaging Review Dg Chest 2 View  11/22/2014   CLINICAL DATA:  Anxiety with abdominal pain. Hypoxemia with expiratory wheezing. History of COPD and hypertension. Initial encounter.  EXAM: CHEST  2 VIEW  COMPARISON:  10/04/2014 and 09/17/2014 radiographs.  FINDINGS: The heart size and mediastinal contours are stable. The lungs are hyperinflated but stable in appearance. There is right apical pleural thickening related to old right-sided rib and clavicle fractures. No pleural effusion, pneumothorax or acute osseous abnormalities identified.  IMPRESSION: Stable posttraumatic findings in the upper right hemithorax and pulmonary hyperinflation. No acute findings identified.   Electronically Signed   By: Camie Patience M.D.   On: 11/22/2014 17:35   Ct Head Wo Contrast  11/22/2014   CLINICAL DATA:  Confusion  EXAM: CT HEAD WITHOUT CONTRAST  TECHNIQUE: Contiguous axial images were obtained from the base of the skull through the vertex without intravenous contrast.  COMPARISON:  11/17/2013  FINDINGS: No acute intracranial abnormality. Specifically, no hemorrhage, hydrocephalus, mass lesion, acute infarction, or significant intracranial injury. No acute calvarial abnormality. Visualized paranasal sinuses and mastoids clear. Orbital soft tissues unremarkable.  IMPRESSION: Negative.   Electronically Signed   By: Rolm Baptise M.D.   On: 11/22/2014 17:27     EKG Interpretation   Date/Time:  Tuesday November 23 2014 04:29:13 EST Ventricular Rate:  92 PR Interval:  130 QRS Duration: 90 QT Interval:  376 QTC Calculation: 465 R Axis:   103 Text Interpretation:  Sinus rhythm Ventricular premature complex Right   atrial enlargement Right axis deviation No significant change since last  tracing Confirmed by Zillah Alexie  MD, Ivan Lacher ([id number]) on 11/23/2014 4:36:35 AM      MDM   Final diagnoses:  COPD exacerbation    59 yo male with persistent dyspnea,  02 sats on room air borderline.  Will give neb, prednisone, ambulate.  The rest of his complaints are chronic in nature, and can be f/u with pcm.    Kalman Drape, MD 11/23/14 [phone]

## 2014-11-23 NOTE — ED Notes (Signed)
Awake. Verbally responsive. A/O x4. Resp even and unlabored. No audible adventitious breath sounds noted. ABC's intact.  

## 2014-11-23 NOTE — ED Notes (Signed)
Patient has been in lobby since he was discharged several hours ago.  He reports now he is short of breath and has a bad taste in his mouth.

## 2014-11-23 NOTE — ED Notes (Signed)
Awake. Verbally responsive. A/O x4. Resp even and unlabored. Occ productive cough of thick white sputum. ABC's intact. SR on monitor at 97bpm.

## 2014-12-04 ENCOUNTER — Inpatient Hospital Stay (HOSPITAL_COMMUNITY)
Admission: EM | Admit: 2014-12-04 | Discharge: 2014-12-06 | DRG: 190 | Disposition: A | Discharge status: Home Health | Payer: Medicaid Other | Attending: Internal Medicine | Admitting: Internal Medicine

## 2014-12-04 ENCOUNTER — Emergency Department (HOSPITAL_COMMUNITY): Payer: Medicaid Other

## 2014-12-04 ENCOUNTER — Encounter (HOSPITAL_COMMUNITY): Payer: Self-pay | Admitting: Emergency Medicine

## 2014-12-04 DIAGNOSIS — Z9049 Acquired absence of other specified parts of digestive tract: Secondary | ICD-10-CM | POA: Diagnosis present

## 2014-12-04 DIAGNOSIS — Z8249 Family history of ischemic heart disease and other diseases of the circulatory system: Secondary | ICD-10-CM

## 2014-12-04 DIAGNOSIS — I469 Cardiac arrest, cause unspecified: Secondary | ICD-10-CM | POA: Diagnosis present

## 2014-12-04 DIAGNOSIS — Z720 Tobacco use: Secondary | ICD-10-CM | POA: Diagnosis present

## 2014-12-04 DIAGNOSIS — F419 Anxiety disorder, unspecified: Secondary | ICD-10-CM | POA: Diagnosis present

## 2014-12-04 DIAGNOSIS — Z8674 Personal history of sudden cardiac arrest: Secondary | ICD-10-CM

## 2014-12-04 DIAGNOSIS — Z91030 Bee allergy status: Secondary | ICD-10-CM | POA: Diagnosis not present

## 2014-12-04 DIAGNOSIS — R109 Unspecified abdominal pain: Secondary | ICD-10-CM

## 2014-12-04 DIAGNOSIS — F17210 Nicotine dependence, cigarettes, uncomplicated: Secondary | ICD-10-CM | POA: Diagnosis present

## 2014-12-04 DIAGNOSIS — IMO0002 Reserved for concepts with insufficient information to code with codable children: Secondary | ICD-10-CM | POA: Insufficient documentation

## 2014-12-04 DIAGNOSIS — J9620 Acute and chronic respiratory failure, unspecified whether with hypoxia or hypercapnia: Secondary | ICD-10-CM

## 2014-12-04 DIAGNOSIS — I739 Peripheral vascular disease, unspecified: Secondary | ICD-10-CM | POA: Diagnosis present

## 2014-12-04 DIAGNOSIS — Z681 Body mass index (BMI) 19 or less, adult: Secondary | ICD-10-CM

## 2014-12-04 DIAGNOSIS — Z8701 Personal history of pneumonia (recurrent): Secondary | ICD-10-CM

## 2014-12-04 DIAGNOSIS — Z809 Family history of malignant neoplasm, unspecified: Secondary | ICD-10-CM | POA: Diagnosis not present

## 2014-12-04 DIAGNOSIS — I2510 Atherosclerotic heart disease of native coronary artery without angina pectoris: Secondary | ICD-10-CM | POA: Diagnosis present

## 2014-12-04 DIAGNOSIS — H9190 Unspecified hearing loss, unspecified ear: Secondary | ICD-10-CM | POA: Diagnosis present

## 2014-12-04 DIAGNOSIS — Z79899 Other long term (current) drug therapy: Secondary | ICD-10-CM

## 2014-12-04 DIAGNOSIS — J9621 Acute and chronic respiratory failure with hypoxia: Secondary | ICD-10-CM | POA: Diagnosis present

## 2014-12-04 DIAGNOSIS — E785 Hyperlipidemia, unspecified: Secondary | ICD-10-CM | POA: Diagnosis present

## 2014-12-04 DIAGNOSIS — Z933 Colostomy status: Secondary | ICD-10-CM

## 2014-12-04 DIAGNOSIS — R1084 Generalized abdominal pain: Secondary | ICD-10-CM

## 2014-12-04 DIAGNOSIS — F1010 Alcohol abuse, uncomplicated: Secondary | ICD-10-CM

## 2014-12-04 DIAGNOSIS — K219 Gastro-esophageal reflux disease without esophagitis: Secondary | ICD-10-CM | POA: Diagnosis present

## 2014-12-04 DIAGNOSIS — J441 Chronic obstructive pulmonary disease with (acute) exacerbation: Principal | ICD-10-CM

## 2014-12-04 DIAGNOSIS — I10 Essential (primary) hypertension: Secondary | ICD-10-CM | POA: Diagnosis present

## 2014-12-04 DIAGNOSIS — R112 Nausea with vomiting, unspecified: Secondary | ICD-10-CM | POA: Diagnosis not present

## 2014-12-04 DIAGNOSIS — J449 Chronic obstructive pulmonary disease, unspecified: Secondary | ICD-10-CM | POA: Diagnosis present

## 2014-12-04 DIAGNOSIS — E43 Unspecified severe protein-calorie malnutrition: Secondary | ICD-10-CM | POA: Diagnosis present

## 2014-12-04 DIAGNOSIS — R110 Nausea: Secondary | ICD-10-CM

## 2014-12-04 DIAGNOSIS — Z91013 Allergy to seafood: Secondary | ICD-10-CM | POA: Diagnosis not present

## 2014-12-04 DIAGNOSIS — F1920 Other psychoactive substance dependence, uncomplicated: Secondary | ICD-10-CM | POA: Diagnosis present

## 2014-12-04 DIAGNOSIS — Z8719 Personal history of other diseases of the digestive system: Secondary | ICD-10-CM

## 2014-12-04 DIAGNOSIS — Z885 Allergy status to narcotic agent status: Secondary | ICD-10-CM

## 2014-12-04 DIAGNOSIS — K210 Gastro-esophageal reflux disease with esophagitis: Secondary | ICD-10-CM

## 2014-12-04 DIAGNOSIS — R0902 Hypoxemia: Secondary | ICD-10-CM

## 2014-12-04 LAB — COMPREHENSIVE METABOLIC PANEL
ALK PHOS: 52 U/L (ref 39–117)
ALT: 36 U/L (ref 0–53)
ANION GAP: 9 (ref 5–15)
AST: 31 U/L (ref 0–37)
Albumin: 3.8 g/dL (ref 3.5–5.2)
BUN: 15 mg/dL (ref 6–23)
CALCIUM: 8.5 mg/dL (ref 8.4–10.5)
CO2: 33 mmol/L — ABNORMAL HIGH (ref 19–32)
CREATININE: 0.69 mg/dL (ref 0.50–1.35)
Chloride: 96 mmol/L (ref 96–112)
GFR calc Af Amer: >90 mL/min (ref >90)
GFR calc non Af Amer: >90 mL/min (ref >90)
Glucose, Bld: 102 mg/dL — ABNORMAL HIGH (ref 70–99)
Potassium: 4.1 mmol/L (ref 3.5–5.1)
SODIUM: 138 mmol/L (ref 135–145)
Total Bilirubin: 0.6 mg/dL (ref 0.3–1.2)
Total Protein: 6.7 g/dL (ref 6.0–8.3)

## 2014-12-04 LAB — URINALYSIS, ROUTINE W REFLEX MICROSCOPIC
Bilirubin Urine: NEGATIVE
Glucose, UA: NEGATIVE mg/dL
Hgb urine dipstick: NEGATIVE
Ketones, ur: NEGATIVE mg/dL
LEUKOCYTES UA: NEGATIVE
Nitrite: NEGATIVE
Protein, ur: 30 mg/dL — AB
SPECIFIC GRAVITY, URINE: 1.019 (ref 1.005–1.030)
UROBILINOGEN UA: 1 mg/dL (ref 0.0–1.0)
pH: 7.5 (ref 5.0–8.0)

## 2014-12-04 LAB — CBC
HCT: 45 % (ref 39.0–52.0)
Hemoglobin: 14.9 g/dL (ref 13.0–17.0)
MCH: 30.9 pg (ref 26.0–34.0)
MCHC: 33.1 g/dL (ref 30.0–36.0)
MCV: 93.4 fL (ref 78.0–100.0)
Platelets: 246 10*3/uL (ref 150–400)
RBC: 4.82 MIL/uL (ref 4.22–5.81)
RDW: 13.5 % (ref 11.5–15.5)
WBC: 11.4 10*3/uL — AB (ref 4.0–10.5)

## 2014-12-04 LAB — LIPASE, BLOOD: LIPASE: 44 U/L (ref 11–59)

## 2014-12-04 LAB — URINE MICROSCOPIC-ADD ON: Urine-Other: NONE SEEN

## 2014-12-04 LAB — I-STAT TROPONIN, ED: Troponin i, poc: 0.01 ng/mL (ref 0.00–0.08)

## 2014-12-04 MED ORDER — AZITHROMYCIN 250 MG PO TABS
250.0000 mg | ORAL_TABLET | Freq: Every day | ORAL | Status: DC
  Administered 2014-12-05: 250 mg via ORAL
  Filled 2014-12-04 (×2): qty 1

## 2014-12-04 MED ORDER — AZITHROMYCIN 500 MG PO TABS
500.0000 mg | ORAL_TABLET | Freq: Every day | ORAL | Status: AC
  Administered 2014-12-05: 500 mg via ORAL
  Filled 2014-12-04: qty 1

## 2014-12-04 MED ORDER — IOHEXOL 300 MG/ML  SOLN
100.0000 mL | Freq: Once | INTRAMUSCULAR | Status: AC | PRN
  Administered 2014-12-04: 100 mL via INTRAVENOUS

## 2014-12-04 MED ORDER — ONDANSETRON HCL 4 MG/2ML IJ SOLN: 4.0000 mg | Freq: Four times a day (QID) | INTRAMUSCULAR | Status: DC | PRN

## 2014-12-04 MED ORDER — LOSARTAN POTASSIUM 50 MG PO TABS
50.0000 mg | ORAL_TABLET | Freq: Every day | ORAL | Status: DC
  Administered 2014-12-05 – 2014-12-06 (×2): 50 mg via ORAL
  Filled 2014-12-04 (×2): qty 1

## 2014-12-04 MED ORDER — RISPERIDONE 0.5 MG PO TABS
0.5000 mg | ORAL_TABLET | Freq: Two times a day (BID) | ORAL | Status: DC
  Administered 2014-12-05 – 2014-12-06 (×4): 0.5 mg via ORAL
  Filled 2014-12-04 (×5): qty 1

## 2014-12-04 MED ORDER — FOLIC ACID 1 MG PO TABS
1.0000 mg | ORAL_TABLET | Freq: Every day | ORAL | Status: DC
  Administered 2014-12-05 – 2014-12-06 (×2): 1 mg via ORAL
  Filled 2014-12-04 (×2): qty 1

## 2014-12-04 MED ORDER — IPRATROPIUM BROMIDE 0.02 % IN SOLN
0.5000 mg | Freq: Once | RESPIRATORY_TRACT | Status: AC
  Administered 2014-12-04: 0.5 mg via RESPIRATORY_TRACT
  Filled 2014-12-04: qty 2.5

## 2014-12-04 MED ORDER — PANTOPRAZOLE SODIUM 40 MG PO TBEC
40.0000 mg | DELAYED_RELEASE_TABLET | Freq: Every day | ORAL | Status: DC
  Administered 2014-12-05 – 2014-12-06 (×2): 40 mg via ORAL
  Filled 2014-12-04 (×2): qty 1

## 2014-12-04 MED ORDER — NITROGLYCERIN 0.4 MG SL SUBL: 0.4000 mg | SUBLINGUAL_TABLET | SUBLINGUAL | Status: DC | PRN

## 2014-12-04 MED ORDER — HEPARIN SODIUM (PORCINE) 5000 UNIT/ML IJ SOLN
5000.0000 [IU] | Freq: Three times a day (TID) | INTRAMUSCULAR | Status: DC
  Administered 2014-12-05 – 2014-12-06 (×4): 5000 [IU] via SUBCUTANEOUS
  Filled 2014-12-04 (×7): qty 1

## 2014-12-04 MED ORDER — IOHEXOL 300 MG/ML  SOLN
50.0000 mL | Freq: Once | INTRAMUSCULAR | Status: AC | PRN
  Administered 2014-12-04: 50 mL via ORAL

## 2014-12-04 MED ORDER — LORAZEPAM 2 MG/ML IJ SOLN
0.0000 mg | Freq: Four times a day (QID) | INTRAMUSCULAR | Status: DC
Start: 2014-12-04 — End: 2014-12-06
  Administered 2014-12-05 (×4): 1 mg via INTRAVENOUS
  Administered 2014-12-05: 2 mg via INTRAVENOUS
  Filled 2014-12-04 (×6): qty 1

## 2014-12-04 MED ORDER — THIAMINE HCL 100 MG/ML IJ SOLN
100.0000 mg | Freq: Every day | INTRAMUSCULAR | Status: DC
  Filled 2014-12-04 (×2): qty 1

## 2014-12-04 MED ORDER — QUETIAPINE FUMARATE 50 MG PO TABS
50.0000 mg | ORAL_TABLET | Freq: Every morning | ORAL | Status: DC
  Administered 2014-12-05 – 2014-12-06 (×2): 50 mg via ORAL
  Filled 2014-12-04 (×2): qty 1

## 2014-12-04 MED ORDER — SODIUM CHLORIDE 0.9 % IV SOLN
INTRAVENOUS | Status: DC
  Administered 2014-12-05 (×2): via INTRAVENOUS

## 2014-12-04 MED ORDER — MORPHINE SULFATE 4 MG/ML IJ SOLN
4.0000 mg | Freq: Once | INTRAMUSCULAR | Status: AC
Start: 2014-12-04 — End: 2014-12-04
  Administered 2014-12-04: 4 mg via INTRAVENOUS
  Filled 2014-12-04: qty 1

## 2014-12-04 MED ORDER — ONDANSETRON HCL 4 MG/2ML IJ SOLN: 4.0000 mg | Freq: Three times a day (TID) | INTRAMUSCULAR | Status: DC | PRN

## 2014-12-04 MED ORDER — MORPHINE SULFATE 2 MG/ML IJ SOLN
1.0000 mg | INTRAMUSCULAR | Status: DC | PRN
  Administered 2014-12-05 (×2): 1 mg via INTRAVENOUS
  Filled 2014-12-04 (×2): qty 1

## 2014-12-04 MED ORDER — VITAMIN B-1 100 MG PO TABS: 100.0000 mg | ORAL_TABLET | Freq: Every day | ORAL | Status: DC

## 2014-12-04 MED ORDER — ESCITALOPRAM OXALATE 10 MG PO TABS
10.0000 mg | ORAL_TABLET | Freq: Every day | ORAL | Status: DC
  Administered 2014-12-05 – 2014-12-06 (×2): 10 mg via ORAL
  Filled 2014-12-04 (×2): qty 1

## 2014-12-04 MED ORDER — ACETAMINOPHEN 325 MG PO TABS: 650.0000 mg | ORAL_TABLET | Freq: Four times a day (QID) | ORAL | Status: DC | PRN

## 2014-12-04 MED ORDER — LORAZEPAM 2 MG/ML IJ SOLN: 0.0000 mg | Freq: Two times a day (BID) | INTRAMUSCULAR | Status: DC

## 2014-12-04 MED ORDER — NICOTINE 14 MG/24HR TD PT24
14.0000 mg | MEDICATED_PATCH | Freq: Every day | TRANSDERMAL | Status: DC
  Administered 2014-12-05 – 2014-12-06 (×3): 14 mg via TRANSDERMAL
  Filled 2014-12-04 (×3): qty 1

## 2014-12-04 MED ORDER — VITAMIN B-1 100 MG PO TABS
100.0000 mg | ORAL_TABLET | Freq: Every day | ORAL | Status: DC
  Administered 2014-12-05 – 2014-12-06 (×2): 100 mg via ORAL
  Filled 2014-12-04 (×2): qty 1

## 2014-12-04 MED ORDER — DIAZEPAM 5 MG PO TABS
5.0000 mg | ORAL_TABLET | Freq: Three times a day (TID) | ORAL | Status: DC
  Administered 2014-12-05 – 2014-12-06 (×6): 5 mg via ORAL
  Filled 2014-12-04 (×6): qty 1

## 2014-12-04 MED ORDER — ONDANSETRON HCL 4 MG PO TABS: 4.0000 mg | ORAL_TABLET | Freq: Four times a day (QID) | ORAL | Status: DC

## 2014-12-04 MED ORDER — ADULT MULTIVITAMIN W/MINERALS CH
1.0000 | ORAL_TABLET | Freq: Every day | ORAL | Status: DC
  Administered 2014-12-05 – 2014-12-06 (×2): 1 via ORAL
  Filled 2014-12-04 (×2): qty 1

## 2014-12-04 MED ORDER — METHYLPREDNISOLONE SODIUM SUCC 40 MG IJ SOLR
40.0000 mg | Freq: Every day | INTRAMUSCULAR | Status: DC
  Filled 2014-12-04: qty 1

## 2014-12-04 MED ORDER — LORAZEPAM 2 MG/ML IJ SOLN: 1.0000 mg | Freq: Four times a day (QID) | INTRAMUSCULAR | Status: DC | PRN

## 2014-12-04 MED ORDER — GUAIFENESIN ER 600 MG PO TB12
600.0000 mg | ORAL_TABLET | Freq: Two times a day (BID) | ORAL | Status: DC
  Administered 2014-12-05 – 2014-12-06 (×4): 600 mg via ORAL
  Filled 2014-12-04 (×5): qty 1

## 2014-12-04 MED ORDER — ONDANSETRON HCL 4 MG PO TABS: 4.0000 mg | ORAL_TABLET | Freq: Four times a day (QID) | ORAL | Status: DC | PRN

## 2014-12-04 MED ORDER — SUCRALFATE 1 GM/10ML PO SUSP: 1.0000 g | Freq: Three times a day (TID) | ORAL | Status: DC

## 2014-12-04 MED ORDER — ONDANSETRON HCL 4 MG/2ML IJ SOLN
4.0000 mg | Freq: Once | INTRAMUSCULAR | Status: AC
  Administered 2014-12-04: 4 mg via INTRAVENOUS
  Filled 2014-12-04: qty 2

## 2014-12-04 MED ORDER — SODIUM CHLORIDE 0.9 % IV BOLUS (SEPSIS)
500.0000 mL | Freq: Once | INTRAVENOUS | Status: AC
  Administered 2014-12-04: 500 mL via INTRAVENOUS

## 2014-12-04 MED ORDER — DOCUSATE SODIUM 100 MG PO CAPS
100.0000 mg | ORAL_CAPSULE | Freq: Two times a day (BID) | ORAL | Status: DC
  Administered 2014-12-05 – 2014-12-06 (×4): 100 mg via ORAL
  Filled 2014-12-04 (×5): qty 1

## 2014-12-04 MED ORDER — ALBUTEROL (5 MG/ML) CONTINUOUS INHALATION SOLN
15.0000 mg/h | INHALATION_SOLUTION | Freq: Once | RESPIRATORY_TRACT | Status: AC
  Administered 2014-12-04: 15 mg/h via RESPIRATORY_TRACT
  Filled 2014-12-04: qty 20

## 2014-12-04 MED ORDER — ATORVASTATIN CALCIUM 20 MG PO TABS
20.0000 mg | ORAL_TABLET | Freq: Every evening | ORAL | Status: DC
  Administered 2014-12-05 (×2): 20 mg via ORAL
  Filled 2014-12-04 (×3): qty 1

## 2014-12-04 MED ORDER — LORAZEPAM 1 MG PO TABS
1.0000 mg | ORAL_TABLET | Freq: Four times a day (QID) | ORAL | Status: DC | PRN
  Administered 2014-12-06: 1 mg via ORAL
  Filled 2014-12-04: qty 1

## 2014-12-04 MED ORDER — METHYLPREDNISOLONE SODIUM SUCC 125 MG IJ SOLR
125.0000 mg | Freq: Once | INTRAMUSCULAR | Status: AC
  Administered 2014-12-04: 125 mg via INTRAVENOUS
  Filled 2014-12-04: qty 2

## 2014-12-04 MED ORDER — ENSURE COMPLETE PO LIQD
237.0000 mL | Freq: Two times a day (BID) | ORAL | Status: DC
  Administered 2014-12-05 (×2): 237 mL via ORAL

## 2014-12-04 NOTE — ED Notes (Signed)
Pt drinking PO contrast.  Pt's family member is at bedside.  Pt reports calling her for a ride home thinking he is discharged.  Family is concerned about pt, she reports that pt comes to the ED at least once or twice every week.  States "there's gotta be something wrong with him if he keeps coming to the ED this much.  I'm having to ppick him up all the time."  Explained to her that we collected blood specimens and CXR and is now prepping for CT to see what is causing his abd pain.  And if all his recults are WNL the EDP will refer him to f/u with his PCP.  She also reports that pt is supposed to be on home O2 but he does not have one at home.  Pt reports he has nebulizer.  Lauren EDPA made aware re pt's hypoxia.

## 2014-12-04 NOTE — ED Notes (Signed)
Bed: TV15 Expected date:  Expected time:  Means of arrival:  Comments: EMS Abdominal Pain ETA 70min

## 2014-12-04 NOTE — ED Provider Notes (Signed)
CSN: [id number]     Arrival date & time 12/04/14  1443 History   First MD Initiated Contact with Patient 12/04/14 1505     Chief Complaint  Patient presents with  . Abdominal Pain  . Alcohol Intoxication     (Consider location/radiation/quality/duration/timing/severity/associated sxs/prior Treatment) HPI Comments: The patient is a 59 year old male with past mental history of COPD, tobacco abuse, EtOH, hard of hearing, hypertension, diverticulitis status post colon resection and colostomy, presenting to the emergency department with multiple complaints. He reports abdominal pain for several weeks, generalized, more epigastrium and left-sided over the last several days. The patient reports it is "socializing" to the left flank. He reports persistent nausea for 3 years with emesis today. The patient denies dysuria or hematuria. He reports "when I have to go to have to go"ongoing for several months. He also complains of blacking out for several weeks. He reports upon standing he gets lightheaded he reports syncope lasting seconds. He reports near-syncope resolved with seated position. The patient also complains of mild left-sided and central chest discomfort since today he reports similar symptoms with reflux. He denies shortness of breath, palpitations or lower extremity edema. He reports drinking one 12 ounce beer today.  PCP: [name], NP   Patient is a 59 y.o. male presenting with abdominal pain and intoxication. The history is provided by the patient. No language interpreter was used.  Abdominal Pain Associated symptoms: chest pain, nausea and vomiting   Associated symptoms: no chills, no constipation, no cough, no fever and no shortness of breath   Alcohol Intoxication Associated symptoms include abdominal pain, chest pain, nausea and vomiting. Pertinent negatives include no chills, coughing or fever.    Past Medical History  Diagnosis Date  . COPD (chronic obstructive  pulmonary disease)   . History of pneumonia   . Hypertension     a. Normotensive 08/2013 off meds.  [name] HOH (hard of hearing)   . Protein calorie malnutrition   . ETOH abuse   . Tobacco abuse   . Diverticulitis     a. s/p colon resection and colostomy  . Cardiac arrest     a. 01/2013, felt to be 2/2 severe COPD req trach;  b. 01/2013 Echo: EF 65-70% w/o rwma.  . Difficult intubation   . CAD (coronary artery disease)     a. Nonobstructive CAD by cath 08/2013 (mod RCA, mid LAD myocardial bridge) - ECG was concerning for inf STEMI but pt ruled out. CP possibly GERD.   [name] Hyponatremia     a. During 08/2013 felt due to polydipsia.  . Polysubstance abuse     a. Tobacco, marijuana, alcoholism.  [name] Anxiety   . Shortness of breath   . Peripheral vascular disease    Past Surgical History  Procedure Laterality Date  . Hernia repair      inguinal  . Colostomy  12/11/2011    Procedure: COLOSTOMY;  Surgeon: Rolm Bookbinder, MD;  Location: [facility];  Service: General;  Laterality: N/A;  . Colostomy revision  12/11/2011    Procedure: COLON RESECTION SIGMOID;  Surgeon: Rolm Bookbinder, MD;  Location: [facility];  Service: General;;  . Bowel resection  12/11/2011    Procedure: SMALL BOWEL RESECTION;  Surgeon: Rolm Bookbinder, MD;  Location: [facility];  Service: General;;  times 2  . Cystoscopy w/ ureteral stent placement  12/11/2011    Procedure: CYSTOSCOPY WITH STENT REPLACEMENT;  Surgeon: Malka So, MD;  Location: [facility];  Service: Urology;  Laterality:  Left;  ureteral catheter placement  . Cardiac catheterization  08/25/2013  . Tracheostomy  01/2013    emergent d/t difficult intubation  . Colon surgery    . Tracheostomy closure    . Left heart catheterization with coronary angiogram N/A 08/25/2013    Procedure: LEFT HEART CATHETERIZATION WITH CORONARY ANGIOGRAM;  Surgeon: Wellington Hampshire, MD;  Location: [facility];  Service: Cardiovascular;  Laterality: N/A;   Family History  Problem Relation Age  of Onset  . Heart attack Father   . Cancer Father     unknown type  . HIV Brother    History  Substance Use Topics  . Smoking status: Current Every Day Smoker -- 0.50 packs/day for 44 years    Types: Cigarettes    Last Attempt to Quit: 11/03/2011  . Smokeless tobacco: Never Used     Comment: Has smoked as much as 2-3 packs/day.  Now can make a pack last a week.  . Alcohol Use: Yes     Comment: Previously drank heavily.  Admits to drinking 1-3 40 oz beers most days of the week.    Review of Systems  Constitutional: Negative for fever and chills.  Respiratory: Negative for cough, chest tightness, shortness of breath and wheezing.   Cardiovascular: Positive for chest pain. Negative for leg swelling.  Gastrointestinal: Positive for nausea, vomiting and abdominal pain. Negative for constipation.  Genitourinary: Negative for frequency.  Neurological: Positive for syncope and light-headedness.      Allergies  Bee venom; Shellfish allergy; and Codeine  Home Medications   Prior to Admission medications   Medication Sig Start Date End Date Taking? Authorizing Provider  albuterol (PROVENTIL) (2.5 MG/3ML) 0.083% nebulizer solution Take 3 mLs (2.5 mg total) by nebulization every 4 (four) hours as needed for wheezing or shortness of breath. 11/22/14   Ephraim Hamburger, MD  albuterol-ipratropium (COMBIVENT) 18-103 MCG/ACT inhaler Inhale 2 puffs into the lungs every 4 (four) hours as needed for wheezing or shortness of breath. 08/09/14   Noland Fordyce, PA-C  atorvastatin (LIPITOR) 20 MG tablet Take 1 tablet (20 mg total) by mouth every evening. 08/09/14   Noland Fordyce, PA-C  azithromycin (ZITHROMAX) 250 MG tablet Take 1 tablet (250 mg total) by mouth daily. 11/23/14   Kalman Drape, MD  clotrimazole (LOTRIMIN) 1 % cream Apply to affected area 2 times daily Patient not taking: Reported on 11/22/2014 08/10/13   Elmyra Ricks Pisciotta, PA-C  diazepam (VALIUM) 5 MG tablet Take 1 tablet (5 mg total) by mouth  3 (three) times daily. 02/08/14   Tiffany L Reed, DO  docusate sodium 100 MG CAPS Take 100 mg by mouth 2 (two) times daily. 02/06/14   Megan N Dort, PA-C  doxycycline (VIBRAMYCIN) 100 MG capsule Take 1 capsule (100 mg total) by mouth 2 (two) times daily. One po bid x 7 days Patient not taking: Reported on 11/22/2014 08/09/14   Noland Fordyce, PA-C  escitalopram (LEXAPRO) 10 MG tablet Take 1 tablet (10 mg total) by mouth daily. 11/26/13   Annita Brod, MD  folic acid (FOLVITE) 1 MG tablet Take 1 tablet (1 mg total) by mouth daily. 08/27/13   Dayna N Dunn, PA-C  LORazepam (ATIVAN) 0.5 MG tablet Take one to two tablets by mouth every 8 hours as needed for anxiety 03/25/14   Nehemiah Settle A Upstill, PA-C  losartan (COZAAR) 50 MG tablet Take 1 tablet (50 mg total) by mouth daily. 08/09/14   Noland Fordyce, PA-C  metoprolol tartrate (LOPRESSOR) 12.5  mg TABS tablet Take 0.5 tablets (12.5 mg total) by mouth 2 (two) times daily. Patient not taking: Reported on 11/22/2014 08/09/14   Noland Fordyce, PA-C  metoprolol tartrate (LOPRESSOR) 25 MG tablet Take 25 mg by mouth 2 (two) times daily.    Historical Provider, MD  nitroGLYCERIN (NITROSTAT) 0.4 MG SL tablet Place 1 tablet (0.4 mg total) under the tongue every 5 (five) minutes as needed for chest pain (up to 3 doses). 08/09/14   Noland Fordyce, PA-C  omeprazole (PRILOSEC) 20 MG capsule Take 1 capsule (20 mg total) by mouth daily. 08/09/14   Noland Fordyce, PA-C  predniSONE (DELTASONE) 20 MG tablet Take 3 tablets (60 mg total) by mouth daily. 11/23/14   Kalman Drape, MD  QUEtiapine (SEROQUEL) 50 MG tablet Take 1 tablet (50 mg total) by mouth every morning. 06/28/14   Antonietta Breach, PA-C  risperiDONE (RISPERDAL) 0.5 MG tablet Take 1 tablet (0.5 mg total) by mouth 2 (two) times daily. 11/26/13   Annita Brod, MD  thiamine 100 MG tablet Take 1 tablet (100 mg total) by mouth daily. 01/20/14   Thurnell Lose, MD  tiotropium (SPIRIVA) 18 MCG inhalation capsule Place 1 capsule (18 mcg  total) into inhaler and inhale daily. 06/28/14   Antonietta Breach, PA-C  traMADol (ULTRAM) 50 MG tablet Take one tablet by mouth every 6 hours as needed for pain; Take one and 1/2 tablet by mouth every 6 hours as needed for moderate pain; Take two tablets by mouth every 6 hours as needed for severe pain Patient not taking: Reported on 11/22/2014 03/25/14   Nehemiah Settle A Upstill, PA-C   BP 113/66 mmHg  Pulse 76  Temp(Src) 98 F (36.7 C)  Resp 20  SpO2 99% Physical Exam  Constitutional: He is oriented to person, place, and time. He appears well-developed.  Cachectic male  HENT:  Head: Normocephalic and atraumatic.  Poor oral hygiene   Eyes: No scleral icterus.  Neck: Neck supple.  Cardiovascular: Normal rate and regular rhythm.   No lower extremity edema  Pulmonary/Chest: Effort normal. No accessory muscle usage. No respiratory distress. He has decreased breath sounds. He has no wheezes. He has no rhonchi. He has no rales.  Mild decrease in lung sounds in all fields. Patient is able to speak in complete sentences.    Abdominal: Soft. There is tenderness in the epigastric area and left upper quadrant.  Left sided colostomy with pink-red bowel, no surrounding erythema.  Large amount of brown stool in colostomy bag.  Musculoskeletal: Normal range of motion.  Neurological: He is alert and oriented to person, place, and time.  Skin: Skin is warm. He is not diaphoretic.  Psychiatric: He has a normal mood and affect. His behavior is normal.  Nursing note and vitals reviewed.   ED Course  Procedures (including critical care time) Labs Review Labs Reviewed  CBC - Abnormal; Notable for the following:    WBC 11.4 (*)    All other components within normal limits  COMPREHENSIVE METABOLIC PANEL - Abnormal; Notable for the following:    CO2 33 (*)    Glucose, Bld 102 (*)    All other components within normal limits  URINALYSIS, ROUTINE W REFLEX MICROSCOPIC - Abnormal; Notable for the following:    Protein,  ur 30 (*)    All other components within normal limits  LIPASE, BLOOD  URINE MICROSCOPIC-ADD ON  Randolm Idol, ED    Imaging Review Dg Chest 2 View  12/04/2014   CLINICAL  DATA:  Alcohol intoxication, abdominal pain.  EXAM: CHEST  2 VIEW  COMPARISON:  11/22/2014  FINDINGS: Multiple monitoring leads overlie the patient. Stable cardiac and mediastinal contours. Lungs are hyperinflated. No large consolidative pulmonary opacities. No pleural effusion or pneumothorax. Old posttraumatic deformity of the right upper ribs and clavicle.  IMPRESSION: Pulmonary hyperinflation.  No acute cardiopulmonary process.   Electronically Signed   By: Lovey Newcomer M.D.   On: 12/04/2014 16:39   Ct Abdomen Pelvis W Contrast  12/04/2014   CLINICAL DATA:  59 year old male with generalized abdominal pain nausea vomiting. Current history of colostomy. Initial encounter.  EXAM: CT ABDOMEN AND PELVIS WITH CONTRAST  TECHNIQUE: Multidetector CT imaging of the abdomen and pelvis was performed using the standard protocol following bolus administration of intravenous contrast.  CONTRAST:  175mL OMNIPAQUE IOHEXOL 300 MG/ML  SOLN  COMPARISON:  CT Abdomen and Pelvis 01/29/2014 and earlier  FINDINGS: negative lung bases. No pericardial or pleural effusion. No acute osseous abnormality identified.  No pelvic free fluid. Blind ending distal colon with some retained stool. Oral contrast distending the stomach and throughout numerous small bowel loops. Patulous bowel loop in the lower abdomen at the site of an anastomosis re- identified. The right and transverse colon are present. 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 No evidence of bowel obstruction.  Diminutive bladder. Liver, gallbladder, spleen, pancreas, adrenal glands, portal venous system, and kidneys are within normal limits. Aortoiliac calcified atherosclerosis noted. Major arterial structures are patent. No abdominal free fluid. No  lymphadenopathy identified.  IMPRESSION: No bowel obstruction, acute or inflammatory finding identified in the abdomen or pelvis. Stable descending colostomy.   Electronically Signed   By: Genevie Ann M.D.   On: 12/04/2014 19:48     Date: 12/04/2014  Rate: 69  Rhythm: normal sinus rhythm and premature ventricular contractions (PVC)  QRS Axis: normal  Intervals: normal  ST/T Wave abnormalities: nonspecific ST changes  Conduction Disutrbances:none  Narrative Interpretation:   Old EKG Reviewed:       MDM   Final diagnoses:  Nausea  Hypoxia  Abdominal pain in male  History of colostomy   Patient presents with multiple complaints abdominal pain, chest pain, and syncope after standing.  Abdomen with epigastric and left upper discomfort. Ostomy site appears without infection, ostomy bag full. Labs ordered, pain medication given, nausea medication given. 1730  Re-eval pt reports full resolution of symptoms, states he feels better now than he has in 4 weeks. Repeat abdominal exam shows persistent epigastric and right upper quadrant discomfort with palpation. Discussed with Dr. Darl Householder, advises CT abdomen. CT negative, plan to discharge on nausea medication and carafate.  Discomfort likely due to reflux and EtOh use.  Follow up with GI. Upon  Reevaluation patient SPO2 87% with pulse ox on her right hand, however when changed to left hand SPO2 97%, patient maintain a SPO2 greater than 92% on room air for 3 minutes. The patient was constantly talking during this. The heart rate was in the low 80s. Patient denied shortness of breath or chest pain.  Upon reentering the room patient's SPO2 88% on room air. Plan to admit to hospitalist for hypoxia. Discussed patient history with Dr. Blaine Hamper, agrees to admit.  Harvie Heck, PA-C 12/04/14 2357  Wandra Arthurs, MD 12/05/14 [phone]

## 2014-12-04 NOTE — H&P (Addendum)
[facility] History and Physical  Zachary Walls TMH:[id number] DOB: Feb 09, 1956 DOA: 12/04/2014  Referring physician: ED physician PCP: [name], NP  Specialists:   Chief Complaint: Multiple complaints, including nausea, vomiting, abdominal pain, chest discomfort, worsening cough and shortness of breath.  HPI: Zachary Walls is a 59 y.o. male with past medical history of COPD, hypertension, hyperlipidemia, GERD, tobacco abuse, alcohol abuse, coronary artery disease, peripheral vascular disease, history of cardiac arrest, small bowel resection, current colostomy, who presents with multiple complaints, including nausea, vomiting, abdominal pain, chest discomfort, worsening cough and shortness of breath.  Patient has vhronic mild abdominal pain which has been worsening in the past 2 or 3 weeks. He also reports having nausea, vomiting and mild diarrhea. Patient does not have fever or chills.  He complains of having chest discomfort, but no palpitation, or typical chest pain. He also reports worsening cough and shortness of breath. He coughs up greenish colored sputum, but sometimes sputum is white per pt. He was found to have oxygen desaturation to 82% in the emergency room, which responded to the nasal cannula oxygen. He reports that he is supposed to use oxygen at home, but he is not doing so because he does not have oxygen at home. He continues to smoke cigarettes and drink alcohol. Patient denies fever, chills, dysuria, urgency, frequency, hematuria, skin rashes or leg swelling. No unilateral weakness, numbness or tingling sensations. No vision change or hearing loss.  In ED, patient was found to have leukocytosis with WBC 11.4, negative urinalysis, negative troponin, lipase 44, chest x-ray negative for acute abnormalities, electrolytes okay, body temperature normal. CT abdomen/pelvis did not show acute abnormalities (has colostomy in place). Patient is admitted to inpatient for further  evaluation and treatment.  Review of Systems: As presented in the history of presenting illness, rest negative.  Where does patient live?  At home Can patient participate in ADLs? little  Allergy:  Allergies  Allergen Reactions  . Bee Venom Shortness Of Breath and Swelling  . Shellfish Allergy Anaphylaxis  . Codeine Itching and Rash    Past Medical History  Diagnosis Date  . COPD (chronic obstructive pulmonary disease)   . History of pneumonia   . Hypertension     a. Normotensive 08/2013 off meds.  [name] HOH (hard of hearing)   . Protein calorie malnutrition   . ETOH abuse   . Tobacco abuse   . Diverticulitis     a. s/p colon resection and colostomy  . Cardiac arrest     a. 01/2013, felt to be 2/2 severe COPD req trach;  b. 01/2013 Echo: EF 65-70% w/o rwma.  . Difficult intubation   . CAD (coronary artery disease)     a. Nonobstructive CAD by cath 08/2013 (mod RCA, mid LAD myocardial bridge) - ECG was concerning for inf STEMI but pt ruled out. CP possibly GERD.   [name] Hyponatremia     a. During 08/2013 felt due to polydipsia.  . Polysubstance abuse     a. Tobacco, marijuana, alcoholism.  [name] Anxiety   . Shortness of breath   . Peripheral vascular disease     Past Surgical History  Procedure Laterality Date  . Hernia repair      inguinal  . Colostomy  12/11/2011    Procedure: COLOSTOMY;  Surgeon: Rolm Bookbinder, MD;  Location: [facility];  Service: General;  Laterality: N/A;  . Colostomy revision  12/11/2011    Procedure: COLON RESECTION SIGMOID;  Surgeon: Rolm Bookbinder, MD;  Location: [facility];  Service: General;;  . Bowel resection  12/11/2011    Procedure: SMALL BOWEL RESECTION;  Surgeon: Rolm Bookbinder, MD;  Location: [facility];  Service: General;;  times 2  . Cystoscopy w/ ureteral stent placement  12/11/2011    Procedure: CYSTOSCOPY WITH STENT REPLACEMENT;  Surgeon: Malka So, MD;  Location: [facility];  Service: Urology;  Laterality: Left;  ureteral catheter placement  .  Cardiac catheterization  08/25/2013  . Tracheostomy  01/2013    emergent d/t difficult intubation  . Colon surgery    . Tracheostomy closure    . Left heart catheterization with coronary angiogram N/A 08/25/2013    Procedure: LEFT HEART CATHETERIZATION WITH CORONARY ANGIOGRAM;  Surgeon: Wellington Hampshire, MD;  Location: [facility];  Service: Cardiovascular;  Laterality: N/A;    Social History:  reports that he has been smoking Cigarettes.  He has a 22 pack-year smoking history. He has never used smokeless tobacco. He reports that he drinks alcohol. He reports that he uses illicit drugs (Marijuana).  Family History:  Family History  Problem Relation Age of Onset  . Heart attack Father   . Cancer Father     unknown type  . HIV Brother      Prior to Admission medications   Medication Sig Start Date End Date Taking? Authorizing Provider  albuterol (PROVENTIL) (2.5 MG/3ML) 0.083% nebulizer solution Take 3 mLs (2.5 mg total) by nebulization every 4 (four) hours as needed for wheezing or shortness of breath. 11/22/14  Yes Ephraim Hamburger, MD  atorvastatin (LIPITOR) 20 MG tablet Take 1 tablet (20 mg total) by mouth every evening. 08/09/14  Yes Noland Fordyce, PA-C  omeprazole (PRILOSEC) 20 MG capsule Take 1 capsule (20 mg total) by mouth daily. 08/09/14  Yes Noland Fordyce, PA-C  QUEtiapine (SEROQUEL) 50 MG tablet Take 1 tablet (50 mg total) by mouth every morning. 06/28/14  Yes Antonietta Breach, PA-C  risperiDONE (RISPERDAL) 0.5 MG tablet Take 1 tablet (0.5 mg total) by mouth 2 (two) times daily. 11/26/13  Yes Annita Brod, MD  tiotropium (SPIRIVA) 18 MCG inhalation capsule Place 1 capsule (18 mcg total) into inhaler and inhale daily. 06/28/14  Yes Antonietta Breach, PA-C  albuterol-ipratropium (COMBIVENT) 18-103 MCG/ACT inhaler Inhale 2 puffs into the lungs every 4 (four) hours as needed for wheezing or shortness of breath. 08/09/14   Noland Fordyce, PA-C  diazepam (VALIUM) 5 MG tablet Take 1 tablet (5 mg  total) by mouth 3 (three) times daily. 02/08/14   Tiffany L Reed, DO  docusate sodium 100 MG CAPS Take 100 mg by mouth 2 (two) times daily. 02/06/14   Megan N Dort, PA-C  escitalopram (LEXAPRO) 10 MG tablet Take 1 tablet (10 mg total) by mouth daily. 11/26/13   Annita Brod, MD  folic acid (FOLVITE) 1 MG tablet Take 1 tablet (1 mg total) by mouth daily. 08/27/13   Dayna N [facility], PA-C  losartan (COZAAR) 50 MG tablet Take 1 tablet (50 mg total) by mouth daily. 08/09/14   Noland Fordyce, PA-C  nitroGLYCERIN (NITROSTAT) 0.4 MG SL tablet Place 1 tablet (0.4 mg total) under the tongue every 5 (five) minutes as needed for chest pain (up to 3 doses). 08/09/14   Noland Fordyce, PA-C  ondansetron (ZOFRAN) 4 MG tablet Take 1 tablet (4 mg total) by mouth every 6 (six) hours. 12/04/14   Harvie Heck, PA-C  sucralfate (CARAFATE) 1 GM/10ML suspension Take 10 mLs (1 g total) by mouth 4 (four)  times daily -  with meals and at bedtime. 12/04/14   Harvie Heck, PA-C  thiamine 100 MG tablet Take 1 tablet (100 mg total) by mouth daily. 01/20/14   Thurnell Lose, MD    Physical Exam: Filed Vitals:   12/04/14 2106 12/04/14 2115 12/04/14 2130 12/04/14 2207  BP:   113/65 100/72  Pulse:  64  72  Temp:    98 F (36.7 C)  TempSrc:    Oral  Resp:    20  SpO2: 100% 100%  91%   General: Not in acute distress. Dry mucous in the membrane HEENT:       Eyes: PERRL, EOMI, no scleral icterus       ENT: No discharge from the ears and nose, no pharynx injection, no tonsillar enlargement.        Neck: No JVD, no bruit, no mass felt. Cardiac: S1/S2, RRR, No murmurs, No gallops or rubs Pulm: has decreased air movement and mild wheezing, No rale or rubs. Abd: nondistended, diffusely tender, no rebound pain, no organomegaly, BS present Ext: No edema bilaterally. 2+DP/PT pulse bilaterally Musculoskeletal: No joint deformities, erythema, or stiffness, ROM full Skin: No rashes.  Neuro: Alert and oriented X3, cranial nerves II-XII  grossly intact, muscle strength 5/5 in all extremeties, sensation to light touch intact.  Psych: Patient is not psychotic, no suicidal or hemocidal ideation.  Labs on Admission:  Basic Metabolic Panel:  Recent Labs Lab 12/04/14 1557  NA 138  K 4.1  CL 96  CO2 33*  GLUCOSE 102*  BUN 15  CREATININE 0.69  CALCIUM 8.5   Liver Function Tests:  Recent Labs Lab 12/04/14 1557  AST 31  ALT 36  ALKPHOS 52  BILITOT 0.6  PROT 6.7  ALBUMIN 3.8    Recent Labs Lab 12/04/14 1557  LIPASE 44   No results for input(s): AMMONIA in the last 168 hours. CBC:  Recent Labs Lab 12/04/14 1557  WBC 11.4*  HGB 14.9  HCT 45.0  MCV 93.4  PLT 246   Cardiac Enzymes: No results for input(s): CKTOTAL, CKMB, CKMBINDEX, TROPONINI in the last 168 hours.  BNP (last 3 results) No results for input(s): BNP in the last 8760 hours.  ProBNP (last 3 results) No results for input(s): PROBNP in the last 8760 hours.  CBG: No results for input(s): GLUCAP in the last 168 hours.  Radiological Exams on Admission: Dg Chest 2 View  12/04/2014   CLINICAL DATA:  Alcohol intoxication, abdominal pain.  EXAM: CHEST  2 VIEW  COMPARISON:  11/22/2014  FINDINGS: Multiple monitoring leads overlie the patient. Stable cardiac and mediastinal contours. Lungs are hyperinflated. No large consolidative pulmonary opacities. No pleural effusion or pneumothorax. Old posttraumatic deformity of the right upper ribs and clavicle.  IMPRESSION: Pulmonary hyperinflation.  No acute cardiopulmonary process.   Electronically Signed   By: Lovey Newcomer M.D.   On: 12/04/2014 16:39   Ct Abdomen Pelvis W Contrast  12/04/2014   CLINICAL DATA:  59 year old male with generalized abdominal pain nausea vomiting. Current history of colostomy. Initial encounter.  EXAM: CT ABDOMEN AND PELVIS WITH CONTRAST  TECHNIQUE: Multidetector CT imaging of the abdomen and pelvis was performed using the standard protocol following bolus administration of  intravenous contrast.  CONTRAST:  157mL OMNIPAQUE IOHEXOL 300 MG/ML  SOLN  COMPARISON:  CT Abdomen and Pelvis 01/29/2014 and earlier  FINDINGS: negative lung bases. No pericardial or pleural effusion. No acute osseous abnormality identified.  No pelvic free fluid. Blind ending  distal colon with some retained stool. Oral contrast distending the stomach and throughout numerous small bowel loops. Patulous bowel loop in the lower abdomen at the site of an anastomosis re- identified. The right and transverse colon are present. 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 No evidence of bowel obstruction.  Diminutive bladder. Liver, gallbladder, spleen, pancreas, adrenal glands, portal venous system, and kidneys are within normal limits. Aortoiliac calcified atherosclerosis noted. Major arterial structures are patent. No abdominal free fluid. No lymphadenopathy identified.  IMPRESSION: No bowel obstruction, acute or inflammatory finding identified in the abdomen or pelvis. Stable descending colostomy.   Electronically Signed   By: Genevie Ann M.D.   On: 12/04/2014 19:48    EKG: Independently reviewed. RAE and RAD, TWI in aVL  Assessment/Plan Principal Problem:   Abdominal pain Active Problems:   COPD (chronic obstructive pulmonary disease)   Hypertension   Severe protein-calorie malnutrition   Respiratory failure, acute and chronic   Cardiac arrest   Polysubstance (excluding opioids) dependence   CAD (coronary artery disease)   ETOH abuse   HLD (hyperlipidemia)   GERD (gastroesophageal reflux disease)   Tobacco abuse  Nausea, vomiting, diarrhea, abdominal pain: Unclear etiology. CT abdomen/pelvis is not impressive. Lipase negative. Urinalysis is negative. Likely mild viral gastroenteritis. Currently patient is not obviously septic. He is hemodynamically stable. Electrolytes okay. -Admitted to the MedSurg bed -IV fluid: 125 mL per hour normal saline -Check GI  pathogen panel and C. difficile PCR - Zofran for Nausea and morphine for abdominal pain - consult to social work for home living situation evaluation  COPD: Patient has a worsening shortness of breath and productive cough. He has wheezing on auscultation, indicating mildly exacerbated COPD. -Oral azithromycin for 5 days. -Nebulizers: scheduled Duoneb and prn albuterol -Solu-Medrol 40 mg IV daily  -Mucinex for cough  -Blood and sputum culture, respiratory viral panel -Urine legionella and S. pneumococcal antigen -Follow up blood culture x2, sputum culture, respiratory virus panel  CAD: Patient has chest discomfort. EKG has no significant ischemic changes except for T-wave inversion only in aVL. Initial troponin is negative in emergency room. I'm not sure how true that he has chest discomfort since he is not a good historian, and keeps talking his abdominal pain when I interviewed the patient. -Troponin 3 next -repeat EKG am -Continue Lipitor  Hypertension: -Continue Cozaar  GERD: - Protonix  Polysubstance abuse: Including history of tobacco abuse, history of alcohol abuse, history of marijuana abuse -Check a UDS -Nicotine patch -CIWA protocol  Severe protein-calorie Mulnutrition: -Start Ensure   DVT ppx: SQ Heparin      Code Status: Full code Family Communication: None at bed side.  Disposition Plan: Admit to inpatient   Date of Service 12/04/2014    Ivor Costa [facility] Pager [phone]  If 7PM-7AM, please contact night-coverage [email or web address] Password [facility] 12/04/2014, 10:32 PM

## 2014-12-04 NOTE — ED Notes (Signed)
PER EMS- pt picked up from home c/o chronic abd pain and ETOH.  Pt has hx of colostomy. Alert and oriented. HX of substance abuse and mental health.

## 2014-12-05 LAB — COMPREHENSIVE METABOLIC PANEL
ALT: 32 U/L (ref 0–53)
AST: 33 U/L (ref 0–37)
Albumin: 3.9 g/dL (ref 3.5–5.2)
Alkaline Phosphatase: 55 U/L (ref 39–117)
Anion gap: 7 (ref 5–15)
BUN: 13 mg/dL (ref 6–23)
CALCIUM: 8.5 mg/dL (ref 8.4–10.5)
CO2: 32 mmol/L (ref 19–32)
Chloride: 97 mmol/L (ref 96–112)
Creatinine, Ser: 0.93 mg/dL (ref 0.50–1.35)
GFR calc non Af Amer: >90 mL/min (ref >90)
Glucose, Bld: 134 mg/dL — ABNORMAL HIGH (ref 70–99)
Potassium: 4.9 mmol/L (ref 3.5–5.1)
Sodium: 136 mmol/L (ref 135–145)
Total Bilirubin: 0.6 mg/dL (ref 0.3–1.2)
Total Protein: 6.7 g/dL (ref 6.0–8.3)

## 2014-12-05 LAB — STREP PNEUMONIAE URINARY ANTIGEN: Strep Pneumo Urinary Antigen: NEGATIVE

## 2014-12-05 LAB — CBC
HEMATOCRIT: 44.4 % (ref 39.0–52.0)
HEMOGLOBIN: 14.2 g/dL (ref 13.0–17.0)
MCH: 30.5 pg (ref 26.0–34.0)
MCHC: 32 g/dL (ref 30.0–36.0)
MCV: 95.5 fL (ref 78.0–100.0)
Platelets: 212 10*3/uL (ref 150–400)
RBC: 4.65 MIL/uL (ref 4.22–5.81)
RDW: 13.6 % (ref 11.5–15.5)
WBC: 8.3 10*3/uL (ref 4.0–10.5)

## 2014-12-05 LAB — RAPID URINE DRUG SCREEN, HOSP PERFORMED
Amphetamines: NOT DETECTED
BARBITURATES: NOT DETECTED
Benzodiazepines: NOT DETECTED
Cocaine: NOT DETECTED
Opiates: POSITIVE — AB
Tetrahydrocannabinol: POSITIVE — AB

## 2014-12-05 LAB — EXPECTORATED SPUTUM ASSESSMENT W GRAM STAIN, RFLX TO RESP C

## 2014-12-05 LAB — EXPECTORATED SPUTUM ASSESSMENT W REFEX TO RESP CULTURE

## 2014-12-05 LAB — TROPONIN I
Troponin I: <0.03 ng/mL (ref <0.031)
Troponin I: <0.03 ng/mL (ref <0.031)

## 2014-12-05 LAB — GLUCOSE, CAPILLARY: Glucose-Capillary: 114 mg/dL — ABNORMAL HIGH (ref 70–99)

## 2014-12-05 LAB — MAGNESIUM: MAGNESIUM: 1.9 mg/dL (ref 1.5–2.5)

## 2014-12-05 LAB — PROTIME-INR
INR: 1.01 (ref 0.00–1.49)
Prothrombin Time: 13.4 seconds (ref 11.6–15.2)

## 2014-12-05 MED ORDER — METHYLPREDNISOLONE SODIUM SUCC 40 MG IJ SOLR
40.0000 mg | Freq: Two times a day (BID) | INTRAMUSCULAR | Status: DC
  Administered 2014-12-05 (×2): 40 mg via INTRAVENOUS
  Filled 2014-12-05 (×5): qty 1

## 2014-12-05 MED ORDER — IPRATROPIUM-ALBUTEROL 0.5-2.5 (3) MG/3ML IN SOLN
3.0000 mL | Freq: Four times a day (QID) | RESPIRATORY_TRACT | Status: DC
  Administered 2014-12-05: 3 mL via RESPIRATORY_TRACT
  Filled 2014-12-05: qty 3

## 2014-12-05 MED ORDER — IPRATROPIUM-ALBUTEROL 0.5-2.5 (3) MG/3ML IN SOLN
3.0000 mL | Freq: Three times a day (TID) | RESPIRATORY_TRACT | Status: DC
  Administered 2014-12-05 – 2014-12-06 (×4): 3 mL via RESPIRATORY_TRACT
  Filled 2014-12-05 (×4): qty 3

## 2014-12-05 NOTE — Progress Notes (Signed)
Called back patient's daughter Sharyn Lull, left message to her voicemail.

## 2014-12-05 NOTE — Plan of Care (Signed)
Problem: Phase I Progression Outcomes Goal: OOB as tolerated unless otherwise ordered Outcome: Progressing Patient ambulated in the hallway with standby assist.

## 2014-12-05 NOTE — Progress Notes (Signed)
Patient ambulated in the hallway with NT assist, and checked oxygen level. O2 sat on RA was 71%,highest O2 level was 77%

## 2014-12-05 NOTE — Progress Notes (Signed)
[facility] PROGRESS NOTE  Zachary Walls DGU:[id number] DOB: 11-May-1956 DOA: 12/04/2014  PCP: [name], NP  Brief HPI: 59 year old Caucasian male with a past medical history of COPD, hypertension, GERD, presented with multiple complaints including nausea, vomiting, abdominal pain, worsening cough and shortness of breath. He was found to be mildly hypoxic in the emergency department. He was also wheezing. No clear etiology for his abdominal pain was found. He was admitted for further management.  Past medical history:  Past Medical History  Diagnosis Date  . COPD (chronic obstructive pulmonary disease)   . History of pneumonia   . Hypertension     a. Normotensive 08/2013 off meds.  [name] HOH (hard of hearing)   . Protein calorie malnutrition   . ETOH abuse   . Tobacco abuse   . Diverticulitis     a. s/p colon resection and colostomy  . Cardiac arrest     a. 01/2013, felt to be 2/2 severe COPD req trach;  b. 01/2013 Echo: EF 65-70% w/o rwma.  . Difficult intubation   . CAD (coronary artery disease)     a. Nonobstructive CAD by cath 08/2013 (mod RCA, mid LAD myocardial bridge) - ECG was concerning for inf STEMI but pt ruled out. CP possibly GERD.   [name] Hyponatremia     a. During 08/2013 felt due to polydipsia.  . Polysubstance abuse     a. Tobacco, marijuana, alcoholism.  [name] Anxiety   . Shortness of breath   . Peripheral vascular disease     Consultants: None  Procedures: None  Antibiotics: Azithromycin 2/15  Subjective: Patient feels better this morning. His abdominal pain has resolved. He continues to have a cough and feels similar short of breath. He is very hard of hearing.  Objective: Vital Signs  Filed Vitals:   12/04/14 2207 12/04/14 2237 12/04/14 2329 12/05/14 0440  BP: 100/72  125/69 104/68  Pulse: 72  74 70  Temp: 98 F (36.7 C)  98.2 F (36.8 C) 98.1 F (36.7 C)  TempSrc: Oral  Oral Oral  Resp: 20  20 18   Height:   5\' 7"  (1.702 m)     Weight:   54.432 kg (120 lb)   SpO2: 91% 96% 99% 99%    Intake/Output Summary (Last 24 hours) at 12/05/14 1127 Last data filed at 12/05/14 0440  Gross per 24 hour  Intake    450 ml  Output    450 ml  Net      0 ml   Filed Weights   12/04/14 2329  Weight: 54.432 kg (120 lb)     General appearance: alert, cooperative, appears stated age and no distress Resp: Diffuse end expiratory wheezing bilaterally. crackles. No rhonchi. Cardio: regular rate and rhythm, S1, S2 normal, no murmur, click, rub or gallop GI: soft, non-tender; bowel sounds normal; no masses,  no organomegaly and Colostomy bag is noted. Extremities: extremities normal, atraumatic, no cyanosis or edema Neurologic: He is alert and oriented 3. No focal neurological deficits are noted.  Lab Results:  Basic Metabolic Panel:  Recent Labs Lab 12/04/14 1557 12/05/14 0640 12/05/14 0646  NA 138  --  136  K 4.1  --  4.9  CL 96  --  97  CO2 33*  --  32  GLUCOSE 102*  --  134*  BUN 15  --  13  CREATININE 0.69  --  0.93  CALCIUM 8.5  --  8.5  MG  --  1.9  --  Liver Function Tests:  Recent Labs Lab 12/04/14 1557 12/05/14 0646  AST 31 33  ALT 36 32  ALKPHOS 52 55  BILITOT 0.6 0.6  PROT 6.7 6.7  ALBUMIN 3.8 3.9    Recent Labs Lab 12/04/14 1557  LIPASE 44   CBC:  Recent Labs Lab 12/04/14 1557 12/05/14 0646  WBC 11.4* 8.3  HGB 14.9 14.2  HCT 45.0 44.4  MCV 93.4 95.5  PLT 246 212   Cardiac Enzymes:  Recent Labs Lab 12/05/14 0030 12/05/14 0646  TROPONINI <0.03 <0.03   CBG:  Recent Labs Lab 12/05/14 0735  GLUCAP 114*    Recent Results (from the past 240 hour(s))  Culture, sputum-assessment     Status: None   Collection Time: 12/05/14  6:32 AM  Result Value Ref Range Status   Specimen Description SPUTUM  Final   Special Requests NONE  Final   Sputum evaluation   Final    MICROSCOPIC FINDINGS SUGGEST THAT THIS SPECIMEN IS NOT REPRESENTATIVE OF LOWER RESPIRATORY SECRETIONS.  PLEASE RECOLLECT. CALLED TO ALVIAR,D AT [phone] ON [id number] BY HOOKER,B    Report Status 12/05/2014 FINAL  Final      Studies/Results: Dg Chest 2 View  12/04/2014   CLINICAL DATA:  Alcohol intoxication, abdominal pain.  EXAM: CHEST  2 VIEW  COMPARISON:  11/22/2014  FINDINGS: Multiple monitoring leads overlie the patient. Stable cardiac and mediastinal contours. Lungs are hyperinflated. No large consolidative pulmonary opacities. No pleural effusion or pneumothorax. Old posttraumatic deformity of the right upper ribs and clavicle.  IMPRESSION: Pulmonary hyperinflation.  No acute cardiopulmonary process.   Electronically Signed   By: Lovey Newcomer M.D.   On: 12/04/2014 16:39   Ct Abdomen Pelvis W Contrast  12/04/2014   CLINICAL DATA:  59 year old male with generalized abdominal pain nausea vomiting. Current history of colostomy. Initial encounter.  EXAM: CT ABDOMEN AND PELVIS WITH CONTRAST  TECHNIQUE: Multidetector CT imaging of the abdomen and pelvis was performed using the standard protocol following bolus administration of intravenous contrast.  CONTRAST:  190mL OMNIPAQUE IOHEXOL 300 MG/ML  SOLN  COMPARISON:  CT Abdomen and Pelvis 01/29/2014 and earlier  FINDINGS: negative lung bases. No pericardial or pleural effusion. No acute osseous abnormality identified.  No pelvic free fluid. Blind ending distal colon with some retained stool. Oral contrast distending the stomach and throughout numerous small bowel loops. Patulous bowel loop in the lower abdomen at the site of an anastomosis re- identified. The right and transverse colon are present. 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 No evidence of bowel obstruction.  Diminutive bladder. Liver, gallbladder, spleen, pancreas, adrenal glands, portal venous system, and kidneys are within normal limits. Aortoiliac calcified atherosclerosis noted. Major arterial structures are patent. No abdominal free fluid. No  lymphadenopathy identified.  IMPRESSION: No bowel obstruction, acute or inflammatory finding identified in the abdomen or pelvis. Stable descending colostomy.   Electronically Signed   By: Genevie Ann M.D.   On: 12/04/2014 19:48    Medications:  Scheduled: . atorvastatin  20 mg Oral QPM  . azithromycin  250 mg Oral QHS  . diazepam  5 mg Oral TID  . docusate sodium  100 mg Oral BID  . escitalopram  10 mg Oral Daily  . feeding supplement (ENSURE COMPLETE)  237 mL Oral BID BM  . folic acid  1 mg Oral Daily  . guaiFENesin  600 mg Oral BID  . heparin  5,000 Units Subcutaneous 3 times per day  .  ipratropium-albuterol  3 mL Nebulization QID  . LORazepam  0-4 mg Intravenous Q6H   Followed by  . [START ON 12/07/2014] LORazepam  0-4 mg Intravenous Q12H  . losartan  50 mg Oral Daily  . methylPREDNISolone (SOLU-MEDROL) injection  40 mg Intravenous Q12H  . multivitamin with minerals  1 tablet Oral Daily  . nicotine  14 mg Transdermal Daily  . pantoprazole  40 mg Oral Daily  . QUEtiapine  50 mg Oral q morning - 10a  . risperiDONE  0.5 mg Oral BID  . thiamine  100 mg Intravenous Daily  . thiamine  100 mg Oral Daily   Continuous: . sodium chloride 75 mL/hr at 12/05/14 6720   NOB:[id number], LORazepam **OR** LORazepam, morphine injection, nitroGLYCERIN, ondansetron **OR** ondansetron (ZOFRAN) IV  Assessment/Plan:  Principal Problem:   Abdominal pain Active Problems:   COPD (chronic obstructive pulmonary disease)   Hypertension   Severe protein-calorie malnutrition   Respiratory failure, acute and chronic   Cardiac arrest   Polysubstance (excluding opioids) dependence   CAD (coronary artery disease)   ETOH abuse   HLD (hyperlipidemia)   GERD (gastroesophageal reflux disease)   Tobacco abuse    Nausea, vomiting, diarrhea, abdominal pain Symptoms have resolved. He could've had mild gastroenteritis. CT abdomen/pelvis is not impressive. Lipase negative. Urinalysis is negative. He is  tolerating orally. Stool studies are pending. However, he hasn't had any further diarrhea. We will cut back on his IV fluids. Symptomatic treatment for now.   COPD with acute exacerbation He'll continue nebulizer treatments, steroids and antibiotics. No infiltrates noted on chest x-ray. Check room air saturations with ambulation. Respiratory viral panel is pending.  History of CAD He was complaining of chest discomfort at admission. Currently denies. Might have been due to his COPD. He had a nonobstructive CAD by cardiac catheterization in November 2014. Troponins have been negative.  Essential Hypertension Continue Cozaar  GERD: Protonix  Polysubstance abuse Including history of tobacco abuse, history of alcohol abuse, history of marijuana abuse. Continue nicotine patch. Continue CIWA protocol. He drinks a 12 pack every weekend but denies any alcoholic use on weekdays. He denies any liquor use.   Severe protein-calorie Mulnutrition: Ensure   DVT Prophylaxis: Heparin    Code Status: Full code  Family Communication: Discussed with the patient  Disposition Plan: Not ready for discharge today. Involve PT and OT. Check room air sats.    LOS: 1 day   [facility] Pager [phone] 12/05/2014, 11:27 AM  If 7PM-7AM, please contact night-coverage at [email or web address], password [email or web address]

## 2014-12-06 DIAGNOSIS — J449 Chronic obstructive pulmonary disease, unspecified: Secondary | ICD-10-CM

## 2014-12-06 LAB — LEGIONELLA ANTIGEN, URINE

## 2014-12-06 LAB — COMPREHENSIVE METABOLIC PANEL
ALT: 26 U/L (ref 0–53)
AST: 24 U/L (ref 0–37)
Albumin: 3.4 g/dL — ABNORMAL LOW (ref 3.5–5.2)
Alkaline Phosphatase: 54 U/L (ref 39–117)
Anion gap: 5 (ref 5–15)
BUN: 14 mg/dL (ref 6–23)
CO2: 34 mmol/L — ABNORMAL HIGH (ref 19–32)
Calcium: 8.7 mg/dL (ref 8.4–10.5)
Chloride: 99 mmol/L (ref 96–112)
Creatinine, Ser: 0.82 mg/dL (ref 0.50–1.35)
GFR calc Af Amer: >90 mL/min (ref >90)
GLUCOSE: 211 mg/dL — AB (ref 70–99)
POTASSIUM: 4.8 mmol/L (ref 3.5–5.1)
SODIUM: 138 mmol/L (ref 135–145)
Total Bilirubin: 0.6 mg/dL (ref 0.3–1.2)
Total Protein: 6 g/dL (ref 6.0–8.3)

## 2014-12-06 LAB — CBC
HEMATOCRIT: 40.9 % (ref 39.0–52.0)
Hemoglobin: 13 g/dL (ref 13.0–17.0)
MCH: 30.5 pg (ref 26.0–34.0)
MCHC: 31.8 g/dL (ref 30.0–36.0)
MCV: 96 fL (ref 78.0–100.0)
Platelets: 198 10*3/uL (ref 150–400)
RBC: 4.26 MIL/uL (ref 4.22–5.81)
RDW: 13.6 % (ref 11.5–15.5)
WBC: 13.4 10*3/uL — AB (ref 4.0–10.5)

## 2014-12-06 LAB — GLUCOSE, CAPILLARY: Glucose-Capillary: 94 mg/dL (ref 70–99)

## 2014-12-06 LAB — MAGNESIUM: MAGNESIUM: 1.8 mg/dL (ref 1.5–2.5)

## 2014-12-06 MED ORDER — AZITHROMYCIN 250 MG PO TABS: 250.0000 mg | ORAL_TABLET | Freq: Every day | ORAL | Status: DC

## 2014-12-06 MED ORDER — PREDNISONE 20 MG PO TABS: ORAL_TABLET | ORAL | Status: DC

## 2014-12-06 MED ORDER — GUAIFENESIN ER 600 MG PO TB12: 600.0000 mg | ORAL_TABLET | Freq: Two times a day (BID) | ORAL | Status: DC

## 2014-12-06 MED ORDER — PREDNISONE 50 MG PO TABS
60.0000 mg | ORAL_TABLET | Freq: Every day | ORAL | Status: DC
  Administered 2014-12-06: 60 mg via ORAL
  Filled 2014-12-06 (×2): qty 1

## 2014-12-06 MED ORDER — NICOTINE 14 MG/24HR TD PT24: 14.0000 mg | MEDICATED_PATCH | Freq: Every day | TRANSDERMAL | Status: DC

## 2014-12-06 MED ORDER — SUCRALFATE 1 GM/10ML PO SUSP: 1.0000 g | Freq: Three times a day (TID) | ORAL | Status: DC

## 2014-12-06 NOTE — Care Management Note (Signed)
CARE MANAGEMENT NOTE 12/06/2014  Patient:  Zachary Walls, Zachary Walls   Account Number:  [id number]  Date Initiated:  12/06/2014  Documentation initiated by:  Marney Doctor  Subjective/Objective Assessment:   59 yo admitted with Abdominal pain     Action/Plan:   Lives alone.   Anticipated DC Date:  12/06/2014   Anticipated DC Plan:  [place]  CM consult      [facility] Choice  HOME HEALTH   Choice offered to / List presented to:  C-1 Patient   DME arranged  [facility]      DME agency  [place] arranged  HH-1 RN  [place].   Status of service:  Completed, signed off Medicare Important Message given?   (If response is "NO", the following Medicare IM given date fields will be blank) Date Medicare IM given:   Medicare IM given by:   Date Additional Medicare IM given:   Additional Medicare IM given by:    Discharge Disposition:    Per UR Regulation:  Reviewed for med. necessity/level of care/duration of stay  If discussed at [place] of Stay Meetings, dates discussed:    Comments:  12/06/14 Marney Doctor RN,BSN,NCM [phone] CM referral for [facility] needs and home 02.  Pt states he has used [facility] in the past for [facility] services and would like to use them again.  [facility] rep called to give referral. [place] [facility] rep called to give referral about home 02.  This CM explained the importance of not smoking around the home 02 and that it could cause fires.  Pt states that he understands.

## 2014-12-06 NOTE — Evaluation (Signed)
Occupational Therapy One Time Evaluation Patient Details Name: Zachary Walls MRN: [id number] DOB: 10-10-1956 Today's Date: 12/06/2014    History of Present Illness Zachary Walls is a 59 y.o. male with past medical history of COPD, hypertension, hyperlipidemia, GERD, tobacco abuse, alcohol abuse, coronary artery disease, peripheral vascular disease, history of cardiac arrest, small bowel resection, current colostomy, who presents with multiple complaints, including nausea, vomiting, abdominal pain, chest discomfort, worsening cough and shortness of breath.   Clinical Impression   Feel pt is close or at his ADL baseline. He is supposed to d/c today. Discussed energy conservation techniques and pt appears very aware of his limits and need for rest breaks PRN. All education completed and no follow up OT indicated at this time.    Follow Up Recommendations  Supervision - Intermittent    Equipment Recommendations  None recommended by OT    Recommendations for Other Services       Precautions / Restrictions Precautions Precaution Comments: monitor O2 Restrictions Weight Bearing Restrictions: No      Mobility Bed Mobility                 Transfers Overall transfer level: Modified independent               General transfer comment: pushed up on armrests for sit to stand    Balance                                            ADL Overall ADL's : At baseline                                       General ADL Comments: Pt able to mobilize in the room with overall supervision to step in and out of tub, stand at the sink for grooming and perform basic dressing/self care. Pt states he usually squats down in front of the commode to empty his colostomy bag so pt kneeled in front of the bed and steadied himself with the edge of the bed to stand back up to make sure he can continue this at home. Discussed energy conservation and pt is very aware  of the need for rest breaks PRN and sitting when he needs to take break. Discussed idea of a tubseat but pt states he doesnt think he is at the point of needing a shower chair right now. Pt states he feels he can manage his own meals and laundry at d/c also. Pt's O2 95$ on 2L after activity.      Vision     Perception     Praxis      Pertinent Vitals/Pain Pain Assessment: No/denies pain     Hand Dominance Right   Extremity/Trunk Assessment Upper Extremity Assessment Upper Extremity Assessment: Overall WFL for tasks assessed      Cervical / Trunk Assessment Cervical / Trunk Assessment: Normal   Communication Communication Communication: HOH   Cognition Arousal/Alertness: Awake/alert Behavior During Therapy: WFL for tasks assessed/performed Overall Cognitive Status: Within Functional Limits for tasks assessed                     General Comments       Exercises       Shoulder Instructions  Home Living Family/patient expects to be discharged to:: Private residence Living Arrangements: Alone Available Help at Discharge: Family;Friend(s);Available PRN/intermittently Type of Home: Mobile home Home Access: Stairs to enter Entrance Stairs-Number of Steps: 3-4 Entrance Stairs-Rails: Left;Right;Can reach both Home Layout: One level     Bathroom Shower/Tub: Teacher, early years/pre: Standard     Home Equipment: Environmental consultant - 2 wheels   Additional Comments: pt's son assists as needed      Prior Functioning/Environment Level of Independence: Independent        Comments: Has RW but doesn't use it.     OT Diagnosis: Generalized weakness   OT Problem List:     OT Treatment/Interventions:      OT Goals(Current goals can be found in the care plan section) Acute Rehab OT Goals Patient Stated Goal: return to his baseline fully OT Goal Formulation: With patient  OT Frequency:     Barriers to D/C:            Co-evaluation               End of Session Equipment Utilized During Treatment: Oxygen  Activity Tolerance: Patient tolerated treatment well Patient left: in chair;with call bell/phone within reach   Time: 1142-1203 OT Time Calculation (min): 21 min Charges:  OT General Charges $OT Visit: 1 Procedure OT Evaluation $Initial OT Evaluation Tier I: 1 Procedure G-Codes:    Jules Schick  [phone] 12/06/2014, 12:25 PM

## 2014-12-06 NOTE — Discharge Instructions (Signed)
Call for a follow up appointment with a Family or Primary Care Provider.  Call a gastroenterologist for further evaluation of your abdominal pain and persistent nausea. Return if Symptoms worsen.   Take medication as prescribed.    Chronic Obstructive Pulmonary Disease Chronic obstructive pulmonary disease (COPD) is a common lung condition in which airflow from the lungs is limited. COPD is a general term that can be used to describe many different lung problems that limit airflow, including both chronic bronchitis and emphysema. If you have COPD, your lung function will probably never return to normal, but there are measures you can take to improve lung function and make yourself feel better.  CAUSES   Smoking (common).   Exposure to secondhand smoke.   Genetic problems.  Chronic inflammatory lung diseases or recurrent infections. SYMPTOMS   Shortness of breath, especially with physical activity.   Deep, persistent (chronic) cough with a large amount of thick mucus.   Wheezing.   Rapid breaths (tachypnea).   Gray or bluish discoloration (cyanosis) of the skin, especially in fingers, toes, or lips.   Fatigue.   Weight loss.   Frequent infections or episodes when breathing symptoms become much worse (exacerbations).   Chest tightness. DIAGNOSIS  Your health care provider will take a medical history and perform a physical examination to make the initial diagnosis. Additional tests for COPD may include:   Lung (pulmonary) function tests.  Chest X-ray.  CT scan.  Blood tests. TREATMENT  Treatment available to help you feel better when you have COPD includes:   Inhaler and nebulizer medicines. These help manage the symptoms of COPD and make your breathing more comfortable.  Supplemental oxygen. Supplemental oxygen is only helpful if you have a low oxygen level in your blood.   Exercise and physical activity. These are beneficial for nearly all people with  COPD. Some people may also benefit from a pulmonary rehabilitation program. HOME CARE INSTRUCTIONS   Take all medicines (inhaled or pills) as directed by your health care provider.  Avoid over-the-counter medicines or cough syrups that dry up your airway (such as antihistamines) and slow down the elimination of secretions unless instructed otherwise by your health care provider.   If you are a smoker, the most important thing that you can do is stop smoking. Continuing to smoke will cause further lung damage and breathing trouble. Ask your health care provider for help with quitting smoking. He or she can direct you to community resources or hospitals that provide support.  Avoid exposure to irritants such as smoke, chemicals, and fumes that aggravate your breathing.  Use oxygen therapy and pulmonary rehabilitation if directed by your health care provider. If you require home oxygen therapy, ask your health care provider whether you should purchase a pulse oximeter to measure your oxygen level at home.   Avoid contact with individuals who have a contagious illness.  Avoid extreme temperature and humidity changes.  Eat healthy foods. Eating smaller, more frequent meals and resting before meals may help you maintain your strength.  Stay active, but balance activity with periods of rest. Exercise and physical activity will help you maintain your ability to do things you want to do.  Preventing infection and hospitalization is very important when you have COPD. Make sure to receive all the vaccines your health care provider recommends, especially the pneumococcal and influenza vaccines. Ask your health care provider whether you need a pneumonia vaccine.  Learn and use relaxation techniques to manage stress.  Learn  and use controlled breathing techniques as directed by your health care provider. Controlled breathing techniques include:   Pursed lip breathing. Start by breathing in (inhaling)  through your nose for 1 second. Then, purse your lips as if you were going to whistle and breathe out (exhale) through the pursed lips for 2 seconds.   Diaphragmatic breathing. Start by putting one hand on your abdomen just above your waist. Inhale slowly through your nose. The hand on your abdomen should move out. Then purse your lips and exhale slowly. You should be able to feel the hand on your abdomen moving in as you exhale.   Learn and use controlled coughing to clear mucus from your lungs. Controlled coughing is a series of short, progressive coughs. The steps of controlled coughing are:  1. Lean your head slightly forward.  2. Breathe in deeply using diaphragmatic breathing.  3. Try to hold your breath for 3 seconds.  4. Keep your mouth slightly open while coughing twice.  5. Spit any mucus out into a tissue.  6. Rest and repeat the steps once or twice as needed. SEEK MEDICAL CARE IF:   You are coughing up more mucus than usual.   There is a change in the color or thickness of your mucus.   Your breathing is more labored than usual.   Your breathing is faster than usual.  SEEK IMMEDIATE MEDICAL CARE IF:   You have shortness of breath while you are resting.   You have shortness of breath that prevents you from:  Being able to talk.   Performing your usual physical activities.   You have chest pain lasting longer than 5 minutes.   Your skin color is more cyanotic than usual.  You measure low oxygen saturations for longer than 5 minutes with a pulse oximeter. MAKE SURE YOU:   Understand these instructions.  Will watch your condition.  Will get help right away if you are not doing well or get worse. Document Released: 07/18/2005 Document Revised: 02/22/2014 Document Reviewed: 06/04/2013 [facility] Patient Information 2015 [place], [place]. This information is not intended to replace advice given to you by your health care provider. Make sure you discuss any  questions you have with your health care provider.

## 2014-12-06 NOTE — Progress Notes (Signed)
Clinical Social Work Department BRIEF PSYCHOSOCIAL ASSESSMENT 12/06/2014  Patient:  Zachary Walls, Zachary Walls     Account Number:  [id number]     Admit date:  12/04/2014  Clinical Social Worker:  Maryln Manuel  Date/Time:  12/06/2014 03:30 PM  Referred by:  Physician  Date Referred:  12/06/2014 Referred for  Other - See comment  Substance Abuse   Other Referral:   referral placed to evaluate pt home environment   Interview type:  Patient Other interview type:    PSYCHOSOCIAL DATA Living Status:  ALONE Admitted from facility:   Level of care:   Primary support name:  Zachary Walls/son/[phone] Primary support relationship to patient:  CHILD, ADULT Degree of support available:   adequate    CURRENT CONCERNS Current Concerns  Other - See comment   Other Concerns:   current substance abuse; home enviornment    SOCIAL WORK ASSESSMENT / PLAN CSW received referral for current substance abuse and home living situation evaluation.    Per chart, pt discharging today.    CSW met with pt at bedside. CSW introduced self and explained role. Pt states that he lives alone in a trailer. Pt shared that he has limited support, but reports that he has support from his son and daughter-in-law. CSW inquired with pt about his mobility at home and pt states that he is able to navigate his home independently. Pt shared that he is now going to require home oxygen and he expressed that navigating with the home oxygen will "take getting used to." Pt shared the he has been very independent his whole life and he continues to strive to maintain his independence.    CSW discussed with pt regarding his use of alcohol and Marijuana. Pt states that he does not feel that he has a "problem". Pt shared the he has reduced his drinking and smoking of cigarettes and Marijuana significantly, but is trying to continue to reduce his usage in order to quit. Pt shared the he has a nicotine patch to assist him.  CSW reiterated the  importance of not smoking around the home O2 as it can cause fires.    CSW discussed with pt that MD has ordered a [place] to come to pt home along with [place]. Pt agreeable. Pt expressed that he has been having issues with his cell phone, but home health agency can contact pt son if they are unable to reach pt. RNCM aware of this.    No further social work needs identified at this time.    CSW signing off.   Assessment/plan status:  No Further Intervention Required Other assessment/ plan:   Information/referral to community resources:   Order has been placed for [facility] SW to go to home to assess pt living environment.    PATIENT'S/FAMILY'S RESPONSE TO PLAN OF CARE: Pt alert and oriented x 4. Pt hard of hearing, but engaged in assessment. Pt is eager to maintain his independence at home even though he now requires home oxygen. Pt open to discussing that he uses alcohol and Marijuana, but states the he does not feel that he is abusing the substances and reports that he has decreased his use of substances significantly. Pt feels that he is managing at home well given multiple comorbidities. Pt eager to return home and agreeable to home health SW for home safety eval.   Zachary Walls, MSW, [place] [facility] [phone]

## 2014-12-06 NOTE — Discharge Summary (Signed)
[facility]  Physician Discharge Summary   Patient ID: Zachary Walls MRN: [id number] DOB/AGE: [birth year] 59 y.o.  Admit date: 12/04/2014 Discharge date: 12/06/2014  PCP: [name], NP  DISCHARGE DIAGNOSES:  Principal Problem:   Abdominal pain Active Problems:   COPD (chronic obstructive pulmonary disease)   Hypertension   Severe protein-calorie malnutrition   Respiratory failure, acute and chronic   Cardiac arrest   Polysubstance (excluding opioids) dependence   CAD (coronary artery disease)   ETOH abuse   HLD (hyperlipidemia)   GERD (gastroesophageal reflux disease)   Tobacco abuse   RECOMMENDATIONS FOR OUTPATIENT FOLLOW UP: 1. Respiratory viral panel is pending 2. Home oxygen will be arranged.  DISCHARGE CONDITION: fair  Diet recommendation: heart healthy  Filed Weights   12/04/14 2329  Weight: 54.432 kg (120 lb)    INITIAL HISTORY: 59 year old Caucasian male with a past medical history of COPD, hypertension, GERD, presented with multiple complaints including nausea, vomiting, abdominal pain, worsening cough and shortness of breath. He was found to be mildly hypoxic in the emergency department. He was also wheezing. No clear etiology for his abdominal pain was found. He was admitted for further management.  Consultations:  none  Procedures:  none  HOSPITAL COURSE:   Nausea, vomiting, diarrhea, abdominal pain Symptoms have resolved. He could've had mild gastroenteritis. CT abdomen/pelvis was not impressive. Lipase negative. Urinalysis is negative. He is tolerating orally. Stool studies are pending. However, he hasn't had any further diarrhea. He complains of some acid reflux. He is already on a PPI. He'll be prescribed Carafate. He can follow with gastroenterology as needed for pain.  COPD with acute exacerbation Improved with nebulizer treatments, steroids and antibiotics. He'll be prescribed steroid taper and antibiotics. No infiltrates  noted on chest x-ray. He appears to be hypoxic with the exertion. He will require home oxygen, which will be arranged by case management. Respiratory viral panel is pending.  History of CAD He was complaining of chest discomfort at admission. But denied chest pain later on. Might have been due to his COPD. He had a nonobstructive CAD by cardiac catheterization in November 2014. Troponins have been negative.  Essential Hypertension Continue home medications  GERD: Protonix  Polysubstance abuse Including history of tobacco abuse, history of alcohol abuse, history of marijuana abuse. Continue nicotine patch. He was placed on CIWA protocol but did not go into alcohol withdrawals. He drinks a 12 pack of beer every weekend but denies any alcoholic use on weekdays. He denies any liquor use. He was counseled to stop drinking and smoking.  Severe protein-calorie Mulnutrition: He was given Ensure in the hospital.  Patient is improved this morning. Home oxygen will be arranged for him. He needs to follow-up with his doctor. I tried calling his daughter, but there was no response. Left message. He is stable for discharge.   PERTINENT LABS:  The results of significant diagnostics from this hospitalization (including imaging, microbiology, ancillary and laboratory) are listed below for reference.    Microbiology: Recent Results (from the past 240 hour(s))  Culture, blood (routine x 2)     Status: None (Preliminary result)   Collection Time: 12/05/14 12:25 AM  Result Value Ref Range Status   Specimen Description BLOOD L HAND  Final   Special Requests BOTTLES DRAWN AEROBIC AND ANAEROBIC 8CC  Final   Culture   Final           BLOOD CULTURE RECEIVED NO GROWTH TO DATE CULTURE WILL BE HELD FOR 5  DAYS BEFORE ISSUING A FINAL NEGATIVE REPORT Performed at [facility]    Report Status PENDING  Incomplete  Culture, blood (routine x 2)     Status: None (Preliminary result)   Collection Time:  12/05/14 12:30 AM  Result Value Ref Range Status   Specimen Description BLOOD R HAND  Final   Special Requests BOTTLES DRAWN AEROBIC ONLY 10CC  Final   Culture   Final           BLOOD CULTURE RECEIVED NO GROWTH TO DATE CULTURE WILL BE HELD FOR 5 DAYS BEFORE ISSUING A FINAL NEGATIVE REPORT Performed at [facility]    Report Status PENDING  Incomplete  Culture, sputum-assessment     Status: None   Collection Time: 12/05/14  6:32 AM  Result Value Ref Range Status   Specimen Description SPUTUM  Final   Special Requests NONE  Final   Sputum evaluation   Final    MICROSCOPIC FINDINGS SUGGEST THAT THIS SPECIMEN IS NOT REPRESENTATIVE OF LOWER RESPIRATORY SECRETIONS. PLEASE RECOLLECT. CALLED TO ALVIAR,D AT [phone] ON [id number] BY HOOKER,B    Report Status 12/05/2014 FINAL  Final     Labs: Basic Metabolic Panel:  Recent Labs Lab 12/04/14 1557 12/05/14 0640 12/05/14 0646 12/06/14 0425  NA 138  --  136 138  K 4.1  --  4.9 4.8  CL 96  --  97 99  CO2 33*  --  32 34*  GLUCOSE 102*  --  134* 211*  BUN 15  --  13 14  CREATININE 0.69  --  0.93 0.82  CALCIUM 8.5  --  8.5 8.7  MG  --  1.9  --  1.8   Liver Function Tests:  Recent Labs Lab 12/04/14 1557 12/05/14 0646 12/06/14 0425  AST 31 33 24  ALT 36 32 26  ALKPHOS 52 55 54  BILITOT 0.6 0.6 0.6  PROT 6.7 6.7 6.0  ALBUMIN 3.8 3.9 3.4*    Recent Labs Lab 12/04/14 1557  LIPASE 44   CBC:  Recent Labs Lab 12/04/14 1557 12/05/14 0646 12/06/14 0425  WBC 11.4* 8.3 13.4*  HGB 14.9 14.2 13.0  HCT 45.0 44.4 40.9  MCV 93.4 95.5 96.0  PLT 246 212 198   Cardiac Enzymes:  Recent Labs Lab 12/05/14 0030 12/05/14 0646 12/05/14 1243  TROPONINI <0.03 <0.03 <0.03   CBG:  Recent Labs Lab 12/05/14 0735 12/06/14 0739  GLUCAP 114* 94     IMAGING STUDIES Dg Chest 2 View  12/04/2014   CLINICAL DATA:  Alcohol intoxication, abdominal pain.  EXAM: CHEST  2 VIEW  COMPARISON:  11/22/2014  FINDINGS: Multiple monitoring  leads overlie the patient. Stable cardiac and mediastinal contours. Lungs are hyperinflated. No large consolidative pulmonary opacities. No pleural effusion or pneumothorax. Old posttraumatic deformity of the right upper ribs and clavicle.  IMPRESSION: Pulmonary hyperinflation.  No acute cardiopulmonary process.   Electronically Signed   By: Lovey Newcomer M.D.   On: 12/04/2014 16:39   Dg Chest 2 View  11/22/2014   CLINICAL DATA:  Anxiety with abdominal pain. Hypoxemia with expiratory wheezing. History of COPD and hypertension. Initial encounter.  EXAM: CHEST  2 VIEW  COMPARISON:  10/04/2014 and 09/17/2014 radiographs.  FINDINGS: The heart size and mediastinal contours are stable. The lungs are hyperinflated but stable in appearance. There is right apical pleural thickening related to old right-sided rib and clavicle fractures. No pleural effusion, pneumothorax or acute osseous abnormalities identified.  IMPRESSION: Stable posttraumatic findings in  the upper right hemithorax and pulmonary hyperinflation. No acute findings identified.   Electronically Signed   By: Camie Patience M.D.   On: 11/22/2014 17:35   Ct Head Wo Contrast  11/22/2014   CLINICAL DATA:  Confusion  EXAM: CT HEAD WITHOUT CONTRAST  TECHNIQUE: Contiguous axial images were obtained from the base of the skull through the vertex without intravenous contrast.  COMPARISON:  11/17/2013  FINDINGS: No acute intracranial abnormality. Specifically, no hemorrhage, hydrocephalus, mass lesion, acute infarction, or significant intracranial injury. No acute calvarial abnormality. Visualized paranasal sinuses and mastoids clear. Orbital soft tissues unremarkable.  IMPRESSION: Negative.   Electronically Signed   By: Rolm Baptise M.D.   On: 11/22/2014 17:27   Ct Abdomen Pelvis W Contrast  12/04/2014   CLINICAL DATA:  59 year old male with generalized abdominal pain nausea vomiting. Current history of colostomy. Initial encounter.  EXAM: CT ABDOMEN AND PELVIS WITH  CONTRAST  TECHNIQUE: Multidetector CT imaging of the abdomen and pelvis was performed using the standard protocol following bolus administration of intravenous contrast.  CONTRAST:  15mL OMNIPAQUE IOHEXOL 300 MG/ML  SOLN  COMPARISON:  CT Abdomen and Pelvis 01/29/2014 and earlier  FINDINGS: negative lung bases. No pericardial or pleural effusion. No acute osseous abnormality identified.  No pelvic free fluid. Blind ending distal colon with some retained stool. Oral contrast distending the stomach and throughout numerous small bowel loops. Patulous bowel loop in the lower abdomen at the site of an anastomosis re- identified. The right and transverse colon are present. 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 No evidence of bowel obstruction.  Diminutive bladder. Liver, gallbladder, spleen, pancreas, adrenal glands, portal venous system, and kidneys are within normal limits. Aortoiliac calcified atherosclerosis noted. Major arterial structures are patent. No abdominal free fluid. No lymphadenopathy identified.  IMPRESSION: No bowel obstruction, acute or inflammatory finding identified in the abdomen or pelvis. Stable descending colostomy.   Electronically Signed   By: Genevie Ann M.D.   On: 12/04/2014 19:48    DISCHARGE EXAMINATION: Filed Vitals:   12/06/14 1029 12/06/14 1030 12/06/14 1117 12/06/14 1150  BP:      Pulse:      Temp:      TempSrc:      Resp:      Height:      Weight:      SpO2: 82% 94% 94% 95%   General appearance: alert, cooperative, appears stated age, no distress and hard of hearing Resp: few wheezes bilaterally. Much improved from yesterday. Cardio: regular rate and rhythm, S1, S2 normal, no murmur, click, rub or gallop GI: soft, non-tender; bowel sounds normal; no masses,  no organomegaly  DISPOSITION: home with home oxygen  Discharge Instructions    Call MD for:  difficulty breathing, headache or visual disturbances    Complete by:  As  directed      Call MD for:  extreme fatigue    Complete by:  As directed      Call MD for:  persistant dizziness or light-headedness    Complete by:  As directed      Call MD for:  persistant nausea and vomiting    Complete by:  As directed      Call MD for:  severe uncontrolled pain    Complete by:  As directed      Call MD for:  temperature >100.4    Complete by:  As directed      Diet - low sodium  heart healthy    Complete by:  As directed      Discharge instructions    Complete by:  As directed   Please stop smoking and drinking alcohol.     Increase activity slowly    Complete by:  As directed            ALLERGIES:  Allergies  Allergen Reactions  . Bee Venom Shortness Of Breath and Swelling  . Shellfish Allergy Anaphylaxis  . Codeine Itching and Rash     Current Discharge Medication List    START taking these medications   Details  guaiFENesin (MUCINEX) 600 MG 12 hr tablet Take 1 tablet (600 mg total) by mouth 2 (two) times daily. Qty: 30 tablet, Refills: 0    nicotine (NICODERM CQ - DOSED IN MG/24 HOURS) 14 mg/24hr patch Place 1 patch (14 mg total) onto the skin daily. Qty: 28 patch, Refills: 0    sucralfate (CARAFATE) 1 GM/10ML suspension Take 10 mLs (1 g total) by mouth 4 (four) times daily -  with meals and at bedtime. Qty: 420 mL, Refills: 0      CONTINUE these medications which have CHANGED   Details  azithromycin (ZITHROMAX) 250 MG tablet Take 1 tablet (250 mg total) by mouth daily. For 3 more days Qty: 3 each, Refills: 0    predniSONE (DELTASONE) 20 MG tablet Take 3 tablets once daily for 4 days, then take 2 tablets once daily for 4 days, then take 1 tablet once daily for 4 days, the STOP. Qty: 24 tablet, Refills: 0      CONTINUE these medications which have NOT CHANGED   Details  albuterol (PROVENTIL) (2.5 MG/3ML) 0.083% nebulizer solution Take 3 mLs (2.5 mg total) by nebulization every 4 (four) hours as needed for wheezing or shortness of  breath. Qty: 30 vial, Refills: 0    atorvastatin (LIPITOR) 20 MG tablet Take 1 tablet (20 mg total) by mouth every evening. Qty: 30 tablet, Refills: 0    LORazepam (ATIVAN) 1 MG tablet Take 1 mg by mouth daily. Refills: 3    omeprazole (PRILOSEC) 20 MG capsule Take 1 capsule (20 mg total) by mouth daily. Qty: 30 capsule, Refills: 2    QUEtiapine (SEROQUEL) 50 MG tablet Take 1 tablet (50 mg total) by mouth every morning. Qty: 30 tablet, Refills: 0    risperiDONE (RISPERDAL) 0.5 MG tablet Take 1 tablet (0.5 mg total) by mouth 2 (two) times daily. Qty: 30 tablet, Refills: 1    tiotropium (SPIRIVA) 18 MCG inhalation capsule Place 1 capsule (18 mcg total) into inhaler and inhale daily. Qty: 30 capsule, Refills: 0    albuterol-ipratropium (COMBIVENT) 18-103 MCG/ACT inhaler Inhale 2 puffs into the lungs every 4 (four) hours as needed for wheezing or shortness of breath. Qty: 1 Inhaler, Refills: 0    diazepam (VALIUM) 5 MG tablet Take 1 tablet (5 mg total) by mouth 3 (three) times daily. Qty: 90 tablet, Refills: 0    docusate sodium 100 MG CAPS Take 100 mg by mouth 2 (two) times daily. Qty: 10 capsule, Refills: 0    escitalopram (LEXAPRO) 10 MG tablet Take 1 tablet (10 mg total) by mouth daily. Qty: 30 tablet, Refills: 1    folic acid (FOLVITE) 1 MG tablet Take 1 tablet (1 mg total) by mouth daily. Qty: 30 tablet, Refills: 3    losartan (COZAAR) 50 MG tablet Take 1 tablet (50 mg total) by mouth daily. Qty: 30 tablet, Refills: 0    nitroGLYCERIN (  NITROSTAT) 0.4 MG SL tablet Place 1 tablet (0.4 mg total) under the tongue every 5 (five) minutes as needed for chest pain (up to 3 doses). Qty: 25 tablet, Refills: 0    thiamine 100 MG tablet Take 1 tablet (100 mg total) by mouth daily. Qty: 30 tablet, Refills: 0      STOP taking these medications     metoprolol tartrate (LOPRESSOR) 25 MG tablet      clotrimazole (LOTRIMIN) 1 % cream      doxycycline (VIBRAMYCIN) 100 MG capsule       metoprolol tartrate (LOPRESSOR) 12.5 mg TABS tablet      traMADol (ULTRAM) 50 MG tablet        Follow-up Information    Follow up with [facility].   Specialty:  Gastroenterology   Contact information:   [address] [place] [address] [phone]      Follow up with [name], NP. Schedule an appointment as soon as possible for a visit in 1 week.   Why:  post hospitalization follow up   Contact information:   [facility] [place] [address] [phone]       TOTAL DISCHARGE TIME: 35 minutes  [facility] Pager [phone]  12/06/2014, 12:21 PM

## 2014-12-06 NOTE — Progress Notes (Deleted)
Oxygen saturation while sitting down in chair was 94% room air, ambulated in hallway on room air o2 sat was 84%.

## 2014-12-06 NOTE — Progress Notes (Signed)
Patient walked with PT in hallway, o2 sat on 2l/[place] was 94%.

## 2014-12-06 NOTE — Evaluation (Addendum)
Physical Therapy Evaluation Patient Details Name: Zachary Walls MRN: [id number] DOB: 12-15-1955 Today's Date: 12/06/2014   History of Present Illness  Zachary Walls is a 59 y.o. male with past medical history of COPD, hypertension, hyperlipidemia, GERD, tobacco abuse, alcohol abuse, coronary artery disease, peripheral vascular disease, history of cardiac arrest, small bowel resection, current colostomy, who presents with multiple complaints, including nausea, vomiting, abdominal pain, chest discomfort, worsening cough and shortness of breath.  Clinical Impression  *Pt ambulated 250' without assistive device. He had mild LOB x2 but was able to self correct. SaO2 at rest 95% on RA, 86% on RA walking, 94% on 2L O2 [place] walking. Pt notified that he will need O2 when walking at home. From PT standpoint he seems to be at baseline with mobility and is ready to DC home. He has a walker at home but chooses not to use it. No further acute PT needs, will sign off.  He requested that [facility] agency delivering O2 to please call his son's phone as pt's phone isn't working. **    Follow Up Recommendations No PT follow up    Equipment Recommendations  Other (comment) (oxygen)    Recommendations for Other Services       Precautions / Restrictions Precautions Precaution Comments: monitor O2 Restrictions Weight Bearing Restrictions: No      Mobility  Bed Mobility Overal bed mobility: Independent                Transfers Overall transfer level: Modified independent               General transfer comment: pushed up on armrests for sit to stand  Ambulation/Gait Ambulation/Gait assistance: Supervision Ambulation Distance (Feet): 250 Feet Assistive device: None Gait Pattern/deviations: Decreased stride length   Gait velocity interpretation: at or above normal speed for age/gender General Gait Details: SaO2 86% on RA walking, 94% on 2L O2 [place] walking; pt had mild LOB x 2 but was able to self  correct  Stairs            Wheelchair Mobility    Modified Rankin (Stroke Patients Only)       Balance                                             Pertinent Vitals/Pain Pain Assessment: No/denies pain    Home Living Family/patient expects to be discharged to:: Private residence Living Arrangements: Alone Available Help at Discharge: Family;Friend(s);Available PRN/intermittently Type of Home: Mobile home Home Access: Stairs to enter Entrance Stairs-Rails: Left;Right;Can reach both Entrance Stairs-Number of Steps: 3-4 Home Layout: One level Home Equipment: Walker - 2 wheels Additional Comments: pt's son assists as needed    Prior Function Level of Independence: Independent         Comments: Has RW but doesn't use it.      Hand Dominance   Dominant Hand: Right    Extremity/Trunk Assessment   Upper Extremity Assessment: Overall WFL for tasks assessed           Lower Extremity Assessment: Overall WFL for tasks assessed (B knee extension 4/5, knee ext AROM -10* in sitting)      Cervical / Trunk Assessment: Normal  Communication   Communication: HOH  Cognition Arousal/Alertness: Awake/alert Behavior During Therapy: WFL for tasks assessed/performed Overall Cognitive Status: Within Functional Limits for tasks assessed  General Comments      Exercises        Assessment/Plan    PT Assessment Patent does not need any further PT services  PT Diagnosis Generalized weakness   PT Problem List    PT Treatment Interventions     PT Goals (Current goals can be found in the Care Plan section) Acute Rehab PT Goals Patient Stated Goal: to quit smoking, be able to walk farther without getting SOB PT Goal Formulation: All assessment and education complete, DC therapy    Frequency     Barriers to discharge        Co-evaluation               End of Session Equipment Utilized During Treatment:  Oxygen Activity Tolerance: Patient tolerated treatment well Patient left: in chair;with call bell/phone within reach Nurse Communication: Mobility status         Time: 5631-4970 PT Time Calculation (min) (ACUTE ONLY): 34 min   Charges:   PT Evaluation $Initial PT Evaluation Tier I: 1 Procedure PT Treatments $Gait Training: 8-22 mins   PT G Codes:        Philomena Doheny 12/06/2014, 12:10 PM [phone]

## 2014-12-06 NOTE — Progress Notes (Signed)
INITIAL NUTRITION ASSESSMENT  DOCUMENTATION CODES Per approved criteria  -Severe malnutrition in the context of chronic illness  Pt meets criteria for severe MALNUTRITION in the context of chronic illness as evidenced by severe muscle and fat depletion.  INTERVENTION: Continue Ensure Complete po BID, each supplement provides 350 kcal and 13 grams of protein Encourage PO intake  NUTRITION DIAGNOSIS: Malnutrition related to history of ETOH abuse as evidenced by severe muscle and fat depletion.   Goal: Pt to meet >/= 90% of their estimated nutrition needs   Monitor:  PO and supplemental intake, weight, labs, I/O's  Reason for Assessment: Pt identified as at nutrition risk on the Malnutrition Screen Tool  Admitting Dx: Abdominal pain  ASSESSMENT: 59 y.o. male with past medical history of COPD, hypertension, hyperlipidemia, GERD, tobacco abuse, alcohol abuse, coronary artery disease, peripheral vascular disease, history of cardiac arrest, small bowel resection, current colostomy, who presents with multiple complaints, including nausea, vomiting, abdominal pain, chest discomfort, worsening cough and shortness of breath.  Pt eating lunch during visit.  Pt reports not eating for 2-3 weeks. Pt states that he isn't smoking or drinking as much as he used to. Pt with ostomy. C/o gas. Pt states he is eating better now, PO intake: 100%.   Per weight history, pt's weight is up to 120 lb. Pt has been ordered Ensure supplements BID. Pt states he likes them and wants to continue to receive them.  Nutrition Focused Physical Exam:  Subcutaneous Fat:  Orbital Region: mild depletion Upper Arm Region: severe depletion Thoracic and Lumbar Region: NA  Muscle:  Temple Region: mild depletion Clavicle Bone Region: moderate depletion Clavicle and Acromion Bone Region: moderate depletion Scapular Bone Region: NA Dorsal Hand: WNL Patellar Region: severe depletion Anterior Thigh Region: severe  depletion Posterior Calf Region: severe depletion  Edema: no LE edema  Labs reviewed: Glucose 211  Height: Ht Readings from Last 1 Encounters:  12/04/14 5\' 7"  (1.702 m)    Weight: Wt Readings from Last 1 Encounters:  12/04/14 120 lb (54.432 kg)    Ideal Body Weight: 148 lb  % Ideal Body Weight: 81%  Wt Readings from Last 10 Encounters:  12/04/14 120 lb (54.432 kg)  07/05/14 104 lb (47.174 kg)  06/28/14 104 lb (47.174 kg)  01/29/14 104 lb (47.174 kg)  01/19/14 121 lb 11.2 oz (55.203 kg)  01/11/14 112 lb (50.803 kg)  12/10/13 106 lb 4.2 oz (48.2 kg)  11/26/13 97 lb 10.6 oz (44.3 kg)  08/27/13 108 lb 7.5 oz (49.2 kg)  03/25/13 107 lb 3.2 oz (48.626 kg)    Usual Body Weight: 122 lb  % Usual Body Weight: 98%  BMI:  Body mass index is 18.79 kg/(m^2).  Estimated Nutritional Needs: Kcal: 1650-1850 Protein: 75-85g Fluid: 1.7L/day  Skin: intact  Diet Order: Diet Heart Diet - low sodium heart healthy  EDUCATION NEEDS: -No education needs identified at this time   Intake/Output Summary (Last 24 hours) at 12/06/14 1254 Last data filed at 12/06/14 0600  Gross per 24 hour  Intake   3036 ml  Output   1525 ml  Net   1511 ml    Last BM: 2/13 (colostomy)  Labs:   Recent Labs Lab 12/04/14 1557 12/05/14 0640 12/05/14 0646 12/06/14 0425  NA 138  --  136 138  K 4.1  --  4.9 4.8  CL 96  --  97 99  CO2 33*  --  32 34*  BUN 15  --  13 14  CREATININE 0.69  --  0.93 0.82  CALCIUM 8.5  --  8.5 8.7  MG  --  1.9  --  1.8  GLUCOSE 102*  --  134* 211*    CBG (last 3)   Recent Labs  12/05/14 0735 12/06/14 0739  GLUCAP 114* 94    Scheduled Meds: . atorvastatin  20 mg Oral QPM  . azithromycin  250 mg Oral QHS  . diazepam  5 mg Oral TID  . docusate sodium  100 mg Oral BID  . escitalopram  10 mg Oral Daily  . feeding supplement (ENSURE COMPLETE)  237 mL Oral BID BM  . folic acid  1 mg Oral Daily  . guaiFENesin  600 mg Oral BID  . heparin  5,000 Units  Subcutaneous 3 times per day  . ipratropium-albuterol  3 mL Nebulization TID  . LORazepam  0-4 mg Intravenous Q6H   Followed by  . [START ON 12/07/2014] LORazepam  0-4 mg Intravenous Q12H  . losartan  50 mg Oral Daily  . multivitamin with minerals  1 tablet Oral Daily  . nicotine  14 mg Transdermal Daily  . pantoprazole  40 mg Oral Daily  . predniSONE  60 mg Oral Q breakfast  . QUEtiapine  50 mg Oral q morning - 10a  . risperiDONE  0.5 mg Oral BID  . thiamine  100 mg Oral Daily    Continuous Infusions:   Past Medical History  Diagnosis Date  . COPD (chronic obstructive pulmonary disease)   . History of pneumonia   . Hypertension     a. Normotensive 08/2013 off meds.  [name] HOH (hard of hearing)   . Protein calorie malnutrition   . ETOH abuse   . Tobacco abuse   . Diverticulitis     a. s/p colon resection and colostomy  . Cardiac arrest     a. 01/2013, felt to be 2/2 severe COPD req trach;  b. 01/2013 Echo: EF 65-70% w/o rwma.  . Difficult intubation   . CAD (coronary artery disease)     a. Nonobstructive CAD by cath 08/2013 (mod RCA, mid LAD myocardial bridge) - ECG was concerning for inf STEMI but pt ruled out. CP possibly GERD.   [name] Hyponatremia     a. During 08/2013 felt due to polydipsia.  . Polysubstance abuse     a. Tobacco, marijuana, alcoholism.  [name] Anxiety   . Shortness of breath   . Peripheral vascular disease     Past Surgical History  Procedure Laterality Date  . Hernia repair      inguinal  . Colostomy  12/11/2011    Procedure: COLOSTOMY;  Surgeon: Rolm Bookbinder, MD;  Location: [facility];  Service: General;  Laterality: N/A;  . Colostomy revision  12/11/2011    Procedure: COLON RESECTION SIGMOID;  Surgeon: Rolm Bookbinder, MD;  Location: [facility];  Service: General;;  . Bowel resection  12/11/2011    Procedure: SMALL BOWEL RESECTION;  Surgeon: Rolm Bookbinder, MD;  Location: [facility];  Service: General;;  times 2  . Cystoscopy w/ ureteral stent placement   12/11/2011    Procedure: CYSTOSCOPY WITH STENT REPLACEMENT;  Surgeon: Malka So, MD;  Location: [facility];  Service: Urology;  Laterality: Left;  ureteral catheter placement  . Cardiac catheterization  08/25/2013  . Tracheostomy  01/2013    emergent d/t difficult intubation  . Colon surgery    . Tracheostomy closure    . Left heart catheterization with coronary angiogram N/A 08/25/2013  Procedure: LEFT HEART CATHETERIZATION WITH CORONARY ANGIOGRAM;  Surgeon: Wellington Hampshire, MD;  Location: [facility];  Service: Cardiovascular;  Laterality: N/A;    Clayton Bibles, MS, RD, LDN Pager: [phone] After Hours Pager: [phone]

## 2014-12-06 NOTE — Progress Notes (Signed)
SATURATION QUALIFICATIONS: (This note is used to comply with regulatory documentation for home oxygen)  Patient Saturations on Room Air at Rest =94  Patient Saturations on Room Air while Ambulating = 82  Patient Saturations on 2iters of oxygen while Ambulating = 94  Please briefly explain why patient needs home oxygen:desaturates while walking on RA

## 2014-12-06 NOTE — Progress Notes (Signed)
Oxygen saturation while walking on room air was 82%. While sitting down on room air , o2 sat was 94%

## 2014-12-07 ENCOUNTER — Inpatient Hospital Stay: Payer: Medicaid Other | Admitting: Adult Health

## 2014-12-08 ENCOUNTER — Emergency Department (HOSPITAL_COMMUNITY)
Admission: EM | Admit: 2014-12-08 | Discharge: 2014-12-08 | Disposition: A | Discharge status: Home / Self Care | Payer: Medicaid Other | Attending: Emergency Medicine | Admitting: Emergency Medicine

## 2014-12-08 ENCOUNTER — Encounter (HOSPITAL_COMMUNITY): Payer: Self-pay

## 2014-12-08 ENCOUNTER — Emergency Department (HOSPITAL_COMMUNITY): Payer: Medicaid Other

## 2014-12-08 DIAGNOSIS — Z9889 Other specified postprocedural states: Secondary | ICD-10-CM | POA: Diagnosis not present

## 2014-12-08 DIAGNOSIS — I10 Essential (primary) hypertension: Secondary | ICD-10-CM | POA: Diagnosis not present

## 2014-12-08 DIAGNOSIS — F419 Anxiety disorder, unspecified: Secondary | ICD-10-CM | POA: Diagnosis not present

## 2014-12-08 DIAGNOSIS — H9190 Unspecified hearing loss, unspecified ear: Secondary | ICD-10-CM | POA: Insufficient documentation

## 2014-12-08 DIAGNOSIS — Z720 Tobacco use: Secondary | ICD-10-CM | POA: Insufficient documentation

## 2014-12-08 DIAGNOSIS — R0602 Shortness of breath: Secondary | ICD-10-CM

## 2014-12-08 DIAGNOSIS — G8929 Other chronic pain: Secondary | ICD-10-CM | POA: Insufficient documentation

## 2014-12-08 DIAGNOSIS — I2510 Atherosclerotic heart disease of native coronary artery without angina pectoris: Secondary | ICD-10-CM | POA: Insufficient documentation

## 2014-12-08 DIAGNOSIS — R109 Unspecified abdominal pain: Secondary | ICD-10-CM | POA: Diagnosis not present

## 2014-12-08 DIAGNOSIS — Z8701 Personal history of pneumonia (recurrent): Secondary | ICD-10-CM | POA: Insufficient documentation

## 2014-12-08 DIAGNOSIS — Z8719 Personal history of other diseases of the digestive system: Secondary | ICD-10-CM | POA: Insufficient documentation

## 2014-12-08 DIAGNOSIS — Z8739 Personal history of other diseases of the musculoskeletal system and connective tissue: Secondary | ICD-10-CM | POA: Insufficient documentation

## 2014-12-08 DIAGNOSIS — Z8674 Personal history of sudden cardiac arrest: Secondary | ICD-10-CM | POA: Diagnosis not present

## 2014-12-08 DIAGNOSIS — J441 Chronic obstructive pulmonary disease with (acute) exacerbation: Secondary | ICD-10-CM | POA: Diagnosis not present

## 2014-12-08 LAB — CBC WITH DIFFERENTIAL/PLATELET
BASOS ABS: 0 10*3/uL (ref 0.0–0.1)
Basophils Relative: 0 % (ref 0–1)
EOS PCT: 0 % (ref 0–5)
Eosinophils Absolute: 0 10*3/uL (ref 0.0–0.7)
HEMATOCRIT: 44.3 % (ref 39.0–52.0)
HEMOGLOBIN: 14 g/dL (ref 13.0–17.0)
LYMPHS ABS: 1.1 10*3/uL (ref 0.7–4.0)
LYMPHS PCT: 10 % — AB (ref 12–46)
MCH: 30.7 pg (ref 26.0–34.0)
MCHC: 31.6 g/dL (ref 30.0–36.0)
MCV: 97.1 fL (ref 78.0–100.0)
MONO ABS: 0.4 10*3/uL (ref 0.1–1.0)
MONOS PCT: 4 % (ref 3–12)
Neutro Abs: 9 10*3/uL — ABNORMAL HIGH (ref 1.7–7.7)
Neutrophils Relative %: 86 % — ABNORMAL HIGH (ref 43–77)
PLATELETS: 220 10*3/uL (ref 150–400)
RBC: 4.56 MIL/uL (ref 4.22–5.81)
RDW: 14.1 % (ref 11.5–15.5)
WBC: 10.4 10*3/uL (ref 4.0–10.5)

## 2014-12-08 LAB — BASIC METABOLIC PANEL
Anion gap: 6 (ref 5–15)
BUN: 14 mg/dL (ref 6–23)
CHLORIDE: 95 mmol/L — AB (ref 96–112)
CO2: 36 mmol/L — ABNORMAL HIGH (ref 19–32)
CREATININE: 0.74 mg/dL (ref 0.50–1.35)
Calcium: 9.4 mg/dL (ref 8.4–10.5)
GFR calc non Af Amer: >90 mL/min (ref >90)
GLUCOSE: 112 mg/dL — AB (ref 70–99)
Potassium: 4.8 mmol/L (ref 3.5–5.1)
SODIUM: 137 mmol/L (ref 135–145)

## 2014-12-08 LAB — TROPONIN I

## 2014-12-08 MED ORDER — ALBUTEROL SULFATE (2.5 MG/3ML) 0.083% IN NEBU
5.0000 mg | INHALATION_SOLUTION | Freq: Once | RESPIRATORY_TRACT | Status: AC
  Administered 2014-12-08: 5 mg via RESPIRATORY_TRACT
  Filled 2014-12-08: qty 6

## 2014-12-08 MED ORDER — PREDNISONE 20 MG PO TABS
60.0000 mg | ORAL_TABLET | Freq: Once | ORAL | Status: AC
  Administered 2014-12-08: 60 mg via ORAL
  Filled 2014-12-08: qty 3

## 2014-12-08 NOTE — Discharge Instructions (Signed)
Be sure to take all medications exactly as prescribed.   Shortness of Breath Shortness of breath means you have trouble breathing. It could also mean that you have a medical problem. You should get immediate medical care for shortness of breath. CAUSES   Not enough oxygen in the air such as with high altitudes or a smoke-filled room.  Certain lung diseases, infections, or problems.  Heart disease or conditions, such as angina or heart failure.  Low red blood cells (anemia).  Poor physical fitness, which can cause shortness of breath when you exercise.  Chest or back injuries or stiffness.  Being overweight.  Smoking.  Anxiety, which can make you feel like you are not getting enough air. DIAGNOSIS  Serious medical problems can often be found during your physical exam. Tests may also be done to determine why you are having shortness of breath. Tests may include:  Chest X-rays.  Lung function tests.  Blood tests.  An electrocardiogram (ECG).  An ambulatory electrocardiogram. An ambulatory ECG records your heartbeat patterns over a 24-hour period.  Exercise testing.  A transthoracic echocardiogram (TTE). During echocardiography, sound waves are used to evaluate how blood flows through your heart.  A transesophageal echocardiogram (TEE).  Imaging scans. Your health care provider may not be able to find a cause for your shortness of breath after your exam. In this case, it is important to have a follow-up exam with your health care provider as directed.  TREATMENT  Treatment for shortness of breath depends on the cause of your symptoms and can vary greatly. HOME CARE INSTRUCTIONS   Do not smoke. Smoking is a common cause of shortness of breath. If you smoke, ask for help to quit.  Avoid being around chemicals or things that may bother your breathing, such as paint fumes and dust.  Rest as needed. Slowly resume your usual activities.  If medicines were prescribed, take  them as directed for the full length of time directed. This includes oxygen and any inhaled medicines.  Keep all follow-up appointments as directed by your health care provider. SEEK MEDICAL CARE IF:   Your condition does not improve in the time expected.  You have a hard time doing your normal activities even with rest.  You have any new symptoms. SEEK IMMEDIATE MEDICAL CARE IF:   Your shortness of breath gets worse.  You feel light-headed, faint, or develop a cough not controlled with medicines.  You start coughing up blood.  You have pain with breathing.  You have chest pain or pain in your arms, shoulders, or abdomen.  You have a fever.  You are unable to walk up stairs or exercise the way you normally do. MAKE SURE YOU:  Understand these instructions.  Will watch your condition.  Will get help right away if you are not doing well or get worse. Document Released: 07/03/2001 Document Revised: 10/13/2013 Document Reviewed: 12/24/2011 [facility] Patient Information 2015 [place], [place]. This information is not intended to replace advice given to you by your health care provider. Make sure you discuss any questions you have with your health care provider.

## 2014-12-08 NOTE — ED Notes (Signed)
Went into room to check vitals and the pt was standing at the foot of the bed.  He stated he had to urinate and could not find a urinal.  I changed the pt into scrub pants because the patient's pants and underwear were covered in urine.  I also changed the linen and repositioned some of his EKG leads which came off after he got off the bed.   QA

## 2014-12-08 NOTE — ED Notes (Signed)
Pt ambulated with pulsox, oxygen remained 97-100%, pulse 71-86 bpm.

## 2014-12-08 NOTE — ED Notes (Signed)
Pt to xray

## 2014-12-08 NOTE — ED Notes (Signed)
Pt presents via EMS from home with c/o shortness of breath. Pt seen recently for same. Pt was recently placed on home O2 and told to follow up with COPD clinic with pt has not done as of yet. Pt reported to EMS that he took his O2 off to walk to his mailbox and became short of breath. Pt also has various other complaints including abdominal pain, headache, pain in his hernia, and patient and swelling around his colostomy bag. Upon arrival to ER, pt reports that he started to feel better because he took his lorazepam prior to EMS arrival. Pt also has hx of panic attacks. Ambulatory to EMS stretcher.

## 2014-12-08 NOTE — ED Provider Notes (Signed)
CSN: [id number]     Arrival date & time 12/08/14  1346 History   First MD Initiated Contact with Patient 12/08/14 1447     Chief Complaint  Patient presents with  . Shortness of Breath     (Consider location/radiation/quality/duration/timing/severity/associated sxs/prior Treatment) Patient is a 59 y.o. male presenting with shortness of breath.  Shortness of Breath Severity:  Severe Onset quality:  Sudden Timing:  Constant Progression:  Partially resolved Chronicity:  Recurrent Context comment:  Discharged from the hospital two days ago, received his home O2 last night, he did not wear the oxygen and tried to walk to the mailbox, at which time he became severely dyspneic.   Relieved by:  Rest and oxygen Worsened by:  Exertion Associated symptoms: abdominal pain (chronic) and cough   Associated symptoms: no chest pain and no fever     Past Medical History  Diagnosis Date  . COPD (chronic obstructive pulmonary disease)   . History of pneumonia   . Hypertension     a. Normotensive 08/2013 off meds.  [name] HOH (hard of hearing)   . Protein calorie malnutrition   . ETOH abuse   . Tobacco abuse   . Diverticulitis     a. s/p colon resection and colostomy  . Cardiac arrest     a. 01/2013, felt to be 2/2 severe COPD req trach;  b. 01/2013 Echo: EF 65-70% w/o rwma.  . Difficult intubation   . CAD (coronary artery disease)     a. Nonobstructive CAD by cath 08/2013 (mod RCA, mid LAD myocardial bridge) - ECG was concerning for inf STEMI but pt ruled out. CP possibly GERD.   [name] Hyponatremia     a. During 08/2013 felt due to polydipsia.  . Polysubstance abuse     a. Tobacco, marijuana, alcoholism.  [name] Anxiety   . Shortness of breath   . Peripheral vascular disease    Past Surgical History  Procedure Laterality Date  . Hernia repair      inguinal  . Colostomy  12/11/2011    Procedure: COLOSTOMY;  Surgeon: Rolm Bookbinder, MD;  Location: [facility];  Service: General;  Laterality: N/A;  .  Colostomy revision  12/11/2011    Procedure: COLON RESECTION SIGMOID;  Surgeon: Rolm Bookbinder, MD;  Location: [facility];  Service: General;;  . Bowel resection  12/11/2011    Procedure: SMALL BOWEL RESECTION;  Surgeon: Rolm Bookbinder, MD;  Location: [facility];  Service: General;;  times 2  . Cystoscopy w/ ureteral stent placement  12/11/2011    Procedure: CYSTOSCOPY WITH STENT REPLACEMENT;  Surgeon: Malka So, MD;  Location: [facility];  Service: Urology;  Laterality: Left;  ureteral catheter placement  . Cardiac catheterization  08/25/2013  . Tracheostomy  01/2013    emergent d/t difficult intubation  . Colon surgery    . Tracheostomy closure    . Left heart catheterization with coronary angiogram N/A 08/25/2013    Procedure: LEFT HEART CATHETERIZATION WITH CORONARY ANGIOGRAM;  Surgeon: Wellington Hampshire, MD;  Location: [facility];  Service: Cardiovascular;  Laterality: N/A;   Family History  Problem Relation Age of Onset  . Heart attack Father   . Cancer Father     unknown type  . HIV Brother    History  Substance Use Topics  . Smoking status: Current Every Day Smoker -- 0.50 packs/day for 44 years    Types: Cigarettes    Last Attempt to Quit: 11/03/2011  . Smokeless tobacco: Never Used  Comment: Has smoked as much as 2-3 packs/day.  Now can make a pack last a week.  . Alcohol Use: Yes     Comment: Previously drank heavily.  Admits to drinking 1-3 40 oz beers most days of the week.    Review of Systems  Constitutional: Negative for fever.  Respiratory: Positive for cough and shortness of breath.   Cardiovascular: Negative for chest pain.  Gastrointestinal: Positive for abdominal pain (chronic).  All other systems reviewed and are negative.     Allergies  Bee venom; Shellfish allergy; and Codeine  Home Medications   Prior to Admission medications   Medication Sig Start Date End Date Taking? Authorizing Provider  albuterol (PROVENTIL HFA;VENTOLIN HFA) 108 (90 BASE)  MCG/ACT inhaler Inhale 2 puffs into the lungs every 6 (six) hours as needed for wheezing or shortness of breath (wheezing).   Yes Historical Provider, MD  albuterol (PROVENTIL) (2.5 MG/3ML) 0.083% nebulizer solution Take 3 mLs (2.5 mg total) by nebulization every 4 (four) hours as needed for wheezing or shortness of breath. 11/22/14  Yes Ephraim Hamburger, MD  atorvastatin (LIPITOR) 20 MG tablet Take 1 tablet (20 mg total) by mouth every evening. 08/09/14  Yes Noland Fordyce, PA-C  azithromycin (ZITHROMAX) 250 MG tablet Take 1 tablet (250 mg total) by mouth daily. For 3 more days 12/06/14  Yes Bonnielee Haff, MD  diazepam (VALIUM) 5 MG tablet Take 1 tablet (5 mg total) by mouth 3 (three) times daily. 02/08/14  Yes Tiffany L Reed, DO  escitalopram (LEXAPRO) 10 MG tablet Take 1 tablet (10 mg total) by mouth daily. 11/26/13  Yes Annita Brod, MD  LORazepam (ATIVAN) 1 MG tablet Take 1 mg by mouth daily. 11/01/14  Yes Historical Provider, MD  metoprolol tartrate (LOPRESSOR) 25 MG tablet Take 25 mg by mouth 2 (two) times daily.   Yes Historical Provider, MD  nitroGLYCERIN (NITROSTAT) 0.4 MG SL tablet Place 1 tablet (0.4 mg total) under the tongue every 5 (five) minutes as needed for chest pain (up to 3 doses). 08/09/14  Yes Noland Fordyce, PA-C  omeprazole (PRILOSEC) 20 MG capsule Take 1 capsule (20 mg total) by mouth daily. 08/09/14  Yes Noland Fordyce, PA-C  predniSONE (DELTASONE) 20 MG tablet Take 3 tablets once daily for 4 days, then take 2 tablets once daily for 4 days, then take 1 tablet once daily for 4 days, the STOP. 12/06/14  Yes Bonnielee Haff, MD  QUEtiapine (SEROQUEL) 50 MG tablet Take 1 tablet (50 mg total) by mouth every morning. 06/28/14  Yes Antonietta Breach, PA-C  risperiDONE (RISPERDAL) 0.5 MG tablet Take 1 tablet (0.5 mg total) by mouth 2 (two) times daily. 11/26/13  Yes Annita Brod, MD  tiotropium (SPIRIVA) 18 MCG inhalation capsule Place 1 capsule (18 mcg total) into inhaler and inhale daily.  06/28/14  Yes Antonietta Breach, PA-C  albuterol-ipratropium (COMBIVENT) 18-103 MCG/ACT inhaler Inhale 2 puffs into the lungs every 4 (four) hours as needed for wheezing or shortness of breath. Patient not taking: Reported on 12/08/2014 08/09/14   Noland Fordyce, PA-C  docusate sodium 100 MG CAPS Take 100 mg by mouth 2 (two) times daily. 02/06/14   Megan N Dort, PA-C  folic acid (FOLVITE) 1 MG tablet Take 1 tablet (1 mg total) by mouth daily. 08/27/13   Dayna N Dunn, PA-C  guaiFENesin (MUCINEX) 600 MG 12 hr tablet Take 1 tablet (600 mg total) by mouth 2 (two) times daily. 12/06/14   Bonnielee Haff, MD  losartan (COZAAR)  50 MG tablet Take 1 tablet (50 mg total) by mouth daily. 08/09/14   Noland Fordyce, PA-C  nicotine (NICODERM CQ - DOSED IN MG/24 HOURS) 14 mg/24hr patch Place 1 patch (14 mg total) onto the skin daily. 12/06/14   Bonnielee Haff, MD  sucralfate (CARAFATE) 1 GM/10ML suspension Take 10 mLs (1 g total) by mouth 4 (four) times daily -  with meals and at bedtime. Patient not taking: Reported on 12/08/2014 12/06/14   Bonnielee Haff, MD  thiamine 100 MG tablet Take 1 tablet (100 mg total) by mouth daily. Patient not taking: Reported on 12/08/2014 01/20/14   Thurnell Lose, MD   BP 144/83 mmHg  Pulse 81  Temp(Src) 97.5 F (36.4 C) (Oral)  Resp 18  SpO2 98% Physical Exam  Constitutional: He is oriented to person, place, and time. He appears well-developed and well-nourished. No distress.  HENT:  Head: Normocephalic and atraumatic.  Mouth/Throat: Oropharynx is clear and moist.  Eyes: Conjunctivae are normal. Pupils are equal, round, and reactive to light. No scleral icterus.  Neck: Neck supple.  Cardiovascular: Normal rate, regular rhythm, normal heart sounds and intact distal pulses.   No murmur heard. Pulmonary/Chest: Effort normal. No stridor. No respiratory distress. He has wheezes (diffuse, mild). He has no rales.  Abdominal: Soft. He exhibits no distension. There is no tenderness. There is no  rigidity, no rebound and no guarding.  Surgical scars and ostomy bag present.    Musculoskeletal: Normal range of motion. He exhibits no edema.  Neurological: He is alert and oriented to person, place, and time.  Skin: Skin is warm and dry. No rash noted.  Psychiatric: He has a normal mood and affect. His behavior is normal.  Nursing note and vitals reviewed.   ED Course  Procedures (including critical care time) Labs Review Labs Reviewed  CBC WITH DIFFERENTIAL/PLATELET - Abnormal; Notable for the following:    Neutrophils Relative % 86 (*)    Neutro Abs 9.0 (*)    Lymphocytes Relative 10 (*)    All other components within normal limits  TROPONIN I  BASIC METABOLIC PANEL    Imaging Review Dg Chest 2 View  12/08/2014   CLINICAL DATA:  Shortness of breath today due to an anxiety attack. Smoker. Alcohol abuse.  EXAM: CHEST  2 VIEW  COMPARISON:  12/04/2014.  FINDINGS: Normal sized heart. Clear lungs. The lungs remain hyperexpanded. Mild thoracic spine degenerative changes. Old, incompletely healed distal right clavicle fracture. Old upper right rib fractures.  IMPRESSION: 1. No acute abnormality. 2. Stable changes of COPD.   Electronically Signed   By: Claudie Revering M.D.   On: 12/08/2014 15:36  All radiology studies independently viewed by me.      EKG Interpretation   Date/Time:  Wednesday December 08 2014 13:56:05 EST Ventricular Rate:  74 PR Interval:  113 QRS Duration: 97 QT Interval:  380 QTC Calculation: 422 R Axis:   95 Text Interpretation:  Sinus rhythm Borderline short PR interval Right  atrial enlargement Borderline right axis deviation Abnormal R-wave  progression, late transition Abnrm T, consider ischemia, anterolateral lds  nonspecific t wave changes are similar to multiple prior. Confirmed by  [facility]  MD, TREY ([id number]) on 12/08/2014 3:37:42 PM      MDM   Final diagnoses:  Shortness of breath    59 yo male with hx of COPD who presents with shortness of  breath.  Per his history, his dyspnea can be attributed to his not using his oxygen  when he went to the mailbox.  I advised him to use his oxygen as directed.  I also counted his prednisone, and he appears to not be taking it correctly.  He has taken two 20mg  tabs since discharge two days ago.  I advised him to follow the instructions on the pill bottle closely.    Plan screening labs, breathing treatment, ambulate with oxygen.  Anticipate discharge back home.     Houston Siren III, MD 12/08/14 [phone]

## 2014-12-08 NOTE — ED Notes (Signed)
MD at bedside. 

## 2014-12-08 NOTE — ED Notes (Signed)
Bed: ZL93 Expected date: 12/08/14 Expected time: 11:10 AM Means of arrival:  Comments: EMS-[facility]

## 2014-12-11 LAB — CULTURE, BLOOD (ROUTINE X 2)
CULTURE: NO GROWTH
Culture: NO GROWTH

## 2015-07-14 ENCOUNTER — Other Ambulatory Visit: Payer: Self-pay

## 2015-07-14 ENCOUNTER — Emergency Department: Payer: Medicaid Other

## 2015-07-14 ENCOUNTER — Emergency Department
Admission: EM | Admit: 2015-07-14 | Discharge: 2015-07-15 | Disposition: A | Discharge status: Home / Self Care | Payer: Medicaid Other | Attending: Emergency Medicine | Admitting: Emergency Medicine

## 2015-07-14 DIAGNOSIS — Z720 Tobacco use: Secondary | ICD-10-CM | POA: Diagnosis not present

## 2015-07-14 DIAGNOSIS — Z792 Long term (current) use of antibiotics: Secondary | ICD-10-CM | POA: Insufficient documentation

## 2015-07-14 DIAGNOSIS — Z8546 Personal history of malignant neoplasm of prostate: Secondary | ICD-10-CM | POA: Diagnosis not present

## 2015-07-14 DIAGNOSIS — Z9049 Acquired absence of other specified parts of digestive tract: Secondary | ICD-10-CM | POA: Insufficient documentation

## 2015-07-14 DIAGNOSIS — R1903 Right lower quadrant abdominal swelling, mass and lump: Secondary | ICD-10-CM | POA: Diagnosis not present

## 2015-07-14 DIAGNOSIS — R1031 Right lower quadrant pain: Secondary | ICD-10-CM

## 2015-07-14 DIAGNOSIS — R634 Abnormal weight loss: Secondary | ICD-10-CM | POA: Diagnosis not present

## 2015-07-14 DIAGNOSIS — Z79899 Other long term (current) drug therapy: Secondary | ICD-10-CM | POA: Insufficient documentation

## 2015-07-14 DIAGNOSIS — I10 Essential (primary) hypertension: Secondary | ICD-10-CM | POA: Insufficient documentation

## 2015-07-14 HISTORY — DX: Malignant neoplasm of prostate: C61

## 2015-07-14 LAB — COMPREHENSIVE METABOLIC PANEL
ALK PHOS: 57 U/L (ref 38–126)
ALT: 11 U/L — AB (ref 17–63)
AST: 21 U/L (ref 15–41)
Albumin: 3.8 g/dL (ref 3.5–5.0)
Anion gap: 7 (ref 5–15)
BUN: 10 mg/dL (ref 6–20)
CALCIUM: 9 mg/dL (ref 8.9–10.3)
CHLORIDE: 93 mmol/L — AB (ref 101–111)
CO2: 38 mmol/L — AB (ref 22–32)
CREATININE: 0.99 mg/dL (ref 0.61–1.24)
GFR calc Af Amer: >60 mL/min (ref >60)
GFR calc non Af Amer: >60 mL/min (ref >60)
GLUCOSE: 77 mg/dL (ref 65–99)
Potassium: 4.1 mmol/L (ref 3.5–5.1)
SODIUM: 138 mmol/L (ref 135–145)
Total Bilirubin: 0.8 mg/dL (ref 0.3–1.2)
Total Protein: 6.7 g/dL (ref 6.5–8.1)

## 2015-07-14 LAB — URINALYSIS COMPLETE WITH MICROSCOPIC (ARMC ONLY)
BACTERIA UA: NONE SEEN
BILIRUBIN URINE: NEGATIVE
Glucose, UA: NEGATIVE mg/dL
HGB URINE DIPSTICK: NEGATIVE
Ketones, ur: NEGATIVE mg/dL
LEUKOCYTES UA: NEGATIVE
Nitrite: NEGATIVE
PH: 7 (ref 5.0–8.0)
PROTEIN: NEGATIVE mg/dL
RBC / HPF: NONE SEEN RBC/hpf (ref 0–5)
Specific Gravity, Urine: 1.012 (ref 1.005–1.030)

## 2015-07-14 LAB — CBC WITH DIFFERENTIAL/PLATELET
BASOS ABS: 0.1 10*3/uL (ref 0–0.1)
Basophils Relative: 1 %
EOS ABS: 0.1 10*3/uL (ref 0–0.7)
EOS PCT: 1 %
HCT: 38.3 % — ABNORMAL LOW (ref 40.0–52.0)
HEMOGLOBIN: 12.9 g/dL — AB (ref 13.0–18.0)
LYMPHS ABS: 2.4 10*3/uL (ref 1.0–3.6)
LYMPHS PCT: 27 %
MCH: 29.9 pg (ref 26.0–34.0)
MCHC: 33.6 g/dL (ref 32.0–36.0)
MCV: 89.1 fL (ref 80.0–100.0)
Monocytes Absolute: 0.8 10*3/uL (ref 0.2–1.0)
Monocytes Relative: 9 %
NEUTROS PCT: 62 %
Neutro Abs: 5.6 10*3/uL (ref 1.4–6.5)
PLATELETS: 178 10*3/uL (ref 150–440)
RBC: 4.3 MIL/uL — AB (ref 4.40–5.90)
RDW: 15 % — ABNORMAL HIGH (ref 11.5–14.5)
WBC: 8.9 10*3/uL (ref 3.8–10.6)

## 2015-07-14 LAB — LIPASE, BLOOD: Lipase: 34 U/L (ref 22–51)

## 2015-07-14 LAB — TROPONIN I: Troponin I: <0.03 ng/mL (ref <0.031)

## 2015-07-14 MED ORDER — SODIUM CHLORIDE 0.9 % IV BOLUS (SEPSIS)
1000.0000 mL | Freq: Once | INTRAVENOUS | Status: AC
  Administered 2015-07-14: 1000 mL via INTRAVENOUS

## 2015-07-14 MED ORDER — FENTANYL CITRATE (PF) 100 MCG/2ML IJ SOLN
25.0000 ug | Freq: Once | INTRAMUSCULAR | Status: AC
  Administered 2015-07-14: 25 ug via INTRAVENOUS
  Filled 2015-07-14: qty 2

## 2015-07-14 MED ORDER — ONDANSETRON HCL 4 MG/2ML IJ SOLN
4.0000 mg | Freq: Once | INTRAMUSCULAR | Status: AC
  Administered 2015-07-14: 4 mg via INTRAVENOUS
  Filled 2015-07-14: qty 2

## 2015-07-14 MED ORDER — IOHEXOL 240 MG/ML SOLN
50.0000 mL | Freq: Once | INTRAMUSCULAR | Status: AC | PRN
  Administered 2015-07-14: 50 mL via ORAL

## 2015-07-14 MED ORDER — IOHEXOL 300 MG/ML  SOLN
75.0000 mL | Freq: Once | INTRAMUSCULAR | Status: AC | PRN
  Administered 2015-07-14: 75 mL via INTRAVENOUS

## 2015-07-14 NOTE — Discharge Instructions (Signed)
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
## 2015-07-14 NOTE — ED Notes (Signed)
According to EMS, pt has had right lower ab quadrant pain for 10 months. Pt states he has observed "maggots" in his colostomy bag.  HX COPD, PROSTATE CA  130/76 84 90% RA

## 2015-09-23 ENCOUNTER — Emergency Department
Admission: EM | Admit: 2015-09-23 | Discharge: 2015-09-24 | Disposition: A | Discharge status: Home / Self Care | Payer: Medicaid Other | Source: Home / Self Care | Attending: Emergency Medicine | Admitting: Emergency Medicine

## 2015-09-23 ENCOUNTER — Emergency Department: Payer: Medicaid Other

## 2015-09-23 ENCOUNTER — Encounter: Payer: Self-pay | Admitting: Emergency Medicine

## 2015-09-23 DIAGNOSIS — R55 Syncope and collapse: Secondary | ICD-10-CM

## 2015-09-23 LAB — COMPREHENSIVE METABOLIC PANEL
ALBUMIN: 3.5 g/dL (ref 3.5–5.0)
ALK PHOS: 71 U/L (ref 38–126)
ALT: 8 U/L — AB (ref 17–63)
ANION GAP: 1 — AB (ref 5–15)
AST: 16 U/L (ref 15–41)
BILIRUBIN TOTAL: 0.7 mg/dL (ref 0.3–1.2)
BUN: 6 mg/dL (ref 6–20)
CALCIUM: 9.1 mg/dL (ref 8.9–10.3)
CO2: 48 mmol/L — AB (ref 22–32)
CREATININE: 0.69 mg/dL (ref 0.61–1.24)
Chloride: 88 mmol/L — ABNORMAL LOW (ref 101–111)
GFR calc Af Amer: >60 mL/min (ref >60)
GFR calc non Af Amer: >60 mL/min (ref >60)
GLUCOSE: 107 mg/dL — AB (ref 65–99)
Potassium: 4.6 mmol/L (ref 3.5–5.1)
SODIUM: 137 mmol/L (ref 135–145)
Total Protein: 7.2 g/dL (ref 6.5–8.1)

## 2015-09-23 LAB — URINE DRUG SCREEN, QUALITATIVE (ARMC ONLY)
Amphetamines, Ur Screen: NOT DETECTED
BARBITURATES, UR SCREEN: NOT DETECTED
BENZODIAZEPINE, UR SCRN: NOT DETECTED
Cannabinoid 50 Ng, Ur ~~LOC~~: POSITIVE — AB
Cocaine Metabolite,Ur ~~LOC~~: NOT DETECTED
MDMA (Ecstasy)Ur Screen: NOT DETECTED
METHADONE SCREEN, URINE: NOT DETECTED
OPIATE, UR SCREEN: NOT DETECTED
Phencyclidine (PCP) Ur S: NOT DETECTED
Tricyclic, Ur Screen: NOT DETECTED

## 2015-09-23 LAB — CBC WITH DIFFERENTIAL/PLATELET
BASOS ABS: 0 10*3/uL (ref 0–0.1)
Basophils Relative: 0 %
Eosinophils Absolute: 0.1 10*3/uL (ref 0–0.7)
Eosinophils Relative: 2 %
HEMATOCRIT: 40.3 % (ref 40.0–52.0)
HEMOGLOBIN: 13.1 g/dL (ref 13.0–18.0)
LYMPHS PCT: 23 %
Lymphs Abs: 1.9 10*3/uL (ref 1.0–3.6)
MCH: 29.7 pg (ref 26.0–34.0)
MCHC: 32.5 g/dL (ref 32.0–36.0)
MCV: 91.2 fL (ref 80.0–100.0)
MONO ABS: 0.8 10*3/uL (ref 0.2–1.0)
MONOS PCT: 10 %
NEUTROS ABS: 5.3 10*3/uL (ref 1.4–6.5)
NEUTROS PCT: 65 %
Platelets: 230 10*3/uL (ref 150–440)
RBC: 4.43 MIL/uL (ref 4.40–5.90)
RDW: 14.7 % — AB (ref 11.5–14.5)
WBC: 8.3 10*3/uL (ref 3.8–10.6)

## 2015-09-23 LAB — URINALYSIS COMPLETE WITH MICROSCOPIC (ARMC ONLY)
BILIRUBIN URINE: NEGATIVE
Bacteria, UA: NONE SEEN
Glucose, UA: NEGATIVE mg/dL
Hgb urine dipstick: NEGATIVE
KETONES UR: NEGATIVE mg/dL
LEUKOCYTES UA: NEGATIVE
NITRITE: NEGATIVE
PH: 6 (ref 5.0–8.0)
PROTEIN: NEGATIVE mg/dL
RBC / HPF: NONE SEEN RBC/hpf (ref 0–5)
SPECIFIC GRAVITY, URINE: 1.003 — AB (ref 1.005–1.030)
Squamous Epithelial / LPF: NONE SEEN

## 2015-09-23 LAB — TROPONIN I: Troponin I: <0.03 ng/mL (ref <0.031)

## 2015-09-23 LAB — MAGNESIUM: MAGNESIUM: 1.9 mg/dL (ref 1.7–2.4)

## 2015-09-23 LAB — LIPASE, BLOOD: LIPASE: 21 U/L (ref 11–51)

## 2015-09-23 LAB — ETHANOL: Alcohol, Ethyl (B): 6 mg/dL — ABNORMAL HIGH (ref <5)

## 2015-09-23 LAB — BRAIN NATRIURETIC PEPTIDE: B Natriuretic Peptide: 143 pg/mL — ABNORMAL HIGH (ref 0.0–100.0)

## 2015-09-23 MED ORDER — SODIUM CHLORIDE 0.9 % IV BOLUS (SEPSIS)
500.0000 mL | INTRAVENOUS | Status: AC
  Administered 2015-09-23: 500 mL via INTRAVENOUS

## 2015-09-23 NOTE — Discharge Instructions (Signed)
You have been seen today in the Emergency Department (ED)  for syncope (passing out).  Your workup including labs and EKG show reassuring results.  Your symptoms may be due to dehydration, so it is important that you drink plenty of non-alcoholic fluids.  Please call your regular doctor as soon as possible to schedule the next available clinic appointment to follow up with him/her regarding your visit to the ED and your symptoms.  Return to the Emergency Department (ED)  if you have any further syncopal episodes (pass out again) or develop ANY chest pain, pressure, tightness, trouble breathing, sudden sweating, or other symptoms that concern you.  Please also note that your CT scan showed a nodule in your lungs.  This may be benign, but the radiologist recommend that you have a repeat CT scan performed as an outpatient in about 3 months to see if it has resolved or if it is getting bigger.  Syncope Syncope is a medical term for fainting or passing out. This means you lose consciousness and drop to the ground. People are generally unconscious for less than 5 minutes. You may have some muscle twitches for up to 15 seconds before waking up and returning to normal. Syncope occurs more often in older adults, but it can happen to anyone. While most causes of syncope are not dangerous, syncope can be a sign of a serious medical problem. It is important to seek medical care.  CAUSES  Syncope is caused by a sudden drop in blood flow to the brain. The specific cause is often not determined. Factors that can bring on syncope include:  Taking medicines that lower blood pressure.  Sudden changes in posture, such as standing up quickly.  Taking more medicine than prescribed.  Standing in one place for too long.  Seizure disorders.  Dehydration and excessive exposure to heat.  Low blood sugar (hypoglycemia).  Straining to have a bowel movement.  Heart disease, irregular heartbeat, or other circulatory  problems.  Fear, emotional distress, seeing blood, or severe pain. SYMPTOMS  Right before fainting, you may:  Feel dizzy or light-headed.  Feel nauseous.  See all white or all black in your field of vision.  Have cold, clammy skin. DIAGNOSIS  Your health care provider will ask about your symptoms, perform a physical exam, and perform an electrocardiogram (ECG) to record the electrical activity of your heart. Your health care provider may also perform other heart or blood tests to determine the cause of your syncope which may include:  Transthoracic echocardiogram (TTE). During echocardiography, sound waves are used to evaluate how blood flows through your heart.  Transesophageal echocardiogram (TEE).  Cardiac monitoring. This allows your health care provider to monitor your heart rate and rhythm in real time.  Holter monitor. This is a portable device that records your heartbeat and can help diagnose heart arrhythmias. It allows your health care provider to track your heart activity for several days, if needed.  Stress tests by exercise or by giving medicine that makes the heart beat faster. TREATMENT  In most cases, no treatment is needed. Depending on the cause of your syncope, your health care provider may recommend changing or stopping some of your medicines. HOME CARE INSTRUCTIONS  Have someone stay with you until you feel stable.  Do not drive, use machinery, or play sports until your health care provider says it is okay.  Keep all follow-up appointments as directed by your health care provider.  Lie down right away if you  start feeling like you might faint. Breathe deeply and steadily. Wait until all the symptoms have passed.  Drink enough fluids to keep your urine clear or pale yellow.  If you are taking blood pressure or heart medicine, get up slowly and take several minutes to sit and then stand. This can reduce dizziness. SEEK IMMEDIATE MEDICAL CARE IF:   You have  a severe headache.  You have unusual pain in the chest, abdomen, or back.  You are bleeding from your mouth or rectum, or you have black or tarry stool.  You have an irregular or very fast heartbeat.  You have pain with breathing.  You have repeated fainting or seizure-like jerking during an episode.  You faint when sitting or lying down.  You have confusion.  You have trouble walking.  You have severe weakness.  You have vision problems. If you fainted, call your local emergency services (911 in [place] Do not drive yourself to the hospital.    This information is not intended to replace advice given to you by your health care provider. Make sure you discuss any questions you have with your health care provider.   Document Released: 10/08/2005 Document Revised: 02/22/2015 Document Reviewed: 12/07/2011 Elsevier Interactive Patient Education [employer].
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
## 2015-09-23 NOTE — ED Notes (Signed)
Pt to ED via [facility] from home c/o fall and near syncope.  Pt states started falling several days ago.  Pt states was going to open bedroom door tonight when he was then falling backwards and hit head on edge of couch and shoulder.  Pt doesn't remember falling or if LOC.  Pt is A&Ox4 upon arrival, speaking in complete and coherent sentences and in NAD at this time.

## 2015-09-23 NOTE — ED Notes (Signed)
Patient transported to CT 

## 2015-09-24 ENCOUNTER — Encounter: Payer: Self-pay | Admitting: Emergency Medicine

## 2015-09-24 ENCOUNTER — Inpatient Hospital Stay
Admission: EM | Admit: 2015-09-24 | Discharge: 2015-09-27 | DRG: 442 | Disposition: A | Discharge status: Skilled Nursing Facility | Payer: Medicaid Other | Attending: Internal Medicine | Admitting: Internal Medicine

## 2015-09-24 DIAGNOSIS — I10 Essential (primary) hypertension: Secondary | ICD-10-CM | POA: Diagnosis present

## 2015-09-24 DIAGNOSIS — F329 Major depressive disorder, single episode, unspecified: Secondary | ICD-10-CM | POA: Diagnosis present

## 2015-09-24 DIAGNOSIS — Z9981 Dependence on supplemental oxygen: Secondary | ICD-10-CM

## 2015-09-24 DIAGNOSIS — Z681 Body mass index (BMI) 19 or less, adult: Secondary | ICD-10-CM

## 2015-09-24 DIAGNOSIS — I739 Peripheral vascular disease, unspecified: Secondary | ICD-10-CM | POA: Diagnosis present

## 2015-09-24 DIAGNOSIS — H9190 Unspecified hearing loss, unspecified ear: Secondary | ICD-10-CM | POA: Diagnosis present

## 2015-09-24 DIAGNOSIS — Z886 Allergy status to analgesic agent status: Secondary | ICD-10-CM

## 2015-09-24 DIAGNOSIS — J9610 Chronic respiratory failure, unspecified whether with hypoxia or hypercapnia: Secondary | ICD-10-CM | POA: Diagnosis present

## 2015-09-24 DIAGNOSIS — G9340 Encephalopathy, unspecified: Secondary | ICD-10-CM | POA: Diagnosis present

## 2015-09-24 DIAGNOSIS — C61 Malignant neoplasm of prostate: Secondary | ICD-10-CM | POA: Diagnosis present

## 2015-09-24 DIAGNOSIS — E871 Hypo-osmolality and hyponatremia: Secondary | ICD-10-CM | POA: Diagnosis present

## 2015-09-24 DIAGNOSIS — E7220 Disorder of urea cycle metabolism, unspecified: Secondary | ICD-10-CM | POA: Diagnosis present

## 2015-09-24 DIAGNOSIS — I2510 Atherosclerotic heart disease of native coronary artery without angina pectoris: Secondary | ICD-10-CM | POA: Diagnosis present

## 2015-09-24 DIAGNOSIS — Z8701 Personal history of pneumonia (recurrent): Secondary | ICD-10-CM

## 2015-09-24 DIAGNOSIS — K7682 Hepatic encephalopathy: Secondary | ICD-10-CM

## 2015-09-24 DIAGNOSIS — J449 Chronic obstructive pulmonary disease, unspecified: Secondary | ICD-10-CM | POA: Diagnosis present

## 2015-09-24 DIAGNOSIS — F1027 Alcohol dependence with alcohol-induced persisting dementia: Secondary | ICD-10-CM | POA: Diagnosis present

## 2015-09-24 DIAGNOSIS — Z933 Colostomy status: Secondary | ICD-10-CM

## 2015-09-24 DIAGNOSIS — K7290 Hepatic failure, unspecified without coma: Secondary | ICD-10-CM

## 2015-09-24 DIAGNOSIS — Z9889 Other specified postprocedural states: Secondary | ICD-10-CM

## 2015-09-24 DIAGNOSIS — E46 Unspecified protein-calorie malnutrition: Secondary | ICD-10-CM | POA: Diagnosis present

## 2015-09-24 DIAGNOSIS — Z9049 Acquired absence of other specified parts of digestive tract: Secondary | ICD-10-CM

## 2015-09-24 DIAGNOSIS — R296 Repeated falls: Secondary | ICD-10-CM | POA: Diagnosis present

## 2015-09-24 LAB — CBC WITH DIFFERENTIAL/PLATELET
BASOS ABS: 0.1 10*3/uL (ref 0–0.1)
Basophils Relative: 1 %
EOS ABS: 0.2 10*3/uL (ref 0–0.7)
EOS PCT: 2 %
HCT: 37.6 % — ABNORMAL LOW (ref 40.0–52.0)
Hemoglobin: 12 g/dL — ABNORMAL LOW (ref 13.0–18.0)
LYMPHS ABS: 2.2 10*3/uL (ref 1.0–3.6)
Lymphocytes Relative: 26 %
MCH: 29 pg (ref 26.0–34.0)
MCHC: 31.9 g/dL — ABNORMAL LOW (ref 32.0–36.0)
MCV: 91.1 fL (ref 80.0–100.0)
Monocytes Absolute: 1 10*3/uL (ref 0.2–1.0)
Monocytes Relative: 12 %
NEUTROS ABS: 5 10*3/uL (ref 1.4–6.5)
Neutrophils Relative %: 59 %
PLATELETS: 216 10*3/uL (ref 150–440)
RBC: 4.13 MIL/uL — AB (ref 4.40–5.90)
RDW: 14.7 % — AB (ref 11.5–14.5)
WBC: 8.5 10*3/uL (ref 3.8–10.6)

## 2015-09-24 LAB — SALICYLATE LEVEL

## 2015-09-24 LAB — COMPREHENSIVE METABOLIC PANEL
ALT: 8 U/L — AB (ref 17–63)
AST: 15 U/L (ref 15–41)
Albumin: 3.3 g/dL — ABNORMAL LOW (ref 3.5–5.0)
Alkaline Phosphatase: 64 U/L (ref 38–126)
Anion gap: 4 — ABNORMAL LOW (ref 5–15)
BUN: 7 mg/dL (ref 6–20)
CHLORIDE: 91 mmol/L — AB (ref 101–111)
CO2: 43 mmol/L — AB (ref 22–32)
CREATININE: 0.68 mg/dL (ref 0.61–1.24)
Calcium: 8.6 mg/dL — ABNORMAL LOW (ref 8.9–10.3)
GFR calc Af Amer: >60 mL/min (ref >60)
GFR calc non Af Amer: >60 mL/min (ref >60)
Glucose, Bld: 128 mg/dL — ABNORMAL HIGH (ref 65–99)
POTASSIUM: 4.3 mmol/L (ref 3.5–5.1)
SODIUM: 138 mmol/L (ref 135–145)
Total Bilirubin: <0.1 mg/dL — ABNORMAL LOW (ref 0.3–1.2)
Total Protein: 6.3 g/dL — ABNORMAL LOW (ref 6.5–8.1)

## 2015-09-24 LAB — AMMONIA: AMMONIA: 68 umol/L — AB (ref 9–35)

## 2015-09-24 LAB — ACETAMINOPHEN LEVEL

## 2015-09-24 LAB — TROPONIN I

## 2015-09-24 LAB — ETHANOL

## 2015-09-24 LAB — TSH: TSH: 0.968 u[IU]/mL (ref 0.350–4.500)

## 2015-09-24 LAB — LIPASE, BLOOD: Lipase: 31 U/L (ref 11–51)

## 2015-09-24 MED ORDER — LACTULOSE 10 GM/15ML PO SOLN
20.0000 g | Freq: Once | ORAL | Status: DC
Start: 2015-09-24 — End: 2015-09-27

## 2015-09-24 MED ORDER — IPRATROPIUM-ALBUTEROL 0.5-2.5 (3) MG/3ML IN SOLN
3.0000 mL | Freq: Once | RESPIRATORY_TRACT | Status: AC
  Administered 2015-09-24: 3 mL via RESPIRATORY_TRACT
  Filled 2015-09-24: qty 3

## 2015-09-24 NOTE — ED Notes (Signed)
Pt states he has no way home. States son will pick him up in the morning charge RN made aware pt is oxygen dependant

## 2015-09-24 NOTE — ED Notes (Signed)
[facility] Dept called ED to say they have made contact with pt's son TJ Shanker and informed him that pt is up for discharge and needs a ride.
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
## 2015-09-24 NOTE — BH Assessment (Signed)
Assessment Note  Zachary Walls is an 59 y.o. male. Pt denies SI, HI and self-injurious behaviors. Pt reports no hx of suicide attempts. Pt reports no hallucinations. Pt stated that he used to consume 1 beer/day however, has not consumed alcohol in approx. 3 months. Writer completed a [place] check which revealed an upcoming court date (09/27/15) for driving while impaired and failure to maintain lane control. Pt denies depression and any MH diagnosis. Pt complaining of medical concerns in his "stomach area". Pt began coughing during interview, reiterated his medical concerns and requested to stop interview process. Pt reports difficulty with hearing.  Pt presented as cooperative and oriented. Pt presented with body odor.  The following was obtained from Pt's chart:  Zachary Walls is a 59 y.o. male with a history of COPD as well as alcohol abuse and diverticulitis with colon resection and colostomy was presenting today with multiple complaints. He says that his abdomen is hurting and has a headache as well. He is also complaining that he does not have an answer for his continuing episodes of syncope. He was seen earlier today and had an extensive workup which did not reveal any pathology in need of admission. The patient returned back soon after being discharged with multiple other complaints. There is also the question of a social issue because the patient does not have anyone to take care of at home.   Diagnosis:   Past Medical History:  Past Medical History  Diagnosis Date  . COPD (chronic obstructive pulmonary disease) ([place])   . History of pneumonia   . Hypertension     a. Normotensive 08/2013 off meds.  [name] HOH (hard of hearing)   . Protein calorie malnutrition ([place])   . ETOH abuse   . Tobacco abuse   . Diverticulitis     a. s/p colon resection and colostomy  . Cardiac arrest ([place])     a. 01/2013, felt to be 2/2 severe COPD req trach;  b. 01/2013 Echo: EF 65-70% w/o rwma.  . Difficult  intubation   . CAD (coronary artery disease)     a. Nonobstructive CAD by cath 08/2013 (mod RCA, mid LAD myocardial bridge) - ECG was concerning for inf STEMI but pt ruled out. CP possibly GERD.   [name] Hyponatremia     a. During 08/2013 felt due to polydipsia.  . Polysubstance abuse     a. Tobacco, marijuana, alcoholism.  [name] Anxiety   . Shortness of breath   . Peripheral vascular disease ([place])   . Prostate cancer [facility])     Past Surgical History  Procedure Laterality Date  . Hernia repair      inguinal  . Colostomy  12/11/2011    Procedure: COLOSTOMY;  Surgeon: Rolm Bookbinder, MD;  [facility]: [facility];  Service: General;  Laterality: N/A;  . Colostomy revision  12/11/2011    Procedure: COLON RESECTION SIGMOID;  Surgeon: Rolm Bookbinder, MD;  [facility];  Service: General;;  . Bowel resection  12/11/2011    Procedure: SMALL BOWEL RESECTION;  Surgeon: Rolm Bookbinder, MD;  [facility];  Service: General;;  times 2  . Cystoscopy w/ ureteral stent placement  12/11/2011    Procedure: CYSTOSCOPY WITH STENT REPLACEMENT;  Surgeon: Malka So, MD;  [facility];  Service: Urology;  Laterality: Left;  ureteral catheter placement  . Cardiac catheterization  08/25/2013  . Tracheostomy  01/2013    emergent d/t difficult intubation  . Colon surgery    .  Tracheostomy closure    . Left heart catheterization with coronary angiogram N/A 08/25/2013    Procedure: LEFT HEART CATHETERIZATION WITH CORONARY ANGIOGRAM;  Surgeon: Wellington Hampshire, MD;  Location: [place] CATH LAB;  Service: Cardiovascular;  Laterality: N/A;    Family History:  Family History  Problem Relation Age of Onset  . Heart attack Father   . Cancer Father     unknown type  . HIV Brother     Social History:  reports that he has been smoking Cigarettes.  He has a 22 pack-year smoking history. He has never used smokeless tobacco. He reports that he drinks alcohol. He reports that he uses illicit drugs  (Marijuana).  Additional Social History:  Alcohol / Drug Use Pain Medications: None Reproted Prescriptions: None Reported Over the Counter: None Reported History of alcohol / drug use?: Yes Substance #1 Name of Substance 1: Alcohol (ct. states "I used to drink") 1 - Age of First Use: Not Reported 1 - Amount (size/oz): 1 beer 1 - Frequency: Daily 1 - Last Use / Amount: 3 months ago  CIWA: CIWA-Ar BP: 97/63 mmHg Pulse Rate: (!) 157 COWS:    Allergies:  Allergies  Allergen Reactions  . Bee Venom Shortness Of Breath and Swelling  . Shellfish Allergy Anaphylaxis  . Codeine Itching and Rash    Home Medications:  (Not in a hospital admission)  OB/GYN Status:  No LMP for male patient.  General Assessment Data Location of Assessment: [facility] ED TTS Assessment: In system Is this a Tele or Face-to-Face Assessment?: Face-to-Face Is this an Initial Assessment or a Re-assessment for this encounter?: Initial Assessment Marital status:  (UTA) Is patient pregnant?: No Pregnancy Status: No Living Arrangements: Alone Can pt return to current living arrangement?: Yes Admission Status: Voluntary Is patient capable of signing voluntary admission?: No Referral Source: Self/Family/Friend Insurance type: Medicaid  Medical Screening Exam ([place]) Medical Exam completed: Yes  Crisis Care Plan Living Arrangements: Alone Name of Psychiatrist: None  Name of Therapist: None  Education Status Is patient currently in school?: No Current Grade: n Highest grade of school patient has completed: 9th Grade Name of school: [employer] Contact person: NA  Risk to self with the past 6 months Suicidal Ideation: No Has patient been a risk to self within the past 6 months prior to admission? : No Suicidal Intent: No Has patient had any suicidal intent within the past 6 months prior to admission? : No Is patient at risk for suicide?: No Suicidal Plan?: No Has patient had any suicidal plan  within the past 6 months prior to admission? : No Access to Means: No What has been your use of drugs/alcohol within the last 12 months?: 1 beer daily, last use approx. 3 months ago Previous Attempts/Gestures: No How many times?: 0 Other Self Harm Risks: Extensive Medical hx Intentional Self Injurious Behavior: None Family Suicide History: Unable to assess Recent stressful life event(s):  (UTA) Persecutory voices/beliefs?: No Depression: No Substance abuse history and/or treatment for substance abuse?: No Suicide prevention information given to non-admitted patients: Not applicable  Risk to Others within the past 6 months Homicidal Ideation: No Does patient have any lifetime risk of violence toward others beyond the six months prior to admission? : No Thoughts of Harm to Others: No Current Homicidal Intent: No Current Homicidal Plan: No Access to Homicidal Means: No Identified Victim: NA History of harm to others?: No Assessment of Violence: None Noted Violent Behavior Description: NA Does patient have access  to weapons?: Yes (Comment) (Access to knives) Criminal Charges Pending?: No Does patient have a court date: Yes Court Date: 09/29/15 (Driving while impaired) Is patient on probation?: No  Psychosis Hallucinations: None noted Delusions: None noted  Mental Status Report Appearance/Hygiene: Body odor, Disheveled Eye Contact: Good Motor Activity: Unremarkable Speech: Logical/coherent, Tangential Level of Consciousness: Alert Mood: Euthymic Affect: Appropriate to circumstance Anxiety Level: Minimal Thought Processes: Coherent, Relevant Judgement: Unimpaired Orientation: Person, Place, Situation, Appropriate for developmental age Obsessive Compulsive Thoughts/Behaviors: None  Cognitive Functioning Concentration: Fair Memory: Recent Intact, Remote Intact IQ: Average Insight: Fair Impulse Control: Good Appetite:  (UTA) Weight Loss:  (UTA) Weight Gain:   (UTA) Sleep: Unable to Assess Total Hours of Sleep:  (UTA) Vegetative Symptoms: Unable to Assess  ADLScreening [facility] Assessment Services) Patient's cognitive ability adequate to safely complete daily activities?: Yes Patient able to express need for assistance with ADLs?: Yes  Prior Inpatient Therapy Prior Inpatient Therapy: No  Prior Outpatient Therapy Prior Outpatient Therapy: No Does patient have an ACCT team?: Unknown Does patient have Intensive In-House Services?  : Unknown Does patient have [facility] services? : Unknown Does patient have P4CC services?: Unknown  ADL Screening (condition at time of admission) Patient's cognitive ability adequate to safely complete daily activities?: Yes Is the patient deaf or have difficulty hearing?: Yes Does the patient have difficulty concentrating, remembering, or making decisions?: Yes Patient able to express need for assistance with ADLs?: Yes Does the patient have difficulty dressing or bathing?: Yes Does the patient have difficulty walking or climbing stairs?: No Weakness of Legs: None Weakness of Arms/Hands: None  Home Assistive Devices/Equipment Home Assistive Devices/Equipment: Other (Comment) (Colostomy)  Therapy Consults (therapy consults require a physician order) PT Evaluation Needed: No OT Evalulation Needed: No SLP Evaluation Needed: No Abuse/Neglect Assessment (Assessment to be complete while patient is alone) Physical Abuse: Denies Verbal Abuse: Denies Sexual Abuse: Denies Exploitation of patient/patient's resources: Denies Self-Neglect: Denies Values / Beliefs Cultural Requests During Hospitalization: None Spiritual Requests During Hospitalization: None Consults Spiritual Care Consult Needed: No Social Work Consult Needed: Yes (Comment) Advance Directives (For Healthcare) Does patient have an advance directive?: No Would patient like information on creating an advanced directive?: No - patient declined  information    Additional Information 1:1 In Past 12 Months?: No CIRT Risk: No Elopement Risk: No Does patient have medical clearance?: No     Disposition:  Disposition Initial Assessment Completed for this Encounter: Yes Disposition of Patient: Other dispositions (Psych MD Consult)  On Site Evaluation by:   Reviewed with Physician:    Dawnyel Leven J [place] 09/25/2015 12:23 AM

## 2015-09-24 NOTE — ED Notes (Signed)
Reached pt's son on cell phone. Son stated he is at work right now and it will be around an hour before he will be here to pick up patient. Stated he will be able to go by pt's home and retrieve his oxygen tank.

## 2015-09-24 NOTE — ED Notes (Signed)
Attempted to call [place] patients son, no answer on cell or home phone
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
## 2015-09-24 NOTE — ED Notes (Signed)
Daughter in law refusing to come pick up the pt. States we should keep him since he called the ems 2x in one day. Explained that pt's cant be kept in the hospital just because they call the ems. She asked why he came to get him - explained ems has to bring them if he calls and that if she doesn't want him to call the ems then she needed to have a conversation with him. Pt was angry and rambling how [facility] "always does people wrong". Stopped her and just reminded her that the pt needed a ride home. Daughter in law called me "rude". I again asked her to come and give the pt a ride and she hung up.

## 2015-09-24 NOTE — ED Notes (Signed)
Pt. Here via EMS for multiple chronic conditions.  Pt. Was discharged for ED today.  Pt. Smells strongly of urine and is disheveled.

## 2015-09-24 NOTE — ED Notes (Signed)
Pt's son given care summary with pt's permission. Pt's son stated pt has had multiple, frequent hospital visits and is noncompliant at home with oxygen and medications. Also stated he believes pt may be taking prescription drugs from his neighbors.

## 2015-09-24 NOTE — ED Notes (Signed)
Pt's son arrived to transport pt back to home.

## 2015-09-24 NOTE — ED Notes (Signed)
Pt stable at discharge, assisted into son's car.

## 2015-09-24 NOTE — ED Notes (Signed)
Daughter in law called asking why pt. Was back here when he was discharged today.  Told her we were still accessing pt. Daughter in law gave home [phone] and cell # [phone], stated she would call back later.

## 2015-09-24 NOTE — ED Notes (Signed)
Pt given sandwich box - stated his stomach was hurting from not eating today. Pt seen earlier for abd pain and discharged. Asked pt why he called ems again and he stated he wanted to stay overnight so we could give him medicine for a headache. Asked if he took any tylenol before calling ems and he states he didn't have any. Once pt had his food he asked for a phone to call his son to pick him up.

## 2015-09-24 NOTE — ED Notes (Signed)
Unable to reach son to follow-up at this time. Will continue to attempt contact.

## 2015-09-25 DIAGNOSIS — Z9981 Dependence on supplemental oxygen: Secondary | ICD-10-CM | POA: Diagnosis not present

## 2015-09-25 DIAGNOSIS — F1020 Alcohol dependence, uncomplicated: Secondary | ICD-10-CM

## 2015-09-25 DIAGNOSIS — F329 Major depressive disorder, single episode, unspecified: Secondary | ICD-10-CM | POA: Diagnosis present

## 2015-09-25 DIAGNOSIS — K7290 Hepatic failure, unspecified without coma: Secondary | ICD-10-CM | POA: Diagnosis present

## 2015-09-25 DIAGNOSIS — E46 Unspecified protein-calorie malnutrition: Secondary | ICD-10-CM | POA: Diagnosis present

## 2015-09-25 DIAGNOSIS — R296 Repeated falls: Secondary | ICD-10-CM | POA: Diagnosis present

## 2015-09-25 DIAGNOSIS — C61 Malignant neoplasm of prostate: Secondary | ICD-10-CM | POA: Diagnosis present

## 2015-09-25 DIAGNOSIS — E871 Hypo-osmolality and hyponatremia: Secondary | ICD-10-CM | POA: Diagnosis present

## 2015-09-25 DIAGNOSIS — H9190 Unspecified hearing loss, unspecified ear: Secondary | ICD-10-CM | POA: Diagnosis present

## 2015-09-25 DIAGNOSIS — J449 Chronic obstructive pulmonary disease, unspecified: Secondary | ICD-10-CM | POA: Diagnosis present

## 2015-09-25 DIAGNOSIS — J9610 Chronic respiratory failure, unspecified whether with hypoxia or hypercapnia: Secondary | ICD-10-CM | POA: Diagnosis present

## 2015-09-25 DIAGNOSIS — Z886 Allergy status to analgesic agent status: Secondary | ICD-10-CM | POA: Diagnosis not present

## 2015-09-25 DIAGNOSIS — I10 Essential (primary) hypertension: Secondary | ICD-10-CM | POA: Diagnosis present

## 2015-09-25 DIAGNOSIS — Z933 Colostomy status: Secondary | ICD-10-CM | POA: Diagnosis not present

## 2015-09-25 DIAGNOSIS — Z9049 Acquired absence of other specified parts of digestive tract: Secondary | ICD-10-CM | POA: Diagnosis not present

## 2015-09-25 DIAGNOSIS — Z9889 Other specified postprocedural states: Secondary | ICD-10-CM | POA: Diagnosis not present

## 2015-09-25 DIAGNOSIS — F1027 Alcohol dependence with alcohol-induced persisting dementia: Secondary | ICD-10-CM | POA: Diagnosis present

## 2015-09-25 DIAGNOSIS — E7220 Disorder of urea cycle metabolism, unspecified: Secondary | ICD-10-CM | POA: Diagnosis present

## 2015-09-25 DIAGNOSIS — Z681 Body mass index (BMI) 19 or less, adult: Secondary | ICD-10-CM | POA: Diagnosis not present

## 2015-09-25 DIAGNOSIS — F1097 Alcohol use, unspecified with alcohol-induced persisting dementia: Secondary | ICD-10-CM | POA: Diagnosis not present

## 2015-09-25 DIAGNOSIS — I739 Peripheral vascular disease, unspecified: Secondary | ICD-10-CM | POA: Diagnosis present

## 2015-09-25 DIAGNOSIS — I2510 Atherosclerotic heart disease of native coronary artery without angina pectoris: Secondary | ICD-10-CM | POA: Diagnosis present

## 2015-09-25 DIAGNOSIS — Z8701 Personal history of pneumonia (recurrent): Secondary | ICD-10-CM | POA: Diagnosis not present

## 2015-09-25 LAB — HEMOGLOBIN A1C: Hgb A1c MFr Bld: 5.1 % (ref 4.0–6.0)

## 2015-09-25 MED ORDER — DIAZEPAM 5 MG PO TABS
5.0000 mg | ORAL_TABLET | Freq: Three times a day (TID) | ORAL | Status: DC | PRN
  Administered 2015-09-25 – 2015-09-27 (×4): 5 mg via ORAL
  Filled 2015-09-25 (×4): qty 1

## 2015-09-25 MED ORDER — PREDNISONE 20 MG PO TABS: 20.0000 mg | ORAL_TABLET | Freq: Once | ORAL | Status: DC

## 2015-09-25 MED ORDER — NICOTINE 14 MG/24HR TD PT24
14.0000 mg | MEDICATED_PATCH | Freq: Every day | TRANSDERMAL | Status: DC
  Administered 2015-09-25 – 2015-09-27 (×3): 14 mg via TRANSDERMAL
  Filled 2015-09-25 (×3): qty 1

## 2015-09-25 MED ORDER — ONDANSETRON HCL 4 MG PO TABS: 4.0000 mg | ORAL_TABLET | Freq: Four times a day (QID) | ORAL | Status: DC | PRN

## 2015-09-25 MED ORDER — FOLIC ACID 1 MG PO TABS
1.0000 mg | ORAL_TABLET | Freq: Every day | ORAL | Status: DC
  Administered 2015-09-25 – 2015-09-27 (×3): 1 mg via ORAL
  Filled 2015-09-25 (×3): qty 1

## 2015-09-25 MED ORDER — MORPHINE SULFATE (PF) 2 MG/ML IV SOLN: 2.0000 mg | INTRAVENOUS | Status: DC | PRN

## 2015-09-25 MED ORDER — ATORVASTATIN CALCIUM 20 MG PO TABS
20.0000 mg | ORAL_TABLET | Freq: Every evening | ORAL | Status: DC
Start: 2015-09-25 — End: 2015-09-27
  Administered 2015-09-25 – 2015-09-26 (×2): 20 mg via ORAL
  Filled 2015-09-25 (×2): qty 1

## 2015-09-25 MED ORDER — ALBUTEROL SULFATE (2.5 MG/3ML) 0.083% IN NEBU
2.5000 mg | INHALATION_SOLUTION | RESPIRATORY_TRACT | Status: DC | PRN
  Administered 2015-09-27: 2.5 mg via RESPIRATORY_TRACT
  Filled 2015-09-25: qty 3

## 2015-09-25 MED ORDER — RISPERIDONE 0.5 MG PO TABS
0.5000 mg | ORAL_TABLET | Freq: Two times a day (BID) | ORAL | Status: DC
  Administered 2015-09-25 – 2015-09-27 (×6): 0.5 mg via ORAL
  Filled 2015-09-25 (×6): qty 1

## 2015-09-25 MED ORDER — DIPHENHYDRAMINE HCL 50 MG/ML IJ SOLN: 12.5000 mg | Freq: Four times a day (QID) | INTRAMUSCULAR | Status: DC | PRN

## 2015-09-25 MED ORDER — PREDNISONE 20 MG PO TABS
40.0000 mg | ORAL_TABLET | Freq: Once | ORAL | Status: AC
  Administered 2015-09-25: 40 mg via ORAL
  Filled 2015-09-25: qty 2

## 2015-09-25 MED ORDER — PREDNISONE 5 MG PO TABS: 10.0000 mg | ORAL_TABLET | Freq: Once | ORAL | Status: DC

## 2015-09-25 MED ORDER — QUETIAPINE FUMARATE 25 MG PO TABS
50.0000 mg | ORAL_TABLET | Freq: Every morning | ORAL | Status: DC
  Administered 2015-09-25 – 2015-09-27 (×3): 50 mg via ORAL
  Filled 2015-09-25 (×3): qty 2

## 2015-09-25 MED ORDER — VITAMIN B-1 100 MG PO TABS
100.0000 mg | ORAL_TABLET | Freq: Every day | ORAL | Status: DC
  Administered 2015-09-25 – 2015-09-27 (×3): 100 mg via ORAL
  Filled 2015-09-25 (×3): qty 1

## 2015-09-25 MED ORDER — ALBUTEROL SULFATE (2.5 MG/3ML) 0.083% IN NEBU
INHALATION_SOLUTION | RESPIRATORY_TRACT | Status: AC
Start: 2015-09-25 — End: 2015-09-25
  Filled 2015-09-25: qty 3

## 2015-09-25 MED ORDER — INFLUENZA VAC SPLIT QUAD 0.5 ML IM SUSY
0.5000 mL | PREFILLED_SYRINGE | INTRAMUSCULAR | Status: AC
  Administered 2015-09-26: 0.5 mL via INTRAMUSCULAR
  Filled 2015-09-25: qty 0.5

## 2015-09-25 MED ORDER — PANTOPRAZOLE SODIUM 40 MG PO TBEC
40.0000 mg | DELAYED_RELEASE_TABLET | Freq: Every day | ORAL | Status: DC
  Administered 2015-09-25 – 2015-09-27 (×3): 40 mg via ORAL
  Filled 2015-09-25 (×3): qty 1

## 2015-09-25 MED ORDER — HEPARIN SODIUM (PORCINE) 5000 UNIT/ML IJ SOLN
5000.0000 [IU] | Freq: Three times a day (TID) | INTRAMUSCULAR | Status: DC
  Administered 2015-09-25 – 2015-09-27 (×6): 5000 [IU] via SUBCUTANEOUS
  Filled 2015-09-25 (×6): qty 1

## 2015-09-25 MED ORDER — METOPROLOL TARTRATE 25 MG PO TABS
25.0000 mg | ORAL_TABLET | Freq: Two times a day (BID) | ORAL | Status: DC
  Administered 2015-09-27: 25 mg via ORAL
  Filled 2015-09-25 (×5): qty 1

## 2015-09-25 MED ORDER — PNEUMOCOCCAL VAC POLYVALENT 25 MCG/0.5ML IJ INJ
0.5000 mL | INJECTION | INTRAMUSCULAR | Status: AC
  Administered 2015-09-26: 0.5 mL via INTRAMUSCULAR
  Filled 2015-09-25: qty 0.5

## 2015-09-25 MED ORDER — SODIUM CHLORIDE 0.9 % IV SOLN
INTRAVENOUS | Status: DC
  Administered 2015-09-25 – 2015-09-27 (×5): via INTRAVENOUS

## 2015-09-25 MED ORDER — IPRATROPIUM-ALBUTEROL 0.5-2.5 (3) MG/3ML IN SOLN
3.0000 mL | RESPIRATORY_TRACT | Status: DC | PRN
  Administered 2015-09-26: 3 mL via RESPIRATORY_TRACT
  Filled 2015-09-25: qty 3

## 2015-09-25 MED ORDER — IPRATROPIUM-ALBUTEROL 18-103 MCG/ACT IN AERO: 2.0000 | INHALATION_SPRAY | RESPIRATORY_TRACT | Status: DC | PRN

## 2015-09-25 MED ORDER — CYANOCOBALAMIN 500 MCG PO TABS
500.0000 ug | ORAL_TABLET | Freq: Every day | ORAL | Status: DC
  Administered 2015-09-25 – 2015-09-27 (×3): 500 ug via ORAL
  Filled 2015-09-25 (×4): qty 1

## 2015-09-25 MED ORDER — PREDNISONE 5 MG PO TABS: 5.0000 mg | ORAL_TABLET | Freq: Once | ORAL | Status: DC

## 2015-09-25 MED ORDER — SUCRALFATE 1 GM/10ML PO SUSP
1.0000 g | Freq: Three times a day (TID) | ORAL | Status: DC
  Administered 2015-09-25 – 2015-09-27 (×10): 1 g via ORAL
  Filled 2015-09-25 (×10): qty 10

## 2015-09-25 MED ORDER — NITROGLYCERIN 0.4 MG SL SUBL: 0.4000 mg | SUBLINGUAL_TABLET | SUBLINGUAL | Status: DC | PRN

## 2015-09-25 MED ORDER — LACTULOSE 10 GM/15ML PO SOLN
30.0000 g | Freq: Two times a day (BID) | ORAL | Status: DC
  Administered 2015-09-25 – 2015-09-27 (×6): 30 g via ORAL
  Filled 2015-09-25 (×6): qty 60

## 2015-09-25 MED ORDER — ESCITALOPRAM OXALATE 10 MG PO TABS
10.0000 mg | ORAL_TABLET | Freq: Every day | ORAL | Status: DC
  Administered 2015-09-25 – 2015-09-27 (×3): 10 mg via ORAL
  Filled 2015-09-25 (×3): qty 1

## 2015-09-25 MED ORDER — ONDANSETRON HCL 4 MG/2ML IJ SOLN: 4.0000 mg | Freq: Four times a day (QID) | INTRAMUSCULAR | Status: DC | PRN

## 2015-09-25 MED ORDER — GUAIFENESIN ER 600 MG PO TB12
600.0000 mg | ORAL_TABLET | Freq: Two times a day (BID) | ORAL | Status: DC
Start: 2015-09-25 — End: 2015-09-27
  Administered 2015-09-25 – 2015-09-27 (×6): 600 mg via ORAL
  Filled 2015-09-25 (×6): qty 1

## 2015-09-25 MED ORDER — PREDNISONE 5 MG PO TABS
30.0000 mg | ORAL_TABLET | Freq: Once | ORAL | Status: AC
  Administered 2015-09-26: 30 mg via ORAL
  Filled 2015-09-25: qty 2

## 2015-09-25 MED ORDER — LOSARTAN POTASSIUM 50 MG PO TABS
50.0000 mg | ORAL_TABLET | Freq: Every day | ORAL | Status: DC
  Filled 2015-09-25: qty 1

## 2015-09-25 MED ORDER — ALBUTEROL SULFATE (2.5 MG/3ML) 0.083% IN NEBU: 2.5000 mg | INHALATION_SOLUTION | RESPIRATORY_TRACT | Status: DC | PRN

## 2015-09-25 MED ORDER — PREDNISONE 50 MG PO TABS
50.0000 mg | ORAL_TABLET | Freq: Once | ORAL | Status: AC
  Administered 2015-09-25: 50 mg via ORAL
  Filled 2015-09-25: qty 1

## 2015-09-25 MED ORDER — TIOTROPIUM BROMIDE MONOHYDRATE 18 MCG IN CAPS
18.0000 ug | ORAL_CAPSULE | Freq: Every day | RESPIRATORY_TRACT | Status: DC
  Administered 2015-09-25 – 2015-09-27 (×3): 18 ug via RESPIRATORY_TRACT
  Filled 2015-09-25: qty 5

## 2015-09-25 MED ORDER — DOCUSATE SODIUM 100 MG PO CAPS
100.0000 mg | ORAL_CAPSULE | Freq: Two times a day (BID) | ORAL | Status: DC
  Administered 2015-09-25 – 2015-09-27 (×6): 100 mg via ORAL
  Filled 2015-09-25 (×6): qty 1

## 2015-09-25 NOTE — ED Notes (Signed)
Report received on pt. Care assumed. Pt awaiting admission.

## 2015-09-25 NOTE — BHH Counselor (Signed)
Writer unsuccessfully attempted to contact on call Psych MD to run Pt.

## 2015-09-25 NOTE — Evaluation (Signed)
Physical Therapy Evaluation Patient Details Name: Zachary Walls MRN: [id number] DOB: 01-10-1956 Today's Date: 09/25/2015   History of Present Illness  Pt has been having increasing falls and weakness recently.  Here with encephalopathy after fall, hitting head.  Clinical Impression  Pt is able to participate with PT, but has drop in O2 to low 80s with minimal exertion (on 2 liters).  He is able to ambulate minimally with walker but remains impulsive t/o session though he does appear grossly safe t/o activities.  He has considerable LE tremoring with AROM but is independent with bed mobiltiy and transfers.     Follow Up Recommendations Home health PT    Equipment Recommendations       Recommendations for Other Services       Precautions / Restrictions Precautions Precautions: Fall Precaution Comments:  (2 liters [place]) Restrictions Weight Bearing Restrictions: No      Mobility  Bed Mobility Overal bed mobility: Modified Independent             General bed mobility comments: Pt able to slowly, but safetly get to EOB - needs much encouragement  Transfers Overall transfer level: Modified independent Equipment used: Rolling walker (2 wheeled)             General transfer comment: Pt able to rise to standing w/o direct assist, does appear to rely on the walker signficantly to maitnain balance  Ambulation/Gait Ambulation/Gait assistance: Min guard Ambulation Distance (Feet): 30 Feet Assistive device: Rolling walker (2 wheeled)       General Gait Details: Pt with slow, hesitant but relatively safe/steady ambulation.  He fatigues quickly with the effort dropping to low 80s even on 2 liters O2.    Stairs            Wheelchair Mobility    Modified Rankin (Stroke Patients Only)       Balance                                             Pertinent Vitals/Pain Pain Assessment: No/denies pain    Home Living Family/patient expects to be  discharged to:: Private residence Living Arrangements: Alone Available Help at Discharge: Family;Friend(s);Available PRN/intermittently Type of Home: Mobile home Home Access: Stairs to enter Entrance Stairs-Rails: Right;Left;Can reach both Entrance Stairs-Number of Steps: 3-4 Home Layout: One level Home Equipment: Walker - 2 wheels Additional Comments: pt's son lives down the street and helps as needed    Prior Function Level of Independence: Independent         Comments: hx of multiple falls in past year.  Has hearing aid for R ear.     Hand Dominance   Dominant Hand: Right    Extremity/Trunk Assessment   Upper Extremity Assessment: Generalized weakness (pt with limited elevation and grossly 3+ to 4-/5 strength) RUE Deficits / Details: Limited AROM to 90 degrees shoulder flexion due to pt reporting he broke his shoulder in past and this is not new.  Full AROM in R hand and wrist but overall has reduced muscle mass, frail and weak BUEs and hands.         Lower Extremity Assessment: Generalized weakness;Overall [facility] for tasks assessed;Difficult to assess due to impaired cognition (pt with R>L LE tremoring with resisted acts, poor control)         Communication   Communication: HOH  Cognition Arousal/Alertness: Awake/alert  Behavior During Therapy: Restless;Impulsive Overall Cognitive Status: Difficult to assess                      General Comments      Exercises        Assessment/Plan    PT Assessment Patient needs continued PT services  PT Diagnosis Difficulty walking;Generalized weakness   PT Problem List Decreased strength;Decreased range of motion;Decreased activity tolerance;Decreased balance;Decreased mobility;Decreased coordination;Decreased cognition;Decreased knowledge of use of DME;Decreased safety awareness  PT Treatment Interventions Gait training;DME instruction;Stair training;Functional mobility training;Therapeutic activities;Therapeutic  exercise;Balance training;Cognitive remediation   PT Goals (Current goals can be found in the Care Plan section) Acute Rehab PT Goals Patient Stated Goal: "I gotta stop this falling" PT Goal Formulation: With patient Time For Goal Achievement: 10/09/15 Potential to Achieve Goals: Fair    Frequency Min 2X/week   Barriers to discharge        Co-evaluation               End of Session Equipment Utilized During Treatment: Gait belt;Oxygen Activity Tolerance: Patient limited by fatigue (some confusion) Patient left: with bed alarm set           Time: [id number] PT Time Calculation (min) (ACUTE ONLY): 26 min   Charges:   PT Evaluation $Initial PT Evaluation Tier I: 1 Procedure     PT G Codes:       Wayne Both, PT, DPT [phone]  Kreg Shropshire 09/25/2015, 5:28 PM

## 2015-09-25 NOTE — Progress Notes (Signed)
Spoke with Dr. Marcille Blanco and he gave permission to change psych consult from stat to routine.

## 2015-09-25 NOTE — Progress Notes (Signed)
Pts BP 97/62 HR 78, Per MD Patel, hold morning dose of BP meds (see MAR). Will continue to monitor.

## 2015-09-25 NOTE — Evaluation (Signed)
Occupational Therapy Evaluation Patient Details Name: Zachary Walls MRN: [id number] DOB: 07/27/1956 Today's Date: 09/25/2015    History of Present Illness Pt is 59 year old male with hx of COPD, on home O2 at 1L, HTN, hyperlipidemia, GERD, tobacco and alcohol and marijuana abuse, CAD, PVD, cardiac arrest, small bowel resection, current colostomy.  The patient presents to the emergency department concern from his family of his frequent falls. The patient states that he fell into a chair and hurt his wrist area and he also states that he "tumbled down the back stairs" and hit the back of his head that made a "squishing" sound. His family stated prior to my interview that he has not been acting himself lately. CT scan of his head was negative for acute injury or intracranial process. Laboratory evaluation revealed elevated ammonia level which front to the emergency department staff to call for admission.    Clinical Impression   Pt presents with decreased safety and at risk for falls due to impulsivity, decreased balance, impaired safety awareness and ataxic like movements of LEs during functional mobility during ADL assessment.  He has a colostomy and uses urinal independently.  He states he can take care of colostomy care without assistance but has hx of several falls at home with the last fall resulting in him hitting his head.  He has overall generalized weakness in BUEs with minimal muscle mass and decreased activity tolerance.  He requires minimal assistance and mod cues for LB dressing skills sitting EOB, and able to complete grooming, self feeding and UB dressing skills with supervision after set up despite hx of decreased AROM in RUE from pt reporting broken shoulder in past (AROM limited to 90 degrees in shoulder flexion and minimal to no IR/ER).  Pt would benefit from continued OT for ADL retraining, therapeutic activities to increase functional endurance, balance training and safety awareness  training.  Rec SNF for rehab after DC from hospital since he lives at home alone and is at risk for further falls.    Follow Up Recommendations  SNF    Equipment Recommendations   (will assess based on DC plan--may benefit from hospital bed)    Recommendations for Other Services       Precautions / Restrictions Precautions Precautions: Fall Precaution Comments: on O2 1L nasal cannula Restrictions Weight Bearing Restrictions: No      Mobility Bed Mobility                  Transfers                      Balance                                            ADL Overall ADL's : Needs assistance/impaired                                       General ADL Comments: Pt is impulsive and at risk for falls due to this and decreased balance when leaning forward for LB dressing at EOB requiring min assist and mod cues.  He is anxious and HOH and needs directions simple and extra time for processing.  He is able to complete feeding, hygiene and UB dressing skills with supervision after  set up.  Stamina for functional ADLs limited due to  COPD and has difficulty communicating full sentences due to poor airway support.       Vision     Perception     Praxis      Pertinent Vitals/Pain Pain Assessment: No/denies pain     Hand Dominance Right   Extremity/Trunk Assessment Upper Extremity Assessment Upper Extremity Assessment: Generalized weakness;RUE deficits/detail RUE Deficits / Details: Limited AROM to 90 degrees shoulder flexion due to pt reporting he broke his shoulder in past and this is not new.  Full AROM in R hand and wrist but overall has reduced muscle mass, frail and weak BUEs and hands.   Lower Extremity Assessment Lower Extremity Assessment: Defer to PT evaluation       Communication Communication Communication: HOH   Cognition Arousal/Alertness: Awake/alert Behavior During Therapy:  Restless;Anxious;Impulsive Overall Cognitive Status: Difficult to assess (pt very angry and asked not to continue using profanity when expressing that he is not ok to go home like last time)                     General Comments       Exercises       Shoulder Instructions      Home Living Family/patient expects to be discharged to:: Private residence Living Arrangements: Alone Available Help at Discharge: Family;Friend(s);Available PRN/intermittently Type of Home: Mobile home Home Access: Stairs to enter Entrance Stairs-Number of Steps: 3-4 Entrance Stairs-Rails: Right;Left;Can reach both Home Layout: One level     Bathroom Shower/Tub: Tub/shower unit Shower/tub characteristics: Curtain Biochemist, clinical: Standard     Home Equipment: Environmental consultant - 2 wheels   Additional Comments: pt's son lives down the street and helps as needed      Prior Functioning/Environment Level of Independence: Independent        Comments: hx of multiple falls in past year.  Has hearing aid for R ear.    OT Diagnosis: Generalized weakness (ataxic movements in BLEs during functional mobility but not sure if related to alcohol abuse vs neuro change)   OT Problem List: Decreased strength;Decreased activity tolerance;Decreased safety awareness   OT Treatment/Interventions: Self-care/ADL training;Therapeutic activities;Patient/family education    OT Goals(Current goals can be found in the care plan section) Acute Rehab OT Goals Patient Stated Goal: "to get the help I need" OT Goal Formulation: With patient Time For Goal Achievement: 10/09/15 Potential to Achieve Goals: Fair ADL Goals Pt Will Perform Lower Body Dressing: with set-up;with min assist;sit to/from stand (sitting in chair where feet touch the floor) Pt Will Transfer to Toilet: with supervision;regular height toilet (with FWW and mod cues for safety) Pt Will Perform Toileting - Clothing Manipulation and hygiene: with supervision;sit  to/from stand  OT Frequency: Min 1X/week   Barriers to D/C:    lives at home alone       Co-evaluation              End of Session Nurse Communication:  (pt asking about flu and pneumonia shot)  Activity Tolerance: Patient limited by fatigue Patient left: in bed;with call bell/phone within reach;with bed alarm set   Time: 1315-1350 OT Time Calculation (min): 35 min Charges:  OT General Charges $OT Visit: 1 Procedure OT Evaluation $Initial OT Evaluation Tier I: 1 Procedure OT Treatments $Self Care/Home Management : 8-22 mins G-Codes:    Gaynel Schaafsma October 19, 2015, 1:53 PM    Chrys Racer, OTR/L As[employer] [phone]
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
## 2015-09-25 NOTE — Progress Notes (Signed)
Patient had been asleep. I went over the Education of the Health Directive some with him but he was not ready to do this at this time. I left it for him and told him to have his nurse page [place] when he is ready to go over and complete the Health Directive. [place] 1200

## 2015-09-25 NOTE — H&P (Signed)
Zachary Walls is an 59 y.o. male.   Chief Complaint: Falls HPI: The patient presents to the emergency department concern from his family of his frequent falls. The patient states that he fell into a chair and hurt his wrist area and he also states that he "tumbled down the back stairs" and hit the back of his head that made a "squishing" sound. His family stated prior to my interview that he has not been acting himself lately. CT scan of his head was negative for acute injury or intracranial process. Laboratory evaluation revealed elevated ammonia level which front to the emergency department staff to call for admission.  Past Medical History  Diagnosis Date  . COPD (chronic obstructive pulmonary disease) ([place])   . History of pneumonia   . Hypertension     a. Normotensive 08/2013 off meds.  [name] HOH (hard of hearing)   . Protein calorie malnutrition ([place])   . ETOH abuse   . Tobacco abuse   . Diverticulitis     a. s/p colon resection and colostomy  . Cardiac arrest ([place])     a. 01/2013, felt to be 2/2 severe COPD req trach;  b. 01/2013 Echo: EF 65-70% w/o rwma.  . Difficult intubation   . CAD (coronary artery disease)     a. Nonobstructive CAD by cath 08/2013 (mod RCA, mid LAD myocardial bridge) - ECG was concerning for inf STEMI but pt ruled out. CP possibly GERD.   [name] Hyponatremia     a. During 08/2013 felt due to polydipsia.  . Polysubstance abuse     a. Tobacco, marijuana, alcoholism.  [name] Anxiety   . Shortness of breath   . Peripheral vascular disease ([place])   . Prostate cancer [facility])     Past Surgical History  Procedure Laterality Date  . Hernia repair      inguinal  . Colostomy  12/11/2011    Procedure: COLOSTOMY;  Surgeon: Rolm Bookbinder, MD;  Location: [facility];  Service: General;  Laterality: N/A;  . Colostomy revision  12/11/2011    Procedure: COLON RESECTION SIGMOID;  Surgeon: Rolm Bookbinder, MD;  Location: [facility];  Service: General;;  . Bowel resection  12/11/2011     Procedure: SMALL BOWEL RESECTION;  Surgeon: Rolm Bookbinder, MD;  Location: [facility];  Service: General;;  times 2  . Cystoscopy w/ ureteral stent placement  12/11/2011    Procedure: CYSTOSCOPY WITH STENT REPLACEMENT;  Surgeon: Malka So, MD;  Location: [facility];  Service: Urology;  Laterality: Left;  ureteral catheter placement  . Cardiac catheterization  08/25/2013  . Tracheostomy  01/2013    emergent d/t difficult intubation  . Colon surgery    . Tracheostomy closure    . Left heart catheterization with coronary angiogram N/A 08/25/2013    Procedure: LEFT HEART CATHETERIZATION WITH CORONARY ANGIOGRAM;  Surgeon: Wellington Hampshire, MD;  Location: [place] CATH [facility];  Service: Cardiovascular;  Laterality: N/A;    Family History  Problem Relation Age of Onset  . Heart attack Father   . Cancer Father     unknown type  . HIV Brother    Social History:  reports that he has been smoking Cigarettes.  He has a 22 pack-year smoking history. He has never used smokeless tobacco. He reports that he drinks alcohol. He reports that he uses illicit drugs (Marijuana).  Allergies:  Allergies  Allergen Reactions  . Bee Venom Shortness Of Breath and Swelling  . Shellfish Allergy Anaphylaxis  . Codeine Itching  and Rash     (Not in a hospital admission)  Results for orders placed or performed during the hospital encounter of 09/24/15 (from the past 48 hour(s))  CBC with Differential     Status: Abnormal   Collection Time: 09/24/15  5:18 PM  Result Value Ref Range   WBC 8.5 3.8 - 10.6 K/uL   RBC 4.13 (L) 4.40 - 5.90 MIL/uL   Hemoglobin 12.0 (L) 13.0 - 18.0 g/dL   HCT 37.6 (L) 40.0 - 52.0 %   MCV 91.1 80.0 - 100.0 fL   MCH 29.0 26.0 - 34.0 pg   MCHC 31.9 (L) 32.0 - 36.0 g/dL   RDW 14.7 (H) 11.5 - 14.5 %   Platelets 216 150 - 440 K/uL   Neutrophils Relative % 59 %   Neutro Abs 5.0 1.4 - 6.5 K/uL   Lymphocytes Relative 26 %   Lymphs Abs 2.2 1.0 - 3.6 K/uL   Monocytes Relative 12 %   Monocytes  Absolute 1.0 0.2 - 1.0 K/uL   Eosinophils Relative 2 %   Eosinophils Absolute 0.2 0 - 0.7 K/uL   Basophils Relative 1 %   Basophils Absolute 0.1 0 - 0.1 K/uL  Comprehensive metabolic panel     Status: Abnormal   Collection Time: 09/24/15  5:18 PM  Result Value Ref Range   Sodium 138 135 - 145 mmol/L   Potassium 4.3 3.5 - 5.1 mmol/L   Chloride 91 (L) 101 - 111 mmol/L   CO2 43 (H) 22 - 32 mmol/L   Glucose, Bld 128 (H) 65 - 99 mg/dL   BUN 7 6 - 20 mg/dL   Creatinine, Ser 0.68 0.61 - 1.24 mg/dL   Calcium 8.6 (L) 8.9 - 10.3 mg/dL   Total Protein 6.3 (L) 6.5 - 8.1 g/dL   Albumin 3.3 (L) 3.5 - 5.0 g/dL   AST 15 15 - 41 U/L   ALT 8 (L) 17 - 63 U/L   Alkaline Phosphatase 64 38 - 126 U/L   Total Bilirubin <0.1 (L) 0.3 - 1.2 mg/dL   GFR calc non Af Amer >60 >60 mL/min   GFR calc Af Amer >60 >60 mL/min    Comment: (NOTE) The eGFR has been calculated using the CKD EPI equation. This calculation has not been validated in all clinical situations. eGFR's persistently <60 mL/min signify possible Chronic Kidney Disease.    Anion gap 4 (L) 5 - 15  Lipase, blood     Status: None   Collection Time: 09/24/15  5:18 PM  Result Value Ref Range   Lipase 31 11 - 51 U/L  Ethanol     Status: None   Collection Time: 09/24/15  5:18 PM  Result Value Ref Range   Alcohol, Ethyl (B) <5 <5 mg/dL    Comment:        LOWEST DETECTABLE LIMIT FOR SERUM ALCOHOL IS 5 mg/dL FOR MEDICAL PURPOSES ONLY   Acetaminophen level     Status: Abnormal   Collection Time: 09/24/15  5:18 PM  Result Value Ref Range   Acetaminophen (Tylenol), Serum <10 (L) 10 - 30 ug/mL    Comment:        THERAPEUTIC CONCENTRATIONS VARY SIGNIFICANTLY. A RANGE OF 10-30 ug/mL MAY BE AN EFFECTIVE CONCENTRATION FOR MANY PATIENTS. HOWEVER, SOME ARE BEST TREATED AT CONCENTRATIONS OUTSIDE THIS RANGE. ACETAMINOPHEN CONCENTRATIONS >150 ug/mL AT 4 HOURS AFTER INGESTION AND >50 ug/mL AT 12 HOURS AFTER INGESTION ARE OFTEN ASSOCIATED WITH  TOXIC REACTIONS.   Salicylate level  Status: None   Collection Time: 09/24/15  5:18 PM  Result Value Ref Range   Salicylate Lvl <5.3 2.8 - 30.0 mg/dL  Troponin I     Status: None   Collection Time: 09/24/15  5:18 PM  Result Value Ref Range   Troponin I <0.03 <0.031 ng/mL    Comment:        NO INDICATION OF MYOCARDIAL INJURY.   TSH     Status: None   Collection Time: 09/24/15  5:18 PM  Result Value Ref Range   TSH 0.968 0.350 - 4.500 uIU/mL  Ammonia     Status: Abnormal   Collection Time: 09/24/15 10:15 PM  Result Value Ref Range   Ammonia 68 (H) 9 - 35 umol/L   Dg Chest 2 View  09/23/2015  CLINICAL DATA:  Fall with near syncopal episode today. Multiple recent falls. Initial encounter. EXAM: CHEST  2 VIEW COMPARISON:  07/12/2015 and 12/08/2014 radiographs.  CT 01/29/2014. FINDINGS: The heart size and mediastinal contours are stable. The lungs are hyperinflated. There is new irregular density at the right apex which is not typical for acute inflammation and may reflect postinflammatory scarring or a developing mass. There are multiple old right-sided rib and clavicle fractures. No acute fractures, significant pleural effusion or pneumothorax demonstrated. IMPRESSION: 1. New right apical density could reflect post inflammatory scarring, although a developing mass cannot be excluded. Further evaluation with chest CT recommended to exclude neoplasm. 2. No other significant changes identified. Chronic obstructive lung disease with old right-sided rib and clavicle fractures. Electronically Signed   By: Richardean Sale M.D.   On: 09/23/2015 18:36   Ct Head Wo Contrast  09/23/2015  CLINICAL DATA:  Fall, near syncope. EXAM: CT HEAD WITHOUT CONTRAST CT CERVICAL SPINE WITHOUT CONTRAST TECHNIQUE: Multidetector CT imaging of the head and cervical spine was performed following the standard protocol without intravenous contrast. Multiplanar CT image reconstructions of the cervical spine were also  generated. COMPARISON:  Head CT dated 11/22/2014. FINDINGS: CT HEAD FINDINGS Ventricles remain normal in size and configuration. All areas of the brain demonstrate grossly normal gray-white matter attenuation. There is no mass, hemorrhage, edema, or other evidence of acute parenchymal abnormality. No extra-axial hemorrhage. No osseous abnormality. Paranasal sinuses and mastoid air cells are clear. Superficial soft tissues are unremarkable. CT CERVICAL SPINE FINDINGS Minimal degenerative change noted within the cervical spine. Osseous alignment is normal. No fracture line or displaced fracture fragment identified. Posterior elements appear intact and well aligned throughout. Paravertebral soft tissues are unremarkable. Large lucency at the right lung apex is presumably severe bullous change related to underlying emphysema. Additional milder emphysematous change within the left lung apex. IMPRESSION: 1. Normal head CT. No intracranial mass, hemorrhage, or edema. No fracture. 2. No fracture or dislocation within the cervical spine. 3. Large area of hyperlucency at the right lung apex, incompletely imaged at the lower aspects of the cervical spine CT. This is most likely severe bullous change related to underlying emphysema but would consider chest x-ray to exclude the less likely possibility of pneumothorax. Electronically Signed   By: Franki Cabot M.D.   On: 09/23/2015 19:53   Ct Chest Wo Contrast  09/23/2015  CLINICAL DATA:  Syncope, shortness of breath, cough EXAM: CT CHEST WITHOUT CONTRAST TECHNIQUE: Multidetector CT imaging of the chest was performed following the standard protocol without IV contrast. COMPARISON:  CT chest 01/29/2014 FINDINGS: The central airways are patent. There is bilateral centrilobular and paraseptal emphysema. There is severe bullous disease at  the right lung apex. There is a bandlike area of nodular airspace disease in the right upper lobe corresponding to the chest x-ray abnormality  with a dominant nodular component along the inferior margin measuring 12.5 mm. There is no other pulmonary mass, focal consolidation or nodule. There is no pleural effusion or pneumothorax. There are no pathologically enlarged axillary, hilar or mediastinal lymph nodes. The heart size is normal. There is coronary artery atherosclerosis in the left main and RCA. There is no pericardial effusion. The thoracic aorta is normal in caliber. Review of bone windows demonstrates no focal lytic or sclerotic lesions. There is an old healed right fifth lateral rib fracture. Limited non-contrast images of the upper abdomen were obtained. The adrenal glands appear normal. The remainder of the visualized abdominal organs are unremarkable. IMPRESSION: 1. No acute cardiopulmonary process. 2. Bandlike area of nodular airspace disease in the right upper lobe corresponding to the chest x-ray abnormality. The appearance is likely secondary to post inflammatory scarring but there is a dominant 12.5 mm nodule along the inferior margin. Recommend follow-up CT in 3 months to ensure stability. 3. Severe bilateral emphysematous changes with bullous disease at the right lung apex. Electronically Signed   By: Kathreen Devoid   On: 09/23/2015 20:01   Ct Cervical Spine Wo Contrast  09/23/2015  CLINICAL DATA:  Fall, near syncope. EXAM: CT HEAD WITHOUT CONTRAST CT CERVICAL SPINE WITHOUT CONTRAST TECHNIQUE: Multidetector CT imaging of the head and cervical spine was performed following the standard protocol without intravenous contrast. Multiplanar CT image reconstructions of the cervical spine were also generated. COMPARISON:  Head CT dated 11/22/2014. FINDINGS: CT HEAD FINDINGS Ventricles remain normal in size and configuration. All areas of the brain demonstrate grossly normal gray-white matter attenuation. There is no mass, hemorrhage, edema, or other evidence of acute parenchymal abnormality. No extra-axial hemorrhage. No osseous abnormality.  Paranasal sinuses and mastoid air cells are clear. Superficial soft tissues are unremarkable. CT CERVICAL SPINE FINDINGS Minimal degenerative change noted within the cervical spine. Osseous alignment is normal. No fracture line or displaced fracture fragment identified. Posterior elements appear intact and well aligned throughout. Paravertebral soft tissues are unremarkable. Large lucency at the right lung apex is presumably severe bullous change related to underlying emphysema. Additional milder emphysematous change within the left lung apex. IMPRESSION: 1. Normal head CT. No intracranial mass, hemorrhage, or edema. No fracture. 2. No fracture or dislocation within the cervical spine. 3. Large area of hyperlucency at the right lung apex, incompletely imaged at the lower aspects of the cervical spine CT. This is most likely severe bullous change related to underlying emphysema but would consider chest x-ray to exclude the less likely possibility of pneumothorax. Electronically Signed   By: Franki Cabot M.D.   On: 09/23/2015 19:53    Review of Systems  Constitutional: Negative for fever and chills.  HENT: Negative for sore throat and tinnitus.   Eyes: Negative for blurred vision and redness.  Respiratory: Negative for cough and shortness of breath.   Cardiovascular: Negative for chest pain, palpitations, orthopnea and PND.  Gastrointestinal: Negative for nausea, vomiting, abdominal pain and diarrhea.  Genitourinary: Negative for dysuria, urgency and frequency.  Musculoskeletal: Positive for back pain, joint pain and falls. Negative for myalgias.  Skin: Negative for rash.       No lesions  Neurological: Negative for speech change, focal weakness and weakness.  Endo/Heme/Allergies: Does not bruise/bleed easily.       No temperature intolerance  Psychiatric/Behavioral: Negative for  depression and suicidal ideas.    Blood pressure 108/57, pulse 65, temperature 97.8 F (36.6 C), temperature source  Oral, resp. rate 24, height $RemoveBe'5\' 7"'[id number]$  (1.702 m), weight 51.71 kg (114 lb), SpO2 100 %. Physical Exam  Nursing note and vitals reviewed. Constitutional: He is oriented to person, place, and time. He appears well-developed. No distress.  Cachectic  HENT:  Head: Normocephalic and atraumatic.  Mouth/Throat: Oropharynx is clear and moist.  Hard of hearing  Eyes: Conjunctivae and EOM are normal. Pupils are equal, round, and reactive to light. No scleral icterus.  Neck: Normal range of motion. Neck supple. No JVD present. No tracheal deviation present. No thyromegaly present.  Well-healed tracheostomy site  Cardiovascular: Normal rate, regular rhythm and normal heart sounds.  Exam reveals no gallop and no friction rub.   No murmur heard. Respiratory: Effort normal and breath sounds normal. No respiratory distress.  GI: Soft. Bowel sounds are normal.  Colostomy bag in place; no blood seen in bag  Genitourinary:  Deferred  Musculoskeletal: Normal range of motion. He exhibits no edema.  Lymphadenopathy:    He has no cervical adenopathy.  Neurological: He is alert and oriented to person, place, and time. No cranial nerve deficit.  Skin: Skin is warm and dry. No rash noted. No erythema.  Psychiatric: He has a normal mood and affect. His behavior is normal. Judgment and thought content normal.     Assessment/Plan This is a 59 year old Caucasian male admitted for encephalopathy likely secondary to hepatic failure. 1. Encephalopathy: Possibly due to elevated ammonia level although does not correlate all the time with degree of delirium. The patient's speech is garbled. He has no neurologic deficits and head CT is normal. We will hydrate the patient and scheduled lactulose. I suspect the patient also has ongoing alcohol abuse even though he states he does not drink. However, he did admit that "he could have one sometimes" but does not drink as he did before. Instability may be secondary to B12 deficiency.  Check cobalamin and methylmalonic acid. Supplement B12 and folate empirically. CIWA precautions placed. 2. COPD: The patient is on oxygen at home 24/7. Continue supplemental oxygen to maintain oxygen saturations 88-94%. Albuterol when necessary. Combivent scheduled. May restart oral steroids as the patient has difficulty completing sentences at times (denies air hunger; oxygen saturation stable on chronic supplemental flow). 3. Hyponatremia: Likely related to lung disease and suspected alcohol abuse. Hydrate with normal saline. 4. Falls: Physical therapy and occupational therapy eval ordered 5. Hypertension: Controlled, continue metoprolol and valsartan 6. Malnutrition: Dietary consultation ordered 7. Depression: Continue Lexapro 8. DVT prophylaxis: Heparin 9. GI prophylaxis: H2 blocker per home regimen (likely secondary to history of gastritis due to alcohol abuse) The patient is full code. Time spent on admission orders and patient care possibly 45 minutes  Harrie Foreman 09/25/2015, 1:34 AM

## 2015-09-25 NOTE — ED Notes (Signed)
Pt to floor with clothing and oxygen infusing

## 2015-09-25 NOTE — Progress Notes (Signed)
[place] at [facility]                                                                                                                                                                                            Patient Demographics   Zachary Walls, is a 59 y.o. male, DOB - [birth year], [id number]  Admit date - 09/24/2015   Admitting Physician Harrie Foreman, MD  Outpatient Primary MD for the patient is Millsaps, [name], NP   LOS - 0  Subjective: Patient admitted with fall and some confusion. Currently his mental status improved he is very hard of hearing. His chronic shortness of breath which is severe     Review of Systems:   CONSTITUTIONAL: No documented fever. No fatigue, weakness. No weight gain, no weight loss.  EYES: No blurry or double vision.  ENT: No tinnitus. No postnasal drip. No redness of the oropharynx.  RESPIRATORY: No cough, no wheeze, no hemoptysis. Chronic dyspnea.  CARDIOVASCULAR: No chest pain. No orthopnea. No palpitations. No syncope.  GASTROINTESTINAL: No nausea, no vomiting or diarrhea. No abdominal pain. No melena or hematochezia.  GENITOURINARY: No dysuria or hematuria.  ENDOCRINE: No polyuria or nocturia. No heat or cold intolerance.  HEMATOLOGY: No anemia. No bruising. No bleeding.  INTEGUMENTARY: No rashes. No lesions.  MUSCULOSKELETAL: No arthritis. No swelling. No gout.  NEUROLOGIC: No numbness, tingling, or ataxia. No seizure-type activity.  PSYCHIATRIC: No anxiety. No insomnia. No ADD.    Vitals:   Filed Vitals:   09/25/15 0045 09/25/15 0136 09/25/15 0220 09/25/15 0810  BP:  109/48 106/57 97/62  Pulse: 65 68 65 78  Temp:   97.4 F (36.3 C) 98 F (36.7 C)  TempSrc:   Oral Oral  Resp:  22 20 18   Height:      Weight:   44.407 kg (97 lb 14.4 oz)   SpO2: 100% 98% 98% 100%    Wt Readings from Last 3 Encounters:  09/25/15 44.407 kg (97 lb 14.4 oz)  09/23/15 51.71 kg (114 lb)  07/14/15 43.364 kg  (95 lb 9.6 oz)     Intake/Output Summary (Last 24 hours) at 09/25/15 1452 Last data filed at 09/25/15 1300  Gross per 24 hour  Intake 702.92 ml  Output    495 ml  Net 207.92 ml    Physical Exam:   GENERAL: Pleasant-appearing in no apparent distress. Cachectic  HEAD, EYES, EARS, NOSE AND THROAT: Atraumatic, normocephalic. Extraocular muscles are intact. Pupils equal and reactive to light. Sclerae anicteric. No conjunctival injection. No oro-pharyngeal erythema.  NECK: Supple. There is no jugular venous distention.  No bruits, no lymphadenopathy, no thyromegaly.  HEART: Regular rate and rhythm,. No murmurs, no rubs, no clicks.  LUNGS: Diminished breath sounds bilaterally without any necessary muscle usage there is no wheezing rhonchi or rales  ABDOMEN: Soft, flat, nontender, nondistended. Has good bowel sounds. No hepatosplenomegaly appreciated.  EXTREMITIES: No evidence of any cyanosis, clubbing, or peripheral edema.  +2 pedal and radial pulses bilaterally.  NEUROLOGIC: The patient is alert, awake, and oriented x3 with no focal motor or sensory deficits appreciated bilaterally.  SKIN: Moist and warm with no rashes appreciated.  Psych: Not anxious, depressed LN: No inguinal LN enlargement    Antibiotics   Anti-infectives    None      Medications   Scheduled Meds: . atorvastatin  20 mg Oral QPM  . cyanocobalamin  500 mcg Oral Daily  . docusate sodium  100 mg Oral BID  . escitalopram  10 mg Oral Daily  . folic acid  1 mg Oral Daily  . guaiFENesin  600 mg Oral BID  . heparin  5,000 Units Subcutaneous 3 times per day  . [START ON 09/26/2015] Influenza vac split quadrivalent PF  0.5 mL Intramuscular Tomorrow-1000  . lactulose  20 g Oral Once  . lactulose  30 g Oral BID  . metoprolol tartrate  25 mg Oral BID  . nicotine  14 mg Transdermal Daily  . pantoprazole  40 mg Oral Daily  . [START ON 09/26/2015] pneumococcal 23 valent vaccine  0.5 mL Intramuscular Tomorrow-1000  .  [START ON 09/26/2015] predniSONE  40 mg Oral Once   And  . [START ON 09/27/2015] predniSONE  30 mg Oral Once   And  . [START ON 09/28/2015] predniSONE  20 mg Oral Once   And  . [START ON 09/29/2015] predniSONE  10 mg Oral Once   And  . [START ON 09/30/2015] predniSONE  5 mg Oral Once  . QUEtiapine  50 mg Oral q morning - 10a  . risperiDONE  0.5 mg Oral BID  . sucralfate  1 g Oral TID WC & HS  . thiamine  100 mg Oral Daily  . tiotropium  18 mcg Inhalation Daily   Continuous Infusions: . sodium chloride 125 mL/hr at 09/25/15 0213   PRN Meds:.albuterol, diazepam, diphenhydrAMINE, ipratropium-albuterol, nitroGLYCERIN, ondansetron **OR** ondansetron (ZOFRAN) IV   Data Review:   Micro Results No results found for this or any previous visit (from the past 240 hour(s)).  Radiology Reports Dg Chest 2 View  09/23/2015  CLINICAL DATA:  Fall with near syncopal episode today. Multiple recent falls. Initial encounter. EXAM: CHEST  2 VIEW COMPARISON:  07/12/2015 and 12/08/2014 radiographs.  CT 01/29/2014. FINDINGS: The heart size and mediastinal contours are stable. The lungs are hyperinflated. There is new irregular density at the right apex which is not typical for acute inflammation and may reflect postinflammatory scarring or a developing mass. There are multiple old right-sided rib and clavicle fractures. No acute fractures, significant pleural effusion or pneumothorax demonstrated. IMPRESSION: 1. New right apical density could reflect post inflammatory scarring, although a developing mass cannot be excluded. Further evaluation with chest CT recommended to exclude neoplasm. 2. No other significant changes identified. Chronic obstructive lung disease with old right-sided rib and clavicle fractures. Electronically Signed   By: Richardean Sale M.D.   On: 09/23/2015 18:36   Ct Head Wo Contrast  09/23/2015  CLINICAL DATA:  Fall, near syncope. EXAM: CT HEAD WITHOUT CONTRAST CT CERVICAL SPINE WITHOUT  CONTRAST TECHNIQUE: Multidetector CT imaging of  the head and cervical spine was performed following the standard protocol without intravenous contrast. Multiplanar CT image reconstructions of the cervical spine were also generated. COMPARISON:  Head CT dated 11/22/2014. FINDINGS: CT HEAD FINDINGS Ventricles remain normal in size and configuration. All areas of the brain demonstrate grossly normal gray-white matter attenuation. There is no mass, hemorrhage, edema, or other evidence of acute parenchymal abnormality. No extra-axial hemorrhage. No osseous abnormality. Paranasal sinuses and mastoid air cells are clear. Superficial soft tissues are unremarkable. CT CERVICAL SPINE FINDINGS Minimal degenerative change noted within the cervical spine. Osseous alignment is normal. No fracture line or displaced fracture fragment identified. Posterior elements appear intact and well aligned throughout. Paravertebral soft tissues are unremarkable. Large lucency at the right lung apex is presumably severe bullous change related to underlying emphysema. Additional milder emphysematous change within the left lung apex. IMPRESSION: 1. Normal head CT. No intracranial mass, hemorrhage, or edema. No fracture. 2. No fracture or dislocation within the cervical spine. 3. Large area of hyperlucency at the right lung apex, incompletely imaged at the lower aspects of the cervical spine CT. This is most likely severe bullous change related to underlying emphysema but would consider chest x-ray to exclude the less likely possibility of pneumothorax. Electronically Signed   By: Franki Cabot M.D.   On: 09/23/2015 19:53   Ct Chest Wo Contrast  09/23/2015  CLINICAL DATA:  Syncope, shortness of breath, cough EXAM: CT CHEST WITHOUT CONTRAST TECHNIQUE: Multidetector CT imaging of the chest was performed following the standard protocol without IV contrast. COMPARISON:  CT chest 01/29/2014 FINDINGS: The central airways are patent. There is  bilateral centrilobular and paraseptal emphysema. There is severe bullous disease at the right lung apex. There is a bandlike area of nodular airspace disease in the right upper lobe corresponding to the chest x-ray abnormality with a dominant nodular component along the inferior margin measuring 12.5 mm. There is no other pulmonary mass, focal consolidation or nodule. There is no pleural effusion or pneumothorax. There are no pathologically enlarged axillary, hilar or mediastinal lymph nodes. The heart size is normal. There is coronary artery atherosclerosis in the left main and RCA. There is no pericardial effusion. The thoracic aorta is normal in caliber. Review of bone windows demonstrates no focal lytic or sclerotic lesions. There is an old healed right fifth lateral rib fracture. Limited non-contrast images of the upper abdomen were obtained. The adrenal glands appear normal. The remainder of the visualized abdominal organs are unremarkable. IMPRESSION: 1. No acute cardiopulmonary process. 2. Bandlike area of nodular airspace disease in the right upper lobe corresponding to the chest x-ray abnormality. The appearance is likely secondary to post inflammatory scarring but there is a dominant 12.5 mm nodule along the inferior margin. Recommend follow-up CT in 3 months to ensure stability. 3. Severe bilateral emphysematous changes with bullous disease at the right lung apex. Electronically Signed   By: Kathreen Devoid   On: 09/23/2015 20:01   Ct Cervical Spine Wo Contrast  09/23/2015  CLINICAL DATA:  Fall, near syncope. EXAM: CT HEAD WITHOUT CONTRAST CT CERVICAL SPINE WITHOUT CONTRAST TECHNIQUE: Multidetector CT imaging of the head and cervical spine was performed following the standard protocol without intravenous contrast. Multiplanar CT image reconstructions of the cervical spine were also generated. COMPARISON:  Head CT dated 11/22/2014. FINDINGS: CT HEAD FINDINGS Ventricles remain normal in size and  configuration. All areas of the brain demonstrate grossly normal gray-white matter attenuation. There is no mass, hemorrhage, edema, or other  evidence of acute parenchymal abnormality. No extra-axial hemorrhage. No osseous abnormality. Paranasal sinuses and mastoid air cells are clear. Superficial soft tissues are unremarkable. CT CERVICAL SPINE FINDINGS Minimal degenerative change noted within the cervical spine. Osseous alignment is normal. No fracture line or displaced fracture fragment identified. Posterior elements appear intact and well aligned throughout. Paravertebral soft tissues are unremarkable. Large lucency at the right lung apex is presumably severe bullous change related to underlying emphysema. Additional milder emphysematous change within the left lung apex. IMPRESSION: 1. Normal head CT. No intracranial mass, hemorrhage, or edema. No fracture. 2. No fracture or dislocation within the cervical spine. 3. Large area of hyperlucency at the right lung apex, incompletely imaged at the lower aspects of the cervical spine CT. This is most likely severe bullous change related to underlying emphysema but would consider chest x-ray to exclude the less likely possibility of pneumothorax. Electronically Signed   By: Franki Cabot M.D.   On: 09/23/2015 19:53     CBC  Recent Labs Lab 09/23/15 1842 09/24/15 1718  WBC 8.3 8.5  HGB 13.1 12.0*  HCT 40.3 37.6*  PLT 230 216  MCV 91.2 91.1  MCH 29.7 29.0  MCHC 32.5 31.9*  RDW 14.7* 14.7*  LYMPHSABS 1.9 2.2  MONOABS 0.8 1.0  EOSABS 0.1 0.2  BASOSABS 0.0 0.1    Chemistries   Recent Labs Lab 09/22/15 1930 09/23/15 1842 09/24/15 1718  NA  --  137 138  K  --  4.6 4.3  CL  --  88* 91*  CO2  --  48* 43*  GLUCOSE  --  107* 128*  BUN  --  6 7  CREATININE  --  0.69 0.68  CALCIUM  --  9.1 8.6*  MG 1.9  --   --   AST  --  16 15  ALT  --  8* 8*  ALKPHOS  --  71 64  BILITOT  --  0.7 <0.1*    ------------------------------------------------------------------------------------------------------------------ estimated creatinine clearance is 62.4 mL/min (by C-G formula based on Cr of 0.68). ------------------------------------------------------------------------------------------------------------------ No results for input(s): HGBA1C in the last 72 hours. ------------------------------------------------------------------------------------------------------------------ No results for input(s): CHOL, HDL, LDLCALC, TRIG, CHOLHDL, LDLDIRECT in the last 72 hours. ------------------------------------------------------------------------------------------------------------------  Recent Labs  09/24/15 1718  TSH 0.968   ------------------------------------------------------------------------------------------------------------------ No results for input(s): VITAMINB12, FOLATE, FERRITIN, TIBC, IRON, RETICCTPCT in the last 72 hours.  Coagulation profile No results for input(s): INR, PROTIME in the last 168 hours.  No results for input(s): DDIMER in the last 72 hours.  Cardiac Enzymes  Recent Labs Lab 09/23/15 1842 09/24/15 1718  TROPONINI <0.03 <0.03   ------------------------------------------------------------------------------------------------------------------ Invalid input(s): POCBNP    Assessment & Plan   AAssessment/Plan This is a 59 year old Caucasian male admitted for encephalopathy likely multifactorial in nature  1. Encephalopathy: Possibly due to elevated ammonia level , also could be related to him severe COPD and intermittent hypoxia  2. COPD: The patient is on oxygen at home 24/7. Continue supplemental oxygen to maintain oxygen saturations 88-94%. Albuterol when necessary. Combivent scheduled. Continue prednisone  3. Hyponatremia: Likely related to lung disease and suspected alcohol abuse. Hydrate with normal saline. 4. Falls: Physical therapy and  occupational therapy eval ordered 5. Hypertension: Controlled, continue metoprolol and valsartan 6. Malnutrition: Dietary consultation ordered 7. Depression: Continue Lexapro 8. DVT prophylaxis: Heparin 9. 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              DVT Prophylaxis  heparin  Lab Results  Component Value Date   PLT 216 09/24/2015     Time Spent in minutes  46min  Greater than 50% of time spent in care coordination and counseling.   Dustin Flock M.D on 09/25/2015 at 2:52 PM  Between 7am to 6pm - Pager - [phone]  After 6pm go to [email or web address] - password [email or web address] [facility]   Office  [phone]

## 2015-09-26 DIAGNOSIS — F1027 Alcohol dependence with alcohol-induced persisting dementia: Secondary | ICD-10-CM | POA: Insufficient documentation

## 2015-09-26 DIAGNOSIS — F1097 Alcohol use, unspecified with alcohol-induced persisting dementia: Secondary | ICD-10-CM

## 2015-09-26 LAB — BASIC METABOLIC PANEL
ANION GAP: 5 (ref 5–15)
BUN: 7 mg/dL (ref 6–20)
CALCIUM: 8.7 mg/dL — AB (ref 8.9–10.3)
CO2: 38 mmol/L — ABNORMAL HIGH (ref 22–32)
Chloride: 98 mmol/L — ABNORMAL LOW (ref 101–111)
Creatinine, Ser: 0.67 mg/dL (ref 0.61–1.24)
GLUCOSE: 117 mg/dL — AB (ref 65–99)
POTASSIUM: 4.3 mmol/L (ref 3.5–5.1)
Sodium: 141 mmol/L (ref 135–145)

## 2015-09-26 LAB — AMMONIA: AMMONIA: 17 umol/L (ref 9–35)

## 2015-09-26 MED ORDER — ENSURE ENLIVE PO LIQD
237.0000 mL | Freq: Three times a day (TID) | ORAL | Status: DC
  Administered 2015-09-26 – 2015-09-27 (×3): 237 mL via ORAL

## 2015-09-26 NOTE — Clinical Social Work Note (Signed)
Clinical Social Work Assessment  Patient Details  Name: Zachary Walls MRN: [id number] Date of Birth: 06/26/1956  Date of referral:  09/26/15               Reason for consult:  Facility Placement, Substance Use/ETOH Abuse                Permission sought to share information with:  Chartered certified accountant granted to share information::  Yes, Verbal Permission Granted  Name::      Zachary Walls::     Relationship::     Contact Information:     Housing/Transportation Living arrangements for the past 2 months:  [name] of Information:  Patient Patient Interpreter Needed:  None Criminal Activity/Legal Involvement Pertinent to Current Situation/Hospitalization:  No - Comment as needed Significant Relationships:  Adult Children Lives with:  Self Do you feel safe going back to the place where you live?  Yes Need for family participation in patient care:  Yes (Comment)  Care giving concerns:  Patient lives alone in [place].    Social Worker assessment / plan:  Holiday representative (CSW) received consult to assess home situation. Per chart patient has been to [facility] in April 2015. CSW met with patient to discuss D/C plan. Patient was alert and oriented and sitting up in bed. Patient continued to fidget and touch his colostomy bag throughout assessment. CSW introduced self and explained role of CSW department. Patient reported that he lives alone in a trailer in [place]. Patient reported that his son Zachary Walls and daughter in law Zachary Walls live near him. Per patient he receives $750 per month for SSI/ disability. Patient reported that he went to [facility] in 2015 right after he got the colostomy. Patient reported that he does not remember being at [facility]. Patient reported that he is over due for a colostomy reversal. Patient reported that he has missed several doctors appointments because he does not have a car. Patient then  stated that his daughter in law Zachary Walls provides transportation when needed. Patient reported that he takes care of his own colostomy at home fairly well. CSW discussed SNF placement with patient. Patient is agreeable to SNF search and staying for 30 days under Medicaid. CSW explained that patient will be responsible for the SNF bill if he does not stay for 30 days. CSW also made patient aware that SNF search will have to extend outside of [facility]. Patient reported that he prefers to stay in [place] however he is agreeable to going outside of the county. Patient reported that he wants to go to SNF "so they can remove the colostomy." CSW explained that is not what SNF's do and patient appeared to be confused. Patient denied alcohol use and reported that he has not had a drink in 2 years. Patient reported that he does occasionally use marijuana. Patient gave CSW permission to contact his daughter in law Zachary Walls. CSW left Zachary Walls a Advertising account executive.   FL2 complete and faxed out. Psych consult pending. CSW will continue to follow and assist as needed.    Employment status:  Disabled (Comment on whether or not currently receiving Disability) (Receives $750 per month on SSI/Disability ) Insurance information:  Medicaid In [place] PT Recommendations:  Home with [place] / Referral to community resources:  [place]  Patient/Family's Response to care:  Patient is agreeable to SNF search.   Patient/Family's Understanding of and Emotional Response to  Diagnosis, Current Treatment, and Prognosis:  Patient was pleasant however appeared to not understand the situation and had hard time following the conversation at times.   Emotional Assessment Appearance:  Appears stated age Attitude/Demeanor/Rapport:    Affect (typically observed):  Pleasant Orientation:  Oriented to Self, Fluctuating Orientation (Suspected and/or reported Sundowners), Oriented to Place Alcohol / Substance  use:  Alcohol Use, Illicit Drugs Psych involvement (Current and /or in the community):  Yes (Comment) (Psych consult ordered )  Discharge Needs  Concerns to be addressed:  Discharge Planning Concerns Readmission within the last 30 days:  Yes Current discharge risk:  Lives alone, Dependent with Mobility Barriers to Discharge:  Continued Medical Work up   Loralyn Freshwater, LCSW 09/26/2015, 3:19 PM

## 2015-09-26 NOTE — Clinical Social Work Placement (Signed)
   CLINICAL SOCIAL WORK PLACEMENT  NOTE  Date:  09/26/2015  Patient Details  Name: Zachary Walls MRN: [id number] Date of Birth: 1956/01/18  Clinical Social Work is seeking post-discharge placement for this patient at the [place] level of care (*CSW will initial, date and re-position this form in  chart as items are completed):  Yes   Patient/family provided with [place] Work Department's list of facilities offering this level of care within the geographic area requested by the patient (or if unable, by the patient's family).  Yes   Patient/family informed of their freedom to choose among providers that offer the needed level of care, that participate in Medicare, Medicaid or managed care program needed by the patient, have an available bed and are willing to accept the patient.      Patient/family informed of [facility]'s ownership interest in [facility] and [facility], as well as of the fact that they are under no obligation to receive care at these facilities.  PASRR submitted to EDS on       PASRR number received on       Existing PASRR number confirmed on 09/26/15     FL2 transmitted to all facilities in geographic area requested by pt/family on 09/26/15     FL2 transmitted to all facilities within larger geographic area on 09/26/15     Patient informed that his/her managed care company has contracts with or will negotiate with certain facilities, including the following:            Patient/family informed of bed offers received.  Patient chooses bed at       Physician recommends and patient chooses bed at      Patient to be transferred to   on  .  Patient to be transferred to facility by       Patient family notified on   of transfer.  Name of family member notified:        PHYSICIAN       Additional Comment:    _______________________________________________ Loralyn Freshwater, LCSW 09/26/2015, 3:17 PM

## 2015-09-26 NOTE — Care Management (Addendum)
Patient has [place] Medicaid which will NOT pay for HHPT under current diagnosis. Please continue to evaluate need for placement/long term care or outpatient PT which can be arranged with [facility]. RNCM will follow up with patient. Patient was sleeping with O2 in place when I rounded- I did not wake him. I understand per nursing that patient a a decub on his sacrum, chronic O2, frequent falls at home, and was positive for marijuana this visit. I understand that patient is oriented but very hard of hearing. He also has a colostomy.

## 2015-09-26 NOTE — Plan of Care (Signed)
Daughter-in-law/Michelle  called and gave password to request info on pt dx and current status/care.  Also requested to have Dr. Minette Brine her to discuss needed documentation.  RN informed in report to follow up on request. Note also placed on chart to request call from Dr.

## 2015-09-26 NOTE — Progress Notes (Signed)
Initial Nutrition Assessment  DOCUMENTATION CODES:   Severe malnutrition in context of chronic illness  INTERVENTION:   Meals and Snacks: Cater to patient preferences, will add banana to dinner trays  Medical Food Supplement Therapy: will recommend Ensure Enlive po TID, each supplement provides 350 kcal and 20 grams of protein   NUTRITION DIAGNOSIS:   Malnutrition related to chronic illness as evidenced by severe depletion of body fat, severe depletion of muscle mass.  GOAL:   Patient will meet greater than or equal to 90% of their needs  MONITOR:    (Energy Intake, Anthropometrics, Digestive system, Electrolyte and Renal Profile, Skin)  REASON FOR ASSESSMENT:   Consult Poor PO  ASSESSMENT:   Pt admitted with falls and syncope. Pt with h/o colostomy and severe COPD.  Past Medical History  Diagnosis Date  . COPD (chronic obstructive pulmonary disease) ([place])   . History of pneumonia   . Hypertension     a. Normotensive 08/2013 off meds.  [name] HOH (hard of hearing)   . Protein calorie malnutrition ([place])   . ETOH abuse   . Tobacco abuse   . Diverticulitis     a. s/p colon resection and colostomy  . Cardiac arrest ([place])     a. 01/2013, felt to be 2/2 severe COPD req trach;  b. 01/2013 Echo: EF 65-70% w/o rwma.  . Difficult intubation   . CAD (coronary artery disease)     a. Nonobstructive CAD by cath 08/2013 (mod RCA, mid LAD myocardial bridge) - ECG was concerning for inf STEMI but pt ruled out. CP possibly GERD.   [name] Hyponatremia     a. During 08/2013 felt due to polydipsia.  . Polysubstance abuse     a. Tobacco, marijuana, alcoholism.  [name] Anxiety   . Shortness of breath   . Peripheral vascular disease ([place])   . Prostate cancer [facility])      Diet Order:  Diet Heart Room service appropriate?: Yes; Fluid consistency:: Thin    Current Nutrition: Pt ate 95% of breakfast this am on visit. Recorded po intake 100% of meals since admission Saturday  night.  Food/Nutrition-Related History: Pt reports poor po intake a few days PTA. Pt reports sometimes not eating because he would fall on his way to kitchen to make a meal and then would not eat. Pt reports not drinking supplements at home due to cost.   Scheduled Medications:  . atorvastatin  20 mg Oral QPM  . cyanocobalamin  500 mcg Oral Daily  . docusate sodium  100 mg Oral BID  . escitalopram  10 mg Oral Daily  . feeding supplement (ENSURE ENLIVE)  237 mL Oral TID WC  . folic acid  1 mg Oral Daily  . guaiFENesin  600 mg Oral BID  . heparin  5,000 Units Subcutaneous 3 times per day  . lactulose  20 g Oral Once  . lactulose  30 g Oral BID  . metoprolol tartrate  25 mg Oral BID  . nicotine  14 mg Transdermal Daily  . pantoprazole  40 mg Oral Daily  . [START ON 09/27/2015] predniSONE  30 mg Oral Once   And  . [START ON 09/28/2015] predniSONE  20 mg Oral Once   And  . [START ON 09/29/2015] predniSONE  10 mg Oral Once   And  . [START ON 09/30/2015] predniSONE  5 mg Oral Once  . QUEtiapine  50 mg Oral q morning - 10a  . risperiDONE  0.5 mg Oral BID  .  sucralfate  1 g Oral TID WC & HS  . thiamine  100 mg Oral Daily  . tiotropium  18 mcg Inhalation Daily    Continuous Medications:  . sodium chloride 125 mL/hr at 09/25/15 2324     Electrolyte/Renal Profile and Glucose Profile:   Recent Labs Lab 09/22/15 1930 09/23/15 1842 09/24/15 1718 09/26/15 0849  NA  --  137 138 141  K  --  4.6 4.3 4.3  CL  --  88* 91* 98*  CO2  --  48* 43* 38*  BUN  --  6 7 7   CREATININE  --  0.69 0.68 0.67  CALCIUM  --  9.1 8.6* 8.7*  MG 1.9  --   --   --   GLUCOSE  --  107* 128* 117*   Protein Profile:   Recent Labs Lab 09/23/15 1842 09/24/15 1718  ALBUMIN 3.5 3.3*    Gastrointestinal Profile: Last BM:  colostomy functional, 146mL out yesterday, 272mL out this am   Nutrition-Focused Physical Exam Findings: Nutrition-Focused physical exam completed. Findings are moderate-severe fat  depletion, severe muscle depletion, and no edema.     Weight Change: Pt reports weight loss unsure how much but thought currently weight of 106lbs. Pt reports 'they always ask me but I have no idea, I don't get on a scale at home.'   Skin:  Reviewed, no issues   Height:   Ht Readings from Last 1 Encounters:  09/24/15 5\' 7"  (1.702 m)    Weight:   Wt Readings from Last 1 Encounters:  09/26/15 105 lb (47.628 kg)    Wt Readings from Last 10 Encounters:  09/26/15 105 lb (47.628 kg)  09/23/15 114 lb (51.71 kg)  07/14/15 95 lb 9.6 oz (43.364 kg)  12/04/14 120 lb (54.432 kg)  07/05/14 104 lb (47.174 kg)  06/28/14 104 lb (47.174 kg)  01/29/14 104 lb (47.174 kg)  01/19/14 121 lb 11.2 oz (55.203 kg)  01/11/14 112 lb (50.803 kg)  12/10/13 106 lb 4.2 oz (48.2 kg)     Ideal Body Weight:   67kg  BMI:  Body mass index is 16.44 kg/(m^2).  Estimated Nutritional Needs:   Kcal:  using IBW of 67kg, BEE: 1438kcals, TEE: (IF 1.1-1.3)(AF 1.2) [id number]  Protein:  74-87g protein (1.1-1.3g/kg)  Fluid:  1675-2030mL of fluid (25-41mL/kg)   HIGH Care Level  Dwyane Luo, RD, LDN Pager [phone]
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
## 2015-09-26 NOTE — Progress Notes (Signed)
[place] at [facility]                                                                                                                                                                                            Patient Demographics   Zachary Walls, is a 59 y.o. male, DOB - May 13, 1956, [id number]  Admit date - 09/24/2015   Admitting Physician Harrie Foreman, MD  Outpatient Primary MD for the patient is Millsaps, [name], NP   LOS - 1  Subjective: pt c/o his colostomy if full, he feels anxious     Review of Systems:   CONSTITUTIONAL: No documented fever. No fatigue, weakness. No weight gain, no weight loss.  EYES: No blurry or double vision.  ENT: No tinnitus. No postnasal drip. No redness of the oropharynx.  RESPIRATORY: No cough, no wheeze, no hemoptysis. Chronic dyspnea.  CARDIOVASCULAR: No chest pain. No orthopnea. No palpitations. No syncope.  GASTROINTESTINAL: No nausea, no vomiting or diarrhea. No abdominal pain. No melena or hematochezia.  GENITOURINARY: No dysuria or hematuria.  ENDOCRINE: No polyuria or nocturia. No heat or cold intolerance.  HEMATOLOGY: No anemia. No bruising. No bleeding.  INTEGUMENTARY: No rashes. No lesions.  MUSCULOSKELETAL: No arthritis. No swelling. No gout.  NEUROLOGIC: No numbness, tingling, or ataxia. No seizure-type activity.  PSYCHIATRIC: No anxiety. No insomnia. No ADD.    Vitals:   Filed Vitals:   09/26/15 0421 09/26/15 0432 09/26/15 0735 09/26/15 0933  BP: 138/68  100/61 98/64  Pulse: 83  79 74  Temp: 97.4 F (36.3 C)  98.5 F (36.9 C)   TempSrc: Oral  Oral   Resp: 22  18   Height:      Weight:      SpO2: 91% 92% 97%     Wt Readings from Last 3 Encounters:  09/26/15 47.628 kg (105 lb)  09/23/15 51.71 kg (114 lb)  07/14/15 43.364 kg (95 lb 9.6 oz)     Intake/Output Summary (Last 24 hours) at 09/26/15 1401 Last data filed at 09/26/15 1220  Gross per 24 hour  Intake 1957.5 ml   Output   1900 ml  Net   57.5 ml    Physical Exam:   GENERAL: aapear axious,  Cachectic  HEAD, EYES, EARS, NOSE AND THROAT: Atraumatic, normocephalic. Extraocular muscles are intact. Pupils equal and reactive to light. Sclerae anicteric. No conjunctival injection. No oro-pharyngeal erythema.  NECK: Supple. There is no jugular venous distention. No bruits, no lymphadenopathy, no thyromegaly.  HEART: Regular rate and rhythm,. No murmurs, no rubs, no clicks.  LUNGS: Diminished breath sounds bilaterally without  any necessary muscle usage there is no wheezing rhonchi or rales  ABDOMEN: Soft, flat, nontender, nondistended. Has good bowel sounds. No hepatosplenomegaly appreciated.  EXTREMITIES: No evidence of any cyanosis, clubbing, or peripheral edema.  +2 pedal and radial pulses bilaterally.  NEUROLOGIC: The patient is alert, awake, and oriented x3 with no focal motor or sensory deficits appreciated bilaterally.  SKIN: Moist and warm with no rashes appreciated.  Psych: Not anxious, depressed LN: No inguinal LN enlargement    Antibiotics   Anti-infectives    None      Medications   Scheduled Meds: . atorvastatin  20 mg Oral QPM  . cyanocobalamin  500 mcg Oral Daily  . docusate sodium  100 mg Oral BID  . escitalopram  10 mg Oral Daily  . feeding supplement (ENSURE ENLIVE)  237 mL Oral TID WC  . folic acid  1 mg Oral Daily  . guaiFENesin  600 mg Oral BID  . heparin  5,000 Units Subcutaneous 3 times per day  . lactulose  20 g Oral Once  . lactulose  30 g Oral BID  . metoprolol tartrate  25 mg Oral BID  . nicotine  14 mg Transdermal Daily  . pantoprazole  40 mg Oral Daily  . [START ON 09/27/2015] predniSONE  30 mg Oral Once   And  . [START ON 09/28/2015] predniSONE  20 mg Oral Once   And  . [START ON 09/29/2015] predniSONE  10 mg Oral Once   And  . [START ON 09/30/2015] predniSONE  5 mg Oral Once  . QUEtiapine  50 mg Oral q morning - 10a  . risperiDONE  0.5 mg Oral BID  .  sucralfate  1 g Oral TID WC & HS  . thiamine  100 mg Oral Daily  . tiotropium  18 mcg Inhalation Daily   Continuous Infusions: . sodium chloride 125 mL/hr at 09/25/15 2324   PRN Meds:.albuterol, diazepam, diphenhydrAMINE, ipratropium-albuterol, nitroGLYCERIN, ondansetron **OR** ondansetron (ZOFRAN) IV   Data Review:   Micro Results No results found for this or any previous visit (from the past 240 hour(s)).  Radiology Reports Dg Chest 2 View  09/23/2015  CLINICAL DATA:  Fall with near syncopal episode today. Multiple recent falls. Initial encounter. EXAM: CHEST  2 VIEW COMPARISON:  07/12/2015 and 12/08/2014 radiographs.  CT 01/29/2014. FINDINGS: The heart size and mediastinal contours are stable. The lungs are hyperinflated. There is new irregular density at the right apex which is not typical for acute inflammation and may reflect postinflammatory scarring or a developing mass. There are multiple old right-sided rib and clavicle fractures. No acute fractures, significant pleural effusion or pneumothorax demonstrated. IMPRESSION: 1. New right apical density could reflect post inflammatory scarring, although a developing mass cannot be excluded. Further evaluation with chest CT recommended to exclude neoplasm. 2. No other significant changes identified. Chronic obstructive lung disease with old right-sided rib and clavicle fractures. Electronically Signed   By: Richardean Sale M.D.   On: 09/23/2015 18:36   Ct Head Wo Contrast  09/23/2015  CLINICAL DATA:  Fall, near syncope. EXAM: CT HEAD WITHOUT CONTRAST CT CERVICAL SPINE WITHOUT CONTRAST TECHNIQUE: Multidetector CT imaging of the head and cervical spine was performed following the standard protocol without intravenous contrast. Multiplanar CT image reconstructions of the cervical spine were also generated. COMPARISON:  Head CT dated 11/22/2014. FINDINGS: CT HEAD FINDINGS Ventricles remain normal in size and configuration. All areas of the brain  demonstrate grossly normal gray-white matter attenuation. There is no  mass, hemorrhage, edema, or other evidence of acute parenchymal abnormality. No extra-axial hemorrhage. No osseous abnormality. Paranasal sinuses and mastoid air cells are clear. Superficial soft tissues are unremarkable. CT CERVICAL SPINE FINDINGS Minimal degenerative change noted within the cervical spine. Osseous alignment is normal. No fracture line or displaced fracture fragment identified. Posterior elements appear intact and well aligned throughout. Paravertebral soft tissues are unremarkable. Large lucency at the right lung apex is presumably severe bullous change related to underlying emphysema. Additional milder emphysematous change within the left lung apex. IMPRESSION: 1. Normal head CT. No intracranial mass, hemorrhage, or edema. No fracture. 2. No fracture or dislocation within the cervical spine. 3. Large area of hyperlucency at the right lung apex, incompletely imaged at the lower aspects of the cervical spine CT. This is most likely severe bullous change related to underlying emphysema but would consider chest x-ray to exclude the less likely possibility of pneumothorax. Electronically Signed   By: Franki Cabot M.D.   On: 09/23/2015 19:53   Ct Chest Wo Contrast  09/23/2015  CLINICAL DATA:  Syncope, shortness of breath, cough EXAM: CT CHEST WITHOUT CONTRAST TECHNIQUE: Multidetector CT imaging of the chest was performed following the standard protocol without IV contrast. COMPARISON:  CT chest 01/29/2014 FINDINGS: The central airways are patent. There is bilateral centrilobular and paraseptal emphysema. There is severe bullous disease at the right lung apex. There is a bandlike area of nodular airspace disease in the right upper lobe corresponding to the chest x-ray abnormality with a dominant nodular component along the inferior margin measuring 12.5 mm. There is no other pulmonary mass, focal consolidation or nodule. There is  no pleural effusion or pneumothorax. There are no pathologically enlarged axillary, hilar or mediastinal lymph nodes. The heart size is normal. There is coronary artery atherosclerosis in the left main and RCA. There is no pericardial effusion. The thoracic aorta is normal in caliber. Review of bone windows demonstrates no focal lytic or sclerotic lesions. There is an old healed right fifth lateral rib fracture. Limited non-contrast images of the upper abdomen were obtained. The adrenal glands appear normal. The remainder of the visualized abdominal organs are unremarkable. IMPRESSION: 1. No acute cardiopulmonary process. 2. Bandlike area of nodular airspace disease in the right upper lobe corresponding to the chest x-ray abnormality. The appearance is likely secondary to post inflammatory scarring but there is a dominant 12.5 mm nodule along the inferior margin. Recommend follow-up CT in 3 months to ensure stability. 3. Severe bilateral emphysematous changes with bullous disease at the right lung apex. Electronically Signed   By: Kathreen Devoid   On: 09/23/2015 20:01   Ct Cervical Spine Wo Contrast  09/23/2015  CLINICAL DATA:  Fall, near syncope. EXAM: CT HEAD WITHOUT CONTRAST CT CERVICAL SPINE WITHOUT CONTRAST TECHNIQUE: Multidetector CT imaging of the head and cervical spine was performed following the standard protocol without intravenous contrast. Multiplanar CT image reconstructions of the cervical spine were also generated. COMPARISON:  Head CT dated 11/22/2014. FINDINGS: CT HEAD FINDINGS Ventricles remain normal in size and configuration. All areas of the brain demonstrate grossly normal gray-white matter attenuation. There is no mass, hemorrhage, edema, or other evidence of acute parenchymal abnormality. No extra-axial hemorrhage. No osseous abnormality. Paranasal sinuses and mastoid air cells are clear. Superficial soft tissues are unremarkable. CT CERVICAL SPINE FINDINGS Minimal degenerative change noted  within the cervical spine. Osseous alignment is normal. No fracture line or displaced fracture fragment identified. Posterior elements appear intact and well aligned throughout.  Paravertebral soft tissues are unremarkable. Large lucency at the right lung apex is presumably severe bullous change related to underlying emphysema. Additional milder emphysematous change within the left lung apex. IMPRESSION: 1. Normal head CT. No intracranial mass, hemorrhage, or edema. No fracture. 2. No fracture or dislocation within the cervical spine. 3. Large area of hyperlucency at the right lung apex, incompletely imaged at the lower aspects of the cervical spine CT. This is most likely severe bullous change related to underlying emphysema but would consider chest x-ray to exclude the less likely possibility of pneumothorax. Electronically Signed   By: Franki Cabot M.D.   On: 09/23/2015 19:53     CBC  Recent Labs Lab 09/23/15 1842 09/24/15 1718  WBC 8.3 8.5  HGB 13.1 12.0*  HCT 40.3 37.6*  PLT 230 216  MCV 91.2 91.1  MCH 29.7 29.0  MCHC 32.5 31.9*  RDW 14.7* 14.7*  LYMPHSABS 1.9 2.2  MONOABS 0.8 1.0  EOSABS 0.1 0.2  BASOSABS 0.0 0.1    Chemistries   Recent Labs Lab 09/22/15 1930 09/23/15 1842 09/24/15 1718 09/26/15 0849  NA  --  137 138 141  K  --  4.6 4.3 4.3  CL  --  88* 91* 98*  CO2  --  48* 43* 38*  GLUCOSE  --  107* 128* 117*  BUN  --  6 7 7   CREATININE  --  0.69 0.68 0.67  CALCIUM  --  9.1 8.6* 8.7*  MG 1.9  --   --   --   AST  --  16 15  --   ALT  --  8* 8*  --   ALKPHOS  --  71 64  --   BILITOT  --  0.7 <0.1*  --    ------------------------------------------------------------------------------------------------------------------ estimated creatinine clearance is 66.9 mL/min (by C-G formula based on Cr of 0.67). ------------------------------------------------------------------------------------------------------------------  Recent Labs  09/24/15 1718  HGBA1C 5.1    ------------------------------------------------------------------------------------------------------------------ No results for input(s): CHOL, HDL, LDLCALC, TRIG, CHOLHDL, LDLDIRECT in the last 72 hours. ------------------------------------------------------------------------------------------------------------------  Recent Labs  09/24/15 1718  TSH 0.968   ------------------------------------------------------------------------------------------------------------------ No results for input(s): VITAMINB12, FOLATE, FERRITIN, TIBC, IRON, RETICCTPCT in the last 72 hours.  Coagulation profile No results for input(s): INR, PROTIME in the last 168 hours.  No results for input(s): DDIMER in the last 72 hours.  Cardiac Enzymes  Recent Labs Lab 09/23/15 1842 09/24/15 1718  TROPONINI <0.03 <0.03   ------------------------------------------------------------------------------------------------------------------ Invalid input(s): POCBNP    Assessment & Plan   AAssessment/Plan This is a 59 year old Caucasian male admitted for encephalopathy likely multifactorial in nature  1. Encephalopathy: Possibly due to elevated ammonia level , also could be related to him severe COPD and intermittent hypoxia pt ammonia level better 2. COPD: The patient is on oxygen at home 24/7. Continue supplemental oxygen to maintain oxygen saturations 88-94%. Albuterol when necessary. Combivent scheduled. Continue prednisone  3. Hyponatremia: Likely related to lung disease and suspected alcohol abuse. Normal saline 4. Falls: Physical therapy and occupational therapy eval ordered 5. Hypertension: Controlled, continue metoprolol and valsartan 6. Malnutrition: Dietary consultation ordered 7. Depression: Continue Lexapro 8. DVT prophylaxis: Heparin 9. GI prophylaxis: H2 blocker per home regimen (likely secondary to history of gastritis due to alcohol      Code Status Orders        Start     Ordered    09/25/15 0153  Full code   Continuous     09/25/15 0152    Advance Directive  Documentation        Most Recent Value   Type of Advance Directive  Healthcare Power of Attorney   Pre-existing out of facility DNR order (yellow form or pink MOST form)     "MOST" Form in Place?              DVT Prophylaxis  heparin  Lab Results  Component Value Date   PLT 216 09/24/2015     Time Spent in minutes  56min   Dustin Flock M.D on 09/26/2015 at 2:01 PM  Between 7am to 6pm - Pager - [phone]  After 6pm go to [email or web address] - password [email or web address] [facility]   Office  [phone]

## 2015-09-26 NOTE — Care Management Note (Addendum)
Case Management Note  Patient Details  Name: ADIT RIDDLES MRN: [id number] Date of Birth: 06-Jan-1956  Subjective/Objective:   Met with patient to discuss discharge planning. Patient lives at home alone. He has a daughter and daughter in law that he states can help him some. He states he used to have food stamps assistance and other assistance from a SW but he states this is not happening now and he doesn't know why. He is on disability and makes approximately $725 per month. He reports multiple falls even with the use of his walker. He has chronic home O2 but continues to smoke marijuana and cigarettes. He is hard of hearing, has a colostomy and a sacral decubitus.   Patient is open to having help whether that be placement or home health. Discussed with CSW who will assess needs further.  He is followed by Everardo Beals , NP outpatient. He does reports it is difficult for him to get to her office.                 Action/Plan:   Expected Discharge Date:                  Expected Discharge Plan:     In-House Referral:  Clinical Social Work  Discharge planning Services  CM Consult  Post Acute Care Choice:  Home Health Choice offered to:  Patient  DME Arranged:    DME Agency:     HH Arranged:    [place] Agency:     Status of Service:  In process, will continue to follow  Medicare Important Message Given:    Date Medicare IM Given:    Medicare IM give by:    Date Additional Medicare IM Given:    Additional Medicare Important Message give by:     If discussed at [place] of Stay Meetings, dates discussed:    Additional Comments:  Jolly Mango, RN 09/26/2015, 2:26 PM

## 2015-09-27 LAB — CBC
HCT: 36.1 % — ABNORMAL LOW (ref 40.0–52.0)
Hemoglobin: 11.7 g/dL — ABNORMAL LOW (ref 13.0–18.0)
MCH: 29.9 pg (ref 26.0–34.0)
MCHC: 32.5 g/dL (ref 32.0–36.0)
MCV: 91.8 fL (ref 80.0–100.0)
Platelets: 176 10*3/uL (ref 150–440)
RBC: 3.93 MIL/uL — ABNORMAL LOW (ref 4.40–5.90)
RDW: 15 % — ABNORMAL HIGH (ref 11.5–14.5)
WBC: 8.2 10*3/uL (ref 3.8–10.6)

## 2015-09-27 MED ORDER — OXYCODONE-ACETAMINOPHEN 5-325 MG PO TABS: 1.0000 | ORAL_TABLET | Freq: Three times a day (TID) | ORAL | Status: AC | PRN

## 2015-09-27 MED ORDER — DIAZEPAM 5 MG PO TABS: 5.0000 mg | ORAL_TABLET | Freq: Three times a day (TID) | ORAL | Status: DC

## 2015-09-27 MED ORDER — ENSURE ENLIVE PO LIQD: 237.0000 mL | Freq: Three times a day (TID) | ORAL | Status: DC

## 2015-09-27 MED ORDER — GUAIFENESIN ER 600 MG PO TB12: 600.0000 mg | ORAL_TABLET | Freq: Two times a day (BID) | ORAL | Status: DC
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
## 2015-09-27 NOTE — Discharge Instructions (Signed)
°  DIET:  Regular diet  DISCHARGE CONDITION:  Stable  ACTIVITY:  Activity as tolerated  OXYGEN:  Home Oxygen: Yes.     Oxygen Delivery: 2 liters/min via Patient connected to nasal cannula oxygen  DISCHARGE LOCATION:  nursing home    ADDITIONAL DISCHARGE INSTRUCTION:   If you experience worsening of your admission symptoms, develop shortness of breath, life threatening emergency, suicidal or homicidal thoughts you must seek medical attention immediately by calling 911 or calling your MD immediately  if symptoms less severe.  You Must read complete instructions/literature along with all the possible adverse reactions/side effects for all the Medicines you take and that have been prescribed to you. Take any new Medicines after you have completely understood and accpet all the possible adverse reactions/side effects.   Please note  You were cared for by a hospitalist during your hospital stay. If you have any questions about your discharge medications or the care you received while you were in the hospital after you are discharged, you can call the unit and asked to speak with the hospitalist on call if the hospitalist that took care of you is not available. Once you are discharged, your primary care physician will handle any further medical issues. Please note that NO REFILLS for any discharge medications will be authorized once you are discharged, as it is imperative that you return to your primary care physician (or establish a relationship with a primary care physician if you do not have one) for your aftercare needs so that they can reassess your need for medications and monitor your lab values.

## 2015-09-27 NOTE — Progress Notes (Signed)
Patient discharging to [facility]. Report called to Ramona at facility. EMS called. Waiting on transport.

## 2015-09-27 NOTE — Clinical Social Work Placement (Signed)
   CLINICAL SOCIAL WORK PLACEMENT  NOTE  Date:  09/27/2015  Patient Details  Name: Zachary Walls MRN: [id number] Date of Birth: 1956-09-11  Clinical Social Work is seeking post-discharge placement for this patient at the [place] level of care (*CSW will initial, date and re-position this form in  chart as items are completed):  Yes   Patient/family provided with [facility] Work Department's list of facilities offering this level of care within the geographic area requested by the patient (or if unable, by the patient's family).  Yes   Patient/family informed of their freedom to choose among providers that offer the needed level of care, that participate in Medicare, Medicaid or managed care program needed by the patient, have an available bed and are willing to accept the patient.      Patient/family informed of [facility]'s ownership interest in [facility] and [facility], as well as of the fact that they are under no obligation to receive care at these facilities.  PASRR submitted to [employer] on       PASRR number received on       Existing PASRR number confirmed on 09/26/15     FL2 transmitted to all facilities in geographic area requested by pt/family on 09/26/15     FL2 transmitted to all facilities within larger geographic area on 09/26/15     Patient informed that his/her managed care company has contracts with or will negotiate with certain facilities, including the following:        Yes   Patient/family informed of bed offers received.  Patient chooses bed at  [facility] )     Physician recommends and patient chooses bed at      Patient to be transferred to  [facility] ) on 09/27/15.  Patient to be transferred to facility by  [facility])     Patient family notified on 09/27/15 of transfer.  Name of family member notified:   (Voicemail was left for patient's daughter in [place]. )     PHYSICIAN        Additional Comment:    _______________________________________________ Loralyn Freshwater, LCSW 09/27/2015, 11:47 AM
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
## 2015-09-28 LAB — METHYLMALONIC ACID, SERUM: Methylmalonic Acid, Quantitative: 1475 nmol/L — ABNORMAL HIGH (ref 0–378)

## 2015-10-08 ENCOUNTER — Emergency Department (HOSPITAL_COMMUNITY): Payer: Medicaid Other

## 2015-10-08 ENCOUNTER — Encounter (HOSPITAL_COMMUNITY): Payer: Self-pay

## 2015-10-08 ENCOUNTER — Inpatient Hospital Stay (HOSPITAL_COMMUNITY)
Admission: EM | Admit: 2015-10-08 | Discharge: 2015-10-20 | DRG: 870 | Disposition: A | Discharge status: Hospice | Payer: Medicaid Other | Attending: Internal Medicine | Admitting: Internal Medicine

## 2015-10-08 DIAGNOSIS — K219 Gastro-esophageal reflux disease without esophagitis: Secondary | ICD-10-CM | POA: Diagnosis present

## 2015-10-08 DIAGNOSIS — F10231 Alcohol dependence with withdrawal delirium: Secondary | ICD-10-CM | POA: Diagnosis present

## 2015-10-08 DIAGNOSIS — I739 Peripheral vascular disease, unspecified: Secondary | ICD-10-CM | POA: Diagnosis present

## 2015-10-08 DIAGNOSIS — Y95 Nosocomial condition: Secondary | ICD-10-CM | POA: Diagnosis present

## 2015-10-08 DIAGNOSIS — H9190 Unspecified hearing loss, unspecified ear: Secondary | ICD-10-CM | POA: Diagnosis present

## 2015-10-08 DIAGNOSIS — Z9289 Personal history of other medical treatment: Secondary | ICD-10-CM

## 2015-10-08 DIAGNOSIS — I2510 Atherosclerotic heart disease of native coronary artery without angina pectoris: Secondary | ICD-10-CM | POA: Diagnosis present

## 2015-10-08 DIAGNOSIS — Z23 Encounter for immunization: Secondary | ICD-10-CM

## 2015-10-08 DIAGNOSIS — Z681 Body mass index (BMI) 19 or less, adult: Secondary | ICD-10-CM

## 2015-10-08 DIAGNOSIS — R001 Bradycardia, unspecified: Secondary | ICD-10-CM | POA: Diagnosis not present

## 2015-10-08 DIAGNOSIS — Z8546 Personal history of malignant neoplasm of prostate: Secondary | ICD-10-CM

## 2015-10-08 DIAGNOSIS — E872 Acidosis: Secondary | ICD-10-CM | POA: Diagnosis present

## 2015-10-08 DIAGNOSIS — E876 Hypokalemia: Secondary | ICD-10-CM | POA: Diagnosis not present

## 2015-10-08 DIAGNOSIS — A419 Sepsis, unspecified organism: Secondary | ICD-10-CM | POA: Diagnosis present

## 2015-10-08 DIAGNOSIS — J440 Chronic obstructive pulmonary disease with acute lower respiratory infection: Secondary | ICD-10-CM | POA: Diagnosis present

## 2015-10-08 DIAGNOSIS — G9340 Encephalopathy, unspecified: Secondary | ICD-10-CM | POA: Diagnosis present

## 2015-10-08 DIAGNOSIS — R6520 Severe sepsis without septic shock: Secondary | ICD-10-CM

## 2015-10-08 DIAGNOSIS — Z9911 Dependence on respirator [ventilator] status: Secondary | ICD-10-CM

## 2015-10-08 DIAGNOSIS — R1310 Dysphagia, unspecified: Secondary | ICD-10-CM

## 2015-10-08 DIAGNOSIS — K5792 Diverticulitis of intestine, part unspecified, without perforation or abscess without bleeding: Secondary | ICD-10-CM | POA: Diagnosis present

## 2015-10-08 DIAGNOSIS — J449 Chronic obstructive pulmonary disease, unspecified: Secondary | ICD-10-CM | POA: Diagnosis present

## 2015-10-08 DIAGNOSIS — R402432 Glasgow coma scale score 3-8, at arrival to emergency department: Secondary | ICD-10-CM | POA: Diagnosis present

## 2015-10-08 DIAGNOSIS — F17210 Nicotine dependence, cigarettes, uncomplicated: Secondary | ICD-10-CM | POA: Diagnosis present

## 2015-10-08 DIAGNOSIS — E785 Hyperlipidemia, unspecified: Secondary | ICD-10-CM | POA: Diagnosis present

## 2015-10-08 DIAGNOSIS — R6521 Severe sepsis with septic shock: Secondary | ICD-10-CM | POA: Diagnosis present

## 2015-10-08 DIAGNOSIS — T4275XA Adverse effect of unspecified antiepileptic and sedative-hypnotic drugs, initial encounter: Secondary | ICD-10-CM | POA: Diagnosis not present

## 2015-10-08 DIAGNOSIS — I10 Essential (primary) hypertension: Secondary | ICD-10-CM | POA: Diagnosis present

## 2015-10-08 DIAGNOSIS — R680 Hypothermia, not associated with low environmental temperature: Secondary | ICD-10-CM | POA: Diagnosis present

## 2015-10-08 DIAGNOSIS — E43 Unspecified severe protein-calorie malnutrition: Secondary | ICD-10-CM | POA: Diagnosis present

## 2015-10-08 DIAGNOSIS — T380X5A Adverse effect of glucocorticoids and synthetic analogues, initial encounter: Secondary | ICD-10-CM | POA: Diagnosis present

## 2015-10-08 DIAGNOSIS — Z789 Other specified health status: Secondary | ICD-10-CM | POA: Diagnosis not present

## 2015-10-08 DIAGNOSIS — Z515 Encounter for palliative care: Secondary | ICD-10-CM | POA: Diagnosis not present

## 2015-10-08 DIAGNOSIS — Z66 Do not resuscitate: Secondary | ICD-10-CM | POA: Diagnosis present

## 2015-10-08 DIAGNOSIS — J9620 Acute and chronic respiratory failure, unspecified whether with hypoxia or hypercapnia: Secondary | ICD-10-CM | POA: Diagnosis present

## 2015-10-08 DIAGNOSIS — F39 Unspecified mood [affective] disorder: Secondary | ICD-10-CM | POA: Diagnosis present

## 2015-10-08 DIAGNOSIS — Z933 Colostomy status: Secondary | ICD-10-CM

## 2015-10-08 DIAGNOSIS — F419 Anxiety disorder, unspecified: Secondary | ICD-10-CM | POA: Diagnosis present

## 2015-10-08 DIAGNOSIS — I119 Hypertensive heart disease without heart failure: Secondary | ICD-10-CM | POA: Diagnosis present

## 2015-10-08 DIAGNOSIS — R64 Cachexia: Secondary | ICD-10-CM | POA: Diagnosis present

## 2015-10-08 DIAGNOSIS — Z8674 Personal history of sudden cardiac arrest: Secondary | ICD-10-CM

## 2015-10-08 DIAGNOSIS — J9601 Acute respiratory failure with hypoxia: Secondary | ICD-10-CM

## 2015-10-08 DIAGNOSIS — J189 Pneumonia, unspecified organism: Secondary | ICD-10-CM

## 2015-10-08 DIAGNOSIS — Z9981 Dependence on supplemental oxygen: Secondary | ICD-10-CM

## 2015-10-08 DIAGNOSIS — R339 Retention of urine, unspecified: Secondary | ICD-10-CM | POA: Diagnosis not present

## 2015-10-08 DIAGNOSIS — J9622 Acute and chronic respiratory failure with hypercapnia: Secondary | ICD-10-CM | POA: Diagnosis present

## 2015-10-08 DIAGNOSIS — J9621 Acute and chronic respiratory failure with hypoxia: Secondary | ICD-10-CM | POA: Diagnosis present

## 2015-10-08 DIAGNOSIS — N179 Acute kidney failure, unspecified: Secondary | ICD-10-CM | POA: Diagnosis not present

## 2015-10-08 DIAGNOSIS — T17890A Other foreign object in other parts of respiratory tract causing asphyxiation, initial encounter: Secondary | ICD-10-CM | POA: Diagnosis not present

## 2015-10-08 DIAGNOSIS — R7989 Other specified abnormal findings of blood chemistry: Secondary | ICD-10-CM | POA: Diagnosis present

## 2015-10-08 DIAGNOSIS — J441 Chronic obstructive pulmonary disease with (acute) exacerbation: Secondary | ICD-10-CM | POA: Diagnosis present

## 2015-10-08 DIAGNOSIS — J9600 Acute respiratory failure, unspecified whether with hypoxia or hypercapnia: Secondary | ICD-10-CM | POA: Diagnosis present

## 2015-10-08 DIAGNOSIS — Z4659 Encounter for fitting and adjustment of other gastrointestinal appliance and device: Secondary | ICD-10-CM

## 2015-10-08 LAB — COMPREHENSIVE METABOLIC PANEL
ALK PHOS: 67 U/L (ref 38–126)
ALT: 19 U/L (ref 17–63)
AST: 30 U/L (ref 15–41)
Albumin: 2.9 g/dL — ABNORMAL LOW (ref 3.5–5.0)
Anion gap: 10 (ref 5–15)
BUN: 31 mg/dL — AB (ref 6–20)
CALCIUM: 9.4 mg/dL (ref 8.9–10.3)
CHLORIDE: 89 mmol/L — AB (ref 101–111)
CO2: 44 mmol/L — AB (ref 22–32)
CREATININE: 1.13 mg/dL (ref 0.61–1.24)
GFR calc non Af Amer: >60 mL/min (ref >60)
Glucose, Bld: 184 mg/dL — ABNORMAL HIGH (ref 65–99)
Potassium: 5.4 mmol/L — ABNORMAL HIGH (ref 3.5–5.1)
SODIUM: 143 mmol/L (ref 135–145)
Total Bilirubin: 1 mg/dL (ref 0.3–1.2)
Total Protein: 7 g/dL (ref 6.5–8.1)

## 2015-10-08 LAB — CBC WITH DIFFERENTIAL/PLATELET
BASOS ABS: 0 10*3/uL (ref 0.0–0.1)
Basophils Relative: 0 %
EOS ABS: 0 10*3/uL (ref 0.0–0.7)
EOS PCT: 0 %
HCT: 38.8 % — ABNORMAL LOW (ref 39.0–52.0)
HEMOGLOBIN: 11.2 g/dL — AB (ref 13.0–17.0)
LYMPHS ABS: 2.3 10*3/uL (ref 0.7–4.0)
LYMPHS PCT: 18 %
MCH: 29.6 pg (ref 26.0–34.0)
MCHC: 28.9 g/dL — ABNORMAL LOW (ref 30.0–36.0)
MCV: 102.4 fL — AB (ref 78.0–100.0)
Monocytes Absolute: 1.1 10*3/uL — ABNORMAL HIGH (ref 0.1–1.0)
Monocytes Relative: 8 %
NEUTROS PCT: 74 %
Neutro Abs: 9.3 10*3/uL — ABNORMAL HIGH (ref 1.7–7.7)
PLATELETS: 233 10*3/uL (ref 150–400)
RBC: 3.79 MIL/uL — AB (ref 4.22–5.81)
RDW: 13.9 % (ref 11.5–15.5)
WBC: 12.7 10*3/uL — AB (ref 4.0–10.5)

## 2015-10-08 LAB — I-STAT ARTERIAL BLOOD GAS, ED
Acid-Base Excess: 18 mmol/L — ABNORMAL HIGH (ref 0.0–2.0)
Bicarbonate: 46.8 mEq/L — ABNORMAL HIGH (ref 20.0–24.0)
O2 Saturation: 45 %
PCO2 ART: 79.5 mmHg — AB (ref 35.0–45.0)
PH ART: 7.378 (ref 7.350–7.450)
TCO2: 49 mmol/L (ref 0–100)
pO2, Arterial: 27 mmHg — CL (ref 80.0–100.0)

## 2015-10-08 LAB — TROPONIN I: Troponin I: 0.04 ng/mL — ABNORMAL HIGH (ref <0.031)

## 2015-10-08 LAB — URINALYSIS, ROUTINE W REFLEX MICROSCOPIC
BILIRUBIN URINE: NEGATIVE
GLUCOSE, UA: NEGATIVE mg/dL
KETONES UR: NEGATIVE mg/dL
Leukocytes, UA: NEGATIVE
NITRITE: NEGATIVE
PH: 5.5 (ref 5.0–8.0)
Protein, ur: 100 mg/dL — AB
SPECIFIC GRAVITY, URINE: 1.022 (ref 1.005–1.030)

## 2015-10-08 LAB — RAPID URINE DRUG SCREEN, HOSP PERFORMED
AMPHETAMINES: NOT DETECTED
BARBITURATES: NOT DETECTED
BENZODIAZEPINES: POSITIVE — AB
COCAINE: NOT DETECTED
Opiates: NOT DETECTED
TETRAHYDROCANNABINOL: NOT DETECTED

## 2015-10-08 LAB — URINE MICROSCOPIC-ADD ON

## 2015-10-08 LAB — I-STAT CG4 LACTIC ACID, ED: Lactic Acid, Venous: 8 mmol/L (ref 0.5–2.0)

## 2015-10-08 LAB — ACETAMINOPHEN LEVEL: Acetaminophen (Tylenol), Serum: <10 ug/mL — ABNORMAL LOW (ref 10–30)

## 2015-10-08 LAB — CBG MONITORING, ED: Glucose-Capillary: 138 mg/dL — ABNORMAL HIGH (ref 65–99)

## 2015-10-08 LAB — SALICYLATE LEVEL

## 2015-10-08 MED ORDER — FENTANYL CITRATE (PF) 100 MCG/2ML IJ SOLN: 100.0000 ug | INTRAMUSCULAR | Status: DC | PRN

## 2015-10-08 MED ORDER — SODIUM CHLORIDE 0.9 % IV SOLN
INTRAVENOUS | Status: AC | PRN
  Administered 2015-10-08: 1000 mL via INTRAVENOUS

## 2015-10-08 MED ORDER — FENTANYL CITRATE (PF) 100 MCG/2ML IJ SOLN
100.0000 ug | INTRAMUSCULAR | Status: AC | PRN
  Administered 2015-10-08 – 2015-10-09 (×2): 100 ug via INTRAVENOUS
  Administered 2015-10-10: 50 ug via INTRAVENOUS
  Filled 2015-10-08 (×2): qty 2

## 2015-10-08 MED ORDER — MIDAZOLAM HCL 2 MG/2ML IJ SOLN
2.0000 mg | INTRAMUSCULAR | Status: AC | PRN
  Administered 2015-10-08 – 2015-10-10 (×3): 2 mg via INTRAVENOUS
  Filled 2015-10-08 (×3): qty 2

## 2015-10-08 MED ORDER — ETOMIDATE 2 MG/ML IV SOLN
INTRAVENOUS | Status: AC | PRN
  Administered 2015-10-08: 20 mg via INTRAVENOUS

## 2015-10-08 MED ORDER — SODIUM CHLORIDE 0.9 % IV BOLUS (SEPSIS)
1000.0000 mL | Freq: Once | INTRAVENOUS | Status: AC
  Administered 2015-10-08: 1000 mL via INTRAVENOUS

## 2015-10-08 MED ORDER — ROCURONIUM BROMIDE 50 MG/5ML IV SOLN
INTRAVENOUS | Status: AC | PRN
  Administered 2015-10-08: 50 mg via INTRAVENOUS

## 2015-10-08 MED ORDER — SODIUM CHLORIDE 0.9 % IV BOLUS (SEPSIS)
1000.0000 mL | Freq: Once | INTRAVENOUS | Status: AC
  Administered 2015-10-09: 1000 mL via INTRAVENOUS

## 2015-10-08 MED ORDER — MIDAZOLAM HCL 2 MG/2ML IJ SOLN
2.0000 mg | INTRAMUSCULAR | Status: DC | PRN
  Administered 2015-10-09 – 2015-10-12 (×9): 2 mg via INTRAVENOUS
  Filled 2015-10-08 (×9): qty 2

## 2015-10-08 NOTE — ED Notes (Signed)
Oxygen saturation in 50's on arrival to ed, tube to be replaced by edp. Cap reading 70's right now.

## 2015-10-08 NOTE — ED Provider Notes (Signed)
CSN: [id number]     Arrival date & time 10/08/15  2159 History   First MD Initiated Contact with Patient 10/08/15 2225     Chief Complaint  Patient presents with  . Respiratory Arrest   59 year old Caucasian male with history of COPD, ethanol abuse, cardiac arrest in 2014, CAD, diverticulitis status post bowel resection and ostomy. Presents by EMS for respiratory distress from snf. Reportedly, patient with agonal breathing when EMS arrived. Pulses still intact. EMS attempted intubation in the field and brought to ED. Due to patient's intubation and critical status all other history is unobtainable.  Patient is a 59 y.o. male presenting with shortness of breath.  Shortness of Breath Severity:  Severe Onset quality:  Sudden Timing:  Constant Progression:  Worsening Chronicity:  New Relieved by:  Nothing Worsened by:  Nothing tried Ineffective treatments:  None tried   Past Medical History  Diagnosis Date  . COPD (chronic obstructive pulmonary disease) ([place])   . History of pneumonia   . Hypertension     a. Normotensive 08/2013 off meds.  [name] HOH (hard of hearing)   . Protein calorie malnutrition ([place])   . ETOH abuse   . Tobacco abuse   . Diverticulitis     a. s/p colon resection and colostomy  . Cardiac arrest ([place])     a. 01/2013, felt to be 2/2 severe COPD req trach;  b. 01/2013 Echo: EF 65-70% w/o rwma.  . Difficult intubation   . CAD (coronary artery disease)     a. Nonobstructive CAD by cath 08/2013 (mod RCA, mid LAD myocardial bridge) - ECG was concerning for inf STEMI but pt ruled out. CP possibly GERD.   [name] Hyponatremia     a. During 08/2013 felt due to polydipsia.  . Polysubstance abuse     a. Tobacco, marijuana, alcoholism.  [name] Anxiety   . Shortness of breath   . Peripheral vascular disease ([place])   . Prostate cancer [facility])    Past Surgical History  Procedure Laterality Date  . Hernia repair      inguinal  . Colostomy  12/11/2011    Procedure: COLOSTOMY;  Surgeon:  Rolm Bookbinder, MD;  Location: [facility];  Service: General;  Laterality: N/A;  . Colostomy revision  12/11/2011    Procedure: COLON RESECTION SIGMOID;  Surgeon: Rolm Bookbinder, MD;  Location: [facility];  Service: General;;  . Bowel resection  12/11/2011    Procedure: SMALL BOWEL RESECTION;  Surgeon: Rolm Bookbinder, MD;  Location: [facility];  Service: General;;  times 2  . Cystoscopy w/ ureteral stent placement  12/11/2011    Procedure: CYSTOSCOPY WITH STENT REPLACEMENT;  Surgeon: Malka So, MD;  Location: [facility];  Service: Urology;  Laterality: Left;  ureteral catheter placement  . Cardiac catheterization  08/25/2013  . Tracheostomy  01/2013    emergent d/t difficult intubation  . Colon surgery    . Tracheostomy closure    . Left heart catheterization with coronary angiogram N/A 08/25/2013    Procedure: LEFT HEART CATHETERIZATION WITH CORONARY ANGIOGRAM;  Surgeon: Wellington Hampshire, MD;  Location: [facility];  Service: Cardiovascular;  Laterality: N/A;   Family History  Problem Relation Age of Onset  . Heart attack Father   . Cancer Father     unknown type  . HIV Brother    Social History  Substance Use Topics  . Smoking status: Current Every Day Smoker -- 0.50 packs/day for 44 years    Types: Cigarettes  Last Attempt to Quit: 11/03/2011  . Smokeless tobacco: Never Used     Comment: Has smoked as much as 2-3 packs/day.  Now can make a pack last a week.  . Alcohol Use: Yes     Comment: Previously drank heavily.  Admits to drinking 1-3 40 oz beers most days of the week.    Review of Systems  Unable to perform ROS: Intubated  Respiratory: Positive for shortness of breath.       Allergies  Bee venom; Shellfish allergy; and Codeine  Home Medications   Prior to Admission medications   Medication Sig Start Date End Date Taking? Authorizing Provider  albuterol (PROVENTIL) (2.5 MG/3ML) 0.083% nebulizer solution Take 3 mLs (2.5 mg total) by nebulization every 4 (four) hours as  needed for wheezing or shortness of breath. 11/22/14  Yes Sherwood Gambler, MD  atorvastatin (LIPITOR) 20 MG tablet Take 1 tablet (20 mg total) by mouth every evening. 08/09/14  Yes Noland Fordyce, PA-C  diazepam (VALIUM) 5 MG tablet Take 1 tablet (5 mg total) by mouth 3 (three) times daily. 09/27/15  Yes Dustin Flock, MD  docusate sodium 100 MG CAPS Take 100 mg by mouth 2 (two) times daily. 02/06/14  Yes Nat Christen, PA-C  escitalopram (LEXAPRO) 10 MG tablet Take 1 tablet (10 mg total) by mouth daily. 11/26/13  Yes Annita Brod, MD  feeding supplement, ENSURE ENLIVE, (ENSURE ENLIVE) LIQD Take 237 mLs by mouth 3 (three) times daily with meals. 09/27/15  Yes Dustin Flock, MD  guaiFENesin (MUCINEX) 600 MG 12 hr tablet Take 1 tablet (600 mg total) by mouth 2 (two) times daily. 09/27/15  Yes Dustin Flock, MD  losartan (COZAAR) 50 MG tablet Take 1 tablet (50 mg total) by mouth daily. 08/09/14  Yes Noland Fordyce, PA-C  metoprolol tartrate (LOPRESSOR) 25 MG tablet Take 25 mg by mouth 2 (two) times daily.   Yes Historical Provider, MD  omeprazole (PRILOSEC) 20 MG capsule Take 1 capsule (20 mg total) by mouth daily. 08/09/14  Yes Noland Fordyce, PA-C  QUEtiapine (SEROQUEL) 50 MG tablet Take 1 tablet (50 mg total) by mouth every morning. 06/28/14  Yes Antonietta Breach, PA-C  risperiDONE (RISPERDAL) 0.5 MG tablet Take 1 tablet (0.5 mg total) by mouth 2 (two) times daily. 11/26/13  Yes Annita Brod, MD  thiamine (VITAMIN B-1) 100 MG tablet Take 100 mg by mouth daily.   Yes Historical Provider, MD  tiotropium (SPIRIVA) 18 MCG inhalation capsule Place 1 capsule (18 mcg total) into inhaler and inhale daily. 06/28/14  Yes Antonietta Breach, PA-C  albuterol (PROVENTIL HFA;VENTOLIN HFA) 108 (90 BASE) MCG/ACT inhaler Inhale 2 puffs into the lungs every 6 (six) hours as needed for wheezing or shortness of breath (wheezing).    Historical Provider, MD  nitroGLYCERIN (NITROSTAT) 0.4 MG SL tablet Place 1 tablet (0.4 mg total) under  the tongue every 5 (five) minutes as needed for chest pain (up to 3 doses). 08/09/14   Noland Fordyce, PA-C  oxyCODONE-acetaminophen (PERCOCET) 5-325 MG tablet Take 1 tablet by mouth every 8 (eight) hours as needed for severe pain. 09/27/15   Shreyang Patel, MD   BP 106/90 mmHg  Pulse 83  Temp(Src) 95.7 F (35.4 C) (Oral)  Resp 16  SpO2 95% Physical Exam  Constitutional: He appears well-developed and well-nourished.  HENT:  Head: Normocephalic and atraumatic.  Eyes: Pupils are equal, round, and reactive to light.  Neck: Normal range of motion.  Cardiovascular: Normal rate, regular rhythm, normal heart sounds  and intact distal pulses.  Exam reveals no gallop and no friction rub.   No murmur heard. Pulmonary/Chest: Effort normal. No respiratory distress. He has no wheezes. He has rales (right). He exhibits no tenderness.  Mech vent sounds, intubated by EMS. Decreased air movement on left  Abdominal: Soft. Bowel sounds are normal. He exhibits no distension and no mass. There is no tenderness. There is no rebound and no guarding.  Ostomy in left abd  Musculoskeletal: Normal range of motion.  Lymphadenopathy:    He has no cervical adenopathy.  Skin: Skin is warm and dry. No rash noted. He is not diaphoretic.  Nursing note and vitals reviewed.   ED Course  .Intubation Date/Time: 10/08/2015 11:57 PM Performed by: Sherian Maroon Authorized by: Sherian Maroon Consent: The procedure was performed in an emergent situation. Indications: respiratory distress Intubation method: video-assisted Patient status: paralyzed (RSI) Preoxygenation: BVM Sedatives: etomidate Paralytic: rocuronium Tube size: 7.0 mm Tube type: cuffed Number of attempts: 1 Cords visualized: yes Post-procedure assessment: chest rise,  ETCO2 monitor and CO2 detector Breath sounds: reduced on left Cuff inflated: yes ETT to lip: 23 cm Tube secured with: ETT holder Chest x-ray interpreted by me. Chest x-ray findings:  endotracheal tube in appropriate position Patient tolerance: Patient tolerated the procedure well with no immediate complications   (including critical care time) Labs Review Labs Reviewed  CBC WITH DIFFERENTIAL/PLATELET - Abnormal; Notable for the following:    WBC 12.7 (*)    RBC 3.79 (*)    Hemoglobin 11.2 (*)    HCT 38.8 (*)    MCV 102.4 (*)    MCHC 28.9 (*)    Neutro Abs 9.3 (*)    Monocytes Absolute 1.1 (*)    All other components within normal limits  COMPREHENSIVE METABOLIC PANEL - Abnormal; Notable for the following:    Potassium 5.4 (*)    Chloride 89 (*)    CO2 44 (*)    Glucose, Bld 184 (*)    BUN 31 (*)    Albumin 2.9 (*)    All other components within normal limits  TROPONIN I - Abnormal; Notable for the following:    Troponin I 0.04 (*)    All other components within normal limits  URINE RAPID DRUG SCREEN, HOSP PERFORMED - Abnormal; Notable for the following:    Benzodiazepines POSITIVE (*)    All other components within normal limits  ACETAMINOPHEN LEVEL - Abnormal; Notable for the following:    Acetaminophen (Tylenol), Serum <10 (*)    All other components within normal limits  URINALYSIS, ROUTINE W REFLEX MICROSCOPIC (NOT AT [facility]) - Abnormal; Notable for the following:    APPearance CLOUDY (*)    Hgb urine dipstick MODERATE (*)    Protein, ur 100 (*)    All other components within normal limits  URINE MICROSCOPIC-ADD ON - Abnormal; Notable for the following:    Squamous Epithelial / LPF 0-5 (*)    Bacteria, UA FEW (*)    Casts HYALINE CASTS (*)    All other components within normal limits  CBG MONITORING, ED - Abnormal; Notable for the following:    Glucose-Capillary 138 (*)    All other components within normal limits  I-STAT CG4 LACTIC ACID, ED - Abnormal; Notable for the following:    Lactic Acid, Venous 8.00 (*)    All other components within normal limits  I-STAT ARTERIAL BLOOD GAS, ED - Abnormal; Notable for the following:    pCO2 arterial  79.5 (*)  pO2, Arterial 27.0 (*)    Bicarbonate 46.8 (*)    Acid-Base Excess 18.0 (*)    All other components within normal limits  I-STAT ARTERIAL BLOOD GAS, ED - Abnormal; Notable for the following:    pH, Arterial 7.468 (*)    pCO2 arterial 57.8 (*)    pO2, Arterial 52.0 (*)    Bicarbonate 42.5 (*)    Acid-Base Excess 16.0 (*)    All other components within normal limits  CULTURE, BLOOD (ROUTINE X 2)  CULTURE, BLOOD (ROUTINE X 2)  SALICYLATE LEVEL  BLOOD GAS, ARTERIAL  I-STAT CG4 LACTIC ACID, ED    Imaging Review Dg Chest Port 1 View  10/08/2015  CLINICAL DATA:  Respiratory arrest.  Endotracheal tube placement. EXAM: PORTABLE CHEST 1 VIEW COMPARISON:  CT chest 09/23/2015.  Chest 09/23/2015. FINDINGS: Endotracheal tube has been placed with tip measuring 5 cm above the carina. An enteric tube is present. Tip is off the field of view but below the left hemidiaphragm. Since the previous study, there is extensive volume loss in the left lung with collapse and consolidation of the left upper lung. Compensatory hyperinflation of the right lung. No visible pneumothorax. Normal heart size and pulmonary vascularity. Emphysematous changes and scattered fibrosis in the right lung. Old right rib fractures. IMPRESSION: Appliances appear in satisfactory position. Since the previous study, there is interval volume loss in the left hemi thorax with collapse and consolidation of the left upper lung. Centrally obstructing process or endobronchial lesion should be excluded. Compensatory hyperinflation of the right lung. Emphysematous changes and scarring on the right. Old right rib fractures. Electronically Signed   By: Lucienne Capers M.D.   On: 10/08/2015 23:28   I have personally reviewed and evaluated these images and lab results as part of my medical decision-making.   EKG Interpretation   Date/Time:  Saturday October 08 2015 22:02:05 EST Ventricular Rate:  88 PR Interval:  162 QRS Duration:  97 QT Interval:  362 QTC Calculation: 438 R Axis:   97 Text Interpretation:  Right and left arm electrode reversal,  interpretation assumes no reversal Sinus rhythm Right atrial enlargement  Borderline right axis deviation Nonspecific T abnormalities, lateral leads  Artifact in lead(s) I III aVR aVL aVF V2 V3 Confirmed by ZAVITZ  MD,  JOSHUA ([id number]) on 10/08/2015 10:31:40 PM      MDM   Final diagnoses:  Acute respiratory failure with hypoxia ([place])    59 y.o. male with a history of COPD as well as alcohol abuse and diverticulitis with colon resection and colostomy who was recently discharged from the hospital to SNF after diagnosed with encephalopathy. EMS was called for respiratory distress today and they found him to have agonal breaths. Attempted intubation in the field and brought to ED. No pulses lost in route.  On arrival, patient hypoxic to the 1s. Has good breath sounds on the right with pleural lung side on bedside ultrasound. Lung sounds in the left are diminished and no lung side seen on ultrasound. Decided to do a tube exchange. Successful.  Lactic acid 8, sepsis. Covered w/broad spectrum Abx.  CXR shows concern for obstructive process in left lung. Mass?  Pt on 100% Fio2 on vent w/Pa02 in 50s. Admit to ICU.   Pt was seen under the supervision of Dr. Reather Converse.   Sherian Maroon, MD 10/09/15 0028  Elnora Morrison, MD 10/09/15 [phone]

## 2015-10-08 NOTE — Code Documentation (Signed)
On assessment with glidescope tube by ems not visualized in vocal cords. Replaced by md on arrival here.

## 2015-10-08 NOTE — ED Notes (Signed)
Screenings performed to best of my ability due to intubated, gathered from SNF paperwork.

## 2015-10-08 NOTE — ED Notes (Signed)
Pt here from [facility] for respiratory arrest, unknown background on what occurred leading to arrest due to poor historian a SNF. EMS arrived and intubated pt with 7.0 tube and pt has had capnography in 70's given versed and fentanyl for sedation, IO placed to right leg. ronchi noted with ems on assessment

## 2015-10-09 ENCOUNTER — Inpatient Hospital Stay (HOSPITAL_COMMUNITY): Payer: Medicaid Other

## 2015-10-09 DIAGNOSIS — Z9911 Dependence on respirator [ventilator] status: Secondary | ICD-10-CM | POA: Diagnosis present

## 2015-10-09 DIAGNOSIS — N179 Acute kidney failure, unspecified: Secondary | ICD-10-CM | POA: Diagnosis not present

## 2015-10-09 DIAGNOSIS — F419 Anxiety disorder, unspecified: Secondary | ICD-10-CM | POA: Diagnosis present

## 2015-10-09 DIAGNOSIS — H9190 Unspecified hearing loss, unspecified ear: Secondary | ICD-10-CM | POA: Diagnosis present

## 2015-10-09 DIAGNOSIS — Z4659 Encounter for fitting and adjustment of other gastrointestinal appliance and device: Secondary | ICD-10-CM | POA: Diagnosis not present

## 2015-10-09 DIAGNOSIS — I739 Peripheral vascular disease, unspecified: Secondary | ICD-10-CM | POA: Diagnosis present

## 2015-10-09 DIAGNOSIS — T380X5A Adverse effect of glucocorticoids and synthetic analogues, initial encounter: Secondary | ICD-10-CM | POA: Diagnosis present

## 2015-10-09 DIAGNOSIS — E872 Acidosis: Secondary | ICD-10-CM | POA: Diagnosis present

## 2015-10-09 DIAGNOSIS — A419 Sepsis, unspecified organism: Secondary | ICD-10-CM | POA: Diagnosis present

## 2015-10-09 DIAGNOSIS — Z515 Encounter for palliative care: Secondary | ICD-10-CM | POA: Diagnosis not present

## 2015-10-09 DIAGNOSIS — R339 Retention of urine, unspecified: Secondary | ICD-10-CM | POA: Diagnosis not present

## 2015-10-09 DIAGNOSIS — K219 Gastro-esophageal reflux disease without esophagitis: Secondary | ICD-10-CM | POA: Diagnosis present

## 2015-10-09 DIAGNOSIS — Z7189 Other specified counseling: Secondary | ICD-10-CM | POA: Diagnosis not present

## 2015-10-09 DIAGNOSIS — J9622 Acute and chronic respiratory failure with hypercapnia: Secondary | ICD-10-CM | POA: Diagnosis present

## 2015-10-09 DIAGNOSIS — J189 Pneumonia, unspecified organism: Secondary | ICD-10-CM | POA: Diagnosis not present

## 2015-10-09 DIAGNOSIS — I119 Hypertensive heart disease without heart failure: Secondary | ICD-10-CM | POA: Diagnosis present

## 2015-10-09 DIAGNOSIS — J9600 Acute respiratory failure, unspecified whether with hypoxia or hypercapnia: Secondary | ICD-10-CM | POA: Diagnosis present

## 2015-10-09 DIAGNOSIS — F17210 Nicotine dependence, cigarettes, uncomplicated: Secondary | ICD-10-CM | POA: Diagnosis present

## 2015-10-09 DIAGNOSIS — J9621 Acute and chronic respiratory failure with hypoxia: Secondary | ICD-10-CM | POA: Diagnosis present

## 2015-10-09 DIAGNOSIS — Y95 Nosocomial condition: Secondary | ICD-10-CM | POA: Diagnosis present

## 2015-10-09 DIAGNOSIS — H9193 Unspecified hearing loss, bilateral: Secondary | ICD-10-CM | POA: Diagnosis not present

## 2015-10-09 DIAGNOSIS — Z66 Do not resuscitate: Secondary | ICD-10-CM | POA: Diagnosis present

## 2015-10-09 DIAGNOSIS — E876 Hypokalemia: Secondary | ICD-10-CM | POA: Diagnosis not present

## 2015-10-09 DIAGNOSIS — Z8546 Personal history of malignant neoplasm of prostate: Secondary | ICD-10-CM | POA: Diagnosis not present

## 2015-10-09 DIAGNOSIS — K5792 Diverticulitis of intestine, part unspecified, without perforation or abscess without bleeding: Secondary | ICD-10-CM | POA: Diagnosis present

## 2015-10-09 DIAGNOSIS — T4275XA Adverse effect of unspecified antiepileptic and sedative-hypnotic drugs, initial encounter: Secondary | ICD-10-CM | POA: Diagnosis not present

## 2015-10-09 DIAGNOSIS — F10231 Alcohol dependence with withdrawal delirium: Secondary | ICD-10-CM | POA: Diagnosis present

## 2015-10-09 DIAGNOSIS — J441 Chronic obstructive pulmonary disease with (acute) exacerbation: Secondary | ICD-10-CM | POA: Diagnosis not present

## 2015-10-09 DIAGNOSIS — T17890A Other foreign object in other parts of respiratory tract causing asphyxiation, initial encounter: Secondary | ICD-10-CM | POA: Diagnosis not present

## 2015-10-09 DIAGNOSIS — R64 Cachexia: Secondary | ICD-10-CM | POA: Diagnosis present

## 2015-10-09 DIAGNOSIS — R0602 Shortness of breath: Secondary | ICD-10-CM | POA: Diagnosis present

## 2015-10-09 DIAGNOSIS — Z933 Colostomy status: Secondary | ICD-10-CM | POA: Diagnosis not present

## 2015-10-09 DIAGNOSIS — R1310 Dysphagia, unspecified: Secondary | ICD-10-CM | POA: Diagnosis present

## 2015-10-09 DIAGNOSIS — R7989 Other specified abnormal findings of blood chemistry: Secondary | ICD-10-CM | POA: Diagnosis present

## 2015-10-09 DIAGNOSIS — E43 Unspecified severe protein-calorie malnutrition: Secondary | ICD-10-CM | POA: Diagnosis present

## 2015-10-09 DIAGNOSIS — R001 Bradycardia, unspecified: Secondary | ICD-10-CM | POA: Diagnosis not present

## 2015-10-09 DIAGNOSIS — I10 Essential (primary) hypertension: Secondary | ICD-10-CM | POA: Diagnosis not present

## 2015-10-09 DIAGNOSIS — Z9981 Dependence on supplemental oxygen: Secondary | ICD-10-CM | POA: Diagnosis not present

## 2015-10-09 DIAGNOSIS — F39 Unspecified mood [affective] disorder: Secondary | ICD-10-CM | POA: Diagnosis present

## 2015-10-09 DIAGNOSIS — J9601 Acute respiratory failure with hypoxia: Secondary | ICD-10-CM

## 2015-10-09 DIAGNOSIS — R6521 Severe sepsis with septic shock: Secondary | ICD-10-CM | POA: Diagnosis present

## 2015-10-09 DIAGNOSIS — R680 Hypothermia, not associated with low environmental temperature: Secondary | ICD-10-CM | POA: Diagnosis present

## 2015-10-09 DIAGNOSIS — I2510 Atherosclerotic heart disease of native coronary artery without angina pectoris: Secondary | ICD-10-CM | POA: Diagnosis present

## 2015-10-09 DIAGNOSIS — J440 Chronic obstructive pulmonary disease with acute lower respiratory infection: Secondary | ICD-10-CM | POA: Diagnosis not present

## 2015-10-09 DIAGNOSIS — E785 Hyperlipidemia, unspecified: Secondary | ICD-10-CM | POA: Diagnosis present

## 2015-10-09 DIAGNOSIS — Z681 Body mass index (BMI) 19 or less, adult: Secondary | ICD-10-CM | POA: Diagnosis not present

## 2015-10-09 DIAGNOSIS — G9340 Encephalopathy, unspecified: Secondary | ICD-10-CM | POA: Diagnosis present

## 2015-10-09 DIAGNOSIS — Z8674 Personal history of sudden cardiac arrest: Secondary | ICD-10-CM | POA: Diagnosis not present

## 2015-10-09 DIAGNOSIS — Z23 Encounter for immunization: Secondary | ICD-10-CM | POA: Diagnosis not present

## 2015-10-09 DIAGNOSIS — R402432 Glasgow coma scale score 3-8, at arrival to emergency department: Secondary | ICD-10-CM | POA: Diagnosis present

## 2015-10-09 DIAGNOSIS — Z9289 Personal history of other medical treatment: Secondary | ICD-10-CM | POA: Diagnosis not present

## 2015-10-09 LAB — I-STAT ARTERIAL BLOOD GAS, ED
ACID-BASE EXCESS: 16 mmol/L — AB (ref 0.0–2.0)
Bicarbonate: 42.5 mEq/L — ABNORMAL HIGH (ref 20.0–24.0)
O2 SAT: 90 %
PH ART: 7.468 — AB (ref 7.350–7.450)
PO2 ART: 52 mmHg — AB (ref 80.0–100.0)
Patient temperature: 35.5
TCO2: 44 mmol/L (ref 0–100)
pCO2 arterial: 57.8 mmHg (ref 35.0–45.0)

## 2015-10-09 LAB — POCT I-STAT 3, ART BLOOD GAS (G3+)
Acid-Base Excess: 20 mmol/L — ABNORMAL HIGH (ref 0.0–2.0)
Bicarbonate: 47.1 mEq/L — ABNORMAL HIGH (ref 20.0–24.0)
O2 SAT: 99 %
TCO2: 49 mmol/L (ref 0–100)
pCO2 arterial: 68.6 mmHg (ref 35.0–45.0)
pH, Arterial: 7.448 (ref 7.350–7.450)
pO2, Arterial: 142 mmHg — ABNORMAL HIGH (ref 80.0–100.0)

## 2015-10-09 LAB — CBC
HEMATOCRIT: 32.5 % — AB (ref 39.0–52.0)
HEMOGLOBIN: 9.8 g/dL — AB (ref 13.0–17.0)
MCH: 29.4 pg (ref 26.0–34.0)
MCHC: 30.2 g/dL (ref 30.0–36.0)
MCV: 97.6 fL (ref 78.0–100.0)
Platelets: 209 10*3/uL (ref 150–400)
RBC: 3.33 MIL/uL — AB (ref 4.22–5.81)
RDW: 14 % (ref 11.5–15.5)
WBC: 12 10*3/uL — ABNORMAL HIGH (ref 4.0–10.5)

## 2015-10-09 LAB — BASIC METABOLIC PANEL
ANION GAP: 6 (ref 5–15)
BUN: 21 mg/dL — AB (ref 6–20)
CHLORIDE: 95 mmol/L — AB (ref 101–111)
CO2: 40 mmol/L — ABNORMAL HIGH (ref 22–32)
Calcium: 8.5 mg/dL — ABNORMAL LOW (ref 8.9–10.3)
Creatinine, Ser: 0.7 mg/dL (ref 0.61–1.24)
GFR calc Af Amer: >60 mL/min (ref >60)
GFR calc non Af Amer: >60 mL/min (ref >60)
GLUCOSE: 87 mg/dL (ref 65–99)
POTASSIUM: 4.3 mmol/L (ref 3.5–5.1)
Sodium: 141 mmol/L (ref 135–145)

## 2015-10-09 LAB — GLUCOSE, CAPILLARY
GLUCOSE-CAPILLARY: 100 mg/dL — AB (ref 65–99)
Glucose-Capillary: 83 mg/dL (ref 65–99)

## 2015-10-09 LAB — LACTIC ACID, PLASMA: Lactic Acid, Venous: 1.5 mmol/L (ref 0.5–2.0)

## 2015-10-09 LAB — PHOSPHORUS: Phosphorus: <1 mg/dL — CL (ref 2.5–4.6)

## 2015-10-09 LAB — MRSA PCR SCREENING: MRSA BY PCR: NEGATIVE

## 2015-10-09 LAB — MAGNESIUM: MAGNESIUM: 1.7 mg/dL (ref 1.7–2.4)

## 2015-10-09 MED ORDER — ENOXAPARIN SODIUM 40 MG/0.4ML ~~LOC~~ SOLN
40.0000 mg | SUBCUTANEOUS | Status: DC
Start: 2015-10-09 — End: 2015-10-11
  Administered 2015-10-09 – 2015-10-10 (×2): 40 mg via SUBCUTANEOUS
  Filled 2015-10-09 (×2): qty 0.4

## 2015-10-09 MED ORDER — SODIUM CHLORIDE 0.9 % IV SOLN
25.0000 ug/h | INTRAVENOUS | Status: DC
  Administered 2015-10-09: 50 ug/h via INTRAVENOUS
  Administered 2015-10-10 – 2015-10-11 (×3): 100 ug/h via INTRAVENOUS
  Administered 2015-10-12: 200 ug/h via INTRAVENOUS
  Administered 2015-10-12 – 2015-10-13 (×2): 300 ug/h via INTRAVENOUS
  Filled 2015-10-09 (×8): qty 50

## 2015-10-09 MED ORDER — IPRATROPIUM-ALBUTEROL 0.5-2.5 (3) MG/3ML IN SOLN
3.0000 mL | Freq: Four times a day (QID) | RESPIRATORY_TRACT | Status: DC
Start: 2015-10-09 — End: 2015-10-20
  Administered 2015-10-09 – 2015-10-20 (×45): 3 mL via RESPIRATORY_TRACT
  Filled 2015-10-09 (×44): qty 3

## 2015-10-09 MED ORDER — PANTOPRAZOLE SODIUM 40 MG PO TBEC: 40.0000 mg | DELAYED_RELEASE_TABLET | Freq: Every day | ORAL | Status: DC

## 2015-10-09 MED ORDER — PIPERACILLIN-TAZOBACTAM IN DEX 2-0.25 GM/50ML IV SOLN
2.2500 g | Freq: Once | INTRAVENOUS | Status: AC
  Administered 2015-10-09: 2.25 g via INTRAVENOUS
  Filled 2015-10-09: qty 50

## 2015-10-09 MED ORDER — DOCUSATE SODIUM 100 MG PO CAPS
100.0000 mg | ORAL_CAPSULE | Freq: Two times a day (BID) | ORAL | Status: DC
  Filled 2015-10-09: qty 1

## 2015-10-09 MED ORDER — PANTOPRAZOLE SODIUM 40 MG PO PACK
40.0000 mg | PACK | Freq: Every day | ORAL | Status: DC
  Administered 2015-10-09 – 2015-10-16 (×7): 40 mg
  Filled 2015-10-09 (×8): qty 20

## 2015-10-09 MED ORDER — SODIUM PHOSPHATE 3 MMOLE/ML IV SOLN
30.0000 mmol | Freq: Once | INTRAVENOUS | Status: AC
  Administered 2015-10-09: 30 mmol via INTRAVENOUS
  Filled 2015-10-09: qty 10

## 2015-10-09 MED ORDER — VANCOMYCIN HCL IN DEXTROSE 1-5 GM/200ML-% IV SOLN
1000.0000 mg | Freq: Once | INTRAVENOUS | Status: AC
  Administered 2015-10-09: 1000 mg via INTRAVENOUS
  Filled 2015-10-09: qty 200

## 2015-10-09 MED ORDER — PIPERACILLIN-TAZOBACTAM 3.375 G IVPB
3.3750 g | Freq: Three times a day (TID) | INTRAVENOUS | Status: DC
  Administered 2015-10-09 – 2015-10-16 (×22): 3.375 g via INTRAVENOUS
  Filled 2015-10-09 (×28): qty 50

## 2015-10-09 MED ORDER — SODIUM CHLORIDE 0.9 % IV SOLN
250.0000 mL | INTRAVENOUS | Status: DC | PRN
  Administered 2015-10-12: 250 mL via INTRAVENOUS

## 2015-10-09 MED ORDER — ALBUTEROL SULFATE (2.5 MG/3ML) 0.083% IN NEBU
2.5000 mg | INHALATION_SOLUTION | RESPIRATORY_TRACT | Status: DC | PRN
  Administered 2015-10-09: 2.5 mg via RESPIRATORY_TRACT
  Filled 2015-10-09: qty 3

## 2015-10-09 MED ORDER — QUETIAPINE FUMARATE 50 MG PO TABS
50.0000 mg | ORAL_TABLET | Freq: Every morning | ORAL | Status: DC
  Administered 2015-10-09 – 2015-10-15 (×6): 50 mg via ORAL
  Filled 2015-10-09 (×6): qty 1

## 2015-10-09 MED ORDER — VANCOMYCIN HCL IN DEXTROSE 750-5 MG/150ML-% IV SOLN: 750.0000 mg | INTRAVENOUS | Status: DC

## 2015-10-09 MED ORDER — ANTISEPTIC ORAL RINSE SOLUTION (CORINZ)
7.0000 mL | OROMUCOSAL | Status: DC
Start: 2015-10-09 — End: 2015-10-14
  Administered 2015-10-09 – 2015-10-13 (×44): 7 mL via OROMUCOSAL

## 2015-10-09 MED ORDER — IPRATROPIUM-ALBUTEROL 0.5-2.5 (3) MG/3ML IN SOLN: 3.0000 mL | Freq: Four times a day (QID) | RESPIRATORY_TRACT | Status: DC

## 2015-10-09 MED ORDER — RISPERIDONE 0.5 MG PO TABS
0.5000 mg | ORAL_TABLET | Freq: Two times a day (BID) | ORAL | Status: DC
  Administered 2015-10-09 – 2015-10-15 (×13): 0.5 mg via ORAL
  Filled 2015-10-09 (×17): qty 1

## 2015-10-09 MED ORDER — SENNOSIDES 8.8 MG/5ML PO SYRP
5.0000 mL | ORAL_SOLUTION | Freq: Two times a day (BID) | ORAL | Status: DC
  Administered 2015-10-09 – 2015-10-16 (×13): 5 mL
  Filled 2015-10-09 (×16): qty 5

## 2015-10-09 MED ORDER — VANCOMYCIN HCL IN DEXTROSE 1-5 GM/200ML-% IV SOLN
1000.0000 mg | INTRAVENOUS | Status: DC
  Administered 2015-10-10: 1000 mg via INTRAVENOUS
  Filled 2015-10-09: qty 200

## 2015-10-09 MED ORDER — CHLORHEXIDINE GLUCONATE 0.12% ORAL RINSE (MEDLINE KIT)
15.0000 mL | Freq: Two times a day (BID) | OROMUCOSAL | Status: DC
  Administered 2015-10-09 – 2015-10-13 (×10): 15 mL via OROMUCOSAL

## 2015-10-09 MED ORDER — ESCITALOPRAM OXALATE 10 MG PO TABS
10.0000 mg | ORAL_TABLET | Freq: Every day | ORAL | Status: DC
  Administered 2015-10-09 – 2015-10-15 (×6): 10 mg via ORAL
  Filled 2015-10-09 (×6): qty 1

## 2015-10-09 MED ORDER — ATORVASTATIN CALCIUM 20 MG PO TABS
20.0000 mg | ORAL_TABLET | Freq: Every evening | ORAL | Status: DC
  Administered 2015-10-09 – 2015-10-15 (×5): 20 mg via ORAL
  Filled 2015-10-09 (×7): qty 1

## 2015-10-09 NOTE — Progress Notes (Signed)
ARDS protocol

## 2015-10-09 NOTE — H&P (Addendum)
PULMONARY / CRITICAL CARE MEDICINE   Name: Zachary Walls MRN: [id number] DOB: 04/21/1956    ADMISSION DATE:  10/08/2015 CONSULTATION DATE:  10/09/15  REFERRING MD:  Dr. Elnora Morrison, MD  CHIEF COMPLAINT:  Respiratory failure  HISTORY OF PRESENT ILLNESS:   Zachary Walls is a 59 y.o. male with COPD, hypertension, hyperlipidemia, GERD, tobacco abuse, alcohol abuse, coronary artery disease, peripheral vascular disease, history of cardiac arrest, bowel resection with colostomy who presented with respiratory failure.  Per reports from the ED staff, he was found unresponsive in his SNF with a GCS of 3 but maintaining a pulse. He was intubated in the field and brought to the [facility] ED. He was found to have hypoxemia (SpO2 50%s) on the ventilator and collapse of the left upper lobe on CXR. The ED performed a replacement of his ETT with a 7.29F tube. He remained very hypoxic with PaO2 in the 50s despite FiO2 100%, PEEP 5. His lactate was 8, BP low and hypothermic on arrival which improved after fluid resuscitation, antibiotics and active external warming. MICU was called then for admission. His mental status also recovered to him making eye contact and flailing all extremities for which he was given boluses of fentanyl and versed.  No further history was obtainable as the patient is intubated and sedated. His family was attempted to be reached on multiple phone numbers which went to disconnected lines or un-identified voice mailboxes.  PAST MEDICAL HISTORY :  He  has a past medical history of COPD (chronic obstructive pulmonary disease) ([place]); History of pneumonia; Hypertension; HOH (hard of hearing); Protein calorie malnutrition ([place]); ETOH abuse; Tobacco abuse; Diverticulitis; Cardiac arrest ([place]); Difficult intubation; CAD (coronary artery disease); Hyponatremia; Polysubstance abuse; Anxiety; Shortness of breath; Peripheral vascular disease ([place]); and Prostate cancer ([place]).  PAST SURGICAL  HISTORY: He  has past surgical history that includes Hernia repair; Colostomy (12/11/2011); Colostomy revision (12/11/2011); Bowel resection (12/11/2011); Cystoscopy w/ ureteral stent placement (12/11/2011); Cardiac catheterization (08/25/2013); Tracheostomy (01/2013); Colon surgery; Tracheostomy closure; and left heart catheterization with coronary angiogram (N/A, 08/25/2013).  Allergies  Allergen Reactions  . Bee Venom Shortness Of Breath and Swelling  . Shellfish Allergy Anaphylaxis  . Codeine Itching and Rash    No current facility-administered medications on file prior to encounter.   Current Outpatient Prescriptions on File Prior to Encounter  Medication Sig  . albuterol (PROVENTIL) (2.5 MG/3ML) 0.083% nebulizer solution Take 3 mLs (2.5 mg total) by nebulization every 4 (four) hours as needed for wheezing or shortness of breath.  [name] atorvastatin (LIPITOR) 20 MG tablet Take 1 tablet (20 mg total) by mouth every evening.  . diazepam (VALIUM) 5 MG tablet Take 1 tablet (5 mg total) by mouth 3 (three) times daily.  [name] docusate sodium 100 MG CAPS Take 100 mg by mouth 2 (two) times daily.  [name] escitalopram (LEXAPRO) 10 MG tablet Take 1 tablet (10 mg total) by mouth daily.  . feeding supplement, ENSURE ENLIVE, (ENSURE ENLIVE) LIQD Take 237 mLs by mouth 3 (three) times daily with meals.  [name] guaiFENesin (MUCINEX) 600 MG 12 hr tablet Take 1 tablet (600 mg total) by mouth 2 (two) times daily.  [name] losartan (COZAAR) 50 MG tablet Take 1 tablet (50 mg total) by mouth daily.  . metoprolol tartrate (LOPRESSOR) 25 MG tablet Take 25 mg by mouth 2 (two) times daily.  [name] omeprazole (PRILOSEC) 20 MG capsule Take 1 capsule (20 mg total) by mouth daily.  . QUEtiapine (SEROQUEL) 50 MG tablet  Take 1 tablet (50 mg total) by mouth every morning.  . risperiDONE (RISPERDAL) 0.5 MG tablet Take 1 tablet (0.5 mg total) by mouth 2 (two) times daily.  [name] thiamine (VITAMIN B-1) 100 MG tablet Take 100 mg by mouth daily.  [name] tiotropium  (SPIRIVA) 18 MCG inhalation capsule Place 1 capsule (18 mcg total) into inhaler and inhale daily.  [name] albuterol (PROVENTIL HFA;VENTOLIN HFA) 108 (90 BASE) MCG/ACT inhaler Inhale 2 puffs into the lungs every 6 (six) hours as needed for wheezing or shortness of breath (wheezing).  . nitroGLYCERIN (NITROSTAT) 0.4 MG SL tablet Place 1 tablet (0.4 mg total) under the tongue every 5 (five) minutes as needed for chest pain (up to 3 doses).  [name] oxyCODONE-acetaminophen (PERCOCET) 5-325 MG tablet Take 1 tablet by mouth every 8 (eight) hours as needed for severe pain.    FAMILY HISTORY:  His indicated that his mother is deceased. He indicated that his father is deceased. He indicated that his sister is alive. He indicated that only one of his two brothers is alive.   SOCIAL HISTORY: He  reports that he has been smoking Cigarettes.  He has a 22 pack-year smoking history. He has never used smokeless tobacco. He reports that he drinks alcohol. He reports that he uses illicit drugs (Marijuana).  REVIEW OF SYSTEMS:   ROS unable to be obtained aside from HPI from sedated patient   VITAL SIGNS: BP 119/71 mmHg  Pulse 96  Temp(Src) 99.9 F (37.7 C) (Oral)  Resp 12  Ht 5\' 7"  (1.702 m)  SpO2 100%  VENTILATOR SETTINGS: Vent Mode:  [-] PRVC FiO2 (%):  [100 %] 100 % Set Rate:  [15 bmp-18 bmp] 18 bmp Vt Set:  [400 mL-500 mL] 400 mL PEEP:  [5 cmH20-14 cmH20] 14 cmH20 Plateau Pressure:  [22 cmH20] 22 cmH20  PHYSICAL EXAMINATION: General:  Intubated, sedated, cachectic appearing Neuro:  When aroused (in between sedation), patient makes eye contact, mouths words, moves all extremities with purpose with 5/5 strength, there is a baseline tremor HEENT:  Dry mucous membranes, clear oropharynx, ETT in place Cardiovascular:  Tachycardic, regular rhythm, no murmurs appreciated Lungs:  Diminished breath sounds in bilateral apicies, fair in bases, no rhonchi or wheezes Abdomen:  Soft, NT, ND, ostomy in  place Musculoskeletal:  No deformities noted Skin:  No rashes seen  LABS:  BMET  Recent Labs Lab 10/08/15 2208  NA 143  K 5.4*  CL 89*  CO2 44*  BUN 31*  CREATININE 1.13  GLUCOSE 184*    Electrolytes  Recent Labs Lab 10/08/15 2208  CALCIUM 9.4    CBC  Recent Labs Lab 10/08/15 2208  WBC 12.7*  HGB 11.2*  HCT 38.8*  PLT 233    Coag's No results for input(s): APTT, INR in the last 168 hours.  Sepsis Markers  Recent Labs Lab 10/08/15 2215  LATICACIDVEN 8.00*    ABG  Recent Labs Lab 10/08/15 2336 10/09/15 0018  PHART 7.378 7.468*  PCO2ART 79.5* 57.8*  PO2ART 27.0* 52.0*    Liver Enzymes  Recent Labs Lab 10/08/15 2208  AST 30  ALT 19  ALKPHOS 67  BILITOT 1.0  ALBUMIN 2.9*    Cardiac Enzymes  Recent Labs Lab 10/08/15 2208  TROPONINI 0.04*    Glucose  Recent Labs Lab 10/08/15 2204  GLUCAP 138*    Imaging Dg Chest Port 1 View  10/08/2015  CLINICAL DATA:  Respiratory arrest.  Endotracheal tube placement. EXAM: PORTABLE CHEST 1 VIEW COMPARISON:  CT  chest 09/23/2015.  Chest 09/23/2015. FINDINGS: Endotracheal tube has been placed with tip measuring 5 cm above the carina. An enteric tube is present. Tip is off the field of view but below the left hemidiaphragm. Since the previous study, there is extensive volume loss in the left lung with collapse and consolidation of the left upper lung. Compensatory hyperinflation of the right lung. No visible pneumothorax. Normal heart size and pulmonary vascularity. Emphysematous changes and scattered fibrosis in the right lung. Old right rib fractures. IMPRESSION: Appliances appear in satisfactory position. Since the previous study, there is interval volume loss in the left hemi thorax with collapse and consolidation of the left upper lung. Centrally obstructing process or endobronchial lesion should be excluded. Compensatory hyperinflation of the right lung. Emphysematous changes and scarring on the  right. Old right rib fractures. Electronically Signed   By: Lucienne Capers M.D.   On: 10/08/2015 23:28   DISCUSSION: Zachary Walls is a 59 y.o. male with multiple chronic medical illnesses including substance abuse, bowel resection s/p colostomy and COPD on oxygen who presented after being found in agonal respirations, hypotensive, and hypoxemic. He has LUL partial collapse on CXR which is suspicious for segmental bronchus airway obstruction - could be mucous, foreign body or mass but I favor the former. He fits the picture for septic shock from a COPD exacerbation / pneumonia with his leukocytosis, lactic acidosis, hypotension, hypothermia. His vital signs have improved from arrival after fluids, antibiotics and up-titration of his PEEP on the mechanical ventilator. His obtundation has significantly improved after his respiratory status did.  ASSESSMENT / PLAN:  PULMONARY A: Acute hypoxic respiratory failure  Chronic COPD with hypercapnia Partial collapse of the left lung from suspected mucous plug; mass and foreign body on differential P:   - Will continue mechanical ventilation. He has improved his oxygenation with increased PEEP likely reflecting improved lung recruitment - Consider bronchoscopy for airway inspection. Of note, he has a 7.0 ETT which will have to be upsized to fit a bronchoscope - CXR in AM   CARDIOVASCULAR A:  Septic shock from pneumonia - Hypotension resolved after fluids.  P:  - Holding home anti-hypertensive medications - Repeat lactate pending  RENAL A:   Acute kidney injury - Likely prerenal azotemia from poor PO intake + sepsis. Urine output improved after fluids. P:   - Monitor hourly urine output  - Continued fluid resuscitation if needed  GASTROINTESTINAL A:   S/p colostomy, poor protein intake with chronic malnutrition P:   - Clinical nutrition consult  HEMATOLOGIC A:   No issues P:  - DVT prophylaxis with enoxaparin  INFECTIOUS A:    Healthcare associated pneumonia - Will treat as pneumonia as cause of his septic shock on arrival due to CXR findings. U/A clear. Blood cultures pending. Overall clinically improving after early resusitation. P:   - Vanc and Zosyn - Follow up blood, urine and sputum cultures  NEUROLOGIC A:   Acute encephalopathy - Resolving with improved respiratory status. Initial GCS was 3 but now patient more interactive and having non-focal neuro exam. Will aim for RASS -1 due to hypoxemia P:   - RASS goal: -1 - Will use fentanyl drip with intermittent PRN boluses of fentanyl and versed  FAMILY  - Updates: As above, family attempted to be contacted with several listed phone numbers in the chart but many were disconnected numbers or un-identified voice mails  - Inter-disciplinary family meet or Palliative Care meeting due by:  10/16/15  60  mins of critical care time was spent on this admission  Pulmonary and [place] Pager: [phone]  10/09/2015, 2:57 AM

## 2015-10-09 NOTE — Progress Notes (Signed)
Vent changes made by MD. ARDS protocol.

## 2015-10-09 NOTE — Progress Notes (Signed)
ANTIBIOTIC CONSULT NOTE - INITIAL  Pharmacy Consult for Vancomycin and Zosyn Indication: HCAP  Allergies  Allergen Reactions  . Bee Venom Shortness Of Breath and Swelling  . Shellfish Allergy Anaphylaxis  . Codeine Itching and Rash    Patient Measurements: Height: 5\' 7"  (170.2 cm) IBW/kg (Calculated) : 66.1  Vital Signs: Temp: 99.9 F (37.7 C) (12/18 0215) Temp Source: Oral (12/18 0002) BP: 119/71 mmHg (12/18 0230) Pulse Rate: 96 (12/18 0230) Intake/Output from previous day: 12/17 0701 - 12/18 0700 In: 2000 [I.V.:2000] Out: -  Intake/Output from this shift: Total I/O In: 2000 [I.V.:2000] Out: -   Labs:  Recent Labs  10/08/15 2208  WBC 12.7*  HGB 11.2*  PLT 233  CREATININE 1.13   Estimated Creatinine Clearance: 48.2 mL/min (by C-G formula based on Cr of 1.13). No results for input(s): VANCOTROUGH, VANCOPEAK, VANCORANDOM, GENTTROUGH, GENTPEAK, GENTRANDOM, TOBRATROUGH, TOBRAPEAK, TOBRARND, AMIKACINPEAK, AMIKACINTROU, AMIKACIN in the last 72 hours.   Microbiology: No results found for this or any previous visit (from the past 720 hour(s)).  Medical History: Past Medical History  Diagnosis Date  . COPD (chronic obstructive pulmonary disease) ([place])   . History of pneumonia   . Hypertension     a. Normotensive 08/2013 off meds.  [name] HOH (hard of hearing)   . Protein calorie malnutrition ([place])   . ETOH abuse   . Tobacco abuse   . Diverticulitis     a. s/p colon resection and colostomy  . Cardiac arrest ([place])     a. 01/2013, felt to be 2/2 severe COPD req trach;  b. 01/2013 Echo: EF 65-70% w/o rwma.  . Difficult intubation   . CAD (coronary artery disease)     a. Nonobstructive CAD by cath 08/2013 (mod RCA, mid LAD myocardial bridge) - ECG was concerning for inf STEMI but pt ruled out. CP possibly GERD.   [name] Hyponatremia     a. During 08/2013 felt due to polydipsia.  . Polysubstance abuse     a. Tobacco, marijuana, alcoholism.  [name] Anxiety   . Shortness of breath    . Peripheral vascular disease ([place])   . Prostate cancer ([place])     Medications:  See electronic med rec  Assessment: 59 y.o. M presents from SNF with respiratory distress - pt required intubation in the field. Pt with recent hospitalization 12/3-12/6 for encephalopathy. Pt received Vancomycin and Zosyn 2.25gm in ED ~0200. To continue broad spectrum abx for sepsis. LA 8. WBC elevated to 12.7. Estimated Crcl 48 ml/min.   Goal of Therapy:  Vancomycin trough level 15-20 mcg/ml  Plan:  Zosyn 3.375gm IV q8h - subsequent doses over 4 hours Vancomycin 1gm IV q24h Will f/u micro data, renal function, and pt's clinical condition Vanc trough prn  Sherlon Handing, PharmD, BCPS Clinical pharmacist, pager [phone] 10/09/2015 2:45 AM

## 2015-10-09 NOTE — ED Notes (Signed)
Mitten cuffs placed on pt.

## 2015-10-09 NOTE — Progress Notes (Signed)
PULMONARY / CRITICAL CARE MEDICINE   Name: PHOENIXX HOCHBERG MRN: [id number] DOB: 1956/09/10    ADMISSION DATE:  10/08/2015 CONSULTATION DATE:  10/09/15  REFERRING MD:  Dr. Elnora Morrison, MD  CHIEF COMPLAINT:  Respiratory failure  HISTORY OF PRESENT ILLNESS:   KAYIN MIZER is a 59 y.o. male with COPD, hypertension, hyperlipidemia, GERD, tobacco abuse, alcohol abuse, coronary artery disease, peripheral vascular disease, history of cardiac arrest, bowel resection with colostomy who presented with respiratory failure.  Per reports from the ED staff, he was found unresponsive in his SNF with a GCS of 3 but maintaining a pulse. He was intubated in the field and brought to the [facility] ED. He was found to have hypoxemia (SpO2 50%s) on the ventilator and collapse of the left upper lobe on CXR. The ED performed a replacement of his ETT with a 7.22F tube. He remained very hypoxic with PaO2 in the 50s despite FiO2 100%, PEEP 5. His lactate was 8, BP low and hypothermic on arrival which improved after fluid resuscitation, antibiotics and active external warming. MICU was called then for admission. His mental status also recovered to him making eye contact and flailing all extremities for which he was given boluses of fentanyl and versed.  No further history was obtainable as the patient is intubated and sedated. His family was attempted to be reached on multiple phone numbers which went to disconnected lines or un-identified voice mailboxes.  VITAL SIGNS: BP 103/65 mmHg  Pulse 89  Temp(Src) 100.8 F (38.2 C) (Core (Comment))  Resp 18  Ht 5\' 7"  (1.702 m)  Wt 47.1 kg (103 lb 13.4 oz)  BMI 16.26 kg/m2  SpO2 95%  VENTILATOR SETTINGS: Vent Mode:  [-] PRVC FiO2 (%):  [70 %-100 %] 70 % Set Rate:  [15 bmp-18 bmp] 18 bmp Vt Set:  [400 mL-500 mL] 400 mL PEEP:  [5 cmH20-14 cmH20] 14 cmH20 Plateau Pressure:  [22 cmH20-28 cmH20] 28 cmH20  PHYSICAL EXAMINATION: General:  Intubated, sedated, cachectic  appearing Neuro:  When aroused (in between sedation), patient makes eye contact, mouths words, moves all extremities with purpose with 5/5 strength, there is a baseline tremor HEENT:  Dry mucous membranes, clear oropharynx, ETT in place Cardiovascular:  Tachycardic, regular rhythm, no murmurs appreciated Lungs:  Diminished breath sounds in bilateral apicies, fair in bases, no rhonchi or wheezes Abdomen:  Soft, NT, ND, ostomy in place Musculoskeletal:  No deformities noted Skin:  No rashes seen  LABS:  BMET  Recent Labs Lab 10/08/15 2208 10/09/15 0700  NA 143 141  K 5.4* 4.3  CL 89* 95*  CO2 44* 40*  BUN 31* 21*  CREATININE 1.13 0.70  GLUCOSE 184* 87    Electrolytes  Recent Labs Lab 10/08/15 2208 10/09/15 0700  CALCIUM 9.4 8.5*  MG  --  1.7    CBC  Recent Labs Lab 10/08/15 2208 10/09/15 0700  WBC 12.7* 12.0*  HGB 11.2* 9.8*  HCT 38.8* 32.5*  PLT 233 209    Coag's No results for input(s): APTT, INR in the last 168 hours.  Sepsis Markers  Recent Labs Lab 10/08/15 2215 10/09/15 0214  LATICACIDVEN 8.00* 1.5    ABG  Recent Labs Lab 10/08/15 2336 10/09/15 0018 10/09/15 0431  PHART 7.378 7.468* 7.448  PCO2ART 79.5* 57.8* 68.6*  PO2ART 27.0* 52.0* 142.0*    Liver Enzymes  Recent Labs Lab 10/08/15 2208  AST 30  ALT 19  ALKPHOS 67  BILITOT 1.0  ALBUMIN 2.9*  Cardiac Enzymes  Recent Labs Lab 10/08/15 2208  TROPONINI 0.04*    Glucose  Recent Labs Lab 10/08/15 2204  GLUCAP 138*    Imaging Dg Chest Port 1 View  10/09/2015  CLINICAL DATA:  Ventilator dependence. EXAM: PORTABLE CHEST 1 VIEW COMPARISON:  10/08/2015 FINDINGS: Endotracheal tube with tip measuring 6.5 cm above the carina. Enteric tube tip is off the field of view but below the left hemidiaphragm. There is diffuse infiltration/consolidation throughout the left lung with volume loss. Aeration is somewhat improved since previous study but is still compromised.  Hyperinflation of the right lung suggesting diffuse emphysematous change. Focal scarring in the right apex. No pneumothorax. IMPRESSION: Persistent volume loss and consolidation in the left lung although aeration is mildly improved. Electronically Signed   By: Lucienne Capers M.D.   On: 10/09/2015 04:00   Dg Chest Port 1 View  10/08/2015  CLINICAL DATA:  Respiratory arrest.  Endotracheal tube placement. EXAM: PORTABLE CHEST 1 VIEW COMPARISON:  CT chest 09/23/2015.  Chest 09/23/2015. FINDINGS: Endotracheal tube has been placed with tip measuring 5 cm above the carina. An enteric tube is present. Tip is off the field of view but below the left hemidiaphragm. Since the previous study, there is extensive volume loss in the left lung with collapse and consolidation of the left upper lung. Compensatory hyperinflation of the right lung. No visible pneumothorax. Normal heart size and pulmonary vascularity. Emphysematous changes and scattered fibrosis in the right lung. Old right rib fractures. IMPRESSION: Appliances appear in satisfactory position. Since the previous study, there is interval volume loss in the left hemi thorax with collapse and consolidation of the left upper lung. Centrally obstructing process or endobronchial lesion should be excluded. Compensatory hyperinflation of the right lung. Emphysematous changes and scarring on the right. Old right rib fractures. Electronically Signed   By: Lucienne Capers M.D.   On: 10/08/2015 23:28   DISCUSSION: BRANDIN GLASSFORD is a 59 y.o. male with multiple chronic medical illnesses including substance abuse, bowel resection s/p colostomy and COPD on oxygen who presented after being found in agonal respirations, hypotensive, and hypoxemic. He has LUL partial collapse on CXR which is suspicious for segmental bronchus airway obstruction - could be mucous, foreign body or mass but I favor the former. He fits the picture for septic shock from a COPD exacerbation / pneumonia  with his leukocytosis, lactic acidosis, hypotension, hypothermia. His vital signs have improved from arrival after fluids, antibiotics and up-titration of his PEEP on the mechanical ventilator. His obtundation has significantly improved after his respiratory status did.  ASSESSMENT / PLAN:  PULMONARY A: Acute hypoxic respiratory failure  Chronic COPD with hypercapnia Partial collapse of the left lung from suspected mucous plug; mass and foreign body on differential HCAP P:   - Continue mechanical ventilation. He has improved his oxygenation with increased PEEP likely reflecting improved lung recruitment. Attempt to wean PEEP 12/18 - Consider bronchoscopy for airway inspection particularly if L volume loss does not improve. Of note, he has a 7.0 ETT which will have to be upsized to fit a bronchoscope - follow CXR - duoneb scheduled   CARDIOVASCULAR A:  Septic shock from pneumonia - Hypotension resolved after fluids.  P:  - Holding home anti-hypertensive medications - lactate cleared  RENAL A:   Acute kidney injury - Likely prerenal azotemia from poor PO intake + sepsis. S Cr and Urine output improved after fluids. P:   - follow BMP and UOP  GASTROINTESTINAL A:  S/p colostomy, poor protein intake with chronic malnutrition P:   - Clinical nutrition consult - start TF's   HEMATOLOGIC A:   No issues P:  - DVT prophylaxis with enoxaparin  INFECTIOUS A:   Healthcare associated pneumonia - Will treat as pneumonia as cause of his septic shock on arrival due to CXR findings. U/A clear. Blood cultures pending. Overall clinically improving after early resusitation. P:   - Vanc and Zosyn - Follow up blood, urine and sputum cultures  NEUROLOGIC A:   Acute encephalopathy - Resolving with improved respiratory status. Initial GCS was 3 but now patient more interactive and having non-focal neuro exam. Will aim for RASS -1 due to hypoxemia P:   - RASS goal: -1 - Will use fentanyl  drip with intermittent PRN boluses of fentanyl and versed  FAMILY  - Updates: Son and daughter-in-law were reached by RN this am, are planning to come to hospital  - Inter-disciplinary family meet or Palliative Care meeting due by:  10/16/15   Independent CC time 27 minutes   [name], MD, PhD 10/09/2015, 7:56 AM [facility] [phone] or if no answer [phone]

## 2015-10-09 NOTE — ED Notes (Signed)
Multiple attemtps at getting abg and lactic acid repeats, md aware

## 2015-10-09 NOTE — Progress Notes (Signed)
RT found pt on 70% FIO2. Sat 100%.

## 2015-10-09 NOTE — ED Notes (Signed)
MICU Dr. Irene Shipper with RT.

## 2015-10-10 ENCOUNTER — Inpatient Hospital Stay (HOSPITAL_COMMUNITY): Payer: Medicaid Other

## 2015-10-10 DIAGNOSIS — J441 Chronic obstructive pulmonary disease with (acute) exacerbation: Secondary | ICD-10-CM

## 2015-10-10 DIAGNOSIS — J189 Pneumonia, unspecified organism: Secondary | ICD-10-CM

## 2015-10-10 DIAGNOSIS — J9601 Acute respiratory failure with hypoxia: Secondary | ICD-10-CM

## 2015-10-10 LAB — BASIC METABOLIC PANEL
ANION GAP: 7 (ref 5–15)
BUN: 16 mg/dL (ref 6–20)
CALCIUM: 7.7 mg/dL — AB (ref 8.9–10.3)
CO2: 38 mmol/L — ABNORMAL HIGH (ref 22–32)
CREATININE: 0.82 mg/dL (ref 0.61–1.24)
Chloride: 93 mmol/L — ABNORMAL LOW (ref 101–111)
GFR calc Af Amer: >60 mL/min (ref >60)
Glucose, Bld: 117 mg/dL — ABNORMAL HIGH (ref 65–99)
POTASSIUM: 3.7 mmol/L (ref 3.5–5.1)
Sodium: 138 mmol/L (ref 135–145)

## 2015-10-10 LAB — BLOOD GAS, ARTERIAL
Acid-Base Excess: 12.3 mmol/L — ABNORMAL HIGH (ref 0.0–2.0)
BICARBONATE: 37.4 meq/L — AB (ref 20.0–24.0)
Drawn by: 41977
FIO2: 0.4
LHR: 18 {breaths}/min
MECHVT: 400 mL
O2 Saturation: 81.4 %
PEEP/CPAP: 8 cmH2O
Patient temperature: 98.6
TCO2: 39.1 mmol/L (ref 0–100)
pCO2 arterial: 58.2 mmHg (ref 35.0–45.0)
pH, Arterial: 7.423 (ref 7.350–7.450)
pO2, Arterial: 46.1 mmHg — ABNORMAL LOW (ref 80.0–100.0)

## 2015-10-10 LAB — CBC
HCT: 26.7 % — ABNORMAL LOW (ref 39.0–52.0)
Hemoglobin: 8.2 g/dL — ABNORMAL LOW (ref 13.0–17.0)
MCH: 29.2 pg (ref 26.0–34.0)
MCHC: 30.7 g/dL (ref 30.0–36.0)
MCV: 95 fL (ref 78.0–100.0)
PLATELETS: 207 10*3/uL (ref 150–400)
RBC: 2.81 MIL/uL — ABNORMAL LOW (ref 4.22–5.81)
RDW: 14.8 % (ref 11.5–15.5)
WBC: 11.1 10*3/uL — AB (ref 4.0–10.5)

## 2015-10-10 LAB — GLUCOSE, CAPILLARY
Glucose-Capillary: 137 mg/dL — ABNORMAL HIGH (ref 65–99)
Glucose-Capillary: 149 mg/dL — ABNORMAL HIGH (ref 65–99)

## 2015-10-10 MED ORDER — DEXTROSE 5 % IV SOLN
0.0000 ug/min | INTRAVENOUS | Status: DC
  Administered 2015-10-10: 5 ug/min via INTRAVENOUS
  Filled 2015-10-10 (×2): qty 4

## 2015-10-10 MED ORDER — ACETAMINOPHEN 160 MG/5ML PO SOLN
650.0000 mg | Freq: Four times a day (QID) | ORAL | Status: DC | PRN
  Administered 2015-10-10 – 2015-10-18 (×3): 650 mg
  Filled 2015-10-10 (×3): qty 20.3

## 2015-10-10 MED ORDER — VANCOMYCIN HCL IN DEXTROSE 750-5 MG/150ML-% IV SOLN
750.0000 mg | Freq: Two times a day (BID) | INTRAVENOUS | Status: DC
  Administered 2015-10-10 – 2015-10-11 (×4): 750 mg via INTRAVENOUS
  Filled 2015-10-10 (×6): qty 150

## 2015-10-10 MED ORDER — SODIUM CHLORIDE 0.9 % IV BOLUS (SEPSIS)
1000.0000 mL | Freq: Once | INTRAVENOUS | Status: AC
  Administered 2015-10-10: 1000 mL via INTRAVENOUS

## 2015-10-10 MED ORDER — METHYLPREDNISOLONE SODIUM SUCC 125 MG IJ SOLR
60.0000 mg | Freq: Four times a day (QID) | INTRAMUSCULAR | Status: DC
  Administered 2015-10-10 – 2015-10-14 (×16): 60 mg via INTRAVENOUS
  Filled 2015-10-10 (×17): qty 2

## 2015-10-10 MED ORDER — THIAMINE HCL 100 MG/ML IJ SOLN
100.0000 mg | Freq: Every day | INTRAMUSCULAR | Status: AC
  Administered 2015-10-10 – 2015-10-11 (×3): 100 mg via INTRAVENOUS
  Filled 2015-10-10 (×3): qty 2

## 2015-10-10 MED ORDER — DEXMEDETOMIDINE HCL IN NACL 200 MCG/50ML IV SOLN
0.0000 ug/kg/h | INTRAVENOUS | Status: DC
  Administered 2015-10-10 (×2): 0.5 ug/kg/h via INTRAVENOUS
  Administered 2015-10-10 – 2015-10-11 (×2): 0.7 ug/kg/h via INTRAVENOUS
  Administered 2015-10-11 (×2): 0.5 ug/kg/h via INTRAVENOUS
  Filled 2015-10-10 (×6): qty 50

## 2015-10-10 MED ORDER — PHENYLEPHRINE HCL 10 MG/ML IJ SOLN
0.0000 ug/min | INTRAVENOUS | Status: DC
  Administered 2015-10-10: 20 ug/min via INTRAVENOUS
  Administered 2015-10-10: 75 ug/min via INTRAVENOUS
  Filled 2015-10-10 (×3): qty 1

## 2015-10-10 MED ORDER — JEVITY 1.2 CAL PO LIQD
1000.0000 mL | ORAL | Status: DC
Start: 2015-10-10 — End: 2015-10-17
  Administered 2015-10-10 – 2015-10-11 (×2): 1000 mL
  Administered 2015-10-12: 15:00:00
  Administered 2015-10-12: 65 mL/h
  Administered 2015-10-13 – 2015-10-16 (×4): 1000 mL
  Filled 2015-10-10 (×15): qty 1000

## 2015-10-10 NOTE — Progress Notes (Signed)
Pt's UO low throughout day shift, foley flushed with 55ml sterile saline, no issue with tubing patency, bladder scan revealed 23ml urine. Physician made aware, no new orders.

## 2015-10-10 NOTE — Progress Notes (Signed)
Initial Nutrition Assessment  DOCUMENTATION CODES:   Underweight  INTERVENTION:  Initiate Jevity 1.2 @ 20 ml/hr via OGT and increase by 10 ml every 4 hours to goal rate of 65 ml/hr.   Tube feeding regimen provides 1872 kcal (97% of needs), 86 grams of protein, and 1264 ml of H2O.    NUTRITION DIAGNOSIS:   Inadequate oral intake related to inability to eat as evidenced by NPO status.   GOAL:   Patient will meet greater than or equal to 90% of their needs   MONITOR:   Vent status, Labs, TF tolerance, Weight trends, Skin, I & O's  REASON FOR ASSESSMENT:   Consult Enteral/tube feeding initiation and management  ASSESSMENT:   59 y.o. male with COPD, hypertension, hyperlipidemia, GERD, tobacco abuse, alcohol abuse, coronary artery disease, peripheral vascular disease, history of cardiac arrest, bowel resection with colostomy who presented with respiratory failure due to L HCAP superimposed on COPD.   RD consulted for TF management. OGT in place to suction.   Patient is currently intubated on ventilator support MV: 8.5 L/min Temp (24hrs), Avg:101.1 F (38.4 C), Min:98.6 F (37 C), Max:103.6 F (39.8 C) Propofol: none  Labs: low chloride, low calcium, low hemoglobin  Diet Order:  Diet NPO time specified Except for: Sips with Meds  Skin:  Wound (see comment) (open wound on mid coccyx)  Last BM:  colostomy, 25 ml out yesterday  Height:   Ht Readings from Last 1 Encounters:  10/09/15 5\' 7"  (1.702 m)    Weight:   Wt Readings from Last 1 Encounters:  10/10/15 112 lb 10.5 oz (51.1 kg)    Ideal Body Weight:  67.3 kg  BMI:  Body mass index is 17.64 kg/(m^2).  Estimated Nutritional Needs:   Kcal:  1933  Protein:  75-90 grams  Fluid:  1.7-1.9 L/day  EDUCATION NEEDS:   No education needs identified at this time  [place], LDN Inpatient Clinical Dietitian Pager: [phone] After Hours Pager: [phone]

## 2015-10-10 NOTE — Progress Notes (Signed)
[facility] Progress Note Patient Name: [name] DOB: 1956-05-10 MRN: [id number]   Date of Service  10/10/2015  HPI/Events of Note  Persistent Hypotension - BP = 62/45. First liter bolus almost all in.   eICU Interventions  Will order: 1. Phenylephrine IV infusion. Titrate to MAP > 65. 2. 0.9 NaCl 1 liter IV over 1 hour now. (2nd liter)     Intervention Category Major Interventions: Hypotension - evaluation and management  Sommer,Steven Eugene 10/10/2015, 2:36 AM

## 2015-10-10 NOTE — Progress Notes (Signed)
[facility] Progress Note Patient Name: ARLESTER ARREDONDO DOB: [birth year] MRN: [id number]   Date of Service  10/10/2015  HPI/Events of Note  Fever to 103.2 F. Already has been cultured and on empiric Abx Rx with Vancomycin and Zosyn. AST, ALT and Bilirubin all normal.   eICU Interventions  Will order: 1. Tylenol liquid 650 mg via tube Q 6 hours PRN Temp > 101.5 F.     Intervention Category Intermediate Interventions: Infection - evaluation and management  Faye Sanfilippo Eugene 10/10/2015, 1:10 AM

## 2015-10-10 NOTE — Progress Notes (Signed)
PULMONARY / CRITICAL CARE MEDICINE   Name: Zachary Walls MRN: [id number] DOB: Mar 23, 1956    ADMISSION DATE:  10/08/2015 CONSULTATION DATE:  10/09/15  REFERRING MD:  Dr. Elnora Morrison, MD  CHIEF COMPLAINT:  Respiratory failure  HISTORY OF PRESENT ILLNESS:   Zachary Walls is a 59 y.o. male with COPD, hypertension, hyperlipidemia, GERD, tobacco abuse, alcohol abuse, coronary artery disease, peripheral vascular disease, history of cardiac arrest, bowel resection with colostomy who presented with respiratory failure due to L HCAP superimposed on COPD.   VITAL SIGNS: BP 181/109 mmHg  Pulse 159  Temp(Src) 100 F (37.8 C) (Core (Comment))  Resp 19  Ht 5\' 7"  (1.702 m)  Wt 51.1 kg (112 lb 10.5 oz)  BMI 17.64 kg/m2  SpO2 93%  VENTILATOR SETTINGS: Vent Mode:  [-] PRVC FiO2 (%):  [40 %-60 %] 40 % Set Rate:  [18 bmp] 18 bmp Vt Set:  [400 mL] 400 mL PEEP:  [8 cmH20-10 cmH20] 10 cmH20 Plateau Pressure:  [17 cmH20-23 cmH20] 21 cmH20  PHYSICAL EXAMINATION: General:  Intubated, sedated, cachectic appearing Neuro:  Tremor and severe agitation with stimulation, moves all ext, pupils small and reactive HEENT:  Dry mucous membranes, clear oropharynx, ETT in place Cardiovascular:  Tachycardic, regular rhythm, no murmurs appreciated Lungs:  Diminished breath sounds, B rhonchi L > R Abdomen:  Soft, NT, ND, ostomy in place Musculoskeletal:  No deformities noted Skin:  No rashes seen  LABS:  BMET  Recent Labs Lab 10/08/15 2208 10/09/15 0700 10/10/15 0216  NA 143 141 138  K 5.4* 4.3 3.7  CL 89* 95* 93*  CO2 44* 40* 38*  BUN 31* 21* 16  CREATININE 1.13 0.70 0.82  GLUCOSE 184* 87 117*    Electrolytes  Recent Labs Lab 10/08/15 2208 10/09/15 0700 10/10/15 0216  CALCIUM 9.4 8.5* 7.7*  MG  --  1.7  --   PHOS  --  <1.0*  --     CBC  Recent Labs Lab 10/08/15 2208 10/09/15 0700 10/10/15 0216  WBC 12.7* 12.0* 11.1*  HGB 11.2* 9.8* 8.2*  HCT 38.8* 32.5* 26.7*  PLT 233 209  207    Coag's No results for input(s): APTT, INR in the last 168 hours.  Sepsis Markers  Recent Labs Lab 10/08/15 2215 10/09/15 0214  LATICACIDVEN 8.00* 1.5    ABG  Recent Labs Lab 10/09/15 0018 10/09/15 0431 10/10/15 0436  PHART 7.468* 7.448 7.423  PCO2ART 57.8* 68.6* 58.2*  PO2ART 52.0* 142.0* 46.1*    Liver Enzymes  Recent Labs Lab 10/08/15 2208  AST 30  ALT 19  ALKPHOS 67  BILITOT 1.0  ALBUMIN 2.9*    Cardiac Enzymes  Recent Labs Lab 10/08/15 2208  TROPONINI 0.04*    Glucose  Recent Labs Lab 10/08/15 2204 10/09/15 0734 10/09/15 1630  GLUCAP 138* 83 100*    Imaging Dg Chest Port 1 View  10/10/2015  CLINICAL DATA:  Acute respiratory failure, ventilator dependent, history of COPD, tracheostomy patient. EXAM: PORTABLE CHEST 1 VIEW COMPARISON:  Portable chest x-ray of October 09, 2015. FINDINGS: The lungs are well-expanded. There is persistent confluent interstitial and alveolar opacity on the left perhaps slightly improved since yesterday's study. There is left lower lobe atelectasis and small left pleural effusion. On the right minimal increased interstitial markings are present without alveolar opacities. Density in the right upper lobe is stable. The heart and pulmonary vascularity are normal. External pacemaker defibrillator pads are present. The endotracheal tube tip lies approximately 6.3 cm above  the carina. The esophagogastric tube tip projects below the inferior margin of the image. IMPRESSION: Slight interval improvement in the left-sided pneumonia. Stable findings elsewhere. The support tubes are in reasonable position. Electronically Signed   By: David  Martinique M.D.   On: 10/10/2015 07:16   DISCUSSION: Zachary Walls is a 59 y.o. male with multiple chronic medical illnesses including substance abuse, bowel resection s/p colostomy and COPD on oxygen admitted with L HCAP and VDRF, encephalopathy.   ASSESSMENT / PLAN:  PULMONARY A: Acute  hypoxic respiratory failure  Chronic COPD with hypercapnia Partial collapse of the left lung from suspected mucous plug, improved Left HCAP P:   - Continue MV, wean PEEP and Fio2 as able. Decompensation 12/19 in setting of stimulation and agitation - very labile - Consider bronchoscopy for airway inspection particularly if L volume loss. Of note, he has a 7.0 ETT which will have to be upsized to fit a bronchoscope. Believe we can defer at this time.  - follow CXR - duoneb scheduled - will add solumedrol 12/19 although very little wheeze noted on exam because he has no reserve - he may require trach to be extubated give severity of his lung disease and his agitation   CARDIOVASCULAR A:  Septic shock from pneumonia, improved Recurrent hypotension 12/18 - 12/19 in setting sedating meds and probable auto-PEEP P:  - Holding home anti-hypertensive medications - careful with sedation, see neuro  RENAL A:   Acute kidney injury - Likely prerenal azotemia from poor PO intake + sepsis. S Cr and Urine output improved after fluids. P:   - follow BMP and UOP  GASTROINTESTINAL A:   Hx colostomy, poor protein intake with chronic malnutrition P:   - Clinical nutrition consult - start TF's   HEMATOLOGIC A:   No issues P:  - DVT prophylaxis  > change to heparin q8h  INFECTIOUS A:   L Healthcare associated pneumonia  P:   - Vanc and Zosyn - Follow up blood, urine and sputum cultures  NEUROLOGIC A:   Acute encephalopathy - Agitation P:   - RASS goal: -1 - Note severe agitation with stimulation, then hypotension with sedation. Will increase ceiling on fentanyl gtt, add precedex gtt to intermittent versed.   FAMILY  - Updates: Spoke with pt's daughter 12/18 on phone.   - Inter-disciplinary family meet or Palliative Care meeting due by:  10/16/15   Independent CC time 56 minutes   Zachary Apo, MD, PhD 10/10/2015, 8:29 AM [facility] [phone] or if  no answer [phone]

## 2015-10-10 NOTE — Progress Notes (Signed)
[facility] Progress Note Patient Name: Zachary Walls DOB: 10/29/1955 MRN: [id number]   Date of Service  10/10/2015  HPI/Events of Note  ABG reported as 7.42/58/46.1/37. Venous gas? However, his sat is currently 86%.  eICU Interventions  Will increase the PEEP from 8 to 10. Follow oximeter.      Intervention Category Major Interventions: Respiratory failure - evaluation and management  Sloan Galentine Eugene 10/10/2015, 5:27 AM

## 2015-10-10 NOTE — Progress Notes (Signed)
[facility] Progress Note Patient Name: Zachary Walls DOB: Dec 22, 1955 MRN: [id number]   Date of Service  10/10/2015  HPI/Events of Note  Tremors and HR = 161 (Sinus Tachycardia). Currently on a Fentanyl IV infusion. Hx of ETOH abuse. Suspect ETOH W/D.  eICU Interventions  Will order: 1. Thiamine 100 mg IV now and Q day X 3 days.  2. Increase Versed to 2 mg IV Q 1 hour PRN.   If his tremor and tachycardia are not controlled with this intervention, will consider a Versed IV infusion.      Intervention Category Major Interventions: Arrhythmia - evaluation and management Intermediate Interventions: Other:  Lysle Dingwall 10/10/2015, 12:47 AM

## 2015-10-10 NOTE — Progress Notes (Signed)
[facility] Progress Note Patient Name: Zachary Walls DOB: [birth year] MRN: [id number]   Date of Service  10/10/2015  HPI/Events of Note  Hypotension - BP = 70/48. Likely related to sedation and fever (vasodilation).  eICU Interventions  Will bolus with 0.9 NaCl 1 liter IV over 1 hour now.      Intervention Category Major Interventions: Hypotension - evaluation and management  Beckett Maden Eugene 10/10/2015, 1:59 AM

## 2015-10-10 NOTE — Progress Notes (Signed)
Blood gas done this morning by RT, results indicating possibly venous gas not arterial. Patient's ETT secretions noted to be now bloody. Dr. Oletta Darter notified.

## 2015-10-10 NOTE — Progress Notes (Signed)
During initial assessment, pt began shaking uncontrollably, questionably shivering with core temp of 37.5, warm blanket applied. Pt became tachycardic, hypoxic, unable to suction sputum through ETT, although he is able to cough it up. 2mg  versed given, RT paged, at bedside ventilating with ambu bag, copious serosanguinous fluid coming from ETT. Pt continued to shake violently, eyes rolled back in head, Dr Lamonte Sakai paged, en route.

## 2015-10-10 NOTE — Progress Notes (Signed)
RT called to pt's room due to Sat in the 70%. Increased FIO2 to 60% after suctioning. Sat recovered to 92%. RN notified MD. MD came to bedside.

## 2015-10-10 NOTE — Progress Notes (Signed)
Patient's SBP noted to be decreased to 60s 70s. Fentanyl infusion paused for now, Dr. Oletta Darter notified. Order for NS 1 liter bolus.

## 2015-10-11 ENCOUNTER — Inpatient Hospital Stay (HOSPITAL_COMMUNITY): Payer: Medicaid Other

## 2015-10-11 DIAGNOSIS — J440 Chronic obstructive pulmonary disease with acute lower respiratory infection: Secondary | ICD-10-CM

## 2015-10-11 LAB — CULTURE, RESPIRATORY

## 2015-10-11 LAB — GLUCOSE, CAPILLARY
GLUCOSE-CAPILLARY: 154 mg/dL — AB (ref 65–99)
GLUCOSE-CAPILLARY: 157 mg/dL — AB (ref 65–99)
GLUCOSE-CAPILLARY: 136 mg/dL — AB (ref 65–99)
GLUCOSE-CAPILLARY: 133 mg/dL — AB (ref 65–99)
Glucose-Capillary: 119 mg/dL — ABNORMAL HIGH (ref 65–99)
Glucose-Capillary: 154 mg/dL — ABNORMAL HIGH (ref 65–99)
Glucose-Capillary: 159 mg/dL — ABNORMAL HIGH (ref 65–99)

## 2015-10-11 LAB — CBC
HEMATOCRIT: 31.7 % — AB (ref 39.0–52.0)
HEMOGLOBIN: 10.2 g/dL — AB (ref 13.0–17.0)
MCH: 29.4 pg (ref 26.0–34.0)
MCHC: 32.2 g/dL (ref 30.0–36.0)
MCV: 91.4 fL (ref 78.0–100.0)
Platelets: 248 10*3/uL (ref 150–400)
RBC: 3.47 MIL/uL — AB (ref 4.22–5.81)
RDW: 14.5 % (ref 11.5–15.5)
WBC: 13.3 10*3/uL — ABNORMAL HIGH (ref 4.0–10.5)

## 2015-10-11 LAB — BASIC METABOLIC PANEL
ANION GAP: 6 (ref 5–15)
BUN: 15 mg/dL (ref 6–20)
CHLORIDE: 97 mmol/L — AB (ref 101–111)
CO2: 35 mmol/L — AB (ref 22–32)
Calcium: 8.3 mg/dL — ABNORMAL LOW (ref 8.9–10.3)
Creatinine, Ser: 0.73 mg/dL (ref 0.61–1.24)
GFR calc Af Amer: >60 mL/min (ref >60)
GFR calc non Af Amer: >60 mL/min (ref >60)
GLUCOSE: 172 mg/dL — AB (ref 65–99)
POTASSIUM: 3.3 mmol/L — AB (ref 3.5–5.1)
Sodium: 138 mmol/L (ref 135–145)

## 2015-10-11 LAB — PREALBUMIN: Prealbumin: 3.8 mg/dL — ABNORMAL LOW (ref 18–38)

## 2015-10-11 LAB — CULTURE, RESPIRATORY W GRAM STAIN

## 2015-10-11 LAB — PHOSPHORUS: Phosphorus: 2.9 mg/dL (ref 2.5–4.6)

## 2015-10-11 MED ORDER — POTASSIUM CHLORIDE 20 MEQ/15ML (10%) PO SOLN
40.0000 meq | Freq: Once | ORAL | Status: AC
  Administered 2015-10-11: 40 meq
  Filled 2015-10-11: qty 30

## 2015-10-11 MED ORDER — HEPARIN SODIUM (PORCINE) 5000 UNIT/ML IJ SOLN
5000.0000 [IU] | Freq: Three times a day (TID) | INTRAMUSCULAR | Status: DC
  Administered 2015-10-11 – 2015-10-20 (×28): 5000 [IU] via SUBCUTANEOUS
  Filled 2015-10-11 (×28): qty 1

## 2015-10-11 MED ORDER — DEXMEDETOMIDINE HCL IN NACL 400 MCG/100ML IV SOLN
0.4000 ug/kg/h | INTRAVENOUS | Status: DC
  Administered 2015-10-11 – 2015-10-12 (×2): 1.2 ug/kg/h via INTRAVENOUS
  Administered 2015-10-12: 1 ug/kg/h via INTRAVENOUS
  Administered 2015-10-12 (×2): 1.2 ug/kg/h via INTRAVENOUS
  Filled 2015-10-11 (×5): qty 100

## 2015-10-11 NOTE — Progress Notes (Signed)
PT was switched tp PSV/CPAP twice today. PT had no PT effort on both attempts

## 2015-10-11 NOTE — Progress Notes (Signed)
RT called to room for patient desat to mid 80's. RT lavaged patient and was able to get out a moderate amount of bloody secretions. Patient sats improved to 96.

## 2015-10-11 NOTE — Progress Notes (Signed)
[facility] Progress Note Patient Name: Zachary Walls DOB: November 08, 1955 MRN: [id number]   Date of Service  10/11/2015  HPI/Events of Note  K+ = 3.3 and Creatinine = 0.73.  eICU Interventions  Will replete K+.     Intervention Category Intermediate Interventions: Electrolyte abnormality - evaluation and management  Jaevian Shean Eugene 10/11/2015, 6:25 AM

## 2015-10-11 NOTE — Progress Notes (Signed)
PULMONARY / CRITICAL CARE MEDICINE   Name: Zachary Walls MRN: [id number] DOB: Feb 20, 1956    ADMISSION DATE:  10/08/2015 CONSULTATION DATE:  10/09/15  REFERRING MD:  Dr. Elnora Morrison, MD  CHIEF COMPLAINT:  Respiratory failure  HISTORY OF PRESENT ILLNESS:   Zachary Walls is a 59 y.o. male with COPD, hypertension, hyperlipidemia, GERD, tobacco abuse, alcohol abuse, coronary artery disease, peripheral vascular disease, history of cardiac arrest, bowel resection with colostomy who presented with respiratory failure due to L HCAP superimposed on COPD.   SUBJECTIVE / INTERVAL EVENTS:  Pt has required periodic suctioning for desats, some bloody secretions noted.  Started on norepi 12/19, now weaned to off FiO2 0.40, PEEP 5    VITAL SIGNS: BP 124/75 mmHg  Pulse 69  Temp(Src) 99.1 F (37.3 C) (Core (Comment))  Resp 18  Ht 5\' 7"  (1.702 m)  Wt 50.4 kg (111 lb 1.8 oz)  BMI 17.40 kg/m2  SpO2 93%  VENTILATOR SETTINGS: Vent Mode:  [-] PRVC FiO2 (%):  [40 %-50 %] 40 % Set Rate:  [18 bmp] 18 bmp Vt Set:  [400 mL] 400 mL PEEP:  [5 cmH20] 5 cmH20 Plateau Pressure:  [13 cmH20-20 cmH20] 13 cmH20  PHYSICAL EXAMINATION: General:  Intubated, sedated, cachectic appearing Neuro:  Sedated and calm, moves all ext, pupils small and reactive HEENT:  Dry mucous membranes, clear oropharynx, ETT in place Cardiovascular:  Tachycardic, regular rhythm, no murmurs appreciated Lungs:  Diminished breath sounds, improved BS bilaterally, no wheeze Abdomen:  Soft, NT, ND, ostomy in place Musculoskeletal:  No deformities noted Skin:  No rashes seen  LABS:  BMET  Recent Labs Lab 10/09/15 0700 10/10/15 0216 10/11/15 0350  NA 141 138 138  K 4.3 3.7 3.3*  CL 95* 93* 97*  CO2 40* 38* 35*  BUN 21* 16 15  CREATININE 0.70 0.82 0.73  GLUCOSE 87 117* 172*    Electrolytes  Recent Labs Lab 10/09/15 0700 10/10/15 0216 10/11/15 0350  CALCIUM 8.5* 7.7* 8.3*  MG 1.7  --   --   PHOS <1.0*  --  2.9     CBC  Recent Labs Lab 10/09/15 0700 10/10/15 0216 10/11/15 0350  WBC 12.0* 11.1* 13.3*  HGB 9.8* 8.2* 10.2*  HCT 32.5* 26.7* 31.7*  PLT 209 207 248    Coag's No results for input(s): APTT, INR in the last 168 hours.  Sepsis Markers  Recent Labs Lab 10/08/15 2215 10/09/15 0214  LATICACIDVEN 8.00* 1.5    ABG  Recent Labs Lab 10/09/15 0018 10/09/15 0431 10/10/15 0436  PHART 7.468* 7.448 7.423  PCO2ART 57.8* 68.6* 58.2*  PO2ART 52.0* 142.0* 46.1*    Liver Enzymes  Recent Labs Lab 10/08/15 2208  AST 30  ALT 19  ALKPHOS 67  BILITOT 1.0  ALBUMIN 2.9*    Cardiac Enzymes  Recent Labs Lab 10/08/15 2208  TROPONINI 0.04*    Glucose  Recent Labs Lab 10/09/15 1630 10/10/15 1618 10/10/15 2012 10/11/15 0008 10/11/15 0348 10/11/15 0824  GLUCAP 100* 137* 149* 119* 154* 157*    Imaging Dg Chest Port 1 View  10/11/2015  CLINICAL DATA:  Acute respiratory failure. EXAM: PORTABLE CHEST 1 VIEW COMPARISON:  10/10/2015. FINDINGS: Endotracheal tube and NG tube in stable position. Stable cardiomegaly. Diffuse left lung infiltrate and/or edema with slight increase in aeration of the left lung from prior exam. Mild infiltrate and/or edema right base. Small left pleural effusion cannot be excluded. No pneumothorax. IMPRESSION: 1. Persistent diffuse left lung infiltrate/edema with slight  slight improvement of aeration from prior exam. Small left pleural effusion. 2. Mild infiltrate and/or edema noted in the right lung base. 3. Stable cardiomegaly . Electronically Signed   By: Marcello Moores  Register   On: 10/11/2015 07:18   DISCUSSION: Zachary Walls is a 59 y.o. male with multiple chronic medical illnesses including substance abuse, bowel resection s/p colostomy and COPD on oxygen admitted with L HCAP and VDRF, encephalopathy.   ASSESSMENT / PLAN:  PULMONARY A: Acute hypoxic respiratory failure  Chronic COPD with hypercapnia Partial collapse of the left lung from  suspected mucous plug, improved Left HCAP P:   - Continue MV, goal attempt SBT's today if agitation will permit - Defer FOB at this time - follow CXR > slight improvement L PNA on 12/20 - duoneb scheduled - Added solumedrol 12/19 although very little wheeze noted on exam because he has no reserve - he may require trach to be extubated give severity of his lung disease and his agitation   CARDIOVASCULAR A:  Septic shock from pneumonia, improved Recurrent hypotension 12/18 - 12/19 in setting sedating meds and probable auto-PEEP P:  - Holding home anti-hypertensive medications - careful with sedation, see neuro - norepi weaned to off  RENAL A:   Acute kidney injury - Likely prerenal azotemia from poor PO intake + sepsis. S Cr and Urine output improved after fluids. P:   - follow BMP and UOP  GASTROINTESTINAL A:   Hx colostomy, poor protein intake with chronic malnutrition P:   - Clinical nutrition consult - continue TF's   HEMATOLOGIC A:   Blood-tinged resp secretions.  P:  - DVT prophylaxis  > change enoxaparin to heparin q8h  INFECTIOUS A:   L Healthcare associated pneumonia  P:   Resp 12/18 >> few candida Blood 12/18 >>  - Vanc and Zosyn - Follow up blood, urine and sputum cultures  NEUROLOGIC A:   Acute encephalopathy - Agitation P:   - RASS goal: -1 - Severe agitation with stimulation on 12/19 with associated desat, then hypotension with sedation. Continue current fentanyl and precedex gtt's  FAMILY  - Updates: Spoke with pt's daughter 12/18 on phone.   - Inter-disciplinary family meet or Palliative Care meeting due by:  10/16/15   Independent CC time 79 minutes   [name], MD, PhD 10/11/2015, 9:15 AM [facility] [phone] or if no answer [phone]

## 2015-10-11 NOTE — Progress Notes (Signed)
[facility] ADULT ICU REPLACEMENT PROTOCOL FOR AM LAB REPLACEMENT ONLY  The patient does not apply for the [facility] Adult ICU Electrolyte Replacment Protocol based on the criteria listed below:   1. Is GFR >/= 40 ml/min? Yes.    Patient's GFR today is >60 2. Is urine output >/= 0.5 ml/kg/hr for the last 6 hours? No. Patient's UOP is 0.2 ml/kg/hr 3. Is BUN < 60 mg/dL? Yes.    Patient's BUN today is 15 4. Abnormal electrolyte(s):  5. Ordered repletion with: NA 6. If a panic level lab has been reported, has the CCM MD in charge been notified? Yes.  .   Physician:  Arlester Marker, Eddie Dibbles Hilliard 10/11/2015 5:16 AM

## 2015-10-12 ENCOUNTER — Inpatient Hospital Stay (HOSPITAL_COMMUNITY): Payer: Medicaid Other

## 2015-10-12 DIAGNOSIS — Z9911 Dependence on respirator [ventilator] status: Secondary | ICD-10-CM

## 2015-10-12 DIAGNOSIS — J9600 Acute respiratory failure, unspecified whether with hypoxia or hypercapnia: Secondary | ICD-10-CM

## 2015-10-12 DIAGNOSIS — I10 Essential (primary) hypertension: Secondary | ICD-10-CM

## 2015-10-12 LAB — BASIC METABOLIC PANEL
ANION GAP: 8 (ref 5–15)
BUN: 21 mg/dL — ABNORMAL HIGH (ref 6–20)
CALCIUM: 8.6 mg/dL — AB (ref 8.9–10.3)
CHLORIDE: 99 mmol/L — AB (ref 101–111)
CO2: 31 mmol/L (ref 22–32)
CREATININE: 0.76 mg/dL (ref 0.61–1.24)
GFR calc non Af Amer: >60 mL/min (ref >60)
Glucose, Bld: 162 mg/dL — ABNORMAL HIGH (ref 65–99)
Potassium: 3.9 mmol/L (ref 3.5–5.1)
SODIUM: 138 mmol/L (ref 135–145)

## 2015-10-12 LAB — GLUCOSE, CAPILLARY
GLUCOSE-CAPILLARY: 177 mg/dL — AB (ref 65–99)
GLUCOSE-CAPILLARY: 146 mg/dL — AB (ref 65–99)
GLUCOSE-CAPILLARY: 136 mg/dL — AB (ref 65–99)
Glucose-Capillary: 117 mg/dL — ABNORMAL HIGH (ref 65–99)
Glucose-Capillary: 199 mg/dL — ABNORMAL HIGH (ref 65–99)
Glucose-Capillary: 181 mg/dL — ABNORMAL HIGH (ref 65–99)

## 2015-10-12 LAB — CBC
HCT: 33.3 % — ABNORMAL LOW (ref 39.0–52.0)
HEMOGLOBIN: 10.9 g/dL — AB (ref 13.0–17.0)
MCH: 30 pg (ref 26.0–34.0)
MCHC: 32.7 g/dL (ref 30.0–36.0)
MCV: 91.7 fL (ref 78.0–100.0)
Platelets: 269 10*3/uL (ref 150–400)
RBC: 3.63 MIL/uL — AB (ref 4.22–5.81)
RDW: 14.6 % (ref 11.5–15.5)
WBC: 16.6 10*3/uL — AB (ref 4.0–10.5)

## 2015-10-12 LAB — VANCOMYCIN, TROUGH: Vancomycin Tr: 12 ug/mL (ref 10.0–20.0)

## 2015-10-12 LAB — MAGNESIUM: MAGNESIUM: 1.8 mg/dL (ref 1.7–2.4)

## 2015-10-12 MED ORDER — MAGNESIUM SULFATE 2 GM/50ML IV SOLN
2.0000 g | Freq: Once | INTRAVENOUS | Status: AC
  Administered 2015-10-12: 2 g via INTRAVENOUS
  Filled 2015-10-12: qty 50

## 2015-10-12 MED ORDER — METOPROLOL TARTRATE 25 MG/10 ML ORAL SUSPENSION
12.5000 mg | Freq: Two times a day (BID) | ORAL | Status: DC
  Administered 2015-10-12 – 2015-10-16 (×5): 12.5 mg
  Filled 2015-10-12 (×6): qty 10

## 2015-10-12 MED ORDER — VANCOMYCIN HCL IN DEXTROSE 1-5 GM/200ML-% IV SOLN
1000.0000 mg | Freq: Two times a day (BID) | INTRAVENOUS | Status: DC
  Administered 2015-10-12 – 2015-10-16 (×8): 1000 mg via INTRAVENOUS
  Filled 2015-10-12 (×9): qty 200

## 2015-10-12 NOTE — Progress Notes (Signed)
ANTIBIOTIC CONSULT NOTE - INITIAL  Pharmacy Consult for Vancomycin and Zosyn Indication: HCAP  Allergies  Allergen Reactions  . Bee Venom Shortness Of Breath and Swelling  . Shellfish Allergy Anaphylaxis  . Codeine Itching and Rash   Patient Measurements: Height: 5\' 7"  (170.2 cm) Weight: 117 lb 1 oz (53.1 kg) IBW/kg (Calculated) : 66.1  Vital Signs: Temp: 97.3 F (36.3 C) (12/21 1000) Temp Source: Core (Comment) (12/21 0800) BP: 131/82 mmHg (12/21 1000) Pulse Rate: 71 (12/21 1000) Intake/Output from previous day: 12/20 0701 - 12/21 0700 In: 2115.6 [I.V.:585.6; NG/GT:1080; IV Piggyback:450] Out: 477 [Urine:477] Intake/Output from this shift: Total I/O In: 62.6 [I.V.:32.6; NG/GT:30] Out: 60 [Urine:60]  Labs:  Recent Labs  10/10/15 0216 10/11/15 0350 10/12/15 0335  WBC 11.1* 13.3* 16.6*  HGB 8.2* 10.2* 10.9*  PLT 207 248 269  CREATININE 0.82 0.73 0.76   Estimated Creatinine Clearance: 74.7 mL/min (by C-G formula based on Cr of 0.76). No results for input(s): VANCOTROUGH, VANCOPEAK, VANCORANDOM, GENTTROUGH, GENTPEAK, GENTRANDOM, TOBRATROUGH, TOBRAPEAK, TOBRARND, AMIKACINPEAK, AMIKACINTROU, AMIKACIN in the last 72 hours.   Microbiology: Recent Results (from the past 720 hour(s))  Blood culture (routine x 2)     Status: None (Preliminary result)   Collection Time: 10/08/15 10:20 PM  Result Value Ref Range Status   Specimen Description BLOOD LEFT WRIST  Final   Special Requests BOTTLES DRAWN AEROBIC ONLY 4CC  Final   Culture NO GROWTH 2 DAYS  Final   Report Status PENDING  Incomplete  Blood culture (routine x 2)     Status: None (Preliminary result)   Collection Time: 10/08/15 10:54 PM  Result Value Ref Range Status   Specimen Description BLOOD RIGHT WRIST  Final   Special Requests BOTTLES DRAWN AEROBIC ONLY 5CC  Final   Culture NO GROWTH 3 DAYS  Final   Report Status PENDING  Incomplete  MRSA PCR Screening     Status: None   Collection Time: 10/09/15  3:19 AM   Result Value Ref Range Status   MRSA by PCR NEGATIVE NEGATIVE Final    Comment:        The GeneXpert MRSA Assay (FDA approved for NASAL specimens only), is one component of a comprehensive MRSA colonization surveillance program. It is not intended to diagnose MRSA infection nor to guide or monitor treatment for MRSA infections.   Culture, respiratory (NON-Expectorated)     Status: None   Collection Time: 10/09/15 10:03 AM  Result Value Ref Range Status   Specimen Description TRACHEAL ASPIRATE  Final   Special Requests NONE  Final   Gram Stain   Final    FEW WBC PRESENT,BOTH PMN AND MONONUCLEAR RARE SQUAMOUS EPITHELIAL CELLS PRESENT NO ORGANISMS SEEN Performed at [facility]    Culture   Final    FEW CANDIDA ALBICANS Performed at [facility]    Report Status 10/11/2015 FINAL  Final    Medical History: Past Medical History  Diagnosis Date  . COPD (chronic obstructive pulmonary disease) ([place])   . History of pneumonia   . Hypertension     a. Normotensive 08/2013 off meds.  [name] HOH (hard of hearing)   . Protein calorie malnutrition ([place])   . ETOH abuse   . Tobacco abuse   . Diverticulitis     a. s/p colon resection and colostomy  . Cardiac arrest ([place])     a. 01/2013, felt to be 2/2 severe COPD req trach;  b. 01/2013 Echo: EF 65-70% w/o rwma.  [name]  Difficult intubation   . CAD (coronary artery disease)     a. Nonobstructive CAD by cath 08/2013 (mod RCA, mid LAD myocardial bridge) - ECG was concerning for inf STEMI but pt ruled out. CP possibly GERD.   [name] Hyponatremia     a. During 08/2013 felt due to polydipsia.  . Polysubstance abuse     a. Tobacco, marijuana, alcoholism.  [name] Anxiety   . Shortness of breath   . Peripheral vascular disease ([place])   . Prostate cancer ([place])    Medications:  See electronic med rec  Assessment: 59 y.o. M presents from SNF with respiratory distress - pt required intubation in the field. Pt with recent hospitalization  12/3-12/6 for encephalopathy. Pt received Vancomycin and Zosyn 2.25 gm in ED ~0200. He has been continued on vancomycin and Zosyn for pneumonia.   Vanc 12/18>>      12/21 VT @1205 : 12 mcg/mL Zosyn 12/18>>  12/17 BCx x4 >> NGx2D 12/18 TA >> few candida 12/18 MRSA PCR negative  12/18 CXR >> diffuse infiltration in the left lung  12/19 CXR >> slight improvement  12/20 CXR >> new infiltrate noted in the right lung 12/21 CXR >> worsening of bilateral alveolar opacities consistent with PNA  WBC has been trending up 11.1 > 13.3 > 16.6 (patient is on steroids), Tmax 99.7, VSS.  Renal fxn improved since admission (SCr 1.13 > 0.76) and remains stable w/ CrCl ~75 mL/min  Goal of Therapy:  Vancomycin trough level 15-20 mcg/ml  Plan:  - Zosyn 3.375gm IV q8h  - Increase to Vancomycin 1000 mg IV q12h given SUBtherapeutic trough and worsening infiltrates on CXR - Will f/u micro data, renal function, and pt's clinical condition - Repeat trough at [place], PharmD Clinical Pharmacy Resident Pager: [phone]  10/12/2015 11:07 AM

## 2015-10-12 NOTE — Progress Notes (Signed)
RN at bedside while patient became increasingly agitated and combative. Patient disconnected ET tube, RN reconnected ET tube. Pt. grabbed RN's hands and foley, RN called for assistance, during this time patient pulled foley out with balloon inflated. MD at bedside. Patient remains agitated and combative, fentanyl restarted. RN continuing to give reorientation, emotional support, and calm reassurance. Orders received and initiated. RN will continue to monitor.

## 2015-10-12 NOTE — Progress Notes (Signed)
[facility] Progress Note Patient Name: Zachary Walls DOB: 04-Mar-1956 MRN: [id number]   Date of Service  10/12/2015  HPI/Events of Note  Pt has waxing waning agitation - very active right now. Will increase ceiling on his precedesx and fentanyl  eICU Interventions       Intervention Category Major Interventions: Delirium, psychosis, severe agitation - evaluation and management  Rosena Bartle S. 10/12/2015, 3:30 PM

## 2015-10-12 NOTE — Progress Notes (Signed)
[place] and MD made aware of pt's continued agitation and attempt at pulling at tubes. - ETT and IV's.  Updated on current sedation medication regime. Orders received. Continuing to give reorientation and calm emotional support.

## 2015-10-12 NOTE — Progress Notes (Signed)
Pt bladder scanned per protocol. O cc noted in bladder. Will re-scan and continue to monitor per protocol.

## 2015-10-12 NOTE — Progress Notes (Signed)
PULMONARY / CRITICAL CARE MEDICINE   Name: Zachary Walls MRN: [id number] DOB: [birth year]    ADMISSION DATE:  10/08/2015 CONSULTATION DATE:  10/09/15  REFERRING MD:  Dr. Elnora Morrison, MD  CHIEF COMPLAINT:  Respiratory failure  HISTORY OF PRESENT ILLNESS:   Zachary Walls is a 59 y.o. male with COPD, hypertension, hyperlipidemia, GERD, tobacco abuse, alcohol abuse, coronary artery disease, peripheral vascular disease, history of cardiac arrest, bowel resection with colostomy who presented with respiratory failure due to L HCAP superimposed on COPD.   SUBJECTIVE / INTERVAL EVENTS:  Severe agitation earlier, failed wean due to apnea.  VITAL SIGNS: BP 154/89 mmHg  Pulse 64  Temp(Src) 97.7 F (36.5 C) (Core (Comment))  Resp 18  Ht 5\' 7"  (1.702 m)  Wt 53.1 kg (117 lb 1 oz)  BMI 18.33 kg/m2  SpO2 96%  VENTILATOR SETTINGS: Vent Mode:  [-] PRVC FiO2 (%):  [40 %] 40 % Set Rate:  [18 bmp] 18 bmp Vt Set:  [400 mL] 400 mL PEEP:  [5 cmH20] 5 cmH20 Plateau Pressure:  [10 cmH20-16 cmH20] 16 cmH20  PHYSICAL EXAMINATION: General:  Intubated, was agitated now sedate, cachectic and chronically ill appearing Neuro: Not following basic commands, moves all extremities to noxious stimulus;  pupils small and reactive HEENT:  Dry mucous membranes, clear oropharynx, ETT in place Cardiovascular:  Mild brady at 58 bmp, regular rhythm, no murmurs appreciated Lungs:  Diminished breath sounds; left>right, mild wheezing  left lung  Abdomen:  Firm, NT, ND, hypoactive bowel sounds, ostomy in place Musculoskeletal:  No deformities noted Skin:  No rashes or lesions  LABS:  BMET  Recent Labs Lab 10/10/15 0216 10/11/15 0350 10/12/15 0335  NA 138 138 138  K 3.7 3.3* 3.9  CL 93* 97* 99*  CO2 38* 35* 31  BUN 16 15 21*  CREATININE 0.82 0.73 0.76  GLUCOSE 117* 172* 162*    Electrolytes  Recent Labs Lab 10/09/15 0700 10/10/15 0216 10/11/15 0350 10/12/15 0335  CALCIUM 8.5* 7.7* 8.3* 8.6*  MG  1.7  --   --  1.8  PHOS <1.0*  --  2.9  --     CBC  Recent Labs Lab 10/10/15 0216 10/11/15 0350 10/12/15 0335  WBC 11.1* 13.3* 16.6*  HGB 8.2* 10.2* 10.9*  HCT 26.7* 31.7* 33.3*  PLT 207 248 269    Coag's No results for input(s): APTT, INR in the last 168 hours.  Sepsis Markers  Recent Labs Lab 10/08/15 2215 10/09/15 0214  LATICACIDVEN 8.00* 1.5    ABG  Recent Labs Lab 10/09/15 0018 10/09/15 0431 10/10/15 0436  PHART 7.468* 7.448 7.423  PCO2ART 57.8* 68.6* 58.2*  PO2ART 52.0* 142.0* 46.1*    Liver Enzymes  Recent Labs Lab 10/08/15 2208  AST 30  ALT 19  ALKPHOS 67  BILITOT 1.0  ALBUMIN 2.9*    Cardiac Enzymes  Recent Labs Lab 10/08/15 2208  TROPONINI 0.04*    Glucose  Recent Labs Lab 10/11/15 1248 10/11/15 1655 10/11/15 1945 10/11/15 2327 10/12/15 0417 10/12/15 0818  GLUCAP 154* 136* 159* 133* 177* 146*    Imaging Dg Chest Port 1 View  10/12/2015  CLINICAL DATA:  Acute respiratory failure, hypoxia, history of COPD EXAM: PORTABLE CHEST 1 VIEW COMPARISON:  Portable chest x-ray of October 11, 2015 FINDINGS: The lungs remain hyperinflated. Confluent alveolar opacity has progressed throughout much of the left lung and in the right infrahilar region. The retrocardiac region on the left remains dense. The left hemidiaphragm is not  completely included in the field of view. The heart is normal in size. The pulmonary vascularity is not engorged. The endotracheal tube tip lies 5.3 cm above the carina. The esophagogastric tube tip projects below the inferior margin of the image. IMPRESSION: Worsening of bilateral alveolar opacities consistent with pneumonia. Underlying COPD is stable. The support tubes are in reasonable position. Electronically Signed   By: David  Martinique M.D.   On: 10/12/2015 07:13   DISCUSSION: Zachary Walls is a 59 y.o. male with multiple chronic medical illnesses including substance abuse, bowel resection s/p colostomy and COPD on  home oxygen admitted with L HCAP and VDRF, encephalopathy. Failed SBT attempts.   ASSESSMENT / PLAN:  PULMONARY A: Acute hypoxic respiratory failure  Chronic COPD with hypercapnia Partial collapse of the left lung from suspected mucous plug, improved HCAP>>worsening bilateral infiltrates on CXR and leukocytosis P:   - Continue Full vent support. - SBT in AM. - Empiric antibiotics. - VAP prevention - Resume SBT attempts as tolerated - Defer FOB at this time - Follow CXR  - Duoneb scheduled - Continue solumedrol - Will need a trial extubation prior to trach.  CARDIOVASCULAR A:  Septic shock from pneumonia-Resolved Recurrent hypotension-Resolved; currently hypertensive with SBPs 150-165 Mild bradycardia due to medication sideeffects P:  - Monitor HR; titrate off precedex if HR falls below 50 - Will restart some home anti-hypertensive medications if SBP remain>160 - Start lopressor today.  RENAL A:   Acute kidney injury - Resolved P:   - Follow BMP and UOP  GASTROINTESTINAL A:   Hx colostomy, poor protein intake with chronic malnutrition P:   - Continue TF's per clinical nutrition recs - Aspiration precautions  HEMATOLOGIC A:   Blood-tinged resp secretions-Resolved  P:  - DVT prophylaxis with  heparin q8h  INFECTIOUS A:   L Healthcare associated pneumonia  Worsening leukocytosis on empiric antibiotics; afebrile; might be steroid related P:   Resp 12/18 >> few candida Blood 12/18 >> - Continue Vanc and Zosyn - Follow up blood, urine and sputum cultures - f/u WBC  NEUROLOGIC A:   Acute encephalopath-ETOH withdrawal versus septic  Severe agitation with stimulation on 12/19 with associated desat, then hypotension with sedation-Improved P:   - RASS goal: -1 - Continue  Fentanyl and precedex gtt's - Folate and thiamine - Will consider adding lorazepam if persistent agitation/anxiety  FAMILY  - Updates:Family last updated 12/18; no family available  12/21.Will discuss need for trach with family if patient continues to fail SBT attempts   - Inter-disciplinary family meet or Palliative Care meeting due by:  10/16/15  Critical care time spent examining patient, establishing treatment plan, managing vent, reviewing history and labs, CXR, and ABG interpretation is 35 minutes  Magdalene S. Tukov ANP-BC Pulmonary and [place] Pager: [phone]  Attending Note:  59 year old with multiple medical problems, now intubated for HCAP.  Very agitated earlier today and pulled his own foley catheter out.  On exam, distant and diminished BS.  Not quite ready for extubation at this time.  Begin PS trials but no extubation given mental status.  Will attempt a trial extubation prior to proceeding with trach.  Will extubate in AM and if fails then will trach on Friday.  I edited above not in full.  Agree with plan above and reflects my findings.  The patient is critically ill with multiple organ systems failure and requires high complexity decision making for assessment and support, frequent evaluation and  titration of therapies, application of advanced monitoring technologies and extensive interpretation of multiple databases.   Critical Care Time devoted to patient care services described in this note is  35  Minutes. This time reflects time of care of this signee Dr Jennet Maduro. This critical care time does not reflect procedure time, or teaching time or supervisory time of PA/NP/Med student/Med Resident etc but could involve care discussion time.  Rush Farmer, M.D. [facility] Pulmonary/Critical Care Medicine. Pager: [phone]. After hours pager: [phone].

## 2015-10-13 ENCOUNTER — Inpatient Hospital Stay (HOSPITAL_COMMUNITY): Payer: Medicaid Other

## 2015-10-13 LAB — CBC
HCT: 31.6 % — ABNORMAL LOW (ref 39.0–52.0)
HEMOGLOBIN: 10.4 g/dL — AB (ref 13.0–17.0)
MCH: 29.9 pg (ref 26.0–34.0)
MCHC: 32.9 g/dL (ref 30.0–36.0)
MCV: 90.8 fL (ref 78.0–100.0)
PLATELETS: 314 10*3/uL (ref 150–400)
RBC: 3.48 MIL/uL — AB (ref 4.22–5.81)
RDW: 14.7 % (ref 11.5–15.5)
WBC: 13.4 10*3/uL — AB (ref 4.0–10.5)

## 2015-10-13 LAB — POCT I-STAT 3, ART BLOOD GAS (G3+)
ACID-BASE EXCESS: 10 mmol/L — AB (ref 0.0–2.0)
Bicarbonate: 34.9 mEq/L — ABNORMAL HIGH (ref 20.0–24.0)
O2 SAT: 96 %
PH ART: 7.49 — AB (ref 7.350–7.450)
Patient temperature: 97.4
TCO2: 36 mmol/L (ref 0–100)
pCO2 arterial: 45.6 mmHg — ABNORMAL HIGH (ref 35.0–45.0)
pO2, Arterial: 75 mmHg — ABNORMAL LOW (ref 80.0–100.0)

## 2015-10-13 LAB — GLUCOSE, CAPILLARY
GLUCOSE-CAPILLARY: 151 mg/dL — AB (ref 65–99)
Glucose-Capillary: 120 mg/dL — ABNORMAL HIGH (ref 65–99)
Glucose-Capillary: 101 mg/dL — ABNORMAL HIGH (ref 65–99)

## 2015-10-13 LAB — BASIC METABOLIC PANEL
ANION GAP: 8 (ref 5–15)
BUN: 21 mg/dL — ABNORMAL HIGH (ref 6–20)
CHLORIDE: 100 mmol/L — AB (ref 101–111)
CO2: 30 mmol/L (ref 22–32)
Calcium: 8.3 mg/dL — ABNORMAL LOW (ref 8.9–10.3)
Creatinine, Ser: 0.67 mg/dL (ref 0.61–1.24)
GFR calc Af Amer: >60 mL/min (ref >60)
GLUCOSE: 134 mg/dL — AB (ref 65–99)
POTASSIUM: 3.7 mmol/L (ref 3.5–5.1)
SODIUM: 138 mmol/L (ref 135–145)

## 2015-10-13 LAB — CULTURE, BLOOD (ROUTINE X 2): Culture: NO GROWTH

## 2015-10-13 LAB — PHOSPHORUS: Phosphorus: 2.9 mg/dL (ref 2.5–4.6)

## 2015-10-13 LAB — MAGNESIUM: MAGNESIUM: 2.2 mg/dL (ref 1.7–2.4)

## 2015-10-13 MED ORDER — DEXMEDETOMIDINE HCL IN NACL 200 MCG/50ML IV SOLN: 0.4000 ug/kg/h | INTRAVENOUS | Status: DC

## 2015-10-13 NOTE — Procedures (Signed)
Extubation Procedure Note  Patient Details:   Name: Zachary Walls DOB: January 17, 1956 MRN: [id number]   Airway Documentation:     Evaluation  O2 sats: stable throughout Complications: No apparent complications Patient did tolerate procedure well. Bilateral Breath Sounds: Clear, Diminished Suctioning: Oral, Airway No  PT was extubated to 5L [place]  sats are stable. PT was not able to talk, but was able to motion with his hands  Elimelech Houseman, Leonie Douglas 10/13/2015, 11:03 AM

## 2015-10-13 NOTE — Progress Notes (Signed)
Foley inserted for urinary retention, patient has not voided in more than 12 hours, belly is hard.  Bladder scan reveal >100 ml urine.
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
## 2015-10-13 NOTE — Progress Notes (Signed)
Patient extubated today and is currently on 5 L/min of oxygen. CSW updated Admissions at [facility].  Hopefully patient will continue to improve to be able to return to the facility when stable.  CSW services will continue to monitor for date of stability.  Lorie Phenix. Pauline Good, [place]

## 2015-10-13 NOTE — Progress Notes (Signed)
PULMONARY / CRITICAL CARE MEDICINE   Name: Zachary Walls MRN: [id number] DOB: [birth year]    ADMISSION DATE:  10/08/2015 CONSULTATION DATE:  10/09/15  REFERRING MD:  Dr. Elnora Morrison, MD  CHIEF COMPLAINT:  Respiratory failure  HISTORY OF PRESENT ILLNESS:   Zachary Walls is a 59 y.o. male with COPD, hypertension, hyperlipidemia, GERD, tobacco abuse, alcohol abuse, coronary artery disease, peripheral vascular disease, history of cardiac arrest, bowel resection with colostomy who presented with respiratory failure due to L HCAP superimposed on COPD.   SUBJECTIVE / INTERVAL EVENTS:  Alert and interactive this AM.  VITAL SIGNS: BP 103/75 mmHg  Pulse 59  Temp(Src) 97.7 F (36.5 C) (Axillary)  Resp 10  Ht 5\' 7"  (1.702 m)  Wt 53 kg (116 lb 13.5 oz)  BMI 18.30 kg/m2  SpO2 100%  VENTILATOR SETTINGS: Vent Mode:  [-] PRVC FiO2 (%):  [40 %] 40 % Set Rate:  [18 bmp] 18 bmp Vt Set:  [400 mL-550 mL] 550 mL PEEP:  [5 cmH20] 5 cmH20 Pressure Support:  [10 cmH20] 10 cmH20 Plateau Pressure:  [17 cmH20-31 cmH20] 17 cmH20  PHYSICAL EXAMINATION: General:  Intubated, awake and following commands. Neuro: Arousable, moving all ext to command. HEENT:  Dry mucous membranes, clear oropharynx, ETT in place Cardiovascular:  Regular rhythm, no murmurs appreciated Lungs:  Diminished breath sounds; left>right, mild wheezing  left lung  Abdomen:  Firm, NT, ND, hypoactive bowel sounds, ostomy in place Musculoskeletal:  No deformities noted Skin:  No rashes or lesions  LABS:  BMET  Recent Labs Lab 10/11/15 0350 10/12/15 0335 10/13/15 0724  NA 138 138 138  K 3.3* 3.9 3.7  CL 97* 99* 100*  CO2 35* 31 30  BUN 15 21* 21*  CREATININE 0.73 0.76 0.67  GLUCOSE 172* 162* 134*    Electrolytes  Recent Labs Lab 10/09/15 0700  10/11/15 0350 10/12/15 0335 10/13/15 0724  CALCIUM 8.5*  < > 8.3* 8.6* 8.3*  MG 1.7  --   --  1.8 2.2  PHOS <1.0*  --  2.9  --  2.9  < > = values in this interval not  displayed.  CBC  Recent Labs Lab 10/11/15 0350 10/12/15 0335 10/13/15 0724  WBC 13.3* 16.6* 13.4*  HGB 10.2* 10.9* 10.4*  HCT 31.7* 33.3* 31.6*  PLT 248 269 314    Coag's No results for input(s): APTT, INR in the last 168 hours.  Sepsis Markers  Recent Labs Lab 10/08/15 2215 10/09/15 0214  LATICACIDVEN 8.00* 1.5    ABG  Recent Labs Lab 10/09/15 0431 10/10/15 0436 10/13/15 0246  PHART 7.448 7.423 7.490*  PCO2ART 68.6* 58.2* 45.6*  PO2ART 142.0* 46.1* 75.0*    Liver Enzymes  Recent Labs Lab 10/08/15 2208  AST 30  ALT 19  ALKPHOS 67  BILITOT 1.0  ALBUMIN 2.9*    Cardiac Enzymes  Recent Labs Lab 10/08/15 2208  TROPONINI 0.04*    Glucose  Recent Labs Lab 10/12/15 1226 10/12/15 1602 10/12/15 1958 10/12/15 2316 10/13/15 0325 10/13/15 0740  GLUCAP 117* 136* 199* 181* 151* 120*    Imaging Dg Chest Port 1 View  10/13/2015  CLINICAL DATA:  Endotracheal tube EXAM: PORTABLE CHEST 1 VIEW COMPARISON:  Yesterday FINDINGS: Endotracheal tube tip is between the clavicular heads and carina, stable from prior. The orogastric tube tip at least reaches the stomach. Unchanged pneumonia throughout most of the left lung and at the right base. Probable trace pleural effusions. There is a background of emphysema. Normal  heart size and stable mediastinal contours. IMPRESSION: 1. Unremarkable positioning of endotracheal and orogastric tubes. 2. Unchanged bilateral pneumonia. 3. Emphysema. Electronically Signed   By: Monte Fantasia M.D.   On: 10/13/2015 04:55   DISCUSSION: Zachary Walls is a 59 y.o. male with multiple chronic medical illnesses including substance abuse, bowel resection s/p colostomy and COPD on home oxygen admitted with L HCAP and VDRF, encephalopathy. Failed SBT attempts.   ASSESSMENT / PLAN:  PULMONARY A: Acute hypoxic respiratory failure  Chronic COPD with hypercapnia Partial collapse of the left lung from suspected mucous plug,  improved HCAP>>worsening bilateral infiltrates on CXR and leukocytosis P:   - Trial extubation today, if fails then will reintubate and trach in AM. - Empiric antibiotics. - VAP prevention - Defer FOB at this time - Follow CXR  - Duoneb scheduled - Continue solumedrol at 60 mg q6 hours.  CARDIOVASCULAR A:  Septic shock from pneumonia-Resolved Recurrent hypotension-Resolved; currently hypertensive with SBPs 150-165 Mild bradycardia due to medication sideeffects P:  - Monitor HR and d/c precedex, causes bradycardia. - Continue lopressor at current dose.  RENAL A:   Acute kidney injury - Resolved P:   - Follow BMP and UOP - Replace electrolytes as indicated. - KVO IVF.  GASTROINTESTINAL A:   Hx colostomy, poor protein intake with chronic malnutrition P:   - D/C TF. - Swallow evaluation. - Aspiration precautions  HEMATOLOGIC A:   Blood-tinged resp secretions-Resolved  P:  - DVT prophylaxis with  heparin q8h  INFECTIOUS A:   L Healthcare associated pneumonia  Worsening leukocytosis on empiric antibiotics; afebrile; might be steroid related P:   Resp 12/18 >> few candida Blood 12/18 >>NTD - Continue Vanc and Zosyn - Follow up blood, urine and sputum cultures - F/u WBC  NEUROLOGIC A:   Acute encephalopath-ETOH withdrawal versus septic  Severe agitation with stimulation on 12/19 with associated desat, then hypotension with sedation-Improved P:   - Discontinue  Fentanyl and precedex gtt's - Folate and thiamine. - D/C versed.  FAMILY  - Updates: Family last updated 12/18; no family available 12/22.  Trial extubation today, if fails then will reintubate and trach in AM.   - Inter-disciplinary family meet or Palliative Care meeting due by:  10/16/15  The patient is critically ill with multiple organ systems failure and requires high complexity decision making for assessment and support, frequent evaluation and titration of therapies, application of advanced  monitoring technologies and extensive interpretation of multiple databases.   Critical Care Time devoted to patient care services described in this note is  35  Minutes. This time reflects time of care of this signee Dr Jennet Maduro. This critical care time does not reflect procedure time, or teaching time or supervisory time of PA/NP/Med student/Med Resident etc but could involve care discussion time.  Rush Farmer, M.D. [facility] Pulmonary/Critical Care Medicine. Pager: [phone]. After hours pager: [phone].

## 2015-10-13 NOTE — Progress Notes (Signed)
Pt bladder scanned due to no urine output after foley removal earlier. Less than 15 cc noted in the bladder. Will cont to monitor.

## 2015-10-13 NOTE — Consult Note (Addendum)
WOC ostomy consult note Stoma type/location:  Consult requested for ostomy pouching assistance. Colostomy has been in place since 2013 to LLQ according to the EMR.  Stomal assessment/size:  Stoma red and viable, 1 3/4 inches, above skin level Peristomal assessment: Intact skin surrounding, peristomal hernia visible. Output: Scant amt brown liquid stool in the pouch  Ostomy pouching: 1pc Education provided: Pt is sedated and on the vent and was unable to participate. He has a flexible one piece pouch which is a brand we do not carry in the [place].  Applied similar one piece pouch and extra supplies ordered to bedside for staff nurse use. Please re-consult if further assistance is needed.  Thank-you,  Julien Girt MSN, [place], [place], [place], [place]

## 2015-10-14 ENCOUNTER — Inpatient Hospital Stay (HOSPITAL_COMMUNITY): Payer: Medicaid Other

## 2015-10-14 LAB — CBC
HEMATOCRIT: 34 % — AB (ref 39.0–52.0)
Hemoglobin: 10.6 g/dL — ABNORMAL LOW (ref 13.0–17.0)
MCH: 29.4 pg (ref 26.0–34.0)
MCHC: 31.2 g/dL (ref 30.0–36.0)
MCV: 94.4 fL (ref 78.0–100.0)
PLATELETS: 377 10*3/uL (ref 150–400)
RBC: 3.6 MIL/uL — AB (ref 4.22–5.81)
RDW: 15 % (ref 11.5–15.5)
WBC: 16 10*3/uL — AB (ref 4.0–10.5)

## 2015-10-14 LAB — CULTURE, BLOOD (ROUTINE X 2): Culture: NO GROWTH

## 2015-10-14 LAB — GLUCOSE, CAPILLARY
GLUCOSE-CAPILLARY: 124 mg/dL — AB (ref 65–99)
GLUCOSE-CAPILLARY: 134 mg/dL — AB (ref 65–99)
GLUCOSE-CAPILLARY: 85 mg/dL (ref 65–99)
Glucose-Capillary: 138 mg/dL — ABNORMAL HIGH (ref 65–99)
Glucose-Capillary: 110 mg/dL — ABNORMAL HIGH (ref 65–99)
Glucose-Capillary: 106 mg/dL — ABNORMAL HIGH (ref 65–99)

## 2015-10-14 LAB — MAGNESIUM: Magnesium: 2.1 mg/dL (ref 1.7–2.4)

## 2015-10-14 LAB — BASIC METABOLIC PANEL
ANION GAP: 8 (ref 5–15)
BUN: 19 mg/dL (ref 6–20)
CALCIUM: 8.8 mg/dL — AB (ref 8.9–10.3)
CO2: 34 mmol/L — AB (ref 22–32)
Chloride: 99 mmol/L — ABNORMAL LOW (ref 101–111)
Creatinine, Ser: 0.69 mg/dL (ref 0.61–1.24)
GFR calc non Af Amer: >60 mL/min (ref >60)
Glucose, Bld: 116 mg/dL — ABNORMAL HIGH (ref 65–99)
POTASSIUM: 4.3 mmol/L (ref 3.5–5.1)
Sodium: 141 mmol/L (ref 135–145)

## 2015-10-14 LAB — PHOSPHORUS: PHOSPHORUS: 2.4 mg/dL — AB (ref 2.5–4.6)

## 2015-10-14 MED ORDER — CETYLPYRIDINIUM CHLORIDE 0.05 % MT LIQD
7.0000 mL | Freq: Two times a day (BID) | OROMUCOSAL | Status: DC
  Administered 2015-10-14 – 2015-10-20 (×11): 7 mL via OROMUCOSAL

## 2015-10-14 MED ORDER — PREDNISONE 5 MG/5ML PO SOLN
30.0000 mg | Freq: Every day | ORAL | Status: DC
  Filled 2015-10-14 (×3): qty 30

## 2015-10-14 MED ORDER — CHLORHEXIDINE GLUCONATE 0.12 % MT SOLN
15.0000 mL | Freq: Two times a day (BID) | OROMUCOSAL | Status: DC
  Administered 2015-10-14 – 2015-10-20 (×13): 15 mL via OROMUCOSAL
  Filled 2015-10-14 (×13): qty 15

## 2015-10-14 MED ORDER — FUROSEMIDE 10 MG/ML IJ SOLN
40.0000 mg | Freq: Once | INTRAMUSCULAR | Status: AC
  Administered 2015-10-14: 40 mg via INTRAVENOUS
  Filled 2015-10-14: qty 4

## 2015-10-14 NOTE — Evaluation (Signed)
Physical Therapy Evaluation Patient Details Name: Zachary Walls MRN: [id number] DOB: [birth year] Today's Date: 10/14/2015   History of Present Illness  Zachary Walls is a 59 y.o. male with COPD, hypertension, hyperlipidemia, GERD, tobacco abuse, alcohol abuse, coronary artery disease, peripheral vascular disease, history of cardiac arrest, bowel resection with colostomy who presented with respiratory failure due to L HCAP superimposed on COPD.   Was at [facility] PTA.   Clinical Impression  Pt admitted with above diagnosis. Pt currently with functional limitations due to the deficits listed below (see PT Problem List). Pt was able to be assisted to chair with +2 mod to max assist due to heavy posterior lean. Will need continued SNF with trial of therapy.  Will follow acutely.   Pt will benefit from skilled PT to increase their independence and safety with mobility to allow discharge to the venue listed below.      Follow Up Recommendations SNF;Supervision/Assistance - 24 hour    Equipment Recommendations  Other (comment) (TBA)    Recommendations for Other Services       Precautions / Restrictions Precautions Precautions: Fall Restrictions Weight Bearing Restrictions: No      Mobility  Bed Mobility Overal bed mobility: Needs Assistance;+2 for physical assistance Bed Mobility: Rolling;Sidelying to Sit Rolling: Mod assist;+2 for physical assistance Sidelying to sit: Mod assist;+2 for physical assistance       General bed mobility comments: Pt neeeded assist for LEs and for elevation of trunk.  Pt with tone as well as posterior lean making gettitng to EOB difficult as pt leaning back working against PT.  Transfers Overall transfer level: Needs assistance Equipment used: Rolling walker (2 wheeled) Transfers: Sit to/from Stand Sit to Stand: Mod assist;Max assist;+2 physical assistance         General transfer comment: Pt able to perform sit to stand with +2 assist of bil  HHA with pt with heavy posterior lean.  Pt needed assist to lean forward and incr assist for stability.  Pt with uncoordination of movement and initially unable to move to pivot to chair needing max facilitation of right LE to step around to chair with PT moving right LE around to chair and pt stepping his left LE around.  Needed asssit to control descent into chair.  Spent incr time positioning pt once in chair as pt appeared initially in pain therefore positioned with pillows to incr pt comfort.    Ambulation/Gait                Stairs            Wheelchair Mobility    Modified Rankin (Stroke Patients Only)       Balance Overall balance assessment: Needs assistance;History of Falls Sitting-balance support: Feet supported Sitting balance-Leahy Scale: Zero Sitting balance - Comments: Pt not attempting to use UEs for support with heavy posterior lean.  ATtempts to assist pt to lean forward and sit upright futile needing max to total assist to sit EOB.   Postural control: Posterior lean Standing balance support: Bilateral upper extremity supported;During functional activity Standing balance-Leahy Scale: Poor Standing balance comment: unable to stand with mod assist of 2 persons.  Significant posterior lean.                              Pertinent Vitals/Pain Pain Assessment: Faces Faces Pain Scale: Hurts whole lot Pain Location: unable to state location Pain Descriptors / Indicators:  Grimacing;Guarding Pain Intervention(s): Limited activity within patient's tolerance;Monitored during session;Patient requesting pain meds-RN notified;Repositioned  BP stable.  O2 at 2LO2 at rest 94%.  Sats decr to 87% once inchair therefore incr O2 to 3LO2 with sats 88-92% on departure.  Left pt on 3LO2.  Other VSS.     Home Living Family/patient expects to be discharged to:: Skilled nursing facility   Available Help at Discharge: [place];Available 24 hours/day            Home Equipment: Walker - 2 wheels Additional Comments: Per chart, pt has been at [facility].    Prior Function Level of Independence: Needs assistance   Gait / Transfers Assistance Needed: Unsure  ADL's / Homemaking Assistance Needed: Unsure  Comments: multiple falls reported last admit.       Hand Dominance   Dominant Hand: Right    Extremity/Trunk Assessment   Upper Extremity Assessment: Defer to OT evaluation           Lower Extremity Assessment: RLE deficits/detail;LLE deficits/detail RLE Deficits / Details: grossly 3-/5, tone noted LLE Deficits / Details: grossly 3-/5, tone noted  Cervical / Trunk Assessment: Kyphotic  Communication   Communication: HOH;Expressive difficulties  Cognition Arousal/Alertness: Awake/alert Behavior During Therapy: Restless;Impulsive Overall Cognitive Status: Difficult to assess                      General Comments General comments (skin integrity, edema, etc.): Pt not following commands consistently.  Pt with delayed response time as well.  Appears confused as well.      Exercises        Assessment/Plan    PT Assessment Patient needs continued PT services  PT Diagnosis Difficulty walking;Generalized weakness   PT Problem List Decreased strength;Decreased range of motion;Decreased activity tolerance;Decreased balance;Decreased mobility;Decreased coordination;Decreased cognition;Decreased knowledge of use of DME;Decreased safety awareness  PT Treatment Interventions Gait training;DME instruction;Stair training;Functional mobility training;Therapeutic activities;Therapeutic exercise;Balance training;Cognitive remediation   PT Goals (Current goals can be found in the Care Plan section) Acute Rehab PT Goals Patient Stated Goal: unable to state PT Goal Formulation: Patient unable to participate in goal setting Time For Goal Achievement: 10/28/15 Potential to Achieve Goals: Fair    Frequency Min  2X/week   Barriers to discharge Decreased caregiver support      Co-evaluation               End of Session Equipment Utilized During Treatment: Gait belt;Oxygen Activity Tolerance: Patient limited by fatigue (some confusion) Patient left: in chair;with call bell/phone within reach;with chair alarm set;with restraints reapplied Nurse Communication: Mobility status;Need for lift equipment;Patient requests pain meds         Time: 0925-0940 PT Time Calculation (min) (ACUTE ONLY): 15 min   Charges:   PT Evaluation $Initial PT Evaluation Tier I: 1 Procedure     PT G CodesDenice Paradise 2015/10/15, 11:04 AM  [facility] [phone] (pager)

## 2015-10-14 NOTE — Evaluation (Signed)
Clinical/Bedside Swallow Evaluation Patient Details  Name: Zachary Walls MRN: [id number] Date of Birth: September 25, 1956  Today's Date: 10/14/2015 Time: SLP Start Time (ACUTE ONLY): 0804 SLP Stop Time (ACUTE ONLY): 0815 SLP Time Calculation (min) (ACUTE ONLY): 11 min  Past Medical History:  Past Medical History  Diagnosis Date  . COPD (chronic obstructive pulmonary disease) ([place])   . History of pneumonia   . Hypertension     a. Normotensive 08/2013 off meds.  [name] HOH (hard of hearing)   . Protein calorie malnutrition ([place])   . ETOH abuse   . Tobacco abuse   . Diverticulitis     a. s/p colon resection and colostomy  . Cardiac arrest ([place])     a. 01/2013, felt to be 2/2 severe COPD req trach;  b. 01/2013 Echo: EF 65-70% w/o rwma.  . Difficult intubation   . CAD (coronary artery disease)     a. Nonobstructive CAD by cath 08/2013 (mod RCA, mid LAD myocardial bridge) - ECG was concerning for inf STEMI but pt ruled out. CP possibly GERD.   [name] Hyponatremia     a. During 08/2013 felt due to polydipsia.  . Polysubstance abuse     a. Tobacco, marijuana, alcoholism.  [name] Anxiety   . Shortness of breath   . Peripheral vascular disease ([place])   . Prostate cancer [facility])    Past Surgical History:  Past Surgical History  Procedure Laterality Date  . Hernia repair      inguinal  . Colostomy  12/11/2011    Procedure: COLOSTOMY;  Surgeon: Rolm Bookbinder, MD;  Location: [facility];  Service: General;  Laterality: N/A;  . Colostomy revision  12/11/2011    Procedure: COLON RESECTION SIGMOID;  Surgeon: Rolm Bookbinder, MD;  Location: [facility];  Service: General;;  . Bowel resection  12/11/2011    Procedure: SMALL BOWEL RESECTION;  Surgeon: Rolm Bookbinder, MD;  Location: [facility];  Service: General;;  times 2  . Cystoscopy w/ ureteral stent placement  12/11/2011    Procedure: CYSTOSCOPY WITH STENT REPLACEMENT;  Surgeon: Malka So, MD;  Location: [facility];  Service: Urology;  Laterality: Left;  ureteral catheter  placement  . Cardiac catheterization  08/25/2013  . Tracheostomy  01/2013    emergent d/t difficult intubation  . Colon surgery    . Tracheostomy closure    . Left heart catheterization with coronary angiogram N/A 08/25/2013    Procedure: LEFT HEART CATHETERIZATION WITH CORONARY ANGIOGRAM;  Surgeon: Wellington Hampshire, MD;  Location: [facility];  Service: Cardiovascular;  Laterality: N/A;   HPI:  Zachary Walls is a 59 y.o. male with COPD, hypertension, hyperlipidemia, GERD, tobacco abuse, alcohol abuse, coronary artery disease, peripheral vascular disease, history of cardiac arrest, bowel resection with colostomy who presented with respiratory failure due to L HCAP superimposed on COPD.    Assessment / Plan / Recommendation Clinical Impression  Pt orally accepts ice chips and demonstrates some bolus manipulation; however, no pharyngeal swallow can be elicited reflexively or with Max cues and stimulation from therapist. Wet vocal quality noted, followed by delayed, wet cough. Pt is not able to take POs at this time. Question source of this dysphagia, as poor trigger reflex is not indicative of an acute dysphagia s/p prolonged intubation, but appears more neurologically based. Pt also with an apparent aphasia that appears to impact his expressive > receptive communication, with minimal verbal output upon prompting. MD made aware of neurological findings. Will continue to follow to assess  for PO readiness. MD may wish to order SLP speech/language evaluation as well.    Aspiration Risk  Severe aspiration risk    Diet Recommendation  NPO   Medication Administration: Via alternative means    Other  Recommendations Oral Care Recommendations: Oral care QID   Follow up Recommendations   (tba)    Frequency and Duration min 2x/week  2 weeks       Prognosis Prognosis for Safe Diet Advancement: Good Barriers to Reach Goals: Severity of deficits      Swallow Study   General HPI: Zachary Walls is a  59 y.o. male with COPD, hypertension, hyperlipidemia, GERD, tobacco abuse, alcohol abuse, coronary artery disease, peripheral vascular disease, history of cardiac arrest, bowel resection with colostomy who presented with respiratory failure due to L HCAP superimposed on COPD.  Type of Study: Bedside Swallow Evaluation Previous Swallow Assessment: swallowing tests completed during 2014 admission included initial FEES recommeinding NPO and repeat MBS recommending Dys 3 and thin liquids; BSE in 2015 recommended regular diet and thin liquids Diet Prior to this Study: NPO Temperature Spikes Noted: No Respiratory Status: Nasal cannula History of Recent Intubation: Yes Length of Intubations (days): 6 days Date extubated: 10/13/15 Behavior/Cognition: Alert;Cooperative;Requires cueing Oral Cavity Assessment: Within Functional Limits Patient Positioning: Upright in bed Baseline Vocal Quality: Normal Volitional Cough: Cognitively unable to elicit Volitional Swallow: Unable to elicit    Oral/Motor/Sensory Function     Ice Chips Ice chips: Impaired Presentation: Spoon Pharyngeal Phase Impairments: Unable to trigger swallow;Wet Vocal Quality;Cough - Delayed   Thin Liquid Thin Liquid: Not tested    Nectar Thick Nectar Thick Liquid: Not tested   Honey Thick Honey Thick Liquid: Not tested   Puree Puree: Not tested   Solid Solid: Not tested      Germain Osgood, M.A. CCC-SLP [phone]  Germain Osgood 10/14/2015,8:26 AM

## 2015-10-14 NOTE — Progress Notes (Signed)
UR COMPLETED  

## 2015-10-14 NOTE — Progress Notes (Signed)
Patients oxygen saturation in the 70's increased the 02 from 2L to 6L with no relief.  Applied a non re-breather patient came up to 100%.  Pt. Is rhonchus with excessive secretions that he is unable to manage at this time.  Pt. Requiring repeated suctioning at bedside.  Contacted Dr. Nelda Marseille updated on patients condition received an order to switch patient to a venti mask and administer Lasix.  Contacted respiratory and updated on patients condition.  Will continue to monitor.

## 2015-10-14 NOTE — Progress Notes (Signed)
PULMONARY / CRITICAL CARE MEDICINE   Name: Zachary Walls MRN: [id number] DOB: 1955-12-14    ADMISSION DATE:  10/08/2015 CONSULTATION DATE:  10/09/15  REFERRING MD:  Dr. Elnora Morrison, MD  CHIEF COMPLAINT:  Respiratory failure  HISTORY OF PRESENT ILLNESS:   Zachary Walls is a 59 y.o. male with COPD, hypertension, hyperlipidemia, GERD, tobacco abuse, alcohol abuse, coronary artery disease, peripheral vascular disease, history of cardiac arrest, bowel resection with colostomy who presented with respiratory failure due to L HCAP superimposed on COPD.   SUBJECTIVE / INTERVAL EVENTS:  Extubated 12/22 but failed swallow evaluation this AM.  VITAL SIGNS: BP 158/93 mmHg  Pulse 78  Temp(Src) 98.7 F (37.1 C) (Oral)  Resp 12  Ht 5\' 7"  (1.702 m)  Wt 54.1 kg (119 lb 4.3 oz)  BMI 18.68 kg/m2  SpO2 96%  VENTILATOR SETTINGS: Vent Mode:  [-] PRVC FiO2 (%):  [40 %] 40 % Set Rate:  [18 bmp] 18 bmp Vt Set:  [550 mL] 550 mL PEEP:  [5 cmH20] 5 cmH20 Plateau Pressure:  [17 cmH20] 17 cmH20  PHYSICAL EXAMINATION: General:  Extubated, very hard of hearing but following commands. Neuro: Awake and interactive, moving all ext to command.  Difficulty with speech but per family that is baseline. HEENT:  Dry mucous membranes, clear oropharynx Cardiovascular:  Regular rhythm, no murmurs appreciated Lungs:  Diminished breath sounds; left>right, mild wheezing  left lung  Abdomen:  Firm, NT, ND, hypoactive bowel sounds, ostomy in place Musculoskeletal:  No deformities noted Skin:  No rashes or lesions  LABS:  BMET  Recent Labs Lab 10/12/15 0335 10/13/15 0724 10/14/15 0300  NA 138 138 141  K 3.9 3.7 4.3  CL 99* 100* 99*  CO2 31 30 34*  BUN 21* 21* 19  CREATININE 0.76 0.67 0.69  GLUCOSE 162* 134* 116*   Electrolytes  Recent Labs Lab 10/11/15 0350 10/12/15 0335 10/13/15 0724 10/14/15 0300  CALCIUM 8.3* 8.6* 8.3* 8.8*  MG  --  1.8 2.2 2.1  PHOS 2.9  --  2.9 2.4*   CBC  Recent  Labs Lab 10/12/15 0335 10/13/15 0724 10/14/15 0300  WBC 16.6* 13.4* 16.0*  HGB 10.9* 10.4* 10.6*  HCT 33.3* 31.6* 34.0*  PLT 269 314 377   Coag's No results for input(s): APTT, INR in the last 168 hours.  Sepsis Markers  Recent Labs Lab 10/08/15 2215 10/09/15 0214  LATICACIDVEN 8.00* 1.5   ABG  Recent Labs Lab 10/09/15 0431 10/10/15 0436 10/13/15 0246  PHART 7.448 7.423 7.490*  PCO2ART 68.6* 58.2* 45.6*  PO2ART 142.0* 46.1* 75.0*   Liver Enzymes  Recent Labs Lab 10/08/15 2208  AST 30  ALT 19  ALKPHOS 67  BILITOT 1.0  ALBUMIN 2.9*   Cardiac Enzymes  Recent Labs Lab 10/08/15 2208  TROPONINI 0.04*   Glucose  Recent Labs Lab 10/13/15 0325 10/13/15 0740 10/13/15 1930 10/13/15 2336 10/14/15 0318 10/14/15 0743  GLUCAP 151* 120* 101* 124* 134* 138*   Imaging No results found. DISCUSSION: Zachary Walls is a 59 y.o. male with multiple chronic medical illnesses including substance abuse, bowel resection s/p colostomy and COPD on home oxygen admitted with L HCAP and VDRF, encephalopathy. Failed SBT attempts.   ASSESSMENT / PLAN:  PULMONARY A: Acute hypoxic respiratory failure  Chronic COPD with hypercapnia Partial collapse of the left lung from suspected mucous plug, improved HCAP>>worsening bilateral infiltrates on CXR and leukocytosis P:   - Extubated but unable to pass swallow evaluation, daughter is DPOA,  had an extensive conversation with her, patient is now DNR. - Empiric antibiotics as below. - Suction as needed. - Follow CXR  - Duoneb scheduled - Change solumedrol to prednisone 30 mg PO daily with a taper.  CARDIOVASCULAR A:  Septic shock from pneumonia-Resolved Recurrent hypotension-Resolved; currently hypertensive with SBPs 150-165 Mild bradycardia due to medication sideeffects P:  - Monitor HR and d/c precedex, causes bradycardia. - Continue lopressor at current dose.  RENAL A:   Acute kidney injury - Resolved P:   - Follow  BMP and UOP. - Replace electrolytes as indicated. - KVO IVF.  GASTROINTESTINAL A:   Hx colostomy, poor protein intake with chronic malnutrition P:   - D/C TF. - Swallow evaluation failed, place NGT (ok with daughter). - Aspiration precautions  HEMATOLOGIC A:   Blood-tinged resp secretions-Resolved  P:  - DVT prophylaxis with  heparin q8h  INFECTIOUS A:   L Healthcare associated pneumonia  Worsening leukocytosis on empiric antibiotics; afebrile; might be steroid related P:   Resp 12/18 >> few candida Blood 12/18 >>NTD - Continue Vanc and Zosyn for a total of 8 days. - Follow up blood, urine and sputum cultures - F/u WBC  NEUROLOGIC A:   Acute encephalopath-ETOH withdrawal versus septic  Severe agitation with stimulation on 12/19 with associated desat, then hypotension with sedation-Improved P:   - Discontinue  Fentanyl and precedex gtt's - Folate and thiamine. - D/C versed.  FAMILY  - Updates: Spoke with daughter and daughter in law over the phone, patient is now a full DNR, will transfer to SDU and to [facility] service with PCCM off 12/24.  - Inter-disciplinary family meet or Palliative Care meeting due by:  10/16/15  The patient is critically ill with multiple organ systems failure and requires high complexity decision making for assessment and support, frequent evaluation and titration of therapies, application of advanced monitoring technologies and extensive interpretation of multiple databases.   Critical Care Time devoted to patient care services described in this note is  35  Minutes. This time reflects time of care of this signee Dr Jennet Maduro. This critical care time does not reflect procedure time, or teaching time or supervisory time of PA/NP/Med student/Med Resident etc but could involve care discussion time.  Rush Farmer, M.D. [facility] Pulmonary/Critical Care Medicine. Pager: [phone]. After hours pager: [phone].

## 2015-10-14 NOTE — Progress Notes (Addendum)
Unable to start patient tube feeding at this time due to respiratory instability, physician updated.  Will continue to monitor.

## 2015-10-14 NOTE — Progress Notes (Signed)
RT assessment revealed coarse BS with moderate accessory muscle use.  Pt exhibited ineffective cough.  NT suction preformed with moderate amount of thin clear secretions removed.  Patient tolerated procedure fairly well.  Post BS improved.  Mild SpO2 desat during procedure, however, saturations increased back to baseline after reapplying venti mask.  RT will continue to follow.

## 2015-10-14 NOTE — Progress Notes (Signed)
Nutrition Follow-up  DOCUMENTATION CODES:   Severe malnutrition in context of chronic illness, Underweight  INTERVENTION:   Initiate Jevity 1.2 @ 65 ml/hr via Cortrak feeding tube   Tube feeding regimen provides 1872 kcal (100% of needs), 86 grams of protein, and 1263 ml of H2O.   NUTRITION DIAGNOSIS:   Inadequate oral intake related to inability to eat as evidenced by NPO status. Ongoing.   GOAL:   Patient will meet greater than or equal to 90% of their needs Not met with change in status.   MONITOR:   Diet advancement, Labs, Weight trends, Skin, I & O's  ASSESSMENT:   59 y.o. male with COPD, hypertension, hyperlipidemia, GERD, tobacco abuse, alcohol abuse, coronary artery disease, peripheral vascular disease, history of cardiac arrest, bowel resection with colostomy who presented with respiratory failure due to L HCAP superimposed on COPD.   Pt extubated 12/22. Failed swallow. Lots of secretions. Cortrak tube placed 12/23, tip in stomach.  Medications reviewed and include: prednisone, senokot Labs reviewed: phosphorus low 2.4 CBG's: 101-138   Diet Order:    NPO  Skin:  Wound (see comment) (wound to coccyx and buttocks)  Last BM:  600 ml 12/22 via colostomy  Height:   Ht Readings from Last 1 Encounters:  10/09/15 _0  (1.702 m)   Weight:   Wt Readings from Last 1 Encounters:  10/14/15 119 lb 4.3 oz (54.1 kg)   Ideal Body Weight:  67.3 kg  BMI:  Body mass index is 18.68 kg/(m^2).  Estimated Nutritional Needs:   Kcal:  1600-1800  Protein:  75-90 grams  Fluid:  >1.7 L/day  EDUCATION NEEDS:   No education needs identified at this time  [place], [place], [place] Pager [phone] After Hours Pager

## 2015-10-15 DIAGNOSIS — R1310 Dysphagia, unspecified: Secondary | ICD-10-CM

## 2015-10-15 DIAGNOSIS — Z9289 Personal history of other medical treatment: Secondary | ICD-10-CM

## 2015-10-15 DIAGNOSIS — R6520 Severe sepsis without septic shock: Secondary | ICD-10-CM | POA: Diagnosis present

## 2015-10-15 DIAGNOSIS — E43 Unspecified severe protein-calorie malnutrition: Secondary | ICD-10-CM

## 2015-10-15 DIAGNOSIS — J9621 Acute and chronic respiratory failure with hypoxia: Secondary | ICD-10-CM

## 2015-10-15 DIAGNOSIS — Z789 Other specified health status: Secondary | ICD-10-CM

## 2015-10-15 DIAGNOSIS — Z4659 Encounter for fitting and adjustment of other gastrointestinal appliance and device: Secondary | ICD-10-CM

## 2015-10-15 DIAGNOSIS — J189 Pneumonia, unspecified organism: Secondary | ICD-10-CM | POA: Diagnosis present

## 2015-10-15 DIAGNOSIS — A419 Sepsis, unspecified organism: Principal | ICD-10-CM

## 2015-10-15 LAB — GLUCOSE, CAPILLARY
GLUCOSE-CAPILLARY: 101 mg/dL — AB (ref 65–99)
GLUCOSE-CAPILLARY: 93 mg/dL (ref 65–99)
GLUCOSE-CAPILLARY: 110 mg/dL — AB (ref 65–99)
GLUCOSE-CAPILLARY: 115 mg/dL — AB (ref 65–99)
Glucose-Capillary: 108 mg/dL — ABNORMAL HIGH (ref 65–99)
Glucose-Capillary: 107 mg/dL — ABNORMAL HIGH (ref 65–99)

## 2015-10-15 LAB — BASIC METABOLIC PANEL
ANION GAP: 8 (ref 5–15)
BUN: 16 mg/dL (ref 6–20)
CO2: 42 mmol/L — AB (ref 22–32)
Calcium: 8.7 mg/dL — ABNORMAL LOW (ref 8.9–10.3)
Chloride: 95 mmol/L — ABNORMAL LOW (ref 101–111)
Creatinine, Ser: 0.88 mg/dL (ref 0.61–1.24)
GFR calc Af Amer: >60 mL/min (ref >60)
GLUCOSE: 98 mg/dL (ref 65–99)
POTASSIUM: 3.2 mmol/L — AB (ref 3.5–5.1)
Sodium: 145 mmol/L (ref 135–145)

## 2015-10-15 LAB — CBC
HEMATOCRIT: 39.3 % (ref 39.0–52.0)
HEMOGLOBIN: 12 g/dL — AB (ref 13.0–17.0)
MCH: 28.8 pg (ref 26.0–34.0)
MCHC: 30.5 g/dL (ref 30.0–36.0)
MCV: 94.2 fL (ref 78.0–100.0)
Platelets: 491 10*3/uL — ABNORMAL HIGH (ref 150–400)
RBC: 4.17 MIL/uL — AB (ref 4.22–5.81)
RDW: 14.8 % (ref 11.5–15.5)
WBC: 18.1 10*3/uL — AB (ref 4.0–10.5)

## 2015-10-15 LAB — PHOSPHORUS: Phosphorus: 4 mg/dL (ref 2.5–4.6)

## 2015-10-15 LAB — MAGNESIUM: Magnesium: 2 mg/dL (ref 1.7–2.4)

## 2015-10-15 MED ORDER — DIAZEPAM 5 MG PO TABS
5.0000 mg | ORAL_TABLET | Freq: Three times a day (TID) | ORAL | Status: DC
  Administered 2015-10-15 – 2015-10-16 (×2): 5 mg
  Filled 2015-10-15 (×2): qty 1

## 2015-10-15 MED ORDER — HYDROCODONE-ACETAMINOPHEN 7.5-325 MG/15ML PO SOLN: 10.0000 mL | Freq: Four times a day (QID) | ORAL | Status: DC | PRN

## 2015-10-15 MED ORDER — PREDNISONE 10 MG PO TABS
30.0000 mg | ORAL_TABLET | Freq: Every day | ORAL | Status: DC
  Administered 2015-10-15 – 2015-10-16 (×2): 30 mg via ORAL
  Filled 2015-10-15 (×2): qty 3

## 2015-10-15 MED ORDER — VITAMIN B-1 100 MG PO TABS
100.0000 mg | ORAL_TABLET | Freq: Every day | ORAL | Status: DC
  Administered 2015-10-16: 100 mg
  Filled 2015-10-15: qty 1

## 2015-10-15 MED ORDER — SIMETHICONE 80 MG PO CHEW: 80.0000 mg | CHEWABLE_TABLET | Freq: Four times a day (QID) | ORAL | Status: DC | PRN

## 2015-10-15 NOTE — Progress Notes (Signed)
[facility] Progress Note    Patient: Zachary Walls     FYB:[id number]  DOB: 1956/02/03     DOA: 10/08/2015 Date of Service: the patient was seen and examined on 10/15/2015  Subjective: Patient complains of abdominal gas. Denies any nausea or vomiting. Overnight required nasotracheal suction. Nutrition: Currently receiving tube feeding Activity: Mostly bedridden Last BM: 10/14/2015 via colostomy  Assessment and Plan: 1. Severe sepsis ([place])  Healthcare associated pneumonia Partial collapse of the left lung from mucous plug Acute on chronic hypoxic respiratory failure with chronic COPD and hypercapnia  presented with episode of unresponsiveness requiring intubation in the field, patient was never pulseless. Lactic acid was 8, received O2 by exchange in the ER on arrival. X-ray showed left upper lung partial collapse probably mucous blood. Met criteria for sepsis with leukocytosis, lactic acidosis, hypotension and hypothermia. Required IV Lasix agitation followed by phenylephrine drip, fentanyl drip as well as Precedex Extubated on 10/13/2015 Blood culture negative for 5 days Tracheal aspirate shows Candida albicans but no bacterial infection. We will continue on broad-spectrum antibiotics at present, and narrow down tomorrow if continues to improve. Continue prednisone 30 mg by mouth, duo nebs  2. Dysphagia Severe protein calorie malnutrition NG tube insertion On tube feeding Patient has prior severe protein calorie malnutrition. Has failed a swallowing evaluation after extubation. Currently has an NG tube and is on tube feeding. We will follow recommendation of the dietary. Monitor output from colostomy.  3. Acute encephalopathy Severe agitation  Drinks 1-340 ounce beers most of the days. Probable alcohol withdrawal with delirium tremens. At present no agitation seen this morning. Will continue monitoring. Continue thiamine and folic acid. Use when necessary lorazepam  for agitation. Continue Seroquel and risperidone.  4. Leukocytosis Most likely due to prednisone. Will continue monitoring.  5. Mood disorder. Patient is on Lexapro, Seroquel, risperidone at home. We will continue.   DVT Prophylaxis: subcutaneous Heparin Nutrition: On tube feeding Advance goals of care discussion: DNR/DNI as per discussion by the earlier team critical care  Brief Summary of Hospitalization:  HPI: As per the H and P dictated on admission, "Zachary Walls is a 59 y.o. male with COPD, hypertension, hyperlipidemia, GERD, tobacco abuse, alcohol abuse, coronary artery disease, peripheral vascular disease, history of cardiac arrest, bowel resection with colostomy who presented with respiratory failure.  Per reports from the ED staff, he was found unresponsive in his SNF with a GCS of 3 but maintaining a pulse. He was intubated in the field and brought to the [facility] ED. He was found to have hypoxemia (SpO2 50%s) on the ventilator and collapse of the left upper lobe on CXR. The ED performed a replacement of his ETT with a 7.7F tube. He remained very hypoxic with PaO2 in the 50s despite FiO2 100%, PEEP 5. His lactate was 8, BP low and hypothermic on arrival which improved after fluid resuscitation, antibiotics and active external warming. MICU was called then for admission. His mental status also recovered to him making eye contact and flailing all extremities for which he was given boluses of fentanyl and versed.  No further history was obtainable as the patient is intubated and sedated. His family was attempted to be reached on multiple phone numbers which went to disconnected lines or un-identified voice mailboxes."  Daily update, Procedures: Intubation on admission 10/09/2015 Extubation 10/13/2015 Transferred to step down 10/14/2015 Consultants: Primary team critical care service Antibiotics: Anti-infectives    Start     Dose/Rate Route Frequency Ordered  Stop   10/12/15 1330   vancomycin (VANCOCIN) IVPB 1000 mg/200 mL premix     1,000 mg 200 mL/hr over 60 Minutes Intravenous Every 12 hours 10/12/15 1321 10/16/15 2359   10/10/15 1200  vancomycin (VANCOCIN) IVPB 750 mg/150 ml premix  Status:  Discontinued     750 mg 150 mL/hr over 60 Minutes Intravenous Every 12 hours 10/10/15 0852 10/12/15 1321   10/10/15 0000  vancomycin (VANCOCIN) IVPB 750 mg/150 ml premix  Status:  Discontinued     750 mg 150 mL/hr over 60 Minutes Intravenous Every 24 hours 10/09/15 0248 10/09/15 1004   10/10/15 0000  vancomycin (VANCOCIN) IVPB 1000 mg/200 mL premix  Status:  Discontinued     1,000 mg 200 mL/hr over 60 Minutes Intravenous Every 24 hours 10/09/15 1004 10/10/15 0851   10/09/15 0800  piperacillin-tazobactam (ZOSYN) IVPB 3.375 g     3.375 g 12.5 mL/hr over 240 Minutes Intravenous 3 times per day 10/09/15 0248 10/16/15 2359   10/09/15 0030  vancomycin (VANCOCIN) IVPB 1000 mg/200 mL premix     1,000 mg 200 mL/hr over 60 Minutes Intravenous  Once 10/09/15 0016 10/09/15 0216   10/09/15 0030  piperacillin-tazobactam (ZOSYN) IVPB 2.25 g     2.25 g 100 mL/hr over 30 Minutes Intravenous  Once 10/09/15 0016 10/10/15 0981       Family Communication: no family was present at bedside, at the time of interview.   Disposition:  Expected discharge date: 10/17/2015 Barriers to safe discharge: Improvement in respiratory status   Intake/Output Summary (Last 24 hours) at 10/15/15 1636 Last data filed at 10/15/15 1500  Gross per 24 hour  Intake 682.17 ml  Output    935 ml  Net -252.83 ml   Filed Weights   10/13/15 0352 10/14/15 0500 10/15/15 0344  Weight: 53 kg (116 lb 13.5 oz) 54.1 kg (119 lb 4.3 oz) 53.5 kg (117 lb 15.1 oz)    Objective: Physical Exam: Filed Vitals:   10/15/15 1147 10/15/15 1435 10/15/15 1437 10/15/15 1605  BP:  131/69    Pulse:  73    Temp: 97.8 F (36.6 C)  98.2 F (36.8 C)   TempSrc: Axillary  Oral   Resp:  9    Height:      Weight:      SpO2:  96%   97%     General: Appear in moderate distress, no Rash; Oral Mucosa moist. Cardiovascular: S1 and S2 Present, no Murmur, difficult to assess JVD Respiratory: Bilateral Air entry present and bilateral Crackles, bilateral wheezes Abdomen: Bowel Sound present, Soft and no tenderness Extremities: no Pedal edema, no calf tenderness Neurology: Grossly no focal neuro deficit.  Data Reviewed: CBC:  Recent Labs Lab 10/08/15 2208  10/11/15 0350 10/12/15 0335 10/13/15 0724 10/14/15 0300 10/15/15 0649  WBC 12.7*  < > 13.3* 16.6* 13.4* 16.0* 18.1*  NEUTROABS 9.3*  --   --   --   --   --   --   HGB 11.2*  < > 10.2* 10.9* 10.4* 10.6* 12.0*  HCT 38.8*  < > 31.7* 33.3* 31.6* 34.0* 39.3  MCV 102.4*  < > 91.4 91.7 90.8 94.4 94.2  PLT 233  < > 248 269 314 377 491*  < > = values in this interval not displayed. Basic Metabolic Panel:  Recent Labs Lab 10/09/15 0700  10/11/15 0350 10/12/15 0335 10/13/15 0724 10/14/15 0300 10/15/15 0649  NA 141  < > 138 138 138 141 145  K 4.3  < >  3.3* 3.9 3.7 4.3 3.2*  CL 95*  < > 97* 99* 100* 99* 95*  CO2 40*  < > 35* 31 30 34* 42*  GLUCOSE 87  < > 172* 162* 134* 116* 98  BUN 21*  < > 15 21* 21* 19 16  CREATININE 0.70  < > 0.73 0.76 0.67 0.69 0.88  CALCIUM 8.5*  < > 8.3* 8.6* 8.3* 8.8* 8.7*  MG 1.7  --   --  1.8 2.2 2.1 2.0  PHOS <1.0*  --  2.9  --  2.9 2.4* 4.0  < > = values in this interval not displayed. Liver Function Tests:  Recent Labs Lab 10/08/15 2208  AST 30  ALT 19  ALKPHOS 67  BILITOT 1.0  PROT 7.0  ALBUMIN 2.9*   No results for input(s): LIPASE, AMYLASE in the last 168 hours. No results for input(s): AMMONIA in the last 168 hours.  Cardiac Enzymes:  Recent Labs Lab 10/08/15 2208  TROPONINI 0.04*    BNP (last 3 results)  Recent Labs  09/23/15 1842  BNP 143.0*    CBG:  Recent Labs Lab 10/14/15 2303 10/15/15 0342 10/15/15 0754 10/15/15 1128 10/15/15 1603  GLUCAP 101* 108* 93 110* 115*    Recent Results  (from the past 240 hour(s))  Blood culture (routine x 2)     Status: None   Collection Time: 10/08/15 10:20 PM  Result Value Ref Range Status   Specimen Description BLOOD LEFT WRIST  Final   Special Requests BOTTLES DRAWN AEROBIC ONLY 4CC  Final   Culture NO GROWTH 5 DAYS  Final   Report Status 10/14/2015 FINAL  Final  Blood culture (routine x 2)     Status: None   Collection Time: 10/08/15 10:54 PM  Result Value Ref Range Status   Specimen Description BLOOD RIGHT WRIST  Final   Special Requests BOTTLES DRAWN AEROBIC ONLY 5CC  Final   Culture NO GROWTH 5 DAYS  Final   Report Status 10/13/2015 FINAL  Final  MRSA PCR Screening     Status: None   Collection Time: 10/09/15  3:19 AM  Result Value Ref Range Status   MRSA by PCR NEGATIVE NEGATIVE Final    Comment:        The GeneXpert MRSA Assay (FDA approved for NASAL specimens only), is one component of a comprehensive MRSA colonization surveillance program. It is not intended to diagnose MRSA infection nor to guide or monitor treatment for MRSA infections.   Culture, respiratory (NON-Expectorated)     Status: None   Collection Time: 10/09/15 10:03 AM  Result Value Ref Range Status   Specimen Description TRACHEAL ASPIRATE  Final   Special Requests NONE  Final   Gram Stain   Final    FEW WBC PRESENT,BOTH PMN AND MONONUCLEAR RARE SQUAMOUS EPITHELIAL CELLS PRESENT NO ORGANISMS SEEN Performed at [facility]    Culture   Final    FEW CANDIDA ALBICANS Performed at [facility]    Report Status 10/11/2015 FINAL  Final     Studies: No results found.   Scheduled Meds: . antiseptic oral rinse  7 mL Mouth Rinse q12n4p  . atorvastatin  20 mg Oral QPM  . chlorhexidine  15 mL Mouth Rinse BID  . escitalopram  10 mg Oral Daily  . heparin subcutaneous  5,000 Units Subcutaneous 3 times per day  . ipratropium-albuterol  3 mL Nebulization Q6H  . metoprolol tartrate  12.5 mg Per Tube BID  . pantoprazole  sodium  40 mg  Per Tube Daily  . piperacillin-tazobactam (ZOSYN)  IV  3.375 g Intravenous 3 times per day  . predniSONE  30 mg Oral Q breakfast  . QUEtiapine  50 mg Oral q morning - 10a  . risperiDONE  0.5 mg Oral BID  . sennosides  5 mL Per Tube BID  . vancomycin  1,000 mg Intravenous Q12H   Continuous Infusions: . feeding supplement (JEVITY 1.2 CAL) 1,000 mL (10/15/15 1535)   PRN Meds: sodium chloride, acetaminophen (TYLENOL) oral liquid 160 mg/5 mL, albuterol, simethicone  Time spent: 30 minutes  Author: Berle Mull, MD [facility] Pager: [phone] 10/15/2015 4:36 PM  If 7PM-7AM, please contact night-coverage at [email or web address], password [facility]

## 2015-10-15 NOTE — Progress Notes (Signed)
PULMONARY / CRITICAL CARE MEDICINE   Name: Zachary Walls MRN: [id number] DOB: 1956/10/08    ADMISSION DATE:  10/08/2015 CONSULTATION DATE:  10/09/15  REFERRING MD:  Dr. Elnora Morrison, MD  CHIEF COMPLAINT:  Respiratory failure  HISTORY OF PRESENT ILLNESS:   Zachary Walls is a 59 y.o. male with COPD, hypertension, hyperlipidemia, GERD, tobacco abuse, alcohol abuse, coronary artery disease, peripheral vascular disease, history of cardiac arrest, bowel resection with colostomy who presented with respiratory failure due to L HCAP superimposed on COPD.   SUBJECTIVE / INTERVAL EVENTS:  Extubated 12/22 but failed swallow evaluation. No acute issue reported  VITAL SIGNS: BP 148/93 mmHg  Pulse 68  Temp(Src) 98.1 F (36.7 C) (Axillary)  Resp 26  Ht 5\' 7"  (1.702 m)  Wt 53.5 kg (117 lb 15.1 oz)  BMI 18.47 kg/m2  SpO2 98%  VENTILATOR SETTINGS: Vent Mode:  [-]  FiO2 (%):  [31 %-55 %] 55 %  PHYSICAL EXAMINATION: General:  Extubated, very hard of hearing  Neuro: Awake and interactive, moving all ext to command.  Difficulty with speech but per family that is baseline. Seems at least mildly confused. HEENT:  Dry mucous membranes, clear oropharynx. NG tube Cardiovascular:  Regular rhythm, no murmurs appreciated Lungs:  Diminished breath sounds;Bilateral mild rhonchi and upper airway rattle. Non-productive cough on command. Venti mask Abdomen:  Firm, NT, ND, hypoactive bowel sounds, ostomy in place.  Musculoskeletal:  No deformities noted Skin:  No rashes or lesions  LABS:  BMET  Recent Labs Lab 10/13/15 0724 10/14/15 0300 10/15/15 0649  NA 138 141 145  K 3.7 4.3 3.2*  CL 100* 99* 95*  CO2 30 34* 42*  BUN 21* 19 16  CREATININE 0.67 0.69 0.88  GLUCOSE 134* 116* 98   Electrolytes  Recent Labs Lab 10/13/15 0724 10/14/15 0300 10/15/15 0649  CALCIUM 8.3* 8.8* 8.7*  MG 2.2 2.1 2.0  PHOS 2.9 2.4* 4.0   CBC  Recent Labs Lab 10/13/15 0724 10/14/15 0300 10/15/15 0649  WBC  13.4* 16.0* 18.1*  HGB 10.4* 10.6* 12.0*  HCT 31.6* 34.0* 39.3  PLT 314 377 491*   Coag's No results for input(s): APTT, INR in the last 168 hours.  Sepsis Markers  Recent Labs Lab 10/08/15 2215 10/09/15 0214  LATICACIDVEN 8.00* 1.5   ABG  Recent Labs Lab 10/09/15 0431 10/10/15 0436 10/13/15 0246  PHART 7.448 7.423 7.490*  PCO2ART 68.6* 58.2* 45.6*  PO2ART 142.0* 46.1* 75.0*   Liver Enzymes  Recent Labs Lab 10/08/15 2208  AST 30  ALT 19  ALKPHOS 67  BILITOT 1.0  ALBUMIN 2.9*   Cardiac Enzymes  Recent Labs Lab 10/08/15 2208  TROPONINI 0.04*   Glucose  Recent Labs Lab 10/14/15 1126 10/14/15 1536 10/14/15 1922 10/14/15 2303 10/15/15 0342 10/15/15 0754  GLUCAP 110* 106* 85 101* 108* 93   Imaging Dg Abd Portable 1v  10/14/2015  CLINICAL DATA:  Feeding tube placement. EXAM: PORTABLE ABDOMEN - 1 VIEW COMPARISON:  CT of the abdomen pelvis dated 07/14/2015 FINDINGS: The bowel gas pattern is nonobstructive. No radio-opaque calculi or other significant radiographic abnormality are seen. Feeding catheter overlies the expected location of gastric body. IMPRESSION: Feeding catheter overlies expected location of gastric body. Nonobstructive bowel gas pattern. Electronically Signed   By: Fidela Salisbury M.D.   On: 10/14/2015 13:26   DISCUSSION: Zachary Walls is a 59 y.o. male with multiple chronic medical illnesses including substance abuse, bowel resection s/p colostomy and COPD on home oxygen admitted  with L HCAP and VDRF, encephalopathy. Failed SBT attempts.   ASSESSMENT / PLAN:  PULMONARY A: Acute hypoxic respiratory failure  Chronic COPD with hypercapnia Partial collapse of the left lung from suspected mucous plug, improved HCAP>>worsening bilateral infiltrates on CXR and leukocytosis P:   - Extubated but unable to pass swallow evaluation, daughter is DPOA, patient is now DNR. - Empiric antibiotics as below. - Suction as needed. - Follow CXR -  update ordered for 12/25 - Duoneb scheduled - Change solumedrol to prednisone 30 mg PO daily with a taper.  CARDIOVASCULAR A:  Septic shock from pneumonia-Resolved Recurrent hypotension-Resolved; currently hypertensive with SBPs 150-165 Mild bradycardia due to medication sideeffects P:  - Monitor HR and d/c precedex, causes bradycardia. - Continue lopressor at current dose.  RENAL A:   Acute kidney injury - Resolved P:   - Follow BMP and UOP. - Replace electrolytes as indicated. - KVO IVF.  GASTROINTESTINAL A:   Hx colostomy, poor protein intake with chronic malnutrition P:   - D/C TF. - Swallow evaluation failed, place NGT (ok with daughter). - Aspiration precautions  HEMATOLOGIC A:   Blood-tinged resp secretions-Resolved  P:  - DVT prophylaxis with  heparin q8h  INFECTIOUS A:   L Healthcare associated pneumonia  Worsening leukocytosis on empiric antibiotics; afebrile; might be steroid related P:   Resp 12/18 >> few candida Blood 12/18 >>NTD - Continue Vanc and Zosyn for a total of 8 days. - Follow up blood, urine and sputum cultures - F/u WBC  NEUROLOGIC A:   Acute encephalopath-ETOH withdrawal versus septic  Severe agitation with stimulation on 12/19 with associated desat, then hypotension with sedation-Improved P:   - Discontinue  Fentanyl and precedex gtt's - Folate and thiamine. - D/C versed.  FAMILY  - Updates: Spoke with daughter and daughter in law over the phone, patient is now a full DNR, will transfer to SDU and to [facility] service with PCCM off 12/24.  - Inter-disciplinary family meet or Palliative Care meeting due by:  10/16/15    CD Annamaria Boots, M.D. [facility] Pulmonary/Critical Care Medicine. [phone] After hours pager: [phone].

## 2015-10-15 NOTE — Progress Notes (Signed)
Patient diaphoretic with increased respiratory rate and audible coarse breath sounds.  NT suction returned a large amount of thin white/clear secretions.  Patient tolerated well.  No drop in SpO2 noted.  Returned to venti mask.  RT will continue to follow.

## 2015-10-15 NOTE — Progress Notes (Addendum)
Pharmacy Antibiotic Follow-up Note  Zachary Walls is a 59 y.o. year-old male admitted on 10/08/2015.  The patient is currently on day 7 of vanc/zosyn for HCAP.  Assessment/Plan: Continue current dose of abx. Monitor renal function, and clinical course. Stop date entered for 12/25.   Temp (24hrs), Avg:98.1 F (36.7 C), Min:97.8 F (36.6 C), Max:98.3 F (36.8 C)   Recent Labs Lab 10/11/15 0350 10/12/15 0335 10/13/15 0724 10/14/15 0300 10/15/15 0649  WBC 13.3* 16.6* 13.4* 16.0* 18.1*    Recent Labs Lab 10/11/15 0350 10/12/15 0335 10/13/15 0724 10/14/15 0300 10/15/15 0649  CREATININE 0.73 0.76 0.67 0.69 0.88   Estimated Creatinine Clearance: 68.4 mL/min (by C-G formula based on Cr of 0.88).    Allergies  Allergen Reactions  . Bee Venom Shortness Of Breath and Swelling  . Shellfish Allergy Anaphylaxis  . Codeine Itching and Rash    Antimicrobials this admission: Vanc 12/18>> Zosyn 12/18>>  Levels/dose changes this admission: 12/21 VT: 12 on 750 q12 inc 1g q12  Microbiology results: 12/17 BCx x4 >> NG 12/18 TA >> few candida 12/18 MRSA PCR negative  Thank you for allowing pharmacy to be a part of this patient's care.  Angela Burke, PharmD Pharmacy Resident Pager: [phone] 10/15/2015 8:57 AM

## 2015-10-15 NOTE — Progress Notes (Signed)
SLP Cancellation Note  Patient Details Name: Zachary Walls MRN: [id number] DOB: 06-May-1956   Cancelled treatment:       Reason Eval/Treat Not Completed: Fatigue/lethargy limiting ability to participate;Medical issues which prohibited therapy. Pt with difficulty managing secretions last night, currently lethargic. Pt has NG for nutrition. Not appropriate for POs yet given severity of impairment. Of note, pt has a history of severe dysphagia in 2014 following similar hospital admission. Will f/u as able.    Joselyn Edling, Katherene Ponto 10/15/2015, 9:43 AM

## 2015-10-16 ENCOUNTER — Inpatient Hospital Stay (HOSPITAL_COMMUNITY): Payer: Medicaid Other

## 2015-10-16 DIAGNOSIS — H9193 Unspecified hearing loss, bilateral: Secondary | ICD-10-CM

## 2015-10-16 DIAGNOSIS — Z4659 Encounter for fitting and adjustment of other gastrointestinal appliance and device: Secondary | ICD-10-CM

## 2015-10-16 DIAGNOSIS — Z515 Encounter for palliative care: Secondary | ICD-10-CM | POA: Insufficient documentation

## 2015-10-16 LAB — BASIC METABOLIC PANEL
Anion gap: 6 (ref 5–15)
BUN: 11 mg/dL (ref 6–20)
CALCIUM: 8.3 mg/dL — AB (ref 8.9–10.3)
CO2: 42 mmol/L — AB (ref 22–32)
CREATININE: 0.77 mg/dL (ref 0.61–1.24)
Chloride: 95 mmol/L — ABNORMAL LOW (ref 101–111)
GFR calc Af Amer: >60 mL/min (ref >60)
GFR calc non Af Amer: >60 mL/min (ref >60)
GLUCOSE: 120 mg/dL — AB (ref 65–99)
Potassium: 2.9 mmol/L — ABNORMAL LOW (ref 3.5–5.1)
Sodium: 143 mmol/L (ref 135–145)

## 2015-10-16 LAB — CBC WITH DIFFERENTIAL/PLATELET
BASOS ABS: 0 10*3/uL (ref 0.0–0.1)
BASOS PCT: 0 %
EOS PCT: 1 %
Eosinophils Absolute: 0.2 10*3/uL (ref 0.0–0.7)
HEMATOCRIT: 32.8 % — AB (ref 39.0–52.0)
Hemoglobin: 10.5 g/dL — ABNORMAL LOW (ref 13.0–17.0)
LYMPHS PCT: 13 %
Lymphs Abs: 1.8 10*3/uL (ref 0.7–4.0)
MCH: 29.5 pg (ref 26.0–34.0)
MCHC: 32 g/dL (ref 30.0–36.0)
MCV: 92.1 fL (ref 78.0–100.0)
MONO ABS: 1.5 10*3/uL — AB (ref 0.1–1.0)
Monocytes Relative: 11 %
Neutro Abs: 10.4 10*3/uL — ABNORMAL HIGH (ref 1.7–7.7)
Neutrophils Relative %: 75 %
Platelets: 441 10*3/uL — ABNORMAL HIGH (ref 150–400)
RBC: 3.56 MIL/uL — AB (ref 4.22–5.81)
RDW: 14.6 % (ref 11.5–15.5)
WBC: 13.9 10*3/uL — AB (ref 4.0–10.5)

## 2015-10-16 LAB — GLUCOSE, CAPILLARY
GLUCOSE-CAPILLARY: 141 mg/dL — AB (ref 65–99)
GLUCOSE-CAPILLARY: 80 mg/dL (ref 65–99)
Glucose-Capillary: 118 mg/dL — ABNORMAL HIGH (ref 65–99)
Glucose-Capillary: 132 mg/dL — ABNORMAL HIGH (ref 65–99)
Glucose-Capillary: 191 mg/dL — ABNORMAL HIGH (ref 65–99)
Glucose-Capillary: 78 mg/dL (ref 65–99)
Glucose-Capillary: 99 mg/dL (ref 65–99)

## 2015-10-16 MED ORDER — QUETIAPINE FUMARATE 50 MG PO TABS
50.0000 mg | ORAL_TABLET | Freq: Every morning | ORAL | Status: DC
  Administered 2015-10-16: 50 mg
  Filled 2015-10-16 (×2): qty 1

## 2015-10-16 MED ORDER — LORAZEPAM 2 MG/ML IJ SOLN
0.5000 mg | INTRAMUSCULAR | Status: DC | PRN
  Administered 2015-10-16: 0.5 mg via INTRAVENOUS
  Filled 2015-10-16: qty 1

## 2015-10-16 MED ORDER — RISPERIDONE 0.5 MG PO TABS
0.5000 mg | ORAL_TABLET | Freq: Two times a day (BID) | ORAL | Status: DC
  Administered 2015-10-16: 0.5 mg
  Filled 2015-10-16: qty 1

## 2015-10-16 MED ORDER — DIAZEPAM 5 MG PO TABS: 5.0000 mg | ORAL_TABLET | Freq: Three times a day (TID) | ORAL | Status: DC | PRN

## 2015-10-16 MED ORDER — LEVOFLOXACIN IN D5W 750 MG/150ML IV SOLN
750.0000 mg | INTRAVENOUS | Status: DC
  Administered 2015-10-16 – 2015-10-17 (×2): 750 mg via INTRAVENOUS
  Filled 2015-10-16 (×2): qty 150

## 2015-10-16 MED ORDER — ATORVASTATIN CALCIUM 20 MG PO TABS: 20.0000 mg | ORAL_TABLET | Freq: Every evening | ORAL | Status: DC

## 2015-10-16 MED ORDER — POTASSIUM CHLORIDE CRYS ER 20 MEQ PO TBCR: 40.0000 meq | EXTENDED_RELEASE_TABLET | Freq: Once | ORAL | Status: DC

## 2015-10-16 MED ORDER — ESCITALOPRAM OXALATE 10 MG PO TABS
10.0000 mg | ORAL_TABLET | Freq: Every day | ORAL | Status: DC
  Administered 2015-10-16: 10 mg
  Filled 2015-10-16: qty 1

## 2015-10-16 MED ORDER — FENTANYL CITRATE (PF) 100 MCG/2ML IJ SOLN: 12.5000 ug | INTRAMUSCULAR | Status: DC | PRN

## 2015-10-16 MED ORDER — HALOPERIDOL LACTATE 5 MG/ML IJ SOLN
2.0000 mg | Freq: Four times a day (QID) | INTRAMUSCULAR | Status: DC | PRN
  Administered 2015-10-16: 2 mg via INTRAVENOUS
  Filled 2015-10-16: qty 1

## 2015-10-16 MED ORDER — PREDNISONE 10 MG PO TABS: 30.0000 mg | ORAL_TABLET | Freq: Every day | ORAL | Status: DC

## 2015-10-16 MED ORDER — LEVOFLOXACIN 750 MG PO TABS
750.0000 mg | ORAL_TABLET | Freq: Every day | ORAL | Status: DC
  Filled 2015-10-16: qty 1

## 2015-10-16 MED ORDER — POTASSIUM CHLORIDE 20 MEQ PO PACK: 40.0000 meq | PACK | Freq: Once | ORAL | Status: DC

## 2015-10-16 MED ORDER — POTASSIUM CHLORIDE 20 MEQ PO PACK
40.0000 meq | PACK | Freq: Once | ORAL | Status: AC
  Administered 2015-10-16: 40 meq
  Filled 2015-10-16: qty 2

## 2015-10-16 NOTE — Progress Notes (Signed)
RN continues to find patient's hearing aides in his bed. Pt constantly requests his hearing aides only to take them out an hour later and put them in the bed. Currently, One is charging at bedside, while the other is in a specimen cup at the bedside on top of computer. Will pass on to next shift.

## 2015-10-16 NOTE — Progress Notes (Signed)
Pt pulled off venti mask. Awoke in a panic bc he was having difficulty breathing. O2 in the 60's. Pt trying to get out of bed. NG tube pulled out. Made MD aware. Will continue to monitor pt.

## 2015-10-16 NOTE — Progress Notes (Signed)
[facility] Progress Note    Patient: Zachary Walls    EFE:[id number]  DOB: Aug 18, 1956     DOA: 10/08/2015 Date of Service: the patient was seen and examined on 10/16/2015  Subjective: Patient mentioned he had some epigastric pain but denies having any chest pain or diarrhea. No nausea no vomiting. Tolerating tube feeding. Nutrition: Currently receiving tube feeding Activity: Mostly bedridden Last BM: 10/14/2015 via colostomy  Assessment and Plan: 1. Severe sepsis ([place])  Healthcare associated pneumonia Partial collapse of the left lung from mucous plug Acute on chronic hypoxic respiratory failure with chronic COPD and hypercapnia  presented with episode of unresponsiveness requiring intubation in the field, patient was never pulseless. Lactic acid was 8, received O2 by exchange in the ER on arrival. X-ray showed left upper lung partial collapse probably mucous blood. Met criteria for sepsis with leukocytosis, lactic acidosis, hypotension and hypothermia. Required IV resuscitation followed by phenylephrine drip, fentanyl drip as well as Precedex Extubated on 10/13/2015 Blood culture negative for 5 days Tracheal aspirate shows Candida albicans but no bacterial infection. We will continue on broad-spectrum antibiotics at present, and narrow down the Levaquin today. Continue prednisone 30 mg by mouth, duo nebs. Recommending RN to wean oxygen to 4-6 L.  2. Dysphagia Severe protein calorie malnutrition NG tube insertion On tube feeding Patient has prior severe protein calorie malnutrition. Has failed a swallowing evaluation after extubation. Currently has an NG tube and is on tube feeding. We will follow recommendation of the dietary. Ostomy output 850 Monitor output from colostomy.  3. Acute encephalopathy Severe agitation  Drinks 1-3 40 ounce beers most of the days. Probable alcohol withdrawal with delirium tremens. At present no agitation seen this morning. Will continue  monitoring. Continue thiamine and folic acid. Use when necessary lorazepam for agitation. Continue Seroquel and risperidone.  4. Leukocytosis Improving, Most likely due to prednisone. Will continue monitoring.  5. Mood disorder. Patient is on Lexapro, Seroquel, risperidone at home. We will continue.  6. Hypokalemia. Replacing with potassium  7. Goals of care discussion Patient was intubated on arrival. Earlier discussion with the power of attorney patient's daughter, by critical care, patient is a DO NOT RESUSCITATE. At present speech therapy recommends nothing by mouth, will request palliative care to help in discussion with the family about designating goals of care.  DVT Prophylaxis: subcutaneous Heparin Nutrition: On tube feeding Advance goals of care discussion: DNR/DNI as per discussion by the earlier team critical care  Brief Summary of Hospitalization:  HPI: As per the H and P dictated on admission, "Zachary Walls is a 59 y.o. male with COPD, hypertension, hyperlipidemia, GERD, tobacco abuse, alcohol abuse, coronary artery disease, peripheral vascular disease, history of cardiac arrest, bowel resection with colostomy who presented with respiratory failure.  Per reports from the ED staff, he was found unresponsive in his SNF with a GCS of 3 but maintaining a pulse. He was intubated in the field and brought to the [facility] ED. He was found to have hypoxemia (SpO2 50%s) on the ventilator and collapse of the left upper lobe on CXR. The ED performed a replacement of his ETT with a 7.71F tube. He remained very hypoxic with PaO2 in the 50s despite FiO2 100%, PEEP 5. His lactate was 8, BP low and hypothermic on arrival which improved after fluid resuscitation, antibiotics and active external warming. MICU was called then for admission. His mental status also recovered to him making eye contact and flailing all extremities for which he  was given boluses of fentanyl and versed.  No further  history was obtainable as the patient is intubated and sedated. His family was attempted to be reached on multiple phone numbers which went to disconnected lines or un-identified voice mailboxes."  Daily update, Procedures: Intubation on admission 10/09/2015 Extubation 10/13/2015 Transferred to step down 10/14/2015 Consultants: Primary team critical care service Antibiotics: Anti-infectives    Start     Dose/Rate Route Frequency Ordered Stop   10/12/15 1330  vancomycin (VANCOCIN) IVPB 1000 mg/200 mL premix     1,000 mg 200 mL/hr over 60 Minutes Intravenous Every 12 hours 10/12/15 1321 10/16/15 2359   10/10/15 1200  vancomycin (VANCOCIN) IVPB 750 mg/150 ml premix  Status:  Discontinued     750 mg 150 mL/hr over 60 Minutes Intravenous Every 12 hours 10/10/15 0852 10/12/15 1321   10/10/15 0000  vancomycin (VANCOCIN) IVPB 750 mg/150 ml premix  Status:  Discontinued     750 mg 150 mL/hr over 60 Minutes Intravenous Every 24 hours 10/09/15 0248 10/09/15 1004   10/10/15 0000  vancomycin (VANCOCIN) IVPB 1000 mg/200 mL premix  Status:  Discontinued     1,000 mg 200 mL/hr over 60 Minutes Intravenous Every 24 hours 10/09/15 1004 10/10/15 0851   10/09/15 0800  piperacillin-tazobactam (ZOSYN) IVPB 3.375 g     3.375 g 12.5 mL/hr over 240 Minutes Intravenous 3 times per day 10/09/15 0248 10/16/15 2359   10/09/15 0030  vancomycin (VANCOCIN) IVPB 1000 mg/200 mL premix     1,000 mg 200 mL/hr over 60 Minutes Intravenous  Once 10/09/15 0016 10/09/15 0216   10/09/15 0030  piperacillin-tazobactam (ZOSYN) IVPB 2.25 g     2.25 g 100 mL/hr over 30 Minutes Intravenous  Once 10/09/15 0016 10/10/15 4315       Family Communication: no family was present at bedside, at the time of interview.   Disposition: Remains in stepdown unit at present until oxygenation is 4-6 L nasal cannula. Expected discharge date: 10/19/2015 Barriers to safe discharge: Improvement in respiratory status   Intake/Output Summary  (Last 24 hours) at 10/16/15 1300 Last data filed at 10/16/15 1243  Gross per 24 hour  Intake 2362.5 ml  Output   1950 ml  Net  412.5 ml   Filed Weights   10/14/15 0500 10/15/15 0344 10/16/15 0356  Weight: 54.1 kg (119 lb 4.3 oz) 53.5 kg (117 lb 15.1 oz) 53.3 kg (117 lb 8.1 oz)    Objective: Physical Exam: Filed Vitals:   10/16/15 0400 10/16/15 0700 10/16/15 0854 10/16/15 1242  BP:      Pulse: 71     Temp:  98.8 F (37.1 C)  98.5 F (36.9 C)  TempSrc:  Axillary  Axillary  Resp: 16     Height:      Weight:      SpO2: 95%  95%     General: Appear in moderate distress, no Rash; Oral Mucosa moist. Cardiovascular: S1 and S2 Present, no Murmur, difficult to assess JVD Respiratory: Bilateral Air entry present and bilateral Crackles, no wheezes Abdomen: Bowel Sound present, Soft and no tenderness Extremities: no Pedal edema, no calf tenderness  Data Reviewed: CBC:  Recent Labs Lab 10/12/15 0335 10/13/15 0724 10/14/15 0300 10/15/15 0649 10/16/15 0530  WBC 16.6* 13.4* 16.0* 18.1* 13.9*  NEUTROABS  --   --   --   --  10.4*  HGB 10.9* 10.4* 10.6* 12.0* 10.5*  HCT 33.3* 31.6* 34.0* 39.3 32.8*  MCV 91.7 90.8 94.4 94.2 92.1  PLT 269 314 377 491* 485*   Basic Metabolic Panel:  Recent Labs Lab 10/11/15 0350 10/12/15 0335 10/13/15 0724 10/14/15 0300 10/15/15 0649 10/16/15 0530  NA 138 138 138 141 145 143  K 3.3* 3.9 3.7 4.3 3.2* 2.9*  CL 97* 99* 100* 99* 95* 95*  CO2 35* 31 30 34* 42* 42*  GLUCOSE 172* 162* 134* 116* 98 120*  BUN 15 21* 21* _0 CREATININE 0.73 0.76 0.67 0.69 0.88 0.77  CALCIUM 8.3* 8.6* 8.3* 8.8* 8.7* 8.3*  MG  --  1.8 2.2 2.1 2.0  --   PHOS 2.9  --  2.9 2.4* 4.0  --    Liver Function Tests: No results for input(s): AST, ALT, ALKPHOS, BILITOT, PROT, ALBUMIN in the last 168 hours. No results for input(s): LIPASE, AMYLASE in the last 168 hours. No results for input(s): AMMONIA in the last 168 hours.  Cardiac Enzymes: No results for  input(s): CKTOTAL, CKMB, CKMBINDEX, TROPONINI in the last 168 hours.  BNP (last 3 results)  Recent Labs  09/23/15 1842  BNP 143.0*    CBG:  Recent Labs Lab 10/15/15 1128 10/15/15 1603 10/15/15 2013 10/15/15 2332 10/16/15 0351  GLUCAP 110* 115* 107* 118* 132*    Recent Results (from the past 240 hour(s))  Blood culture (routine x 2)     Status: None   Collection Time: 10/08/15 10:20 PM  Result Value Ref Range Status   Specimen Description BLOOD LEFT WRIST  Final   Special Requests BOTTLES DRAWN AEROBIC ONLY 4CC  Final   Culture NO GROWTH 5 DAYS  Final   Report Status 10/14/2015 FINAL  Final  Blood culture (routine x 2)     Status: None   Collection Time: 10/08/15 10:54 PM  Result Value Ref Range Status   Specimen Description BLOOD RIGHT WRIST  Final   Special Requests BOTTLES DRAWN AEROBIC ONLY 5CC  Final   Culture NO GROWTH 5 DAYS  Final   Report Status 10/13/2015 FINAL  Final  MRSA PCR Screening     Status: None   Collection Time: 10/09/15  3:19 AM  Result Value Ref Range Status   MRSA by PCR NEGATIVE NEGATIVE Final    Comment:        The GeneXpert MRSA Assay (FDA approved for NASAL specimens only), is one component of a comprehensive MRSA colonization surveillance program. It is not intended to diagnose MRSA infection nor to guide or monitor treatment for MRSA infections.   Culture, respiratory (NON-Expectorated)     Status: None   Collection Time: 10/09/15 10:03 AM  Result Value Ref Range Status   Specimen Description TRACHEAL ASPIRATE  Final   Special Requests NONE  Final   Gram Stain   Final    FEW WBC PRESENT,BOTH PMN AND MONONUCLEAR RARE SQUAMOUS EPITHELIAL CELLS PRESENT NO ORGANISMS SEEN Performed at [facility]    Culture   Final    FEW CANDIDA ALBICANS Performed at [facility]    Report Status 10/11/2015 FINAL  Final     Studies: Dg Chest Port 1 View  10/16/2015  CLINICAL DATA:  Pt diagnosed with pna Hx COPD, pna,  HTN, tobacco abuse, CAD, SOB EXAM: PORTABLE CHEST - 1 VIEW COMPARISON:  10/13/2015 FINDINGS: Persistent left lower lung consolidation. Slight improvement in asymmetric perihilar and bibasilar interstitial edema or infiltrates left greater than right. Probable layering left pleural effusion. Heart size normal. Feeding tube has been placed at least as far as the stomach,  tip not seen. IMPRESSION: 1. Persistent asymmetric infiltrates/edema left greater than right, with probable layering left effusion. Electronically Signed   By: Lucrezia Europe M.D.   On: 10/16/2015 09:59     Scheduled Meds: . antiseptic oral rinse  7 mL Mouth Rinse q12n4p  . atorvastatin  20 mg Per Tube QPM  . chlorhexidine  15 mL Mouth Rinse BID  . diazepam  5 mg Per Tube TID  . escitalopram  10 mg Per Tube Daily  . heparin subcutaneous  5,000 Units Subcutaneous 3 times per day  . ipratropium-albuterol  3 mL Nebulization Q6H  . metoprolol tartrate  12.5 mg Per Tube BID  . pantoprazole sodium  40 mg Per Tube Daily  . piperacillin-tazobactam (ZOSYN)  IV  3.375 g Intravenous 3 times per day  . [START ON 10/17/2015] predniSONE  30 mg Per Tube Q breakfast  . QUEtiapine  50 mg Per Tube q morning - 10a  . risperiDONE  0.5 mg Per Tube BID  . sennosides  5 mL Per Tube BID  . thiamine  100 mg Per Tube Daily  . vancomycin  1,000 mg Intravenous Q12H   Continuous Infusions: . feeding supplement (JEVITY 1.2 CAL) 1,000 mL (10/16/15 1045)   PRN Meds: acetaminophen (TYLENOL) oral liquid 160 mg/5 mL, albuterol, simethicone  Time spent: 30 minutes  Author: Berle Mull, MD [facility] Pager: [phone] 10/16/2015 1:00 PM  If 7PM-7AM, please contact night-coverage at [email or web address], password [facility]

## 2015-10-16 NOTE — Progress Notes (Signed)
Attempted to engage pt and/or family in [place] initial consult discussion. He is too lethargic to participate in conversation. Opens his eyes to voice then falls asleep. Will attempt to reach family. PMT will continue to follow. Thank you,  Romona Curls, ANP

## 2015-10-17 DIAGNOSIS — Z515 Encounter for palliative care: Secondary | ICD-10-CM

## 2015-10-17 LAB — BASIC METABOLIC PANEL
ANION GAP: 5 (ref 5–15)
BUN: 6 mg/dL (ref 6–20)
CO2: 41 mmol/L — ABNORMAL HIGH (ref 22–32)
Calcium: 8.5 mg/dL — ABNORMAL LOW (ref 8.9–10.3)
Chloride: 99 mmol/L — ABNORMAL LOW (ref 101–111)
Creatinine, Ser: 0.67 mg/dL (ref 0.61–1.24)
GLUCOSE: 97 mg/dL (ref 65–99)
Potassium: 3.6 mmol/L (ref 3.5–5.1)
SODIUM: 145 mmol/L (ref 135–145)

## 2015-10-17 LAB — CBC
HCT: 32.2 % — ABNORMAL LOW (ref 39.0–52.0)
HEMOGLOBIN: 9.7 g/dL — AB (ref 13.0–17.0)
MCH: 28.4 pg (ref 26.0–34.0)
MCHC: 30.1 g/dL (ref 30.0–36.0)
MCV: 94.4 fL (ref 78.0–100.0)
Platelets: 439 10*3/uL — ABNORMAL HIGH (ref 150–400)
RBC: 3.41 MIL/uL — ABNORMAL LOW (ref 4.22–5.81)
RDW: 14.7 % (ref 11.5–15.5)
WBC: 8.5 10*3/uL (ref 4.0–10.5)

## 2015-10-17 LAB — GLUCOSE, CAPILLARY
GLUCOSE-CAPILLARY: 91 mg/dL (ref 65–99)
GLUCOSE-CAPILLARY: 88 mg/dL (ref 65–99)

## 2015-10-17 LAB — MAGNESIUM: MAGNESIUM: 1.9 mg/dL (ref 1.7–2.4)

## 2015-10-17 MED ORDER — PREDNISONE 20 MG PO TABS
20.0000 mg | ORAL_TABLET | Freq: Every day | ORAL | Status: DC
  Administered 2015-10-18 – 2015-10-20 (×3): 20 mg via ORAL
  Filled 2015-10-17 (×4): qty 1

## 2015-10-17 MED ORDER — METOPROLOL TARTRATE 12.5 MG HALF TABLET
12.5000 mg | ORAL_TABLET | Freq: Two times a day (BID) | ORAL | Status: DC
  Administered 2015-10-17 – 2015-10-20 (×7): 12.5 mg via ORAL
  Filled 2015-10-17 (×7): qty 1

## 2015-10-17 MED ORDER — VITAMIN B-1 100 MG PO TABS
100.0000 mg | ORAL_TABLET | Freq: Every day | ORAL | Status: DC
  Administered 2015-10-17 – 2015-10-20 (×4): 100 mg via ORAL
  Filled 2015-10-17 (×4): qty 1

## 2015-10-17 MED ORDER — PANTOPRAZOLE SODIUM 40 MG PO TBEC
40.0000 mg | DELAYED_RELEASE_TABLET | Freq: Every day | ORAL | Status: DC
  Administered 2015-10-17 – 2015-10-20 (×4): 40 mg via ORAL
  Filled 2015-10-17 (×4): qty 1

## 2015-10-17 MED ORDER — QUETIAPINE FUMARATE 25 MG PO TABS
50.0000 mg | ORAL_TABLET | Freq: Every morning | ORAL | Status: DC
  Administered 2015-10-17 – 2015-10-20 (×4): 50 mg via ORAL
  Filled 2015-10-17: qty 2
  Filled 2015-10-17: qty 1
  Filled 2015-10-17 (×2): qty 2

## 2015-10-17 MED ORDER — SENNA 8.6 MG PO TABS
1.0000 | ORAL_TABLET | Freq: Two times a day (BID) | ORAL | Status: DC
  Administered 2015-10-17 – 2015-10-20 (×7): 8.6 mg via ORAL
  Filled 2015-10-17 (×7): qty 1

## 2015-10-17 MED ORDER — ATORVASTATIN CALCIUM 20 MG PO TABS
20.0000 mg | ORAL_TABLET | Freq: Every evening | ORAL | Status: DC
  Administered 2015-10-17 – 2015-10-19 (×3): 20 mg via ORAL
  Filled 2015-10-17 (×4): qty 1

## 2015-10-17 MED ORDER — PREDNISONE 20 MG PO TABS: 30.0000 mg | ORAL_TABLET | Freq: Every day | ORAL | Status: DC

## 2015-10-17 MED ORDER — ESCITALOPRAM OXALATE 10 MG PO TABS
10.0000 mg | ORAL_TABLET | Freq: Every day | ORAL | Status: DC
  Administered 2015-10-17 – 2015-10-20 (×4): 10 mg via ORAL
  Filled 2015-10-17 (×4): qty 1

## 2015-10-17 MED ORDER — OXYCODONE-ACETAMINOPHEN 5-325 MG PO TABS
1.0000 | ORAL_TABLET | Freq: Three times a day (TID) | ORAL | Status: DC | PRN
  Administered 2015-10-19 – 2015-10-20 (×3): 1 via ORAL
  Filled 2015-10-17 (×3): qty 1

## 2015-10-17 MED ORDER — RISPERIDONE 0.5 MG PO TABS
0.5000 mg | ORAL_TABLET | Freq: Two times a day (BID) | ORAL | Status: DC
  Administered 2015-10-17 – 2015-10-20 (×7): 0.5 mg via ORAL
  Filled 2015-10-17 (×10): qty 1

## 2015-10-17 NOTE — Progress Notes (Signed)
Speech Language Pathology Treatment: Dysphagia  Patient Details Name: Zachary Walls MRN: [id number] DOB: 05-Jun-1956 Today's Date: 10/17/2015 Time: [id number] SLP Time Calculation (min) (ACUTE ONLY): 15 min  Assessment / Plan / Recommendation Clinical Impression  Pt alert, nasal canula, stable respiratory rate and eager for food/liquids. Exhibited functional oral and pharyngeal phases of swallow. Slight delayed throat clear x 1 with straw. No indications of airway compromise or pharyngeal residue. Recommend regular texture, thin liquids, pills with thin and follow up x 1.   HPI HPI: Zachary Walls is a 59 y.o. male with COPD, hypertension, hyperlipidemia, GERD, tobacco abuse, alcohol abuse, coronary artery disease, peripheral vascular disease, history of cardiac arrest, bowel resection with colostomy who presented with respiratory failure due to L HCAP superimposed on COPD.       SLP Plan  Continue with current plan of care     Recommendations  Diet recommendations: Regular;Thin liquid Liquids provided via: Cup;Straw Medication Administration: Whole meds with liquid Supervision: Patient able to self feed;Intermittent supervision to cue for compensatory strategies Compensations: Slow rate;Small sips/bites Postural Changes and/or Swallow Maneuvers: Seated upright 90 degrees              Oral Care Recommendations: Oral care BID Follow up Recommendations: None Plan: Continue with current plan of care   Zachary Walls 10/17/2015, 9:25 AM  Zachary Walls Zachary Walls.Ed [employer] [phone]

## 2015-10-17 NOTE — Progress Notes (Addendum)
Pt. Transferred from [facility]. Alert and oriented X 4.  At about 1815, Pt complained of shortness of breath. O2 sat 55% on 2L o2. O2 increased to 4L, oxygen sat. Increased to 91%. Pt continued to complain of shortness of breath. Respiratory staff notified. Oxygen saturation eventually got up to 100% on 4L /[place]. Patient was not ambulated this evening due to respiratory issue. Will continue to observe.

## 2015-10-17 NOTE — Progress Notes (Signed)
[facility] Progress Note    Patient: Zachary Walls    OEV:[id number]  DOB: [birth year]     DOA: 10/08/2015 Date of Service: the patient was seen and examined on 10/17/2015  Subjective: Patient pulled out his NG tube last night. Was agitated and requesting to eat. Patient passed a swallowing eval this morning. Breathing is improved significantly. Denies any nausea or vomiting. Nutrition: Was nothing by mouth last night Activity: Ambulating in the hallway Last BM: 10/17/2015 via colostomy  Assessment and Plan: 1. Severe sepsis ([place])  Healthcare associated pneumonia Partial collapse of the left lung from mucous plug Acute on chronic hypoxic respiratory failure with chronic COPD and hypercapnia  presented with episode of unresponsiveness requiring intubation in the field, patient was never pulseless. Lactic acid was 8, received O2 by exchange in the ER on arrival. X-ray showed left upper lung partial collapse probably mucous blood. Met criteria for sepsis with leukocytosis, lactic acidosis, hypotension and hypothermia. Required IV resuscitation followed by phenylephrine drip, fentanyl drip as well as Precedex Extubated on 10/13/2015 Blood culture negative for 5 days Tracheal aspirate shows Candida albicans but no bacterial infection. On Levaquin Continue prednisone 30 mg by mouth, duo nebs.  Transferring the patient out of the stepdown unit telemetry since his progressing well with start tapering prednisone starting tomorrow  2. Dysphagia Severe protein calorie malnutrition NG tube insertion Patient has prior severe protein calorie malnutrition. Failed a swallowing evaluation after extubation.  Patient has passed a swallowing eval with advance diet to regular diet. We will follow recommendation of the dietary. Ostomy output 160, Monitor output from colostomy.  3. Acute encephalopathy Severe agitation  Drinks 1-3 40 ounce beers most of the days. Probable alcohol withdrawal  with delirium tremens. Continues to show no evidence of agitation Will continue monitoring. Continue thiamine and folic acid. Use when necessary lorazepam for agitation. Continue Seroquel and risperidone.  4. Leukocytosis Resolved. Will continue monitoring.  5. Mood disorder. Patient is on Lexapro, Seroquel, risperidone at home. We will continue.  6. Hypokalemia. Resolved   7. Goals of care discussion Patient was intubated on arrival. Earlier discussion with the power of attorney patient's daughter, by critical care, patient is a DO NOT RESUSCITATE. Appreciate palliative care to help in discussion with the family about deciding goals of care.  DVT Prophylaxis: subcutaneous Heparin Nutrition: On tube feeding Advance goals of care discussion: DNR/DNI as per discussion by the earlier team critical care  Brief Summary of Hospitalization:  HPI: As per the H and P dictated on admission, "Zachary Walls is a 59 y.o. male with COPD, hypertension, hyperlipidemia, GERD, tobacco abuse, alcohol abuse, coronary artery disease, peripheral vascular disease, history of cardiac arrest, bowel resection with colostomy who presented with respiratory failure.  Per reports from the ED staff, he was found unresponsive in his SNF with a GCS of 3 but maintaining a pulse. He was intubated in the field and brought to the [facility] ED. He was found to have hypoxemia (SpO2 50%s) on the ventilator and collapse of the left upper lobe on CXR. The ED performed a replacement of his ETT with a 7.20F tube. He remained very hypoxic with PaO2 in the 50s despite FiO2 100%, PEEP 5. His lactate was 8, BP low and hypothermic on arrival which improved after fluid resuscitation, antibiotics and active external warming. MICU was called then for admission. His mental status also recovered to him making eye contact and flailing all extremities for which he was given boluses of  fentanyl and versed.  No further history was obtainable as  the patient is intubated and sedated. His family was attempted to be reached on multiple phone numbers which went to disconnected lines or un-identified voice mailboxes."  Daily update, Procedures: Intubation on admission 10/09/2015 Extubation 10/13/2015 Transferred to step down 10/14/2015, 10/17/2015 passed swallowing evaluation, transferring out of the stepdown unit Consultants: Primary team critical care service, palliative care medicine Antibiotics: Anti-infectives    Start     Dose/Rate Route Frequency Ordered Stop   10/16/15 1600  levofloxacin (LEVAQUIN) IVPB 750 mg     750 mg 100 mL/hr over 90 Minutes Intravenous Every 24 hours 10/16/15 1529     10/16/15 1400  levofloxacin (LEVAQUIN) tablet 750 mg  Status:  Discontinued     750 mg Oral Daily 10/16/15 1309 10/16/15 1529   10/12/15 1330  vancomycin (VANCOCIN) IVPB 1000 mg/200 mL premix  Status:  Discontinued     1,000 mg 200 mL/hr over 60 Minutes Intravenous Every 12 hours 10/12/15 1321 10/16/15 1304   10/10/15 1200  vancomycin (VANCOCIN) IVPB 750 mg/150 ml premix  Status:  Discontinued     750 mg 150 mL/hr over 60 Minutes Intravenous Every 12 hours 10/10/15 0852 10/12/15 1321   10/10/15 0000  vancomycin (VANCOCIN) IVPB 750 mg/150 ml premix  Status:  Discontinued     750 mg 150 mL/hr over 60 Minutes Intravenous Every 24 hours 10/09/15 0248 10/09/15 1004   10/10/15 0000  vancomycin (VANCOCIN) IVPB 1000 mg/200 mL premix  Status:  Discontinued     1,000 mg 200 mL/hr over 60 Minutes Intravenous Every 24 hours 10/09/15 1004 10/10/15 0851   10/09/15 0800  piperacillin-tazobactam (ZOSYN) IVPB 3.375 g  Status:  Discontinued     3.375 g 12.5 mL/hr over 240 Minutes Intravenous 3 times per day 10/09/15 0248 10/16/15 1304   10/09/15 0030  vancomycin (VANCOCIN) IVPB 1000 mg/200 mL premix     1,000 mg 200 mL/hr over 60 Minutes Intravenous  Once 10/09/15 0016 10/09/15 0216   10/09/15 0030  piperacillin-tazobactam (ZOSYN) IVPB 2.25 g     2.25  g 100 mL/hr over 30 Minutes Intravenous  Once 10/09/15 0016 10/10/15 7829      Family Communication: no family was present at bedside, at the time of interview.   Disposition:  Expected discharge date: 10/19/2015 Barriers to safe discharge: Improvement in respiratory status   Intake/Output Summary (Last 24 hours) at 10/17/15 1213 Last data filed at 10/17/15 1112  Gross per 24 hour  Intake    150 ml  Output   1835 ml  Net  -1685 ml   Filed Weights   10/15/15 0344 10/16/15 0356 10/17/15 0315  Weight: 53.5 kg (117 lb 15.1 oz) 53.3 kg (117 lb 8.1 oz) 51.6 kg (113 lb 12.1 oz)    Objective: Physical Exam: Filed Vitals:   10/17/15 0315 10/17/15 0727 10/17/15 0821 10/17/15 1113  BP: 127/77  123/69   Pulse: 74  76   Temp: 98.3 F (36.8 C)  97.6 F (36.4 C) 98.1 F (36.7 C)  TempSrc: Axillary  Oral Oral  Resp: 23  17   Height:      Weight: 51.6 kg (113 lb 12.1 oz)     SpO2: 99% 95% 97%     General: Appear in moderate distress, no Rash; Oral Mucosa moist. Cardiovascular: S1 and S2 Present, no Murmur, difficult to assess JVD Respiratory: Bilateral Air entry present and bilateral Crackles, no wheezes Abdomen: Bowel Sound present, Soft and no  tenderness Extremities: no Pedal edema, no calf tenderness  Data Reviewed: CBC:  Recent Labs Lab 10/13/15 0724 10/14/15 0300 10/15/15 0649 10/16/15 0530 10/17/15 0342  WBC 13.4* 16.0* 18.1* 13.9* 8.5  NEUTROABS  --   --   --  10.4*  --   HGB 10.4* 10.6* 12.0* 10.5* 9.7*  HCT 31.6* 34.0* 39.3 32.8* 32.2*  MCV 90.8 94.4 94.2 92.1 94.4  PLT 314 377 491* 441* 132*   Basic Metabolic Panel:  Recent Labs Lab 10/11/15 0350 10/12/15 0335 10/13/15 0724 10/14/15 0300 10/15/15 0649 10/16/15 0530 10/17/15 0342  NA 138 138 138 141 145 143 145  K 3.3* 3.9 3.7 4.3 3.2* 2.9* 3.6  CL 97* 99* 100* 99* 95* 95* 99*  CO2 35* 31 30 34* 42* 42* 41*  GLUCOSE 172* 162* 134* 116* 98 120* 97  BUN 15 21* 21* '19 16 11 6  '$ CREATININE 0.73 0.76  0.67 0.69 0.88 0.77 0.67  CALCIUM 8.3* 8.6* 8.3* 8.8* 8.7* 8.3* 8.5*  MG  --  1.8 2.2 2.1 2.0  --  1.9  PHOS 2.9  --  2.9 2.4* 4.0  --   --    Liver Function Tests: No results for input(s): AST, ALT, ALKPHOS, BILITOT, PROT, ALBUMIN in the last 168 hours. No results for input(s): LIPASE, AMYLASE in the last 168 hours. No results for input(s): AMMONIA in the last 168 hours.  Cardiac Enzymes: No results for input(s): CKTOTAL, CKMB, CKMBINDEX, TROPONINI in the last 168 hours.  BNP (last 3 results)  Recent Labs  09/23/15 1842  BNP 143.0*    CBG:  Recent Labs Lab 10/16/15 1555 10/16/15 1930 10/16/15 2326 10/17/15 0313 10/17/15 0826  GLUCAP 99 80 78 91 88    Recent Results (from the past 240 hour(s))  Blood culture (routine x 2)     Status: None   Collection Time: 10/08/15 10:20 PM  Result Value Ref Range Status   Specimen Description BLOOD LEFT WRIST  Final   Special Requests BOTTLES DRAWN AEROBIC ONLY 4CC  Final   Culture NO GROWTH 5 DAYS  Final   Report Status 10/14/2015 FINAL  Final  Blood culture (routine x 2)     Status: None   Collection Time: 10/08/15 10:54 PM  Result Value Ref Range Status   Specimen Description BLOOD RIGHT WRIST  Final   Special Requests BOTTLES DRAWN AEROBIC ONLY 5CC  Final   Culture NO GROWTH 5 DAYS  Final   Report Status 10/13/2015 FINAL  Final  MRSA PCR Screening     Status: None   Collection Time: 10/09/15  3:19 AM  Result Value Ref Range Status   MRSA by PCR NEGATIVE NEGATIVE Final    Comment:        The GeneXpert MRSA Assay (FDA approved for NASAL specimens only), is one component of a comprehensive MRSA colonization surveillance program. It is not intended to diagnose MRSA infection nor to guide or monitor treatment for MRSA infections.   Culture, respiratory (NON-Expectorated)     Status: None   Collection Time: 10/09/15 10:03 AM  Result Value Ref Range Status   Specimen Description TRACHEAL ASPIRATE  Final   Special  Requests NONE  Final   Gram Stain   Final    FEW WBC PRESENT,BOTH PMN AND MONONUCLEAR RARE SQUAMOUS EPITHELIAL CELLS PRESENT NO ORGANISMS SEEN Performed at [facility]    Culture   Final    FEW CANDIDA ALBICANS Performed at [facility]  Report Status 10/11/2015 FINAL  Final     Studies: No results found.   Scheduled Meds: . antiseptic oral rinse  7 mL Mouth Rinse q12n4p  . atorvastatin  20 mg Oral QPM  . chlorhexidine  15 mL Mouth Rinse BID  . escitalopram  10 mg Oral Daily  . heparin subcutaneous  5,000 Units Subcutaneous 3 times per day  . ipratropium-albuterol  3 mL Nebulization Q6H  . levofloxacin (LEVAQUIN) IV  750 mg Intravenous Q24H  . metoprolol tartrate  12.5 mg Oral BID  . pantoprazole  40 mg Oral Daily  . [START ON 10/18/2015] predniSONE  30 mg Oral Q breakfast  . QUEtiapine  50 mg Oral q morning - 10a  . risperiDONE  0.5 mg Oral BID  . senna  1 tablet Oral BID  . thiamine  100 mg Oral Daily   Continuous Infusions: . feeding supplement (JEVITY 1.2 CAL) 1,000 mL (10/16/15 1045)   PRN Meds: acetaminophen (TYLENOL) oral liquid 160 mg/5 mL, albuterol, fentaNYL (SUBLIMAZE) injection, haloperidol lactate, LORazepam, simethicone  Time spent: 30 minutes  Author: Berle Mull, MD [facility] Pager: [phone] 10/17/2015 12:13 PM  If 7PM-7AM, please contact night-coverage at [email or web address], password [facility]

## 2015-10-17 NOTE — Progress Notes (Signed)
I spoke with Mr. Hollett this morning.  His clinical condition continues to improve over the past 48 hours per chart review and speaking with his bedside nurse.  He tells me he is excited to have "real food" for lunch and that his nurse is going to take him for a walk to get out of his room.  He remembers portions of events of this hospitalization, but still has limited insight into his current medical condition and events of this hospitalization.  He tells me that his children help with his medical decision making and that I should call TJ, his son, or Scheryl Darter, his daughter.  I called spoke with Scheryl Darter and we are planning to meet tomorrow at 2:30 PM for a family meeting. I think that it would be beneficial to complete a MOST form with Mr. Wamboldt and his family prior to discharge. I talked briefly with her about this today and she states that she would be in agreement that this would be beneficial to do.  Family meeting 12/27 at 2:30 PM.  Micheline Rough, MD [place] Team [phone]

## 2015-10-17 NOTE — Progress Notes (Signed)
Patient to transfer to 2W08 report given to receiving nurse all questions answered at this time.  Pt. VSS with no s/s of distress noted.  Pt. Stable at transfer.

## 2015-10-17 NOTE — Progress Notes (Signed)
Patient has been transferred to 2W and written handoff provided for unit CSW.  Resident of [place] with current plan to return there.  CSW left message for [place], Admissions at [facility] with clinical update. MD note states possible stability on 10/19/15. CSW services will continue to monitor.  Lorie Phenix. Pauline Good, [place]

## 2015-10-18 ENCOUNTER — Encounter (HOSPITAL_COMMUNITY): Payer: Self-pay | Admitting: General Practice

## 2015-10-18 DIAGNOSIS — Z7189 Other specified counseling: Secondary | ICD-10-CM

## 2015-10-18 LAB — BASIC METABOLIC PANEL
ANION GAP: 6 (ref 5–15)
BUN: <5 mg/dL — ABNORMAL LOW (ref 6–20)
CHLORIDE: 97 mmol/L — AB (ref 101–111)
CO2: 40 mmol/L — AB (ref 22–32)
Calcium: 8.6 mg/dL — ABNORMAL LOW (ref 8.9–10.3)
Creatinine, Ser: 0.73 mg/dL (ref 0.61–1.24)
GFR calc Af Amer: >60 mL/min (ref >60)
GFR calc non Af Amer: >60 mL/min (ref >60)
GLUCOSE: 92 mg/dL (ref 65–99)
POTASSIUM: 4 mmol/L (ref 3.5–5.1)
Sodium: 143 mmol/L (ref 135–145)

## 2015-10-18 LAB — CBC
HCT: 33 % — ABNORMAL LOW (ref 39.0–52.0)
Hemoglobin: 10.1 g/dL — ABNORMAL LOW (ref 13.0–17.0)
MCH: 29 pg (ref 26.0–34.0)
MCHC: 30.6 g/dL (ref 30.0–36.0)
MCV: 94.8 fL (ref 78.0–100.0)
PLATELETS: 466 10*3/uL — AB (ref 150–400)
RBC: 3.48 MIL/uL — ABNORMAL LOW (ref 4.22–5.81)
RDW: 14.7 % (ref 11.5–15.5)
WBC: 8.1 10*3/uL (ref 4.0–10.5)

## 2015-10-18 MED ORDER — LEVOFLOXACIN 500 MG PO TABS
500.0000 mg | ORAL_TABLET | Freq: Every day | ORAL | Status: AC
  Administered 2015-10-18 – 2015-10-19 (×2): 500 mg via ORAL
  Filled 2015-10-18 (×2): qty 1

## 2015-10-18 NOTE — Progress Notes (Signed)
PT demonstrated verbal and hands on understanding of Flutter device. 

## 2015-10-18 NOTE — Consult Note (Signed)
Consultation Note Date: 10/18/2015   Patient Name: Zachary Walls  DOB: [birth year]  MRN: [id number]  Age / Sex: 59 y.o., male  PCP: Everardo Beals, NP Referring Physician: Lavina Hamman, MD  Reason for Consultation: Establishing goals of care  Clinical Assessment/Narrative: Zachary Walls is a 59 year old male with COPD, hypertension, hyperlipidemia, GERD, tobacco abuse, alcohol abuse, coronary artery disease, peripheral vascular disease, history of cardiac arrest, bowel resection with colostomy who presented with respiratory failure.  He had difficulty weaning from ventilator. PCCM discussed with his daughter and he was extubated and transitioned to DNR/do not reintubate.  He has shown improvement over the last few days, but continues to have periods of desaturation.  Palliative consulted for [facility].  Met with patient, his daughter, and Dr. Posey Pronto.  The patient reports that the things that are most important to him are his family, being out of the hospital, and living as well as possible for as long as possible.  We discussed that in light of multiple chronic medical problems that have worsened with this acute problem, care should be focused on interventions that are likely to allow the patient to achieve goal of getting back out of the hospital and spending time with family. Discussed regarding heroic interventions at the end-of-life and they agree this would not be in line with prior expressed wishes for a natural death or be likely to lead to getting well enough to go back home. They were in agreement with CODE STATUS of DO NOT RESUSCITATE.  We discussed that the hospital can be useful as long as he is getting well enough from care he receives at the hospital to enjoy his time outside the hospital, but we have reached a point where, if his goal is to be out of the hospital, he may be better served to plan on being out of the  hospital and bringing care to him rather repeated trips to the hospital. We discussed hospice as a tool that may be beneficial in this goal when he reaches a point where we are trying to fix problems that are not fixable.  Contacts/Participants in Discussion: Patient, his daughter Primary Decision Maker: Patient's daughter, Zachary Walls   Relationship to Patient daughter HCPOA: No   SUMMARY OF RECOMMENDATIONS -  Code status DNR/DNI - Discussed options moving forward. His daughter is going to discuss hospice at long term care facility with her brother, Zachary Walls.  She feels that this is likely their best option.  Patient still seems somewhat confused at times, but he reports being agreeable to this plan.  He is clear that he wants his children to make decisions on his behalf as he has difficulty following his medical course.  - I spoke with case management and social work will work to determine options for discharge to facility other than [facility] with support of hospice if possible.  Code Status/Advance Care Planning: DNR    Code Status Orders        Start     Ordered   10/14/15 0943  Do not attempt resuscitation (DNR)   Continuous    Question Answer Comment  Maintain current active treatments Yes   Do not initiate new interventions Yes      10/14/15 0953     Palliative Prophylaxis:   Bowel Regimen and Delirium Protocol  Additional Recommendations (Limitations, Scope, Preferences):  Avoid Hospitalization  Psycho-social/Spiritual:  Support System: Zachary Walls Desire for further Chaplaincy support:No Additional Recommendations: Education on Hospice  Prognosis: <6  months  Discharge Planning: [facility] is most likely preference of patient and his family.  His daughter is going to speak with her brother regarding plan.  I talked with case management and social work will work on options for discharge to facility other than [facility] with the support of  hospice.   Chief Complaint/ Primary Diagnoses: Present on Admission:  . Respiratory failure, acute ([place]) . Dependence on ventilator, status ([place]) . Respiratory failure, acute and chronic ([place]) . COPD (chronic obstructive pulmonary disease) ([place]) . History of ETT . Pneumonia . Hypertension . Severe protein-calorie malnutrition ([place]) . Severe sepsis ([place])  I have reviewed the medical record, interviewed the patient and family, and examined the patient. The following aspects are pertinent.  Past Medical History  Diagnosis Date  . COPD (chronic obstructive pulmonary disease) ([place])   . History of pneumonia   . Hypertension     a. Normotensive 08/2013 off meds.  [name] HOH (hard of hearing)   . Protein calorie malnutrition ([place])   . ETOH abuse   . Tobacco abuse   . Diverticulitis     a. s/p colon resection and colostomy  . Cardiac arrest ([place])     a. 01/2013, felt to be 2/2 severe COPD req trach;  b. 01/2013 Echo: EF 65-70% w/o rwma.  . Difficult intubation   . CAD (coronary artery disease)     a. Nonobstructive CAD by cath 08/2013 (mod RCA, mid LAD myocardial bridge) - ECG was concerning for inf STEMI but pt ruled out. CP possibly GERD.   [name] Hyponatremia     a. During 08/2013 felt due to polydipsia.  . Polysubstance abuse     a. Tobacco, marijuana, alcoholism.  [name] Anxiety   . Shortness of breath   . Peripheral vascular disease ([place])   . Prostate cancer [facility])    Social History   Social History  . Marital Status: Single    Spouse Name: N/A  . Number of Children: 2  . Years of Education: N/A   Occupational History  . Contractor     Social History Main Topics  . Smoking status: Current Every Day Smoker -- 0.50 packs/day for 44 years    Types: Cigarettes    Last Attempt to Quit: 11/03/2011  . Smokeless tobacco: Never Used     Comment: Has smoked as much as 2-3 packs/day.  Now can make a pack last a week.  . Alcohol Use: No     Comment: Previously drank heavily.  Admits to  drinking 1-3 40 oz beers most days of the week.  . Drug Use: Yes    Special: Marijuana     Comment: Smokes Marijuana "when I can get it," (most days of the week).  . Sexual Activity: Not Currently   Other Topics Concern  . None   Social History Narrative   Lives in [place] by himself.  He does not work.   Family History  Problem Relation Age of Onset  . Heart attack Father   . Cancer Father     unknown type  . HIV Brother    Scheduled Meds: . antiseptic oral rinse  7 mL Mouth Rinse q12n4p  . atorvastatin  20 mg Oral QPM  . chlorhexidine  15 mL Mouth Rinse BID  . escitalopram  10 mg Oral Daily  . heparin subcutaneous  5,000 Units Subcutaneous 3 times per day  . ipratropium-albuterol  3 mL Nebulization Q6H  . levofloxacin  500 mg  Oral q1800  . metoprolol tartrate  12.5 mg Oral BID  . pantoprazole  40 mg Oral Daily  . predniSONE  20 mg Oral Q breakfast  . QUEtiapine  50 mg Oral q morning - 10a  . risperiDONE  0.5 mg Oral BID  . senna  1 tablet Oral BID  . thiamine  100 mg Oral Daily   Continuous Infusions:  PRN Meds:.acetaminophen (TYLENOL) oral liquid 160 mg/5 mL, albuterol, fentaNYL (SUBLIMAZE) injection, LORazepam, oxyCODONE-acetaminophen, simethicone Medications Prior to Admission:  Prior to Admission medications   Medication Sig Start Date End Date Taking? Authorizing Provider  albuterol (PROVENTIL) (2.5 MG/3ML) 0.083% nebulizer solution Take 3 mLs (2.5 mg total) by nebulization every 4 (four) hours as needed for wheezing or shortness of breath. 11/22/14  Yes Sherwood Gambler, MD  atorvastatin (LIPITOR) 20 MG tablet Take 1 tablet (20 mg total) by mouth every evening. 08/09/14  Yes Noland Fordyce, PA-C  diazepam (VALIUM) 5 MG tablet Take 1 tablet (5 mg total) by mouth 3 (three) times daily. 09/27/15  Yes Dustin Flock, MD  docusate sodium 100 MG CAPS Take 100 mg by mouth 2 (two) times daily. 02/06/14  Yes Nat Christen, PA-C  escitalopram (LEXAPRO) 10 MG tablet Take 1 tablet  (10 mg total) by mouth daily. 11/26/13  Yes Annita Brod, MD  feeding supplement, ENSURE ENLIVE, (ENSURE ENLIVE) LIQD Take 237 mLs by mouth 3 (three) times daily with meals. 09/27/15  Yes Dustin Flock, MD  guaiFENesin (MUCINEX) 600 MG 12 hr tablet Take 1 tablet (600 mg total) by mouth 2 (two) times daily. 09/27/15  Yes Dustin Flock, MD  losartan (COZAAR) 50 MG tablet Take 1 tablet (50 mg total) by mouth daily. 08/09/14  Yes Noland Fordyce, PA-C  metoprolol tartrate (LOPRESSOR) 25 MG tablet Take 25 mg by mouth 2 (two) times daily.   Yes Historical Provider, MD  omeprazole (PRILOSEC) 20 MG capsule Take 1 capsule (20 mg total) by mouth daily. 08/09/14  Yes Noland Fordyce, PA-C  QUEtiapine (SEROQUEL) 50 MG tablet Take 1 tablet (50 mg total) by mouth every morning. 06/28/14  Yes Antonietta Breach, PA-C  risperiDONE (RISPERDAL) 0.5 MG tablet Take 1 tablet (0.5 mg total) by mouth 2 (two) times daily. 11/26/13  Yes Annita Brod, MD  thiamine (VITAMIN B-1) 100 MG tablet Take 100 mg by mouth daily.   Yes Historical Provider, MD  tiotropium (SPIRIVA) 18 MCG inhalation capsule Place 1 capsule (18 mcg total) into inhaler and inhale daily. 06/28/14  Yes Antonietta Breach, PA-C  albuterol (PROVENTIL HFA;VENTOLIN HFA) 108 (90 BASE) MCG/ACT inhaler Inhale 2 puffs into the lungs every 6 (six) hours as needed for wheezing or shortness of breath (wheezing).    Historical Provider, MD  nitroGLYCERIN (NITROSTAT) 0.4 MG SL tablet Place 1 tablet (0.4 mg total) under the tongue every 5 (five) minutes as needed for chest pain (up to 3 doses). 08/09/14   Noland Fordyce, PA-C  oxyCODONE-acetaminophen (PERCOCET) 5-325 MG tablet Take 1 tablet by mouth every 8 (eight) hours as needed for severe pain. 09/27/15   Dustin Flock, MD   Allergies  Allergen Reactions  . Bee Venom Shortness Of Breath and Swelling  . Shellfish Allergy Anaphylaxis  . Codeine Itching and Rash    Review of Systems  Constitutional: Positive for activity change,  appetite change and fatigue.  Respiratory: Positive for cough, shortness of breath and wheezing.   Psychiatric/Behavioral: Positive for confusion and decreased concentration.    Physical Exam General: Alert, awake, in  no acute distress but mildly increased work of breathing.  HEENT: No bruits, no goiter, no JVD Heart: Regular rate and rhythm. No murmur appreciated. Lungs: Poor air movement Abdomen: Soft, nontender, nondistended, positive bowel sounds.  Ext: Thin, No significant edema Skin: Warm and dry Neuro: Grossly intact, nonfocal. Confused  Vital Signs: BP 135/83 mmHg  Pulse 76  Temp(Src) 98.2 F (36.8 C) (Oral)  Resp 18  Ht _0  (1.702 m)  Wt 51.483 kg (113 lb 8 oz)  BMI 17.77 kg/m2  SpO2 100%  SpO2: SpO2: 100 % O2 Device:SpO2: 100 % O2 Flow Rate: .O2 Flow Rate (L/min): 4 L/min  IO: Intake/output summary:  Intake/Output Summary (Last 24 hours) at 10/18/15 2243 Last data filed at 10/18/15 2117  Gross per 24 hour  Intake    360 ml  Output   1550 ml  Net  -1190 ml    LBM: Last BM Date: 10/17/15 Baseline Weight: Weight: 47.1 kg (103 lb 13.4 oz) Most recent weight: Weight: 51.483 kg (113 lb 8 oz)      Palliative Assessment/Data:  Flowsheet Rows        Most Recent Value   Intake Tab    Referral Department  Hospitalist   Unit at Time of Referral  ICU   Palliative Care Primary Diagnosis  Pulmonary   Date Notified  10/15/15   Date of Admission  10/08/15   Date first seen by Palliative Care  10/16/15   # of days Palliative referral response time  1 Day(s)   # of days IP prior to Palliative referral  7   Clinical Assessment    Psychosocial & Spiritual Assessment    Palliative Care Outcomes       Additional Data Reviewed:  CBC:    Component Value Date/Time   WBC 8.1 10/18/2015 0440   WBC 9.5 10/12/2014 0900   HGB 10.1* 10/18/2015 0440   HGB 14.7 10/12/2014 0900   HCT 33.0* 10/18/2015 0440   HCT 44.4 10/12/2014 0900   PLT 466* 10/18/2015 0440   PLT  227 10/12/2014 0900   MCV 94.8 10/18/2015 0440   MCV 95 10/12/2014 0900   NEUTROABS 10.4* 10/16/2015 0530   NEUTROABS 6.1 10/12/2014 0900   LYMPHSABS 1.8 10/16/2015 0530   LYMPHSABS 2.1 10/12/2014 0900   MONOABS 1.5* 10/16/2015 0530   MONOABS 1.1* 10/12/2014 0900   EOSABS 0.2 10/16/2015 0530   EOSABS 0.1 10/12/2014 0900   BASOSABS 0.0 10/16/2015 0530   BASOSABS 0.1 10/12/2014 0900   Comprehensive Metabolic Panel:    Component Value Date/Time   NA 143 10/18/2015 0440   NA 141 10/04/2014 1705   K 4.0 10/18/2015 0440   K 3.9 10/04/2014 1705   CL 97* 10/18/2015 0440   CL 98 10/04/2014 1705   CO2 40* 10/18/2015 0440   CO2 36* 10/04/2014 1705   BUN <5* 10/18/2015 0440   BUN 7 10/04/2014 1705   CREATININE 0.73 10/18/2015 0440   CREATININE 0.85 10/04/2014 1705   GLUCOSE 92 10/18/2015 0440   GLUCOSE 89 10/04/2014 1705   CALCIUM 8.6* 10/18/2015 0440   CALCIUM 9.0 10/04/2014 1705   AST 30 10/08/2015 2208   AST 22 09/17/2014 1831   ALT 19 10/08/2015 2208   ALT 26 09/17/2014 1831   ALKPHOS 67 10/08/2015 2208   ALKPHOS 63 09/17/2014 1831   BILITOT 1.0 10/08/2015 2208   BILITOT 0.2 09/17/2014 1831   PROT 7.0 10/08/2015 2208   PROT 6.9 09/17/2014 1831   ALBUMIN 2.9* 10/08/2015  2208   ALBUMIN 3.4 09/17/2014 1831     Time In: 1450 Time Out: 1510 Time Total: 80 Greater than 50%  of this time was spent counseling and coordinating care related to the above assessment and plan.  Signed by: Micheline Rough, MD  Micheline Rough, MD  10/18/2015, 10:43 PM  Please contact Palliative Medicine Team phone at [phone] for questions and concerns.

## 2015-10-18 NOTE — Progress Notes (Signed)
[facility] Progress Note    Patient: Zachary Walls    LTJ:[id number]  DOB: 1956-03-16     DOA: 10/08/2015 Date of Service: the patient was seen and examined on 10/18/2015  Subjective: Patient had episodes of shortness of breath last night as well as earlier this morning. Saturation dropping down to 60s and 70s. Coming back on its own. Unclear etiology. Currently on my evaluation does not have any shortness of breath more than his baseline.  Nutrition: Able to tolerate oral diet Activity: Ambulating in the hallway Last BM: 10/17/2015 via colostomy  Assessment and Plan: 1. Severe sepsis ([place])  Healthcare associated pneumonia Partial collapse of the left lung from mucous plug Acute on chronic hypoxic respiratory failure with chronic COPD and hypercapnia  presented with episode of unresponsiveness requiring intubation in the field, patient was never pulseless. Lactic acid was 8, received O2 by exchange in the ER on arrival. X-ray showed left upper lung partial collapse probably mucous blood. Met criteria for sepsis with leukocytosis, lactic acidosis, hypotension and hypothermia. Required IV resuscitation followed by phenylephrine drip, fentanyl drip as well as Precedex Extubated on 10/13/2015 Blood culture negative for 5 days Tracheal aspirate shows Candida albicans but no bacterial infection. On Levaquin Continue prednisone 20 mg by mouth, duo nebs.  Continue monitoring on telemetry today  2. Dysphagia Severe protein calorie malnutrition NG tube insertion Patient has prior severe protein calorie malnutrition. Failed swallowing evaluation after extubation.  Patient has passed a swallowing eval with advance diet to regular diet. We will follow recommendation of the dietary. Monitor output from colostomy.  3. Acute encephalopathy Severe agitation  Drinks 1-3 40 ounce beers most of the days. Probable alcohol withdrawal with delirium tremens. Continues to show no evidence of  agitation Will continue monitoring. Continue thiamine and folic acid. Use when necessary lorazepam for agitation. Continue Seroquel and risperidone.  4. Leukocytosis Resolved. Will continue monitoring.  5. Mood disorder. Patient is on Lexapro, Seroquel, risperidone at home. We will continue.  6. Hypokalemia. Resolved   7. Goals of care discussion Patient was intubated on arrival. Earlier discussion with the power of attorney patient's daughter, by critical care, patient is a DO NOT RESUSCITATE. Appreciate palliative care to help in discussion with the family about deciding goals of care. Had a meeting with patient's daughter Zachary Walls and the patient. 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 It was discussed with the family that should the patient has intubation it would be difficult to extubate the patient again. And patient and the family has decided to go for DO NOT RESUSCITATE. Further it was discussed about focusing on comfort and staying out of the hospital. Patient wants to get better with the treatment. Family is currently discussing among themselves about further plan of care. At present as per the discussion in the presence of palliative care, most likely Is to Go for [place] with Hospice Support, case manager will be consulted as well as Education officer, museum will be consulted by the palliative care team. Highly appreciate that efforts  DVT Prophylaxis: subcutaneous Heparin Nutrition: On tube feeding Advance goals of care discussion: DNR/DNI as per discussion by the earlier team critical care  Brief Summary of Hospitalization:  HPI: As per the H and P dictated on admission, "Zachary Walls is a 59 y.o. male with COPD, hypertension, hyperlipidemia, GERD, tobacco abuse, alcohol abuse, coronary artery disease, peripheral vascular disease, history of cardiac arrest, bowel resection with colostomy  who presented with respiratory  failure.  Per reports from the ED staff, he was found unresponsive in his SNF with a GCS of 3 but maintaining a pulse. He was intubated in the field and brought to the [facility] ED. He was found to have hypoxemia (SpO2 50%s) on the ventilator and collapse of the left upper lobe on CXR. The ED performed a replacement of his ETT with a 7.25F tube. He remained very hypoxic with PaO2 in the 50s despite FiO2 100%, PEEP 5. His lactate was 8, BP low and hypothermic on arrival which improved after fluid resuscitation, antibiotics and active external warming. MICU was called then for admission. His mental status also recovered to him making eye contact and flailing all extremities for which he was given boluses of fentanyl and versed.  No further history was obtainable as the patient is intubated and sedated. His family was attempted to be reached on multiple phone numbers which went to disconnected lines or un-identified voice mailboxes."  Daily update, Procedures: Intubation on admission 10/09/2015 Extubation 10/13/2015 Transferred to step down 10/14/2015, 10/17/2015 passed swallowing evaluation, transferring out of the stepdown unit Consultants: Primary team critical care service, palliative care medicine Antibiotics: Anti-infectives    Start     Dose/Rate Route Frequency Ordered Stop   10/18/15 1800  levofloxacin (LEVAQUIN) tablet 500 mg     500 mg Oral Daily-1800 10/18/15 1341     10/16/15 1600  levofloxacin (LEVAQUIN) IVPB 750 mg  Status:  Discontinued     750 mg 100 mL/hr over 90 Minutes Intravenous Every 24 hours 10/16/15 1529 10/18/15 1341   10/16/15 1400  levofloxacin (LEVAQUIN) tablet 750 mg  Status:  Discontinued     750 mg Oral Daily 10/16/15 1309 10/16/15 1529   10/12/15 1330  vancomycin (VANCOCIN) IVPB 1000 mg/200 mL premix  Status:  Discontinued     1,000 mg 200 mL/hr over 60 Minutes Intravenous Every 12 hours 10/12/15 1321 10/16/15 1304   10/10/15 1200  vancomycin (VANCOCIN) IVPB 750  mg/150 ml premix  Status:  Discontinued     750 mg 150 mL/hr over 60 Minutes Intravenous Every 12 hours 10/10/15 0852 10/12/15 1321   10/10/15 0000  vancomycin (VANCOCIN) IVPB 750 mg/150 ml premix  Status:  Discontinued     750 mg 150 mL/hr over 60 Minutes Intravenous Every 24 hours 10/09/15 0248 10/09/15 1004   10/10/15 0000  vancomycin (VANCOCIN) IVPB 1000 mg/200 mL premix  Status:  Discontinued     1,000 mg 200 mL/hr over 60 Minutes Intravenous Every 24 hours 10/09/15 1004 10/10/15 0851   10/09/15 0800  piperacillin-tazobactam (ZOSYN) IVPB 3.375 g  Status:  Discontinued     3.375 g 12.5 mL/hr over 240 Minutes Intravenous 3 times per day 10/09/15 0248 10/16/15 1304   10/09/15 0030  vancomycin (VANCOCIN) IVPB 1000 mg/200 mL premix     1,000 mg 200 mL/hr over 60 Minutes Intravenous  Once 10/09/15 0016 10/09/15 0216   10/09/15 0030  piperacillin-tazobactam (ZOSYN) IVPB 2.25 g     2.25 g 100 mL/hr over 30 Minutes Intravenous  Once 10/09/15 0016 10/10/15 0177      Family Communication: Discussed with patient's daughter, all questions were answered satisfactorily.  Disposition:  Expected discharge date: 10/20/2015 Barriers to safe discharge: Improvement in respiratory status   Intake/Output Summary (Last 24 hours) at 10/18/15 1803 Last data filed at 10/18/15 1412  Gross per 24 hour  Intake    600 ml  Output   1250 ml  Net   -650 ml   Filed Weights   10/16/15 0356 10/17/15 0315 10/18/15 0539  Weight: 53.3 kg (117 lb 8.1 oz) 51.6 kg (113 lb 12.1 oz) 51.483 kg (113 lb 8 oz)    Objective: Physical Exam: Filed Vitals:   10/18/15 0813 10/18/15 1017 10/18/15 1257 10/18/15 1422  BP:  129/58 105/55   Pulse:  82 65   Temp:  98.3 F (36.8 C) 98.3 F (36.8 C)   TempSrc:  Oral Axillary   Resp:   18   Height:      Weight:      SpO2: 97% 100% 97% 85%    General: Appear in moderate distress, no Rash; Oral Mucosa moist. Cardiovascular: S1 and S2 Present, no Murmur, difficult to  assess JVD Respiratory: Bilateral Air entry present and bilateral Crackles, no wheezes Abdomen: Bowel Sound present, Soft and no tenderness Extremities: no Pedal edema, no calf tenderness  Data Reviewed: CBC:  Recent Labs Lab 10/14/15 0300 10/15/15 0649 10/16/15 0530 10/17/15 0342 10/18/15 0440  WBC 16.0* 18.1* 13.9* 8.5 8.1  NEUTROABS  --   --  10.4*  --   --   HGB 10.6* 12.0* 10.5* 9.7* 10.1*  HCT 34.0* 39.3 32.8* 32.2* 33.0*  MCV 94.4 94.2 92.1 94.4 94.8  PLT 377 491* 441* 439* 194*   Basic Metabolic Panel:  Recent Labs Lab 10/12/15 0335 10/13/15 0724 10/14/15 0300 10/15/15 0649 10/16/15 0530 10/17/15 0342 10/18/15 0440  NA 138 138 141 145 143 145 143  K 3.9 3.7 4.3 3.2* 2.9* 3.6 4.0  CL 99* 100* 99* 95* 95* 99* 97*  CO2 31 30 34* 42* 42* 41* 40*  GLUCOSE 162* 134* 116* 98 120* 97 92  BUN 21* 21* '19 16 11 6 '$ <5*  CREATININE 0.76 0.67 0.69 0.88 0.77 0.67 0.73  CALCIUM 8.6* 8.3* 8.8* 8.7* 8.3* 8.5* 8.6*  MG 1.8 2.2 2.1 2.0  --  1.9  --   PHOS  --  2.9 2.4* 4.0  --   --   --    Liver Function Tests: No results for input(s): AST, ALT, ALKPHOS, BILITOT, PROT, ALBUMIN in the last 168 hours. No results for input(s): LIPASE, AMYLASE in the last 168 hours. No results for input(s): AMMONIA in the last 168 hours.  Cardiac Enzymes: No results for input(s): CKTOTAL, CKMB, CKMBINDEX, TROPONINI in the last 168 hours.  BNP (last 3 results)  Recent Labs  09/23/15 1842  BNP 143.0*    CBG:  Recent Labs Lab 10/16/15 1555 10/16/15 1930 10/16/15 2326 10/17/15 0313 10/17/15 0826  GLUCAP 99 80 78 91 88    Recent Results (from the past 240 hour(s))  Blood culture (routine x 2)     Status: None   Collection Time: 10/08/15 10:20 PM  Result Value Ref Range Status   Specimen Description BLOOD LEFT WRIST  Final   Special Requests BOTTLES DRAWN AEROBIC ONLY 4CC  Final   Culture NO GROWTH 5 DAYS  Final   Report Status 10/14/2015 FINAL  Final  Blood culture (routine x  2)     Status: None   Collection Time: 10/08/15 10:54 PM  Result Value Ref Range Status   Specimen Description BLOOD RIGHT WRIST  Final   Special Requests BOTTLES DRAWN AEROBIC ONLY 5CC  Final   Culture NO GROWTH 5 DAYS  Final   Report Status 10/13/2015 FINAL  Final  MRSA PCR Screening     Status: None   Collection Time: 10/09/15  3:19  AM  Result Value Ref Range Status   MRSA by PCR NEGATIVE NEGATIVE Final    Comment:        The GeneXpert MRSA Assay (FDA approved for NASAL specimens only), is one component of a comprehensive MRSA colonization surveillance program. It is not intended to diagnose MRSA infection nor to guide or monitor treatment for MRSA infections.   Culture, respiratory (NON-Expectorated)     Status: None   Collection Time: 10/09/15 10:03 AM  Result Value Ref Range Status   Specimen Description TRACHEAL ASPIRATE  Final   Special Requests NONE  Final   Gram Stain   Final    FEW WBC PRESENT,BOTH PMN AND MONONUCLEAR RARE SQUAMOUS EPITHELIAL CELLS PRESENT NO ORGANISMS SEEN Performed at [facility]    Culture   Final    FEW CANDIDA ALBICANS Performed at [facility]    Report Status 10/11/2015 FINAL  Final     Studies: No results found.   Scheduled Meds: . antiseptic oral rinse  7 mL Mouth Rinse q12n4p  . atorvastatin  20 mg Oral QPM  . chlorhexidine  15 mL Mouth Rinse BID  . escitalopram  10 mg Oral Daily  . heparin subcutaneous  5,000 Units Subcutaneous 3 times per day  . ipratropium-albuterol  3 mL Nebulization Q6H  . levofloxacin  500 mg Oral q1800  . metoprolol tartrate  12.5 mg Oral BID  . pantoprazole  40 mg Oral Daily  . predniSONE  20 mg Oral Q breakfast  . QUEtiapine  50 mg Oral q morning - 10a  . risperiDONE  0.5 mg Oral BID  . senna  1 tablet Oral BID  . thiamine  100 mg Oral Daily   Continuous Infusions:   PRN Meds: acetaminophen (TYLENOL) oral liquid 160 mg/5 mL, albuterol, fentaNYL (SUBLIMAZE) injection,  LORazepam, oxyCODONE-acetaminophen, simethicone  Time spent: 30 minutes  Author: Berle Mull, MD [facility] Pager: [phone] 10/18/2015 6:03 PM  If 7PM-7AM, please contact night-coverage at [email or web address], password [facility]

## 2015-10-18 NOTE — Progress Notes (Signed)
Physical Therapy Treatment Patient Details Name: Zachary Walls MRN: [id number] DOB: 1956-07-07 Today's Date: 10/18/2015    History of Present Illness Zachary Walls is a 59 y.o. male with COPD, hypertension, hyperlipidemia, GERD, tobacco abuse, alcohol abuse, coronary artery disease, peripheral vascular disease, history of cardiac arrest, bowel resection with colostomy who presented with respiratory failure due to L HCAP superimposed on COPD.   Was at [facility] SNF PTA.     PT Comments    Goals updated today as pt has met most of them. Pt was able to walk with RW a good distance down the hallway.  DOE 2/4 and O2 sats in the mid 80s on 3 L O2 [name] during gait.  Pt even with seated rest break took quite a long time to recover his O2 back into the 90s >5 mins.  PT will continue to follow acutely and continue to recommend return to SNF at discharge.   Follow Up Recommendations  SNF     Equipment Recommendations  Rolling walker with 5" wheels    Recommendations for Other Services   NA     Precautions / Restrictions Precautions Precautions: Fall Precaution Comments: Monitor O2    Mobility  Bed Mobility Overal bed mobility: Needs Assistance Bed Mobility: Supine to Sit     Supine to sit: Min assist     General bed mobility comments: Min assist to help progress bil legs over EOB.   Transfers Overall transfer level: Needs assistance Equipment used: Rolling walker (2 wheeled) Transfers: Sit to/from Stand Sit to Stand: Min assist         General transfer comment: Min assist to support trunk during transitions for balance.   Ambulation/Gait Ambulation/Gait assistance: Min assist Ambulation Distance (Feet): 120 Feet Assistive device: Rolling walker (2 wheeled) Gait Pattern/deviations: Step-through pattern;Shuffle;Trunk flexed Gait velocity: decreased Gait velocity interpretation: Below normal speed for age/gender General Gait Details: Pt with slow, shuffling gait.  Very  deconditioned, limited by weakness and 2/4 DOE with O2 sats dropping into the mid 80s even with 3 L O2 [name] on during gait.  Pt did not recover O2 quickly with seated rest break and pursed lip breathing.           Balance Overall balance assessment: Needs assistance Sitting-balance support: Feet supported;No upper extremity supported Sitting balance-Leahy Scale: Fair     Standing balance support: Bilateral upper extremity supported;No upper extremity supported;Single extremity supported Standing balance-Leahy Scale: Fair                      Cognition Arousal/Alertness: Awake/alert Behavior During Therapy: Agitated Overall Cognitive Status: No family/caregiver present to determine baseline cognitive functioning                             Pertinent Vitals/Pain Pain Assessment: No/denies pain           PT Goals (current goals can now be found in the care plan section) Acute Rehab PT Goals Patient Stated Goal: none stated PT Goal Formulation: With patient Time For Goal Achievement: 11/01/15 Potential to Achieve Goals: Good Progress towards PT goals: Progressing toward goals;Goals met and updated - see care plan    Frequency  Min 2X/week    PT Plan Current plan remains appropriate       End of Session Equipment Utilized During Treatment: Gait belt;Oxygen Activity Tolerance: Patient limited by fatigue Patient left: in chair;with call bell/phone within reach;Other (  comment) (RN tech to get chair alarm)     Time: [phone] PT Time Calculation (min) (ACUTE ONLY): 30 min  Charges:  $Gait Training: 8-22 mins , 1 re-eval                     Zachary Walls, PT, DPT [phone]   10/18/2015, 3:19 PM

## 2015-10-19 DIAGNOSIS — Z66 Do not resuscitate: Secondary | ICD-10-CM

## 2015-10-19 LAB — CBC
HEMATOCRIT: 33.8 % — AB (ref 39.0–52.0)
HEMOGLOBIN: 10 g/dL — AB (ref 13.0–17.0)
MCH: 28.5 pg (ref 26.0–34.0)
MCHC: 29.6 g/dL — AB (ref 30.0–36.0)
MCV: 96.3 fL (ref 78.0–100.0)
Platelets: 482 10*3/uL — ABNORMAL HIGH (ref 150–400)
RBC: 3.51 MIL/uL — ABNORMAL LOW (ref 4.22–5.81)
RDW: 14.7 % (ref 11.5–15.5)
WBC: 8 10*3/uL (ref 4.0–10.5)

## 2015-10-19 LAB — BASIC METABOLIC PANEL
Anion gap: 7 (ref 5–15)
BUN: 5 mg/dL — AB (ref 6–20)
CHLORIDE: 95 mmol/L — AB (ref 101–111)
CO2: 40 mmol/L — AB (ref 22–32)
CREATININE: 0.71 mg/dL (ref 0.61–1.24)
Calcium: 8.7 mg/dL — ABNORMAL LOW (ref 8.9–10.3)
GFR calc Af Amer: >60 mL/min (ref >60)
GFR calc non Af Amer: >60 mL/min (ref >60)
Glucose, Bld: 90 mg/dL (ref 65–99)
Potassium: 4 mmol/L (ref 3.5–5.1)
Sodium: 142 mmol/L (ref 135–145)

## 2015-10-19 LAB — URINALYSIS W MICROSCOPIC (NOT AT ARMC)
BILIRUBIN URINE: NEGATIVE
Glucose, UA: NEGATIVE mg/dL
KETONES UR: NEGATIVE mg/dL
LEUKOCYTES UA: NEGATIVE
NITRITE: NEGATIVE
PROTEIN: NEGATIVE mg/dL
SPECIFIC GRAVITY, URINE: 1.012 (ref 1.005–1.030)
pH: 8 (ref 5.0–8.0)

## 2015-10-19 MED ORDER — IPRATROPIUM-ALBUTEROL 0.5-2.5 (3) MG/3ML IN SOLN
RESPIRATORY_TRACT | Status: AC
  Administered 2015-10-19: 3 mL via RESPIRATORY_TRACT
  Filled 2015-10-19: qty 3

## 2015-10-19 NOTE — Care Management Note (Addendum)
Case Management Note  Patient Details  Name: LYSLE SMOLINSKI MRN: [id number] Date of Birth: 26-Jan-1956  Subjective/Objective:       Pt admitted with severe sepsis             16:22 Action Plan Update:  CSW spoke with pt and pt stated he wanted to go home. CSW and Attending contacted daughter in law Sharyn Lull regarding pts wishes, daughter in law informed both; she cooks all meals and she feels comfortable with pt going home without 24 hour supervision.  Attending feels that pt could safely discharge home with home hospice giving plan communicated by daughter in law,  CM contacted Sharyn Lull [phone], [phone], [phone] on each number provided on face sheet to provide Home Hospice Choice and left voicemail regarding the need to chose/gain acceptance from chosen agency for service.  CM will continue to monitor for disposition needs  Action/Plan: Pt is from [facility]. Pt will discharge to SNF once medically stable   Expected Discharge Date:                  Expected Discharge Plan:  [place]  In-House Referral:  Clinical Social Work  Discharge planning Services  CM Consult  Post Acute Care Choice:    Choice offered to:     DME Arranged:    DME Agency:     HH Arranged:    [place] Agency:     Status of Service:  In process, will continue to follow  Medicare Important Message Given:    Date Medicare IM Given:    Medicare IM give by:    Date Additional Medicare IM Given:    Additional Medicare Important Message give by:     If discussed at [place] of Stay Meetings, dates discussed:    Additional Comments: 1745 CM was unable to reach [place].  CM attempted to offer choice for home hospice list to pt, pt did not appear to comprehend the request.  CM informed pt that she had been unable to reach [place] by phone, CM asked pt if Sharyn Lull would visit him this evening, pt responded "she is supposed to".  CM told pt that she was going to leave the list and asked him to give/discuss the  list with Sharyn Lull, pt simply stated "ok".  CM spoke with bedside nurse post assessment; bedside nurse voiced safety concerns with pt discharging home without 24 hour care/supervision.  CM contacted attending and shared concerns and assessment, MD acknowledged and will reassess pts competency to make decisions along with support system acceptability for pt to discharge home in current situation with family tomorrow 10/20/15 morning.  CM also communicated safety concern with pt discharging home with CSW.  Maryclare Labrador, RN,  10/19/2015, 2:57 PM

## 2015-10-19 NOTE — Progress Notes (Addendum)
[facility] Progress Note    Patient: Zachary Walls    GLO:[id number]  DOB: 27-Mar-1956     DOA: 10/08/2015  Brief Summary of Hospitalization:  HPI: As per the H and P dictated on admission, "Zachary Walls is a 59 y.o. male with COPD, hypertension, hyperlipidemia, GERD, tobacco abuse, alcohol abuse, coronary artery disease, peripheral vascular disease, history of cardiac arrest, bowel resection with colostomy who presented with respiratory failure.  Per reports from the ED staff, he was found unresponsive in his SNF with a GCS of 3 but maintaining a pulse. He was intubated in the field and brought to the [facility] ED. He was found to have hypoxemia (SpO2 50%s) on the ventilator and collapse of the left upper lobe on CXR. The ED performed a replacement of his ETT with a 7.70F tube. He remained very hypoxic with PaO2 in the 50s despite FiO2 100%, PEEP 5. His lactate was 8, BP low and hypothermic on arrival which improved after fluid resuscitation, antibiotics and active external warming. MICU was called then for admission. His mental status also recovered to him making eye contact and flailing all extremities for which he was given boluses of fentanyl and versed.   Assessment and Plan: 1. Severe sepsis ([place])  Healthcare associated pneumonia Partial collapse of the left lung from mucous plug Acute on chronic hypoxic respiratory failure with chronic COPD and hypercapnia  presented with episode of unresponsiveness requiring intubation in the field, patient was never pulseless. Lactic acid was 8, received O2 by exchange in the ER on arrival. X-ray showed left upper lung partial collapse probably mucous blood. Met criteria for sepsis with leukocytosis, lactic acidosis, hypotension and hypothermia. Required IV resuscitation followed by phenylephrine drip, fentanyl drip as well as Precedex Extubated on 10/13/2015 Blood culture negative for 5 days Tracheal aspirate shows Candida albicans but no  bacterial infection. On Levaquin Continue prednisone 20 mg by mouth, duo nebs.  Continue monitoring on telemetry today  2. Dysphagia Severe protein calorie malnutrition Tolerating PO food  3. Acute encephalopathy resolved  4. Leukocytosis Resolved. Will continue monitoring.  5. Mood disorder. Patient is on Lexapro, Seroquel, risperidone at home. We will continue.  6. Hypokalemia. Resolved   7. Goals of care discussion Patient was intubated on arrival. Earlier discussion with the power of attorney patient's daughter, by critical care, patient is a DO NOT RESUSCITATE. Appreciate palliative care to help in discussion with the family about deciding goals of care.  progressing to end-stage COPD with poor long-term prognosis. It was discussed with the family that should the patient has intubation it would be difficult to extubate the patient again. And patient and the family has decided to go for DO NOT RESUSCITATE. Further it was discussed about focusing on comfort and staying out of the hospital. Patient wants to get better with the treatment. Family is currently discussing among themselves about further plan of care. At present as per the discussion in the presence of palliative care, most likely Is to Go for [facility] with palliative care -do not think patient has capacity  DVT Prophylaxis: subcutaneous Heparin  Advance goals of care discussion: DNR/DNI as per discussion by the earlier team critical care   Daily update, Procedures: Intubation on admission 10/09/2015 Extubation 10/13/2015 Transferred to step down 10/14/2015, 10/17/2015 passed swallowing evaluation, transferring out of the stepdown unit Consultants: Primary team critical care service, palliative care medicine Antibiotics: Anti-infectives    Start     Dose/Rate Route Frequency Ordered  Stop   10/18/15 1800  levofloxacin (LEVAQUIN) tablet 500 mg     500 mg Oral Daily-1800 10/18/15 1341 10/19/15  2359   10/16/15 1600  levofloxacin (LEVAQUIN) IVPB 750 mg  Status:  Discontinued     750 mg 100 mL/hr over 90 Minutes Intravenous Every 24 hours 10/16/15 1529 10/18/15 1341   10/16/15 1400  levofloxacin (LEVAQUIN) tablet 750 mg  Status:  Discontinued     750 mg Oral Daily 10/16/15 1309 10/16/15 1529   10/12/15 1330  vancomycin (VANCOCIN) IVPB 1000 mg/200 mL premix  Status:  Discontinued     1,000 mg 200 mL/hr over 60 Minutes Intravenous Every 12 hours 10/12/15 1321 10/16/15 1304   10/10/15 1200  vancomycin (VANCOCIN) IVPB 750 mg/150 ml premix  Status:  Discontinued     750 mg 150 mL/hr over 60 Minutes Intravenous Every 12 hours 10/10/15 0852 10/12/15 1321   10/10/15 0000  vancomycin (VANCOCIN) IVPB 750 mg/150 ml premix  Status:  Discontinued     750 mg 150 mL/hr over 60 Minutes Intravenous Every 24 hours 10/09/15 0248 10/09/15 1004   10/10/15 0000  vancomycin (VANCOCIN) IVPB 1000 mg/200 mL premix  Status:  Discontinued     1,000 mg 200 mL/hr over 60 Minutes Intravenous Every 24 hours 10/09/15 1004 10/10/15 0851   10/09/15 0800  piperacillin-tazobactam (ZOSYN) IVPB 3.375 g  Status:  Discontinued     3.375 g 12.5 mL/hr over 240 Minutes Intravenous 3 times per day 10/09/15 0248 10/16/15 1304   10/09/15 0030  vancomycin (VANCOCIN) IVPB 1000 mg/200 mL premix     1,000 mg 200 mL/hr over 60 Minutes Intravenous  Once 10/09/15 0016 10/09/15 0216   10/09/15 0030  piperacillin-tazobactam (ZOSYN) IVPB 2.25 g     2.25 g 100 mL/hr over 30 Minutes Intravenous  Once 10/09/15 0016 10/10/15 6948      Family Communication: able to reach daughter in law--- plan to go home with hospice and family support  Disposition:  Expected discharge date: 10/20/2015 home with hospice    Intake/Output Summary (Last 24 hours) at 10/19/15 1501 Last data filed at 10/19/15 0850  Gross per 24 hour  Intake    240 ml  Output    920 ml  Net   -680 ml   Filed Weights   10/17/15 0315 10/18/15 0539 10/19/15 0424    Weight: 51.6 kg (113 lb 12.1 oz) 51.483 kg (113 lb 8 oz) 50.395 kg (111 lb 1.6 oz)    Objective: Physical Exam: Filed Vitals:   10/19/15 0207 10/19/15 0424 10/19/15 0935 10/19/15 1044  BP:  116/59  138/71  Pulse:  65  85  Temp:  97.7 F (36.5 C)    TempSrc:  Oral    Resp:  18    Height:      Weight:  50.395 kg (111 lb 1.6 oz)    SpO2: 100% 96% 95%     General: hard of hearing Cardiovascular: S1 and S2 Present, no Murmur, difficult to assess JVD Respiratory: Bilateral Air entry present and bilateral Crackles, no wheezes Abdomen: Bowel Sound present, Soft and no tenderness Extremities: no Pedal edema, no calf tenderness  Data Reviewed: CBC:  Recent Labs Lab 10/15/15 0649 10/16/15 0530 10/17/15 0342 10/18/15 0440 10/19/15 0440  WBC 18.1* 13.9* 8.5 8.1 8.0  NEUTROABS  --  10.4*  --   --   --   HGB 12.0* 10.5* 9.7* 10.1* 10.0*  HCT 39.3 32.8* 32.2* 33.0* 33.8*  MCV 94.2 92.1 94.4 94.8  96.3  PLT 491* 441* 439* 466* 496*   Basic Metabolic Panel:  Recent Labs Lab 10/13/15 0724 10/14/15 0300 10/15/15 0649 10/16/15 0530 10/17/15 0342 10/18/15 0440 10/19/15 0440  NA 138 141 145 143 145 143 142  K 3.7 4.3 3.2* 2.9* 3.6 4.0 4.0  CL 100* 99* 95* 95* 99* 97* 95*  CO2 30 34* 42* 42* 41* 40* 40*  GLUCOSE 134* 116* 98 120* 97 92 90  BUN 21* '19 16 11 6 '$ <5* 5*  CREATININE 0.67 0.69 0.88 0.77 0.67 0.73 0.71  CALCIUM 8.3* 8.8* 8.7* 8.3* 8.5* 8.6* 8.7*  MG 2.2 2.1 2.0  --  1.9  --   --   PHOS 2.9 2.4* 4.0  --   --   --   --    Liver Function Tests: No results for input(s): AST, ALT, ALKPHOS, BILITOT, PROT, ALBUMIN in the last 168 hours. No results for input(s): LIPASE, AMYLASE in the last 168 hours. No results for input(s): AMMONIA in the last 168 hours.  Cardiac Enzymes: No results for input(s): CKTOTAL, CKMB, CKMBINDEX, TROPONINI in the last 168 hours.  BNP (last 3 results)  Recent Labs  09/23/15 1842  BNP 143.0*    CBG:  Recent Labs Lab 10/16/15 1555  10/16/15 1930 10/16/15 2326 10/17/15 0313 10/17/15 0826  GLUCAP 99 80 78 91 88    No results found for this or any previous visit (from the past 240 hour(s)).   Studies: No results found.   Scheduled Meds: . antiseptic oral rinse  7 mL Mouth Rinse q12n4p  . atorvastatin  20 mg Oral QPM  . chlorhexidine  15 mL Mouth Rinse BID  . escitalopram  10 mg Oral Daily  . heparin subcutaneous  5,000 Units Subcutaneous 3 times per day  . ipratropium-albuterol  3 mL Nebulization Q6H  . levofloxacin  500 mg Oral q1800  . metoprolol tartrate  12.5 mg Oral BID  . pantoprazole  40 mg Oral Daily  . predniSONE  20 mg Oral Q breakfast  . QUEtiapine  50 mg Oral q morning - 10a  . risperiDONE  0.5 mg Oral BID  . senna  1 tablet Oral BID  . thiamine  100 mg Oral Daily   Continuous Infusions:   PRN Meds: acetaminophen (TYLENOL) oral liquid 160 mg/5 mL, albuterol, fentaNYL (SUBLIMAZE) injection, LORazepam, oxyCODONE-acetaminophen, simethicone  Time spent: 30 minutes  Eulogio Bear DO [facility] Pager: [phone] 10/19/2015 3:01 PM  If 7PM-7AM, please contact night-coverage at [email or web address], password [facility]

## 2015-10-19 NOTE — Progress Notes (Addendum)
4:30pm Pt has no other bed offers- CSW updated pt  Pt is now saying he wants to return home- understands that he has poor prognosis and states he only has 6 months to live according to the doctors- he would like to spend those months at home  CSW had spoken to [place] (pt dtr in law) concerning plan- Sharyn Lull is agreeable to pt return to [facility] but is also agreeable to what pt is wanting- states that if he were to return home someone could check in before and after work  [place] updated MD and RNCM on pt wishes and on family communication  CSW will continue to follow and assist in final disposition  8:30am CSW spoke with pt- he would like different short term SNF  CSW initiated referral process- awaiting bed offers  Domenica Reamer, [place] Worker [phone]

## 2015-10-19 NOTE — Progress Notes (Signed)
Speech Language Pathology Treatment: Dysphagia  Patient Details Name: Zachary Walls MRN: [id number] DOB: January 24, 1956 Today's Date: 10/19/2015 Time: 9450-3888 SLP Time Calculation (min) (ACUTE ONLY): 22 min  Assessment / Plan / Recommendation Clinical Impression  Pt consumed regular consistency and thin liquids without overt aspiration noted; he did exhibit delayed throat clearing during the last session, so cup sips were utilized successively without delayed throat clearing noted; discussed not using a straw to decrease the risk for aspiration as well and pt in agreement d/t respiratory status at present and prognosis given by MD.  ST to s/o at this time as pt is tolerating regular/thin and medications without apparent difficulty.   HPI HPI: Zachary Walls is a 59 y.o. male with COPD, hypertension, hyperlipidemia, GERD, tobacco abuse, alcohol abuse, coronary artery disease, peripheral vascular disease, history of cardiac arrest, bowel resection with colostomy who presented with respiratory failure due to L HCAP superimposed on COPD.       SLP Plan  All goals met     Recommendations  Diet recommendations: Regular;Thin liquid Liquids provided via: Cup Medication Administration: Whole meds with liquid Supervision: Patient able to self feed Compensations: Slow rate;Small sips/bites Postural Changes and/or Swallow Maneuvers: Seated upright 90 degrees              Oral Care Recommendations: Oral care BID Follow up Recommendations: None Plan: All goals met   Gleason Ardoin,PAT, M.S., CCC-SLP 10/19/2015, 11:03 AM

## 2015-10-20 MED ORDER — QUETIAPINE FUMARATE 50 MG PO TABS: 50.0000 mg | ORAL_TABLET | Freq: Every morning | ORAL | Status: AC

## 2015-10-20 MED ORDER — PREDNISONE 10 MG PO TABS: ORAL_TABLET | ORAL | Status: AC

## 2015-10-20 MED ORDER — TIOTROPIUM BROMIDE MONOHYDRATE 18 MCG IN CAPS: 18.0000 ug | ORAL_CAPSULE | Freq: Every day | RESPIRATORY_TRACT | Status: AC

## 2015-10-20 MED ORDER — METOPROLOL TARTRATE 25 MG PO TABS: 12.5000 mg | ORAL_TABLET | Freq: Two times a day (BID) | ORAL | Status: AC

## 2015-10-20 MED ORDER — ALBUTEROL SULFATE HFA 108 (90 BASE) MCG/ACT IN AERS: 2.0000 | INHALATION_SPRAY | Freq: Four times a day (QID) | RESPIRATORY_TRACT | Status: AC | PRN

## 2015-10-20 MED ORDER — IPRATROPIUM-ALBUTEROL 0.5-2.5 (3) MG/3ML IN SOLN: 3.0000 mL | Freq: Four times a day (QID) | RESPIRATORY_TRACT | Status: AC

## 2015-10-20 MED ORDER — ALBUTEROL SULFATE (2.5 MG/3ML) 0.083% IN NEBU: 2.5000 mg | INHALATION_SOLUTION | RESPIRATORY_TRACT | Status: AC | PRN

## 2015-10-20 MED ORDER — RISPERIDONE 0.5 MG PO TABS: 0.5000 mg | ORAL_TABLET | Freq: Two times a day (BID) | ORAL | Status: AC

## 2015-10-20 NOTE — Progress Notes (Signed)
Pt discharge education and instructions completed with pt and daughter at bedside; both voices understanding and denies any questions. Pt IV and telemetry removed; colostomy emptied prior to dc; pt daughter handed the prescriptions for albuterol, duoneb, metoprolol, prednisone, Seroquel, risperidone and Spiriva. Pt discharge home with daughter to transport him home. Pt transported off unit via wheelchair with daughter and belongings to the side. Francis Gaines Mohan Erven RN.

## 2015-10-20 NOTE — Discharge Summary (Signed)
Physician Discharge Summary  Zachary Walls IOX:[id number] DOB: 1955/12/26 DOA: 10/08/2015  PCP: [name], NP  Admit date: 10/08/2015 Discharge date: 10/20/2015  Time spent: 35 minutes  Recommendations for Outpatient Follow-up:  1. Home hospice 2. DNR 3. Supervision by family 4. O2   Discharge Diagnoses:  Principal Problem:   Severe sepsis ([place]) Active Problems:   COPD (chronic obstructive pulmonary disease) (HCC)   Hypertension   HOH (hard of hearing)   Severe protein-calorie malnutrition (HCC)   Respiratory failure, acute and chronic (HCC)   Respiratory failure, acute ([place])   Dependence on ventilator, status ([place])   History of ETT   Pneumonia   Dysphagia   Encounter for nasogastric (NG) tube placement   On tube feeding diet   Palliative care encounter   Discharge Condition: terminal  Diet recommendation: cardiac  Filed Weights   10/18/15 0539 10/19/15 0424 10/20/15 0739  Weight: 51.483 kg (113 lb 8 oz) 50.395 kg (111 lb 1.6 oz) 49.805 kg (109 lb 12.8 oz)    History of present illness:  Zachary Walls is a 58 y.o. male with COPD, hypertension, hyperlipidemia, GERD, tobacco abuse, alcohol abuse, coronary artery disease, peripheral vascular disease, history of cardiac arrest, bowel resection with colostomy who presented with respiratory failure.  Per reports from the ED staff, he was found unresponsive in his SNF with a GCS of 3 but maintaining a pulse. He was intubated in the field and brought to the [facility] ED. He was found to have hypoxemia (SpO2 50%s) on the ventilator and collapse of the left upper lobe on CXR. The ED performed a replacement of his ETT with a 7.41F tube. He remained very hypoxic with PaO2 in the 50s despite FiO2 100%, PEEP 5. His lactate was 8, BP low and hypothermic on arrival which improved after fluid resuscitation, antibiotics and active external warming. MICU was called then for admission. His mental status also recovered to him making  eye contact and flailing all extremities for which he was given boluses of fentanyl and versed.  No further history was obtainable as the patient is intubated and sedated. His family was attempted to be reached on multiple phone numbers which went to disconnected lines or un-identified voice [place] [facility] Course:  Severe sepsis [facility])  Healthcare associated pneumonia Partial collapse of the left lung from mucous plug Acute on chronic hypoxic respiratory failure with chronic COPD and hypercapnia  presented with episode of unresponsiveness requiring intubation in the field, patient was never pulseless. Lactic acid was 8, received O2 by exchange in the ER on arrival. X-ray showed left upper lung partial collapse probably mucous blood. Met criteria for sepsis with leukocytosis, lactic acidosis, hypotension and hypothermia. Required IV resuscitation followed by phenylephrine drip, fentanyl drip as well as Precedex Extubated on 10/13/2015 Blood culture negative for 5 days Tracheal aspirate shows Candida albicans but no bacterial infection. On Levaquin Continue prednisone 20 mg by mouth, duo nebs.  Continue monitoring on telemetry today   Dysphagia Severe protein calorie malnutrition Tolerating PO food   Acute encephalopathy resolved  Leukocytosis Resolved. Will continue monitoring.   Mood disorder. Patient is on Lexapro, Seroquel, risperidone at home. We will continue.  Hypokalemia. Resolved    Goals of care discussion Patient was intubated on arrival. Earlier discussion with the power of attorney patient's daughter, by critical care, patient is a DO NOT RESUSCITATE. Appreciate palliative care to help in discussion with the family about deciding goals of care. progressing to end-stage COPD  with poor long-term prognosis. It was discussed with the family that should the patient has intubation it would be difficult to extubate the patient again. And patient and the family has  decided to go for DO NOT RESUSCITATE.  Patient refused SNF and family will take home with hospice  Procedures:  intubated  Consultations:  Palliative care  PCCM  SLP  Discharge Exam: Filed Vitals:   10/20/15 0534 10/20/15 0739  BP: 408/54 126/72  Pulse: 64   Temp: 97.9 F (36.6 C)   Resp: 16     General: awake, NAD- anxious to go home   Discharge Instructions   Discharge Instructions    Diet general    Complete by:  As directed      Discharge instructions    Complete by:  As directed   Home hospice Home O2 Supervision/support by family DNR     Increase activity slowly    Complete by:  As directed           Current Discharge Medication List    START taking these medications   Details  ipratropium-albuterol (DUONEB) 0.5-2.5 (3) MG/3ML SOLN Take 3 mLs by nebulization every 6 (six) hours. Qty: 360 mL, Refills: 1    predniSONE (DELTASONE) 10 MG tablet 10 mg x 3 days and stop Qty: 3 tablet, Refills: 0      CONTINUE these medications which have CHANGED   Details  albuterol (PROVENTIL HFA;VENTOLIN HFA) 108 (90 Base) MCG/ACT inhaler Inhale 2 puffs into the lungs every 6 (six) hours as needed for wheezing or shortness of breath (wheezing). Qty: 1 Inhaler, Refills: 5    albuterol (PROVENTIL) (2.5 MG/3ML) 0.083% nebulizer solution Take 3 mLs (2.5 mg total) by nebulization every 4 (four) hours as needed for wheezing or shortness of breath. Qty: 30 vial, Refills: 0    metoprolol tartrate (LOPRESSOR) 25 MG tablet Take 0.5 tablets (12.5 mg total) by mouth 2 (two) times daily. Qty: 60 tablet, Refills: 0    QUEtiapine (SEROQUEL) 50 MG tablet Take 1 tablet (50 mg total) by mouth every morning. Qty: 30 tablet, Refills: 0    risperiDONE (RISPERDAL) 0.5 MG tablet Take 1 tablet (0.5 mg total) by mouth 2 (two) times daily. Qty: 60 tablet, Refills: 1    tiotropium (SPIRIVA) 18 MCG inhalation capsule Place 1 capsule (18 mcg total) into inhaler and inhale daily. Qty:  30 capsule, Refills: 0      CONTINUE these medications which have NOT CHANGED   Details  atorvastatin (LIPITOR) 20 MG tablet Take 1 tablet (20 mg total) by mouth every evening. Qty: 30 tablet, Refills: 0    escitalopram (LEXAPRO) 10 MG tablet Take 1 tablet (10 mg total) by mouth daily. Qty: 30 tablet, Refills: 1    omeprazole (PRILOSEC) 20 MG capsule Take 1 capsule (20 mg total) by mouth daily. Qty: 30 capsule, Refills: 2    thiamine (VITAMIN B-1) 100 MG tablet Take 100 mg by mouth daily.    oxyCODONE-acetaminophen (PERCOCET) 5-325 MG tablet Take 1 tablet by mouth every 8 (eight) hours as needed for severe pain. Qty: 30 tablet, Refills: 0      STOP taking these medications     diazepam (VALIUM) 5 MG tablet      docusate sodium 100 MG CAPS      feeding supplement, ENSURE ENLIVE, (ENSURE ENLIVE) LIQD      guaiFENesin (MUCINEX) 600 MG 12 hr tablet      losartan (COZAAR) 50 MG tablet  nitroGLYCERIN (NITROSTAT) 0.4 MG SL tablet        Allergies  Allergen Reactions  . Bee Venom Shortness Of Breath and Swelling  . Shellfish Allergy Anaphylaxis  . Codeine Itching and Rash   Follow-up Information    Follow up with Hospice at [facility].   Specialty:  Hospice and Palliative Medicine   Why:  Home Hospice arranged   Contact information:   [place] [address] [phone]        The results of significant diagnostics from this hospitalization (including imaging, microbiology, ancillary and laboratory) are listed below for reference.    Significant Diagnostic Studies: Dg Chest 2 View  09/23/2015  CLINICAL DATA:  Fall with near syncopal episode today. Multiple recent falls. Initial encounter. EXAM: CHEST  2 VIEW COMPARISON:  07/12/2015 and 12/08/2014 radiographs.  CT 01/29/2014. FINDINGS: The heart size and mediastinal contours are stable. The lungs are hyperinflated. There is new irregular density at the right apex which is not typical for acute  inflammation and may reflect postinflammatory scarring or a developing mass. There are multiple old right-sided rib and clavicle fractures. No acute fractures, significant pleural effusion or pneumothorax demonstrated. IMPRESSION: 1. New right apical density could reflect post inflammatory scarring, although a developing mass cannot be excluded. Further evaluation with chest CT recommended to exclude neoplasm. 2. No other significant changes identified. Chronic obstructive lung disease with old right-sided rib and clavicle fractures. Electronically Signed   By: Richardean Sale M.D.   On: 09/23/2015 18:36   Ct Head Wo Contrast  09/23/2015  CLINICAL DATA:  Fall, near syncope. EXAM: CT HEAD WITHOUT CONTRAST CT CERVICAL SPINE WITHOUT CONTRAST TECHNIQUE: Multidetector CT imaging of the head and cervical spine was performed following the standard protocol without intravenous contrast. Multiplanar CT image reconstructions of the cervical spine were also generated. COMPARISON:  Head CT dated 11/22/2014. FINDINGS: CT HEAD FINDINGS Ventricles remain normal in size and configuration. All areas of the brain demonstrate grossly normal gray-white matter attenuation. There is no mass, hemorrhage, edema, or other evidence of acute parenchymal abnormality. No extra-axial hemorrhage. No osseous abnormality. Paranasal sinuses and mastoid air cells are clear. Superficial soft tissues are unremarkable. CT CERVICAL SPINE FINDINGS Minimal degenerative change noted within the cervical spine. Osseous alignment is normal. No fracture line or displaced fracture fragment identified. Posterior elements appear intact and well aligned throughout. Paravertebral soft tissues are unremarkable. Large lucency at the right lung apex is presumably severe bullous change related to underlying emphysema. Additional milder emphysematous change within the left lung apex. IMPRESSION: 1. Normal head CT. No intracranial mass, hemorrhage, or edema. No  fracture. 2. No fracture or dislocation within the cervical spine. 3. Large area of hyperlucency at the right lung apex, incompletely imaged at the lower aspects of the cervical spine CT. This is most likely severe bullous change related to underlying emphysema but would consider chest x-ray to exclude the less likely possibility of pneumothorax. Electronically Signed   By: Franki Cabot M.D.   On: 09/23/2015 19:53   Ct Chest Wo Contrast  09/23/2015  CLINICAL DATA:  Syncope, shortness of breath, cough EXAM: CT CHEST WITHOUT CONTRAST TECHNIQUE: Multidetector CT imaging of the chest was performed following the standard protocol without IV contrast. COMPARISON:  CT chest 01/29/2014 FINDINGS: The central airways are patent. There is bilateral centrilobular and paraseptal emphysema. There is severe bullous disease at the right lung apex. There is a bandlike area of nodular airspace disease in the right upper lobe corresponding  to the chest x-ray abnormality with a dominant nodular component along the inferior margin measuring 12.5 mm. There is no other pulmonary mass, focal consolidation or nodule. There is no pleural effusion or pneumothorax. There are no pathologically enlarged axillary, hilar or mediastinal lymph nodes. The heart size is normal. There is coronary artery atherosclerosis in the left main and RCA. There is no pericardial effusion. The thoracic aorta is normal in caliber. Review of bone windows demonstrates no focal lytic or sclerotic lesions. There is an old healed right fifth lateral rib fracture. Limited non-contrast images of the upper abdomen were obtained. The adrenal glands appear normal. The remainder of the visualized abdominal organs are unremarkable. IMPRESSION: 1. No acute cardiopulmonary process. 2. Bandlike area of nodular airspace disease in the right upper lobe corresponding to the chest x-ray abnormality. The appearance is likely secondary to post inflammatory scarring but there is a  dominant 12.5 mm nodule along the inferior margin. Recommend follow-up CT in 3 months to ensure stability. 3. Severe bilateral emphysematous changes with bullous disease at the right lung apex. Electronically Signed   By: Kathreen Devoid   On: 09/23/2015 20:01   Ct Cervical Spine Wo Contrast  09/23/2015  CLINICAL DATA:  Fall, near syncope. EXAM: CT HEAD WITHOUT CONTRAST CT CERVICAL SPINE WITHOUT CONTRAST TECHNIQUE: Multidetector CT imaging of the head and cervical spine was performed following the standard protocol without intravenous contrast. Multiplanar CT image reconstructions of the cervical spine were also generated. COMPARISON:  Head CT dated 11/22/2014. FINDINGS: CT HEAD FINDINGS Ventricles remain normal in size and configuration. All areas of the brain demonstrate grossly normal gray-white matter attenuation. There is no mass, hemorrhage, edema, or other evidence of acute parenchymal abnormality. No extra-axial hemorrhage. No osseous abnormality. Paranasal sinuses and mastoid air cells are clear. Superficial soft tissues are unremarkable. CT CERVICAL SPINE FINDINGS Minimal degenerative change noted within the cervical spine. Osseous alignment is normal. No fracture line or displaced fracture fragment identified. Posterior elements appear intact and well aligned throughout. Paravertebral soft tissues are unremarkable. Large lucency at the right lung apex is presumably severe bullous change related to underlying emphysema. Additional milder emphysematous change within the left lung apex. IMPRESSION: 1. Normal head CT. No intracranial mass, hemorrhage, or edema. No fracture. 2. No fracture or dislocation within the cervical spine. 3. Large area of hyperlucency at the right lung apex, incompletely imaged at the lower aspects of the cervical spine CT. This is most likely severe bullous change related to underlying emphysema but would consider chest x-ray to exclude the less likely possibility of pneumothorax.  Electronically Signed   By: Franki Cabot M.D.   On: 09/23/2015 19:53   Dg Chest Port 1 View  10/16/2015  CLINICAL DATA:  Pt diagnosed with pna Hx COPD, pna, HTN, tobacco abuse, CAD, SOB EXAM: PORTABLE CHEST - 1 VIEW COMPARISON:  10/13/2015 FINDINGS: Persistent left lower lung consolidation. Slight improvement in asymmetric perihilar and bibasilar interstitial edema or infiltrates left greater than right. Probable layering left pleural effusion. Heart size normal. Feeding tube has been placed at least as far as the stomach, tip not seen. IMPRESSION: 1. Persistent asymmetric infiltrates/edema left greater than right, with probable layering left effusion. Electronically Signed   By: Lucrezia Europe M.D.   On: 10/16/2015 09:59   Dg Chest Port 1 View  10/13/2015  CLINICAL DATA:  Endotracheal tube EXAM: PORTABLE CHEST 1 VIEW COMPARISON:  Yesterday FINDINGS: Endotracheal tube tip is between the clavicular heads and carina, stable from  prior. The orogastric tube tip at least reaches the stomach. Unchanged pneumonia throughout most of the left lung and at the right base. Probable trace pleural effusions. There is a background of emphysema. Normal heart size and stable mediastinal contours. IMPRESSION: 1. Unremarkable positioning of endotracheal and orogastric tubes. 2. Unchanged bilateral pneumonia. 3. Emphysema. Electronically Signed   By: Monte Fantasia M.D.   On: 10/13/2015 04:55   Dg Chest Port 1 View  10/12/2015  CLINICAL DATA:  Acute respiratory failure, hypoxia, history of COPD EXAM: PORTABLE CHEST 1 VIEW COMPARISON:  Portable chest x-ray of October 11, 2015 FINDINGS: The lungs remain hyperinflated. Confluent alveolar opacity has progressed throughout much of the left lung and in the right infrahilar region. The retrocardiac region on the left remains dense. The left hemidiaphragm is not completely included in the field of view. The heart is normal in size. The pulmonary vascularity is not engorged. The  endotracheal tube tip lies 5.3 cm above the carina. The esophagogastric tube tip projects below the inferior margin of the image. IMPRESSION: Worsening of bilateral alveolar opacities consistent with pneumonia. Underlying COPD is stable. The support tubes are in reasonable position. Electronically Signed   By: David  Martinique M.D.   On: 10/12/2015 07:13   Dg Chest Port 1 View  10/11/2015  CLINICAL DATA:  Acute respiratory failure. EXAM: PORTABLE CHEST 1 VIEW COMPARISON:  10/10/2015. FINDINGS: Endotracheal tube and NG tube in stable position. Stable cardiomegaly. Diffuse left lung infiltrate and/or edema with slight increase in aeration of the left lung from prior exam. Mild infiltrate and/or edema right base. Small left pleural effusion cannot be excluded. No pneumothorax. IMPRESSION: 1. Persistent diffuse left lung infiltrate/edema with slight slight improvement of aeration from prior exam. Small left pleural effusion. 2. Mild infiltrate and/or edema noted in the right lung base. 3. Stable cardiomegaly . Electronically Signed   By: Marcello Moores  Register   On: 10/11/2015 07:18   Dg Chest Port 1 View  10/10/2015  CLINICAL DATA:  Acute respiratory failure, ventilator dependent, history of COPD, tracheostomy patient. EXAM: PORTABLE CHEST 1 VIEW COMPARISON:  Portable chest x-ray of October 09, 2015. FINDINGS: The lungs are well-expanded. There is persistent confluent interstitial and alveolar opacity on the left perhaps slightly improved since yesterday's study. There is left lower lobe atelectasis and small left pleural effusion. On the right minimal increased interstitial markings are present without alveolar opacities. Density in the right upper lobe is stable. The heart and pulmonary vascularity are normal. External pacemaker defibrillator pads are present. The endotracheal tube tip lies approximately 6.3 cm above the carina. The esophagogastric tube tip projects below the inferior margin of the image. IMPRESSION:  Slight interval improvement in the left-sided pneumonia. Stable findings elsewhere. The support tubes are in reasonable position. Electronically Signed   By: David  Martinique M.D.   On: 10/10/2015 07:16   Dg Chest Port 1 View  10/09/2015  CLINICAL DATA:  Ventilator dependence. EXAM: PORTABLE CHEST 1 VIEW COMPARISON:  10/08/2015 FINDINGS: Endotracheal tube with tip measuring 6.5 cm above the carina. Enteric tube tip is off the field of view but below the left hemidiaphragm. There is diffuse infiltration/consolidation throughout the left lung with volume loss. Aeration is somewhat improved since previous study but is still compromised. Hyperinflation of the right lung suggesting diffuse emphysematous change. Focal scarring in the right apex. No pneumothorax. IMPRESSION: Persistent volume loss and consolidation in the left lung although aeration is mildly improved. Electronically Signed   By: Oren Beckmann.D.  On: 10/09/2015 04:00   Dg Chest Port 1 View  10/08/2015  CLINICAL DATA:  Respiratory arrest.  Endotracheal tube placement. EXAM: PORTABLE CHEST 1 VIEW COMPARISON:  CT chest 09/23/2015.  Chest 09/23/2015. FINDINGS: Endotracheal tube has been placed with tip measuring 5 cm above the carina. An enteric tube is present. Tip is off the field of view but below the left hemidiaphragm. Since the previous study, there is extensive volume loss in the left lung with collapse and consolidation of the left upper lung. Compensatory hyperinflation of the right lung. No visible pneumothorax. Normal heart size and pulmonary vascularity. Emphysematous changes and scattered fibrosis in the right lung. Old right rib fractures. IMPRESSION: Appliances appear in satisfactory position. Since the previous study, there is interval volume loss in the left hemi thorax with collapse and consolidation of the left upper lung. Centrally obstructing process or endobronchial lesion should be excluded. Compensatory hyperinflation of the  right lung. Emphysematous changes and scarring on the right. Old right rib fractures. Electronically Signed   By: Lucienne Capers M.D.   On: 10/08/2015 23:28   Dg Abd Portable 1v  10/14/2015  CLINICAL DATA:  Feeding tube placement. EXAM: PORTABLE ABDOMEN - 1 VIEW COMPARISON:  CT of the abdomen pelvis dated 07/14/2015 FINDINGS: The bowel gas pattern is nonobstructive. No radio-opaque calculi or other significant radiographic abnormality are seen. Feeding catheter overlies the expected location of gastric body. IMPRESSION: Feeding catheter overlies expected location of gastric body. Nonobstructive bowel gas pattern. Electronically Signed   By: Fidela Salisbury M.D.   On: 10/14/2015 13:26    Microbiology: No results found for this or any previous visit (from the past 240 hour(s)).   Labs: Basic Metabolic Panel:  Recent Labs Lab 10/14/15 0300 10/15/15 0649 10/16/15 0530 10/17/15 0342 10/18/15 0440 10/19/15 0440  NA 141 145 143 145 143 142  K 4.3 3.2* 2.9* 3.6 4.0 4.0  CL 99* 95* 95* 99* 97* 95*  CO2 34* 42* 42* 41* 40* 40*  GLUCOSE 116* 98 120* 97 92 90  BUN _0 <5* 5*  CREATININE 0.69 0.88 0.77 0.67 0.73 0.71  CALCIUM 8.8* 8.7* 8.3* 8.5* 8.6* 8.7*  MG 2.1 2.0  --  1.9  --   --   PHOS 2.4* 4.0  --   --   --   --    Liver Function Tests: No results for input(s): AST, ALT, ALKPHOS, BILITOT, PROT, ALBUMIN in the last 168 hours. No results for input(s): LIPASE, AMYLASE in the last 168 hours. No results for input(s): AMMONIA in the last 168 hours. CBC:  Recent Labs Lab 10/15/15 0649 10/16/15 0530 10/17/15 0342 10/18/15 0440 10/19/15 0440  WBC 18.1* 13.9* 8.5 8.1 8.0  NEUTROABS  --  10.4*  --   --   --   HGB 12.0* 10.5* 9.7* 10.1* 10.0*  HCT 39.3 32.8* 32.2* 33.0* 33.8*  MCV 94.2 92.1 94.4 94.8 96.3  PLT 491* 441* 439* 466* 482*   Cardiac Enzymes: No results for input(s): CKTOTAL, CKMB, CKMBINDEX, TROPONINI in the last 168 hours. BNP: BNP (last 3  results)  Recent Labs  09/23/15 1842  BNP 143.0*    ProBNP (last 3 results) No results for input(s): PROBNP in the last 8760 hours.  CBG:  Recent Labs Lab 10/16/15 1555 10/16/15 1930 10/16/15 2326 10/17/15 0313 10/17/15 0826  GLUCAP 99 80 78 91 88       Signed:  JESSICA Alison Stalling MD  FACP  [facility] 10/20/2015, 11:59  AM    

## 2015-10-20 NOTE — Progress Notes (Signed)
CSW notified that patient will d/c home with hospice care today.  Message left for [facility] that patient will not return to SNF.  RNCM is aware of home hospice d/c plan. CSW signing off.  Lorie Phenix. Pauline Good, [place]
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
## 2015-10-20 NOTE — Progress Notes (Addendum)
Notified by Jari Favre of family request for [facility]osp[facility] services at home after discharge. Chart and patient information currently under review to confirm hospice eligibility.   Spoke with patient, at bedside and daughter-in law, Sharyn Lull via phone conversation to initiate education related to hospice philosophy, services and team approach to care. Family verbalized understanding of the information provided. Per discussion plan is for discharge to home by personal vehicle with son and daughter-in-law today.   Contacted [facility] to deliver a portable O2 tank to room, since family stated the portable oxygen they had is empty.  Please send signed completed DNR form home with patient.  Patient will need prescriptions for discharge comfort medications.   DME needs discussed and family requested hospital bed, overbed table, tubseat w/back, 3 N 1 for delivery to the home today.  [facility] equipment manager Jewel Ysidro Evert notified and will contact [facility] to arrange delivery to the home.  The home address has been verified and is correct in the chart; Sharyn Lull family member to be contacted to arrange time of delivery.    Completed discharge summary will need to be faxed to [facility] at [phone] when final.  Please notify [facility] when patient is ready to leave unit at discharge-call [phone].   [facility] information and contact numbers have been given to [facility] during visit.    Please call with any questions.  Thank You,  Freddi Starr RN, [facility] Liaison  [phone]

## 2015-10-20 NOTE — Care Management Note (Addendum)
Case Management Note Previous CM note initiated by Elenor Quinones RN, CM  Patient Details  Name: HRISTOPHER HOPMAN MRN: [id number] Date of Birth: 1956/08/31  Subjective/Objective:       Pt admitted with severe sepsis             16:22 Action Plan Update:  CSW spoke with pt and pt stated he wanted to go home. CSW and Attending contacted daughter in law Sharyn Lull regarding pts wishes, daughter in law informed both; she cooks all meals and she feels comfortable with pt going home without 24 hour supervision.  Attending feels that pt could safely discharge home with home hospice giving plan communicated by daughter in law,  CM contacted Sharyn Lull 1625, 1635, 1720 on each number provided on face sheet to provide Home Hospice Choice and left voicemail regarding the need to chose/gain acceptance from chosen agency for service.  CM will continue to monitor for disposition needs  Action/Plan: Pt is from [facility]. Pt will discharge to SNF once medically stable   Expected Discharge Date:     10/20/15             Expected Discharge Plan:  Home w Hospice Care  In-House Referral:  Clinical Social Work  Discharge planning Services  CM Consult  Post Acute Care Choice:  Hospice Choice offered to:  Patient, Adult Children  DME Arranged:  Systems developer C DME Agency:  [facility] Arranged:  [facility] [place] Agency:  [facility]  Status of Service:  Completed, signed off  Medicare Important Message Given:    Date Medicare IM Given:    Medicare IM give by:    Date Additional Medicare IM Given:    Additional Medicare Important Message give by:     If discussed at [place] of Stay Meetings, dates discussed:    Discharge Disposition:  Home with Hospice   Additional Comments:  10/20/15- 1000- Marvetta Gibbons RN, CM- call made to pt's son Ryelan Levengood. At the home number listed- [phone]- discussed d/c plan for home with hospice-  son confirmed plan and stated that he planned to come pick his dad up when ready for discharge today- plan will be that pt will return to his home address- which CM confirmed with son and family will support pt at his home. Pt is active with [facility] for home 02- son also reports that he has a RW at home- not sure that hospital bed is needed at this time- but does feel like shower chair and BSC would be helpful. Choice offered for home hospice agencies of [facility]- per son's would like to use [facility]- referral called into [facility] (spoke with Vickie) who will f/u on referral with plan for d/c home today. Spoke with pt at bedside and let him know that plans were under way to get him home today and that CM had spoken with his son regarding Hospice arrangements- pt voiced appreciation for assistance in d/c planning and states he "just wants to get home today" 1215- update- spoke with Stacie from [facility]- equipment has been arranged with [facility] to bring portable tank to room for discharge- [facility] arrangements in place and Stacie has spoken with family. Called pt's son- Jaxsyn Kimble. And let him know that d/c orders have been placed for his dad- pt could be picked up once they have things ready on their end with equipment at the home. Son stated understanding and plans  to come to pick pt up after lunch.   10/19/15-1745 CM was unable to reach [facility].  CM attempted to offer choice for home hospice list to pt, pt did not appear to comprehend the request.  CM informed pt that she had been unable to reach [facility] by phone, CM asked pt if Sharyn Lull would visit him this evening, pt responded "she is supposed to".  CM told pt that she was going to leave the list and asked him to give/discuss the list with Sharyn Lull, pt simply stated "ok".  CM spoke with bedside nurse post assessment; bedside nurse voiced safety concerns with pt discharging home without 24 hour care/supervision.  CM contacted attending and shared concerns and  assessment, MD acknowledged and will reassess pts competency to make decisions along with support system acceptability for pt to discharge home in current situation with family tomorrow 10/20/15 morning.  CM also communicated safety concern with pt discharging home with CSW.   Dawayne Patricia, RN,  10/20/2015, 10:45 AM

## 2015-12-21 DEATH — deceased
